# Patient Record
Sex: Male | Born: 1945 | Race: White | Hispanic: No | Marital: Married | Smoking: Former smoker
Health system: Southern US, Community
[De-identification: ages and names within clinical notes are randomized; demographics above are authoritative.]

## PROBLEM LIST (undated history)

## (undated) DIAGNOSIS — N529 Male erectile dysfunction, unspecified: Secondary | ICD-10-CM

## (undated) DIAGNOSIS — M109 Gout, unspecified: Secondary | ICD-10-CM

## (undated) DIAGNOSIS — M1990 Unspecified osteoarthritis, unspecified site: Secondary | ICD-10-CM

## (undated) DIAGNOSIS — N189 Chronic kidney disease, unspecified: Secondary | ICD-10-CM

## (undated) DIAGNOSIS — E119 Type 2 diabetes mellitus without complications: Secondary | ICD-10-CM

## (undated) DIAGNOSIS — E785 Hyperlipidemia, unspecified: Secondary | ICD-10-CM

## (undated) DIAGNOSIS — M549 Dorsalgia, unspecified: Secondary | ICD-10-CM

## (undated) DIAGNOSIS — K219 Gastro-esophageal reflux disease without esophagitis: Secondary | ICD-10-CM

## (undated) DIAGNOSIS — IMO0001 Reserved for inherently not codable concepts without codable children: Secondary | ICD-10-CM

## (undated) DIAGNOSIS — G9340 Encephalopathy, unspecified: Secondary | ICD-10-CM

## (undated) DIAGNOSIS — I10 Essential (primary) hypertension: Secondary | ICD-10-CM

## (undated) DIAGNOSIS — F39 Unspecified mood [affective] disorder: Secondary | ICD-10-CM

## (undated) DIAGNOSIS — R413 Other amnesia: Secondary | ICD-10-CM

## (undated) DIAGNOSIS — I639 Cerebral infarction, unspecified: Secondary | ICD-10-CM

## (undated) DIAGNOSIS — I714 Abdominal aortic aneurysm, without rupture: Secondary | ICD-10-CM

## (undated) DIAGNOSIS — M2550 Pain in unspecified joint: Secondary | ICD-10-CM

## (undated) DIAGNOSIS — R911 Solitary pulmonary nodule: Secondary | ICD-10-CM

## (undated) HISTORY — DX: Reserved for inherently not codable concepts without codable children: IMO0001

## (undated) HISTORY — DX: Chronic kidney disease, unspecified: N18.9

## (undated) HISTORY — DX: Unspecified osteoarthritis, unspecified site: M19.90

## (undated) HISTORY — DX: Male erectile dysfunction, unspecified: N52.9

## (undated) HISTORY — DX: Dorsalgia, unspecified: M54.9

## (undated) HISTORY — DX: Type 2 diabetes mellitus without complications: E11.9

## (undated) HISTORY — DX: Pain in unspecified joint: M25.50

## (undated) HISTORY — PX: VASECTOMY: SHX75

## (undated) HISTORY — DX: Abdominal aortic aneurysm, without rupture: I71.4

## (undated) HISTORY — DX: Gastro-esophageal reflux disease without esophagitis: K21.9

## (undated) HISTORY — DX: Cerebral infarction, unspecified: I63.9

## (undated) HISTORY — DX: Solitary pulmonary nodule: R91.1

## (undated) HISTORY — DX: Unspecified mood (affective) disorder: F39

## (undated) HISTORY — DX: Encephalopathy, unspecified: G93.40

## (undated) HISTORY — DX: Gout, unspecified: M10.9

## (undated) HISTORY — DX: Other amnesia: R41.3

## (undated) HISTORY — PX: TIBIA FRACTURE SURGERY: SHX806

## (undated) HISTORY — DX: Hyperlipidemia, unspecified: E78.5

## (undated) HISTORY — DX: Essential (primary) hypertension: I10

## (undated) HISTORY — PX: POLYPECTOMY: SHX149

---

## 1967-07-03 HISTORY — PX: MANDIBLE FRACTURE SURGERY: SHX706

## 1968-07-02 HISTORY — PX: WRIST FRACTURE SURGERY: SHX121

## 2006-07-02 DIAGNOSIS — R911 Solitary pulmonary nodule: Secondary | ICD-10-CM

## 2006-07-02 HISTORY — DX: Solitary pulmonary nodule: R91.1

## 2007-08-19 ENCOUNTER — Ambulatory Visit: Payer: Self-pay | Admitting: Vascular Surgery

## 2008-07-02 HISTORY — PX: COLONOSCOPY: SHX174

## 2008-08-17 ENCOUNTER — Ambulatory Visit: Payer: Self-pay | Admitting: Vascular Surgery

## 2009-08-24 ENCOUNTER — Ambulatory Visit: Payer: Self-pay | Admitting: Vascular Surgery

## 2010-03-10 ENCOUNTER — Ambulatory Visit: Payer: Self-pay | Admitting: Vascular Surgery

## 2010-09-12 ENCOUNTER — Encounter (INDEPENDENT_AMBULATORY_CARE_PROVIDER_SITE_OTHER): Payer: Medicare Other

## 2010-09-12 ENCOUNTER — Ambulatory Visit (INDEPENDENT_AMBULATORY_CARE_PROVIDER_SITE_OTHER): Payer: Medicare Other | Admitting: Vascular Surgery

## 2010-09-12 DIAGNOSIS — I714 Abdominal aortic aneurysm, without rupture, unspecified: Secondary | ICD-10-CM

## 2010-09-12 NOTE — Assessment & Plan Note (Signed)
OFFICE VISIT  RAYBURN, MUNDIS DOB:  03-19-1946                                       09/12/2010 ZOXWR#:[id number]  Patient is a 66 year old male patient followed by me for an infrarenal abdominal aortic aneurysm.  I have followed him for the past 2 years, and the aneurysm has very slowly enlarged.  He denies any abdominal or back symptoms.  Aneurysm was originally picked up on the CT scan done for disk problems, which have subsequently resolved.  CHRONIC MEDICAL PROBLEMS: 1. Hypertension. 2. Hyperlipidemia. 3. History of potential herniated disk, which did not require     treatment. 4. Negative for diabetes, coronary artery disease, COPD, or stroke.  SOCIAL HISTORY:  He is single and has 2 children.  Is retired.  Has not smoked since 1985.  Drinks occasional alcohol.  FAMILY HISTORY:  Positive for coronary artery disease in 2 brothers, diabetes and stroke in his father.  REVIEW OF SYSTEMS:  Negative for chest pain, dyspnea on exertion, PND, orthopnea.  Does have reflux esophagitis, arthritis, and joint pain. All other systems were negative for the complete review of systems.  PHYSICAL EXAMINATION:  Blood pressure 148/80, heart rate 78, respirations 14.  General:  He is a well-developed and well-nourished male in no apparent distress, alert and oriented x3.  HEENT:  Normal for age.  EOMs intact.  Lungs:  Clear to auscultation.  No rhonchi or wheezing.  Cardiovascular:  Regular rhythm.  No murmurs.  Carotid pulses are 3+.  No audible bruits.  Abdomen:  Soft, nontender.  No pulsatile masses noted.  Musculoskeletal:  Free of major deformities.  Neurologic: Normal.  Lower extremity exam reveals 3+ femoral and popliteal pulses palpable bilaterally.  Today I ordered an abdominal duplex scan which I have reviewed and interpreted.  This reveals the aneurysm to have a maximal diameter of 4.6 cm compared to 4.3 cm in September 2011.  The aneurysm is slowly  enlarging, and we will need to keep an eye on it. It does not require treatment at the present time.  He will return in 6 months for a duplex scan of his aneurysm to check for further enlargement.    Quita Skye Hart [place], M.D. Electronically Signed  JDL/MEDQ  D:  09/12/2010  T:  09/12/2010  Job:  [id number]  cc:   Dr. Zachery Dauer

## 2010-09-18 NOTE — Procedures (Unsigned)
DUPLEX ULTRASOUND OF ABDOMINAL AORTA  INDICATION:  Abdominal aortic aneurysm.  HISTORY: Diabetes:  No. Cardiac:  No. Hypertension:  Yes. Smoking:  Previous. Connective Tissue Disorder: Family History:  No. Previous Surgery:  No.  DUPLEX EXAM:         AP (cm)                   TRANSVERSE (cm) Proximal             Not visualized            Not visualized Mid                  2.4 cm                    2.8 cm Distal               4.6 cm                    4.6 cm Right Iliac          1.4 cm                    1.3 cm Left Iliac           1.4 cm                    1.4 cm  PREVIOUS:  Date:  03/10/2010  AP:  4.26  TRANSVERSE:  4.33  IMPRESSION: 1. Aneurysmal dilatation of the distal abdominal aorta with no     significant change in maximum diameter when compared to the     previous exam. 2. Unable to adequately visualize the proximal abdominal aorta due to     overlying bowel gas patterns.  ___________________________________________ Quita Skye Hart [place], M.D.  CH/MEDQ  D:  09/12/2010  T:  09/12/2010  Job:  [id number]

## 2010-11-14 NOTE — Procedures (Signed)
DUPLEX ULTRASOUND OF ABDOMINAL AORTA   INDICATION:  Followup abdominal aortic aneurysm.   HISTORY:  Diabetes:  No.  Cardiac:  No.  Hypertension:  Yes.  Smoking:  Quit.  Connective Tissue Disorder:  Family History:  No.  Previous Surgery:  No.   DUPLEX EXAM:         AP (cm)                   TRANSVERSE (cm)  Proximal             1.69 cm                   1.73 cm  Mid                  4.22 cm                   4.19 cm  Distal               2.45 cm                   2.54 cm  Right Iliac          1.25 cm                   0.98 cm  Left Iliac           1.21 cm                   1.26 cm   PREVIOUS:  Date:  AP:  3.87  TRANSVERSE:  3.95   IMPRESSION:  There appears to be abdominal aortic aneurysm noted with  largest measurement of 4.22 cm x 4.19 cm.   ___________________________________________  Quita Skye Hart [place], M.D.   CB/MEDQ  D:  08/24/2009  T:  08/24/2009  Job:  [id number]

## 2010-11-14 NOTE — Procedures (Signed)
DUPLEX ULTRASOUND OF ABDOMINAL AORTA   INDICATION:  Follow up abdominal aortic aneurysm.   HISTORY:  Diabetes:  No.  Cardiac:  No.  Hypertension:  Yes.  Smoking:  Quit 20 years ago.  Connective Tissue Disorder:  Family History:  No.  Previous Surgery:  No.   DUPLEX EXAM:         AP (cm)                   TRANSVERSE (cm)  Proximal             1.86 cm                   1.98 cm  Mid                  3.87 cm                   3.95 cm  Distal               2.96 cm                   3.20 cm  Right Iliac          1.34 cm                   1.33 cm  Left Iliac           1.61 cm                   1.56 cm   PREVIOUS:  Date: 07/02/07 (CT)  AP:  3.9  TRANSVERSE:   IMPRESSION:  Stable abdominal aortic aneurysm measuring 3.87 cm X 3.95  cm.   _______________________________________   [name] [name], M.D.   AS/MEDQ  D:  08/17/2008  T:  08/17/2008  Job:  [id number]

## 2010-11-14 NOTE — Consult Note (Signed)
VASCULAR SURGERY CONSULTATION   [name], [name]  DOB:  07/25/1945                                       08/19/2007  ZOXWR#:[id number]   Patient is a 65 year old healthy male patient who was referred for  vascular consultation by Dr. Welton [name] for abdominal aortic aneurysm, which  was recently discovered on CT scanning.  Since November, patient had  upper back discomfort which would occur without notice and was quite  severe in nature.  This would sometimes occur while sitting or when he  arose, but he could do other vigorous activity with no pain.  Workup  included a CT scan, which did reveal some herniation of the L4-5 disk  area and also revealed a 3.9 cm infrarenal abdominal aortic aneurysm.  Subsequently, his back discomfort has resolved, and he is having no  symptoms at this time.  He has no history of lumbar disk disease,  radiculopathy, or vascular problems.   PAST MEDICAL HISTORY:  1. Hypertension.  2. Hyperlipidemia.  3. Questionable L4-5 disk herniation on recent CT scan, negative for      coronary artery disease, diabetes, COPD, or stroke.   PAST SURGICAL HISTORY:  None.   FAMILY HISTORY:  Positive for coronary artery disease in his mother and  brother, who died at age 16 with a myocardial infarction.  Also,  positive for stroke in his father and diabetes in his sister and his  father.   SOCIAL HISTORY:  He is single.  Has two children.  Works as a Programmer, systems.  He has not smoked since 1989.  Drinks 1-2 alcoholic  beverages per day.   REVIEW OF SYSTEMS:  Totally unremarkable with the exception of some  reflux esophagitis.   ALLERGIES:  None known.   MEDICATIONS:  1. Crestor 20 mg 1 daily.  2. Nexium 40 mg 1 daily.  3. Furosemide 40 mg 1 daily.  4. Metoprolol tartrate 50 mg 1 tablet 2 times per day.   PHYSICAL EXAMINATION:  Blood pressure 142/86, heart rate 60,  respirations 18.  Generally, he is a healthy-appearing middle-aged male  in  no apparent distress.  Alert and oriented x3.  His neck is supple  with 3+ carotid pulses palpable.  No bruits are audible.  Neurologic  exam is normal.  No palpable adenopathy in the neck.  Upper extremity  pulses are 3+ bilaterally.  Chest:  Clear to auscultation.  Cardiovascular:  Regular rhythm with no murmurs.  No skin rash is noted.  Abdomen is soft and nontender with a small pulsatile mass in the mid  epigastrium.  No organomegaly is noted.  He has 3+ femoral, popliteal,  and dorsalis pedis pulses palpable bilaterally.  There is no distal  edema.   I reviewed the CT scan performed at [facility] on 07/02/07, and  he does have an infrarenal abdominal aortic aneurysm measuring about 4  cm in maximum diameter.  It does appear to have an adequate neck below  the renals for stent grafting.  If that should occur, need to be done in  the future.   I discussed with him the fact that we will see him back in one year with  a follow-up duplex scan of his aneurysm in our office for followup.  If  he has any severe back or abdominal pain, he will report to the  emergency room.   Quita Skye Hart [place], M.D.  Electronically Signed  JDL/MEDQ  D:  08/19/2007  T:  08/20/2007  Job:  [id number]   cc:   Dr. Luna [name], [name] [name]

## 2010-11-14 NOTE — Assessment & Plan Note (Signed)
OFFICE VISIT   [name] [name], [name] [name]  DOB:  1946/05/01                                       08/17/2008  AOZHY#:[id number]   The patient returns today for followup regarding his abdominal aortic  aneurysm which I evaluated a year ago.  It was 3.9 cm in maximum  diameter by CT scan in December of 2008.  He has had no abdominal or  back symptoms in the interim.  He also denies any chest pain, dyspnea on  exertion, PND, orthopnea, hemiparesis, aphasia, amaurosis fugax,  diplopia, blurred vision, syncope or claudication.  He does not take  aspirin or other medications other than Prilosec.   PHYSICAL EXAM:  Vital signs:  Blood pressure is 146/88, heart rate 61,  respirations 14.  Neck:  His carotid pulse is 3+, no audible bruits.  Neurological:  Normal.  Chest:  Clear to auscultation.  Cardiovascular:  Regular rhythm, no murmurs.  Abdomen:  Soft, nontender, no pulsatile  masses palpable.  He has 3+ femoral and posterior tibial pulses are  palpable at 3+.   Duplex scan of his aneurysm reveals no change since last year measuring  3.95 cm in maximum diameter.   His aneurysm is unchanged, does not require any treatment at this time.  We will see him back on our protocol scan in 1 year to check for any  enlargement unless he develops any symptoms in the interim.   Quita Skye Hart [place], M.D.  Electronically Signed   JDL/MEDQ  D:  08/17/2008  T:  08/18/2008  Job:  [id number]   cc:   Dr Luna Kitchens

## 2010-11-14 NOTE — Procedures (Signed)
DUPLEX ULTRASOUND OF ABDOMINAL AORTA   INDICATION:  Followup of known abdominal aortic aneurysm.   HISTORY:  Diabetes:  No  Cardiac:  No  Hypertension:  Yes  Smoking:  Previous  Family History:  Unknown  Previous Surgery:  None   DUPLEX EXAM:         AP (cm)                   TRANSVERSE (cm)  Proximal             1.86 cm                   1.89 cm  Mid                  4.26 cm                   4.33 cm  Distal               1.96 cm                   1.99 cm  Right Iliac          1.19 cm                   1.01 cm  Left Iliac           1.22 cm   PREVIOUS:  Date: 08/24/2009  AP:  4.22  TRANSVERSE:  4.19   IMPRESSION:  1. Abdominal aortic aneurysm with maximum diameter of 4.26 cm x 4.33      cm.  2. No significant change from previous exam of 08/24/2009.   ___________________________________________  Quita Skye. Hart [place], M.D.   RS/MEDQ  D:  03/13/2010  T:  03/13/2010  Job:  [id number]

## 2011-03-15 ENCOUNTER — Encounter (INDEPENDENT_AMBULATORY_CARE_PROVIDER_SITE_OTHER): Payer: Medicare Other | Admitting: *Deleted

## 2011-03-15 DIAGNOSIS — I714 Abdominal aortic aneurysm, without rupture: Secondary | ICD-10-CM

## 2011-03-21 ENCOUNTER — Encounter: Payer: Self-pay | Admitting: Vascular Surgery

## 2011-03-21 NOTE — Procedures (Unsigned)
DUPLEX ULTRASOUND OF ABDOMINAL AORTA  INDICATION:  Abdominal aortic aneurysm.  HISTORY: Diabetes:  No. Cardiac:  No. Hypertension:  Yes. Smoking:  Previous. Connective Tissue Disorder: Family History:  No. Previous Surgery:  No.  DUPLEX EXAM:         AP (cm)                   TRANSVERSE (cm) Proximal             2.9 cm                    2.97 cm Mid                  Not visualized            Not visualized Distal               4.7 cm                    4.7 cm Right Iliac          1.4 cm                    1.3 cm Left Iliac           1.3 cm                    1.2 cm  PREVIOUS:  Date: 09/12/2010  AP:  4.6  TRANSVERSE:  4.6  IMPRESSION: 1. Aneurysmal dilatation of the distal abdominal aorta with no     significant change in maximum diameter when compared to the     previous examination. 2. Unable to adequately visualize the mid abdominal aorta due to     overlying bowel gas patterns.  ___________________________________________ Quita Skye Hart Rochester, M.D.  CH/MEDQ  D:  03/15/2011  T:  03/15/2011  Job:  [id number]

## 2011-03-22 ENCOUNTER — Other Ambulatory Visit: Payer: Self-pay | Admitting: Vascular Surgery

## 2011-03-22 DIAGNOSIS — I714 Abdominal aortic aneurysm, without rupture: Secondary | ICD-10-CM

## 2011-09-12 ENCOUNTER — Encounter (INDEPENDENT_AMBULATORY_CARE_PROVIDER_SITE_OTHER): Payer: Medicare Other | Admitting: *Deleted

## 2011-09-12 DIAGNOSIS — I714 Abdominal aortic aneurysm, without rupture: Secondary | ICD-10-CM

## 2011-09-19 ENCOUNTER — Other Ambulatory Visit: Payer: Self-pay | Admitting: *Deleted

## 2011-09-19 DIAGNOSIS — I714 Abdominal aortic aneurysm, without rupture: Secondary | ICD-10-CM

## 2011-09-21 ENCOUNTER — Encounter: Payer: Self-pay | Admitting: Vascular Surgery

## 2011-09-21 NOTE — Procedures (Unsigned)
DUPLEX ULTRASOUND OF ABDOMINAL AORTA  INDICATION:  Follow up abdominal aortic aneurysm.  HISTORY: Diabetes:  No. Cardiac:  No. Hypertension:  Yes. Smoking:  Previously. Connective Tissue Disorder: Family History: Previous Surgery:  No.  DUPLEX EXAM:         AP (cm)                   TRANSVERSE (cm) Proximal             1.8 cm Mid                  4.6 cm                    4.6 cm Distal               2.2 cm                    1.8 cm Right Iliac          1.1 cm                    0.98 cm Left Iliac           1.3 cm                    1.3 cm  PREVIOUS:  Date: 03/15/11  AP:  4.7  TRANSVERSE:  4.7  IMPRESSION: 1. Aneurysmal dilatation of the mid to distal aorta with no     significant change since the prior study of 03/15/2011. 2. This was somewhat of a technically difficult examination due to     overlying bowel gas.  ___________________________________________ Quita Skye. Hart Rochester, M.D.  SS/MEDQ  D:  09/12/2011  T:  09/12/2011  Job:  [id number]

## 2012-03-11 ENCOUNTER — Encounter: Payer: Self-pay | Admitting: Neurosurgery

## 2012-03-14 ENCOUNTER — Ambulatory Visit: Payer: Medicare Other | Admitting: Neurosurgery

## 2012-03-17 ENCOUNTER — Encounter: Payer: Self-pay | Admitting: Neurosurgery

## 2012-03-18 ENCOUNTER — Ambulatory Visit (INDEPENDENT_AMBULATORY_CARE_PROVIDER_SITE_OTHER): Payer: Medicare Other | Admitting: Neurosurgery

## 2012-03-18 ENCOUNTER — Other Ambulatory Visit: Payer: Self-pay | Admitting: *Deleted

## 2012-03-18 ENCOUNTER — Encounter (INDEPENDENT_AMBULATORY_CARE_PROVIDER_SITE_OTHER): Payer: Medicare Other | Admitting: *Deleted

## 2012-03-18 ENCOUNTER — Encounter: Payer: Self-pay | Admitting: Neurosurgery

## 2012-03-18 VITALS — BP 159/90 | HR 63 | Resp 16 | Ht 69.0 in | Wt 199.0 lb

## 2012-03-18 DIAGNOSIS — I714 Abdominal aortic aneurysm, without rupture, unspecified: Secondary | ICD-10-CM | POA: Insufficient documentation

## 2012-03-18 HISTORY — DX: Abdominal aortic aneurysm, without rupture, unspecified: I71.40

## 2012-03-18 HISTORY — DX: Abdominal aortic aneurysm, without rupture: I71.4

## 2012-03-18 NOTE — Progress Notes (Signed)
[facility] [place] AAA/PAD/PVD Office Note  CC: Six-month surveillance of known AAA Referring Physician: Hart Rochester  History of Present Illness: 66 year old male patient of Dr. Hart Rochester is followed for reevaluation of known AAA. The patient denies any unusual abdominal or back pain. The patient does give a history of having back pain and had an MRI for the back pain which revealed a AAA but states he has not had further back issues.  Past Medical History  Diagnosis Date  . AAA (abdominal aortic aneurysm)   . Hypertension   . Hyperlipidemia   . Reflux   . Arthritis     Gout  . Back pain     ROS: [x]  Positive   [ ]  Denies    General: [ ]  Weight loss, [ ]  Fever, [ ]  chills Neurologic: [ ]  Dizziness, [ ]  Blackouts, [ ]  Seizure [ ]  Stroke, [ ]  "Mini stroke", [ ]  Slurred speech, [ ]  Temporary blindness; [ ]  weakness in arms or legs, [ ]  Hoarseness Cardiac: [ ]  Chest pain/pressure, [ ]  Shortness of breath at rest [ ]  Shortness of breath with exertion, [ ]  Atrial fibrillation or irregular heartbeat Vascular: [ ]  Pain in legs with walking, [ ]  Pain in legs at rest, [ ]  Pain in legs at night,  [ ]  Non-healing ulcer, [ ]  Blood clot in vein/DVT,   Pulmonary: [ ]  Home oxygen, [ ]  Productive cough, [ ]  Coughing up blood, [ ]  Asthma,  [ ]  Wheezing Musculoskeletal:  [ ]  Arthritis, [ ]  Low back pain, [ ]  Joint pain Hematologic: [ ]  Easy Bruising, [ ]  Anemia; [ ]  Hepatitis Gastrointestinal: [ ]  Blood in stool, [ ]  Gastroesophageal Reflux/heartburn, [ ]  Trouble swallowing Urinary: [ ]  chronic Kidney disease, [ ]  on HD - [ ]  MWF or [ ]  TTHS, [ ]  Burning with urination, [ ]  Difficulty urinating Skin: [ ]  Rashes, [ ]  Wounds Psychological: [ ]  Anxiety, [ ]  Depression   Social History History  Substance Use Topics  . Smoking status: Former Smoker    Types: Cigarettes    Quit date: 07/03/1983  . Smokeless tobacco: Not on file  . Alcohol Use: Yes    Family History Family  History  Problem Relation Age of Onset  . Diabetes Father   . Stroke Father   . Heart attack Brother     X's 2  . Coronary artery disease Mother   . Diabetes Sister     Not on File  Current Outpatient Prescriptions  Medication Sig Dispense Refill  . allopurinol (ZYLOPRIM) 100 MG tablet Take 100 mg by mouth daily.      . furosemide (LASIX) 40 MG tablet Take 40 mg by mouth daily.      Marland Kitchen HYDROcodone-acetaminophen (NORCO/VICODIN) 5-325 MG per tablet continuous as needed.      . metoprolol succinate (TOPROL-XL) 50 MG 24 hr tablet Take 50 mg by mouth daily. Take with or immediately following a meal.      . METOPROLOL TARTRATE PO Take 50 mg by mouth daily.      Marland Kitchen omeprazole (PRILOSEC) 20 MG capsule Take 20 mg by mouth daily.      Marland Kitchen PRILOSEC OTC 20 MG tablet daily.      . rosuvastatin (CRESTOR) 20 MG tablet Take 20 mg by mouth daily.        Physical Examination  Filed Vitals:   03/18/12 0917  BP: 159/90  Pulse: 63  Resp: 16    Body  mass index is 29.39 kg/(m^2).  General:  WDWN in NAD Gait: Normal HEENT: WNL Eyes: Pupils equal Pulmonary: normal non-labored breathing , without Rales, rhonchi,  wheezing Cardiac: RRR, without  Murmurs, rubs or gallops; No carotid bruits Abdomen: soft, NT, no masses Skin: no rashes, ulcers noted Vascular Exam/Pulses: 2+ radial pulses bilaterally, palpable femoral pulses bilaterally, mild palpable abdominal pulsatile mass  Extremities without ischemic changes, no Gangrene , no cellulitis; no open wounds;  Musculoskeletal: no muscle wasting or atrophy  Neurologic: A&O X 3; Appropriate Affect ; SENSATION: normal; MOTOR FUNCTION:  moving all extremities equally. Speech is fluent/normal  Non-Invasive Vascular Imaging: AAA duplex shows a maximum diameter of 4.56 x 4.57 which is virtually unchanged from 4.6 in March 2013  ASSESSMENT/PLAN: Asymptomatic patient with known AAA which is unchanged from previous exam. The patient will followup in 6 months  with repeat AAA duplex. The patient's questions were encouraged and answered, he is in agreement with this plan. Sharlee Blew ANP  Clinic M.D.: Hart Rochester

## 2012-03-19 ENCOUNTER — Ambulatory Visit: Payer: Medicare Other | Admitting: Neurosurgery

## 2012-09-16 ENCOUNTER — Ambulatory Visit: Payer: Medicare Other | Admitting: Neurosurgery

## 2012-09-16 ENCOUNTER — Encounter (INDEPENDENT_AMBULATORY_CARE_PROVIDER_SITE_OTHER): Payer: Medicare Other | Admitting: *Deleted

## 2012-09-16 DIAGNOSIS — I714 Abdominal aortic aneurysm, without rupture: Secondary | ICD-10-CM

## 2012-09-17 ENCOUNTER — Encounter: Payer: Self-pay | Admitting: Vascular Surgery

## 2012-09-17 ENCOUNTER — Other Ambulatory Visit: Payer: Self-pay

## 2012-09-17 DIAGNOSIS — I714 Abdominal aortic aneurysm, without rupture: Secondary | ICD-10-CM

## 2013-03-31 ENCOUNTER — Other Ambulatory Visit: Payer: Medicare Other

## 2013-03-31 ENCOUNTER — Ambulatory Visit: Payer: Medicare Other | Admitting: Vascular Surgery

## 2013-04-06 ENCOUNTER — Encounter: Payer: Self-pay | Admitting: Vascular Surgery

## 2013-04-07 ENCOUNTER — Ambulatory Visit (INDEPENDENT_AMBULATORY_CARE_PROVIDER_SITE_OTHER): Payer: Medicare Other | Admitting: Vascular Surgery

## 2013-04-07 ENCOUNTER — Ambulatory Visit (HOSPITAL_COMMUNITY)
Admission: RE | Admit: 2013-04-07 | Discharge: 2013-04-07 | Disposition: A | Discharge status: Home / Self Care | Payer: Medicare Other | Source: Ambulatory Visit | Attending: Vascular Surgery | Admitting: Vascular Surgery

## 2013-04-07 ENCOUNTER — Encounter: Payer: Self-pay | Admitting: Vascular Surgery

## 2013-04-07 VITALS — BP 157/98 | HR 69 | Resp 16 | Ht 69.0 in | Wt 200.0 lb

## 2013-04-07 DIAGNOSIS — I714 Abdominal aortic aneurysm, without rupture, unspecified: Secondary | ICD-10-CM | POA: Insufficient documentation

## 2013-04-07 DIAGNOSIS — Z01810 Encounter for preprocedural cardiovascular examination: Secondary | ICD-10-CM

## 2013-04-07 HISTORY — DX: Abdominal aortic aneurysm, without rupture, unspecified: I71.40

## 2013-04-07 HISTORY — DX: Abdominal aortic aneurysm, without rupture: I71.4

## 2013-04-07 NOTE — Addendum Note (Signed)
Addended by: Sharee Pimple on: 04/07/2013 12:40 PM   Modules accepted: Orders

## 2013-04-07 NOTE — Progress Notes (Signed)
Subjective:     Patient ID: [name] [name], male   DOB: 05-11-1946, 67 y.o.   MRN: [id number]  HPI this 67 year old male returns for continued followup regarding his abnormal aortic aneurysm which I have been following for about 3 years. It was discovered on a CT scan when he was being evaluated for lumbar spine disease. Most recent diameter was 4.69 cm in maximum diameter. Today he had a duplex scan in our office which I have reviewed and interpreted. It reveals that the aneurysm is now 5.2 x 5.1 cm in maximum diameter. He denies any new symptoms of abdominal or back discomfort. He has no history of coronary artery disease and has not smoked since 1985.  Past Medical History  Diagnosis Date  . AAA (abdominal aortic aneurysm)   . Hypertension   . Hyperlipidemia   . Reflux   . Arthritis     Gout  . Back pain     History  Substance Use Topics  . Smoking status: Former Smoker    Types: Cigarettes    Quit date: 07/03/1983  . Smokeless tobacco: Not on file  . Alcohol Use: Yes    Family History  Problem Relation Age of Onset  . Diabetes Father   . Stroke Father   . Heart attack Brother     X's 2  . Coronary artery disease Mother   . Diabetes Sister     No Known Allergies  Current outpatient prescriptions:allopurinol (ZYLOPRIM) 100 MG tablet, Take 100 mg by mouth daily., Disp: , Rfl: ;  furosemide (LASIX) 40 MG tablet, Take 40 mg by mouth daily., Disp: , Rfl: ;  HYDROcodone-acetaminophen (NORCO/VICODIN) 5-325 MG per tablet, continuous as needed., Disp: , Rfl: ;  metoprolol succinate (TOPROL-XL) 50 MG 24 hr tablet, Take 50 mg by mouth daily. Take with or immediately following a meal., Disp: , Rfl:  METOPROLOL TARTRATE PO, Take 50 mg by mouth daily., Disp: , Rfl: ;  omeprazole (PRILOSEC) 20 MG capsule, Take 20 mg by mouth daily., Disp: , Rfl: ;  PRILOSEC OTC 20 MG tablet, daily., Disp: , Rfl: ;  rosuvastatin (CRESTOR) 20 MG tablet, Take 20 mg by mouth daily., Disp: , Rfl:   BP 157/98   Pulse 69  Resp 16  Ht 5\' 9"  (1.753 m)  Wt 200 lb (90.719 kg)  BMI 29.52 kg/m2  Body mass index is 29.52 kg/(m^2).        Review of Systems denies chest pain, dyspnea on exertion, PND, orthopnea, hemoptysis, claudication. All other systems negative and a complete review of systems    Objective:   Physical Exam BP 157/98  Pulse 69  Resp 16  Ht 5\' 9"  (1.753 m)  Wt 200 lb (90.719 kg)  BMI 29.52 kg/m2  Gen.-alert and oriented x3 in no apparent distress HEENT normal for age Lungs no rhonchi or wheezing Cardiovascular regular rhythm no murmurs carotid pulses 3+ palpable no bruits audible Abdomen soft nontender -5 cm pulsatile mass palpable-nontender Musculoskeletal free of  major deformities Skin clear -no rashes Neurologic normal Lower extremities 3+ femoral and dorsalis pedis pulses palpable bilaterally with no edema  Today I reviewed the duplex scan which reveals a continued enlargement of the aneurysm to her current diameter of 5.2 cm in maximum diameter       Assessment:     5.2 cm infrarenal abdominal aortic aneurysm     Plan:     #1 CT angiogram of abdomen and pelvis to see if patient stent graft candidate #  2 Will order Cardiolite nuclear stress test-patient has no history of coronary artery disease #3 return in 2 weeks to discuss options regarding treatment

## 2013-04-10 ENCOUNTER — Telehealth: Payer: Self-pay | Admitting: Vascular Surgery

## 2013-04-10 NOTE — Telephone Encounter (Signed)
notified patient of cardiolyte scheduled at [facility], [place], on 04-16-13 9 am, and cta abd/pelvis, [place], 04-21-13 at 11:30, then fu with dr. Hart Rochester at 12:30

## 2013-04-20 ENCOUNTER — Encounter: Payer: Self-pay | Admitting: Vascular Surgery

## 2013-04-21 ENCOUNTER — Ambulatory Visit (INDEPENDENT_AMBULATORY_CARE_PROVIDER_SITE_OTHER): Payer: Medicare Other | Admitting: Vascular Surgery

## 2013-04-21 ENCOUNTER — Ambulatory Visit
Admission: RE | Admit: 2013-04-21 | Discharge: 2013-04-21 | Disposition: A | Discharge status: Home / Self Care | Payer: Medicare Other | Source: Ambulatory Visit | Attending: Vascular Surgery | Admitting: Vascular Surgery

## 2013-04-21 ENCOUNTER — Encounter: Payer: Self-pay | Admitting: Vascular Surgery

## 2013-04-21 VITALS — BP 161/102 | HR 70 | Resp 18 | Ht 69.0 in | Wt 195.0 lb

## 2013-04-21 DIAGNOSIS — I714 Abdominal aortic aneurysm, without rupture, unspecified: Secondary | ICD-10-CM

## 2013-04-21 IMAGING — CT CT CTA ABD/PEL W/CM AND/OR W/O CM
1 of 7 series · 10 of 36 positions shown, 15 images · IV contrast (80CC OMNI 350)
Comparison: Abdominal aortic ultrasound - [DATE];

CLINICAL DATA: Evaluate abdominal aortic aneurysm, pre stent
evaluation

CT ANGIOGRAPHY ABDOMEN AND PELVIS
TECHNIQUE: Multidetector CT imaging of the abdomen and pelvis was
performed using the standard protocol during bolus administration
of intravenous contrast.  Multiplanar reconstructed images
including MIPs were obtained and reviewed to evaluate the vascular
anatomy.
Contrast: 80mL OMNIPAQUE IOHEXOL 350 MG/ML SOLN

[Series 3: angio · axial · 0.86mm/px · z∈[-405,+15]mm · 10 of 197 slices shown, 15 images]
[im 15/197  soft-tissue]
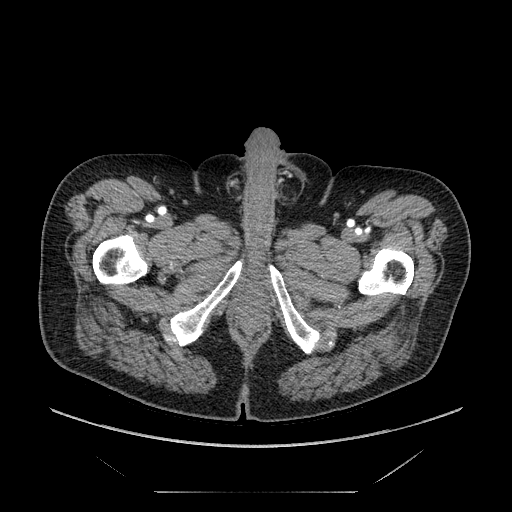
[im 15/197  bone]
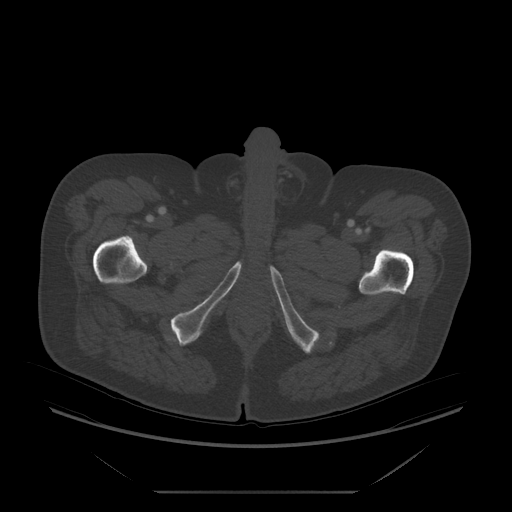
[im 43/197  soft-tissue]
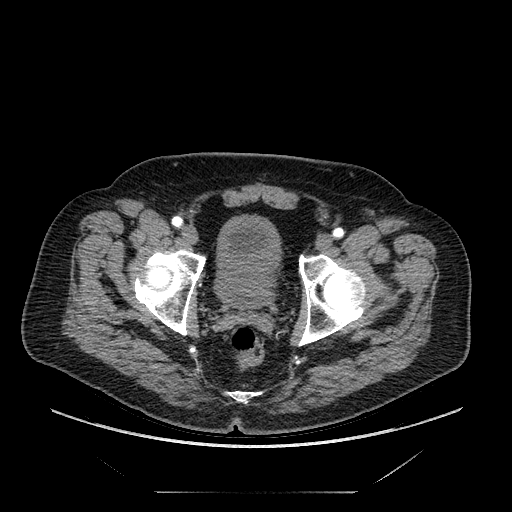
[im 57/197  soft-tissue]
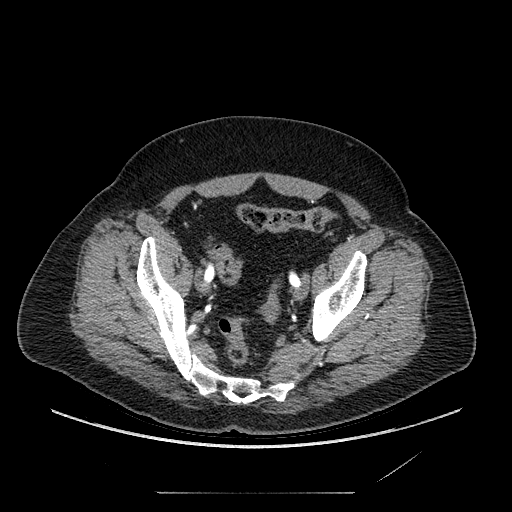
[im 85/197  soft-tissue]
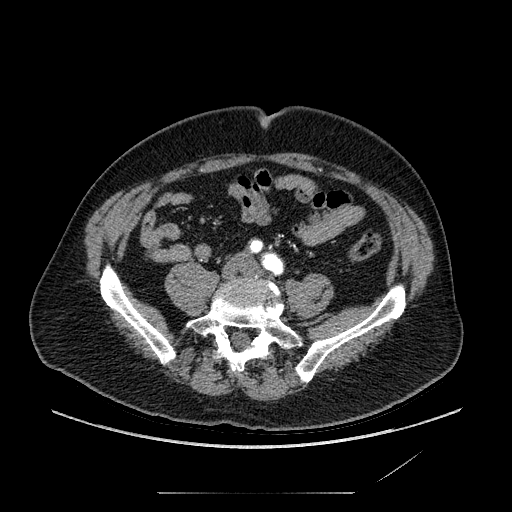
[im 99/197  soft-tissue]
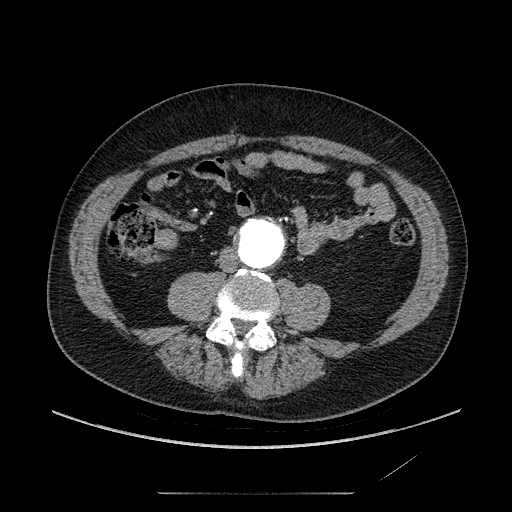
[im 113/197  soft-tissue]
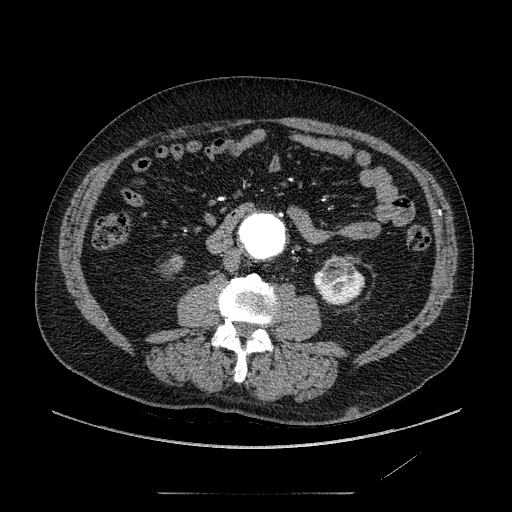
[im 141/197  soft-tissue]
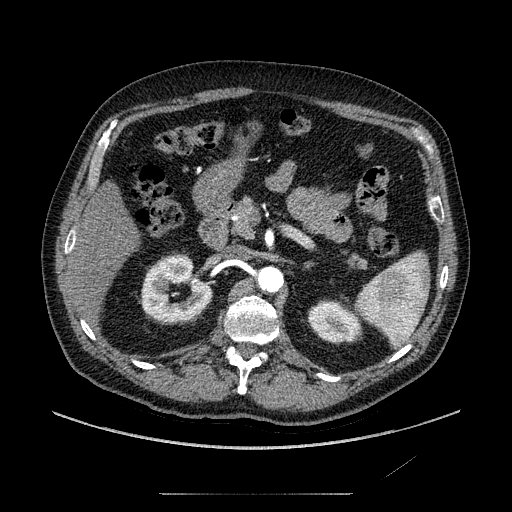
[im 141/197  lung]
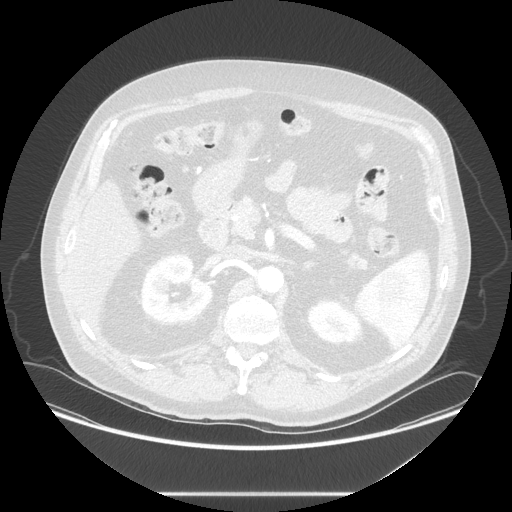
[im 155/197  soft-tissue]
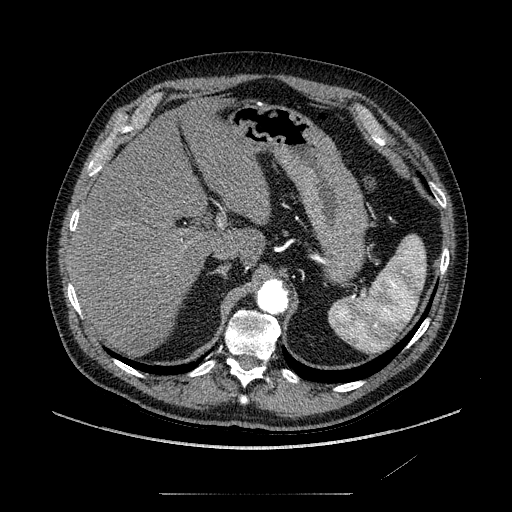
[im 155/197  lung]
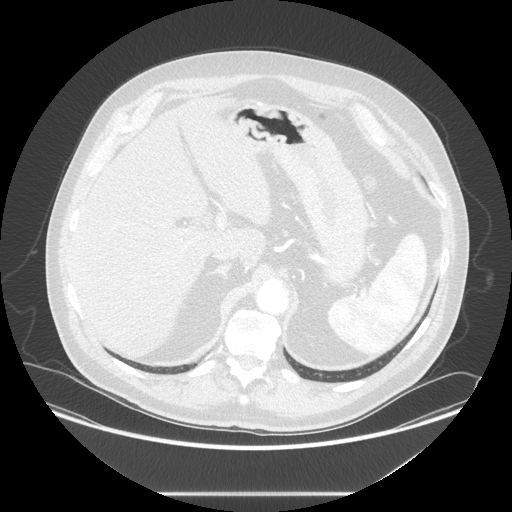
[im 169/197  lung]
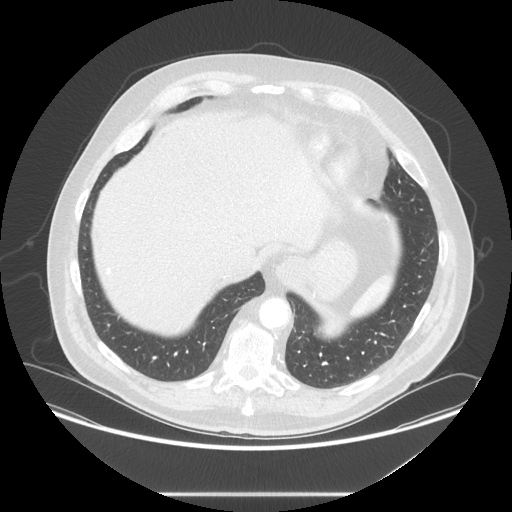
[im 183/197  soft-tissue]
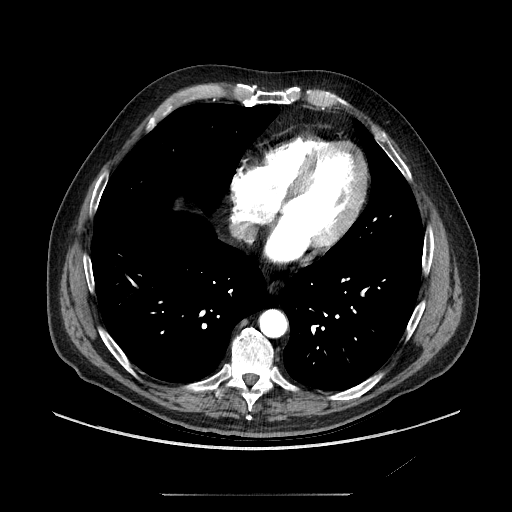
[im 183/197  lung]
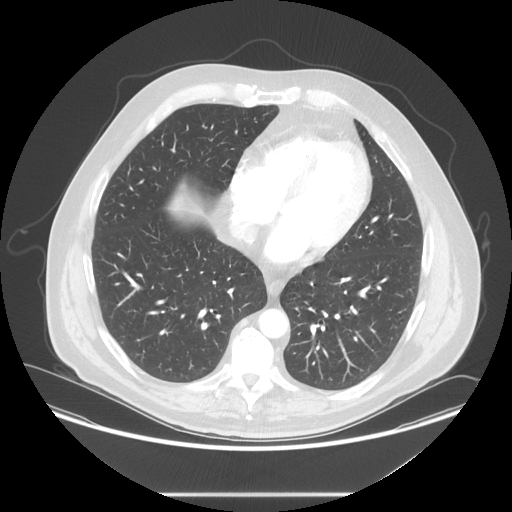
[im 183/197  bone]
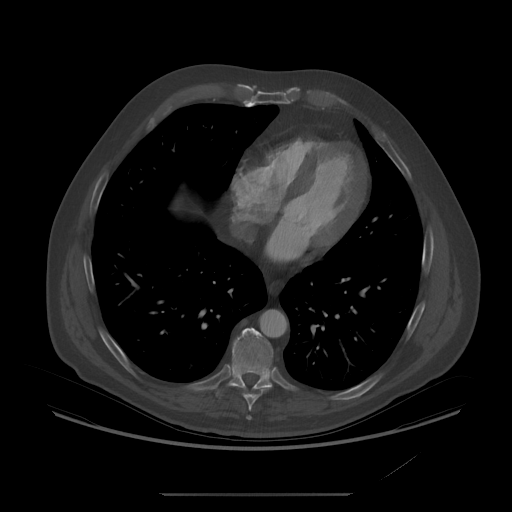

[10 of 36 positions shown; findings below may reference images not displayed]

abdominal CT
- [DATE]

Vascular Findings:

Thoracic aorta:  There is eccentric irregular largely noncalcified
plaque within the imaged distal aspect of the thoracic aorta, not
resulting in hemodynamically significant stenosis.  The imaged
distal aspect of the thoracic aorta is of normal caliber.

Abdominal aorta: Since prior abdominal CT performed [DATE], there
has been interval increase in size of infrarenal abdominal aortic
aneurysm, now measuring approximately 5.6 x 5.3 cm in greatest
oblique short axis axial dimension (image 94, series 3),
previously, 4.1 x 3.8 cm.  The abdominal aortic aneurysm now
measures approximately 5.3 cm in greatest oblique coronal dimension
(coronal image 67, series 601), previously, 3.7 cm.

The abdominal aortic aneurysm is noted to originate approximately
3.5 cm caudal to the takeoff of the most inferior left sided renal
artery and extend to the level of the aortic bifurcation.

There is eccentric largely noncalcified plaque within the abdominal
aorta, not resulting in hemodynamically significant stenosis.  No
abdominal aortic dissection or periaortic stranding. Several shoddy
retroperitoneal lymph nodes are noted about the abdominal aorta.

Celiac artery:  There is a minimal amount of eccentric
atherosclerotic plaque involving the cranial aspect of the origin
of celiac artery, not resulting in hemodynamically significant
stenosis.  Conventional branching pattern.

SMA:  There is a very minimal amount of primarily noncalcified
atherosclerotic plaque involving the cranial aspect of the origin
of the SMA, not resulting in hemodynamically significant stenosis.
A replaced right hepatic artery is noted to arise from the proximal
SMA.

Right renal artery:  Solitary; there is a mixture of calcified and
noncalcified atherosclerotic plaque involving the right renal
artery, not resulting in hemodynamically significant stenosis.
There is mild ectasia of the distal aspect of the right renal
artery regional to its bifurcation measuring approximately 1.1 x
0.9 cm in greatest oblique coronal dimension (image 81, series
601).  This finding without associated right-sided renal cortical
atrophy.

Left renal artery:  Previously duplicated as of a tiny accessory
left sided renal artery has thrombosed with associated geographic
atrophy involving the anterior inferior aspect of the left kidney.
There is a minimal amount of mixed calcified and noncalcified
atherosclerotic plaque involving the origin of the dominant right
sided renal artery, not resulting in hemodynamically significant
stenosis.

IMA:  Remains patent.

Right-sided pelvic vasculature: There is a very minimal amount of
eccentric mixed calcified and noncalcified plaque within the right
common iliac artery, not resulting in hemodynamically significant
stenosis. The right common iliac artery is a normal caliber.  The
right external iliac arteries tortuous but widely patent and free
of clinically significant narrowing. There is scattered
atherosclerotic plaque within a patent and normal caliber right
internal iliac artery.

Left-sided pelvic vasculature: There is a very minimal amount of
eccentric largely noncalcified atherosclerotic plaque scattered
within the normal caliber left common iliac artery, not resulting
in hemodynamically significant stenosis.  The left external iliac
arteries tortuous but of normal caliber and widely patent.  The
left scattered atherosclerotic plaque within a widely patent and
normal caliber left internal iliac artery.

Review of the MIP images confirms the above findings.

---------------------------------------------------

Nonvascular findings:

Normal hepatic contour. Calcification within the dome of the right
lobe of the liver (image [DATE] be the score of prior
granulomatous infection.  Normal appearance of the gallbladder
given degree distension.  No definite intra or extrahepatic biliary
duct dilatation.  No ascites.

There is symmetric enhancement and excretion of the bilateral
kidneys.  No definite renal stones on the post contrast
examination. There is geographic atrophy involving the anterior
aspect of the inferior pole of the left kidney (axial image 85,
series 3, coronal image 75, series 601) which is favored to be
vascular in etiology due to occlusion of an accessory left sided
renal artery.  No discrete renal lesions.  There is grossly
symmetric likely age related perinephric stranding.  No evidence of
urinary obstruction. There is mild thickening of the left adrenal
gland without discrete nodule.  Normal appearance of the right
adrenal gland, pancreas and spleen.

The bowel is normal in course and caliber without wall thickening
or evidence of obstruction.  Normal appearance of the appendix.  No
pneumoperitoneum, pneumatosis or portal venous gas.

Scattered shoddy retroperitoneal lymph nodes individually not
enlarged by size criteria with index left sided periaortic lymph
node measuring approximately 7 mm in diameter (image 82, series 3).
No retroperitoneal, pelvic, inguinal adenopathy.

Prostatic calcifications within a normal size prostate..  There is
mild diffuse thickening of the urinary bladder wall, possibly
secondary to under distension.  No free fluid in the pelvis.

Limited visualization of the lower thorax is negative for focal
airspace opacity or pleural effusion.  Normal heart size.  No
pericardial effusion.

No acute or aggressive osseous abnormalities.  There is ankylosis
of the L5 - S1 intervertebral disc space.  Bilateral facet
degenerative change within the lower lumbar spine.  Small left-
sided indirect mesenteric fat containing inguinal hernia.
IMPRESSION: 1.  Interval increase in size of infrarenal abdominal aortic
aneurysm, measuring approximately 5.6 cm in greatest dimension.

2.  Geographic atrophy involving the anterior aspect of the
inferior pole of the left kidney which is favored to be vascular in
etiology secondary to occlusion of a tiny accessory left sided
renal artery.  No evidence of urinary obstruction.

## 2013-04-21 MED ORDER — IOHEXOL 350 MG/ML SOLN
80.0000 mL | Freq: Once | INTRAVENOUS | Status: AC | PRN
  Administered 2013-04-21: 80 mL via INTRAVENOUS

## 2013-04-21 NOTE — Progress Notes (Signed)
Subjective:     Patient ID: [name] [name], male   DOB: 1946/04/23, 67 y.o.   MRN: [id number]  HPI this 67 year old male returns for continued evaluation regarding his abdominal aortic aneurysm. Today he had a CT angiogram which I have reviewed by computer. The aneurysm measures 5.6 cm in maximum diameter and does appear to be a suitable candidate for aortic stent grafting. Patient also had a Cardiolite performed and was found to have an ejection fraction of 50% with no evidence of ischemia.  Past Medical History  Diagnosis Date  . AAA (abdominal aortic aneurysm)   . Hypertension   . Hyperlipidemia   . Reflux   . Arthritis     Gout  . Back pain     History  Substance Use Topics  . Smoking status: Former Smoker    Types: Cigarettes    Quit date: 07/03/1983  . Smokeless tobacco: Not on file  . Alcohol Use: Yes    Family History  Problem Relation Age of Onset  . Diabetes Father   . Stroke Father   . Heart attack Brother     X's 2  . Coronary artery disease Mother   . Diabetes Sister     No Known Allergies  Current outpatient prescriptions:allopurinol (ZYLOPRIM) 100 MG tablet, Take 100 mg by mouth daily., Disp: , Rfl: ;  furosemide (LASIX) 40 MG tablet, Take 40 mg by mouth daily., Disp: , Rfl: ;  HYDROcodone-acetaminophen (NORCO/VICODIN) 5-325 MG per tablet, continuous as needed., Disp: , Rfl: ;  metoprolol succinate (TOPROL-XL) 50 MG 24 hr tablet, Take 50 mg by mouth daily. Take with or immediately following a meal., Disp: , Rfl:  omeprazole (PRILOSEC) 20 MG capsule, Take 20 mg by mouth daily., Disp: , Rfl: ;  rosuvastatin (CRESTOR) 20 MG tablet, Take 20 mg by mouth daily., Disp: , Rfl: ;  METOPROLOL TARTRATE PO, Take 50 mg by mouth daily., Disp: , Rfl: ;  PRILOSEC OTC 20 MG tablet, daily., Disp: , Rfl:   BP 161/102  Pulse 70  Resp 18  Ht 5\' 9"  (1.753 m)  Wt 195 lb (88.451 kg)  BMI 28.78 kg/m2  Body mass index is 28.78 kg/(m^2).           Review of Systems denies  chest pain, dyspnea on exertion, PND, orthopnea, claudication. All other systems negative and complete review of systems    Objective:   Physical Exam BP 161/102  Pulse 70  Resp 18  Ht 5\' 9"  (1.753 m)  Wt 195 lb (88.451 kg)  BMI 28.78 kg/m2  Gen.-alert and oriented x3 in no apparent distress HEENT normal for age Lungs no rhonchi or wheezing Cardiovascular regular rhythm no murmurs carotid pulses 3+ palpable no bruits audible Abdomen soft nontender no palpable masses Musculoskeletal free of  major deformities Skin clear -no rashes Neurologic normal Lower extremities 3+ femoral and dorsalis pedis pulses palpable bilaterally with no edema  As noted above CT angiogram reviewed and patient has 5.6 cm infrarenal abdominal aortic aneurysm      Assessment:     5.6 cm, aortic aneurysm-stent graft candidate    Plan:     Plan EVAR using a Gore-C3-excluder device-aortobiiliac scheduled for Wednesday, October 29 Risks and benefits of this procedure were thoroughly discussed and patient would like to proceed he understands that open repair is also a possibility of technical problems arise

## 2013-04-23 ENCOUNTER — Encounter (HOSPITAL_COMMUNITY): Payer: Self-pay | Admitting: Pharmacy Technician

## 2013-04-24 ENCOUNTER — Other Ambulatory Visit (HOSPITAL_COMMUNITY): Payer: Self-pay | Admitting: *Deleted

## 2013-04-24 ENCOUNTER — Ambulatory Visit (HOSPITAL_COMMUNITY)
Admission: RE | Admit: 2013-04-24 | Discharge: 2013-04-24 | Disposition: A | Discharge status: Home / Self Care | Payer: Medicare Other | Source: Ambulatory Visit | Attending: Vascular Surgery | Admitting: Vascular Surgery

## 2013-04-24 ENCOUNTER — Encounter (HOSPITAL_COMMUNITY): Payer: Self-pay

## 2013-04-24 ENCOUNTER — Encounter (HOSPITAL_COMMUNITY)
Admission: RE | Admit: 2013-04-24 | Discharge: 2013-04-24 | Disposition: A | Discharge status: Home / Self Care | Payer: Medicare Other | Source: Ambulatory Visit | Attending: Vascular Surgery | Admitting: Vascular Surgery

## 2013-04-24 ENCOUNTER — Other Ambulatory Visit: Payer: Self-pay

## 2013-04-24 DIAGNOSIS — Z01812 Encounter for preprocedural laboratory examination: Secondary | ICD-10-CM | POA: Insufficient documentation

## 2013-04-24 DIAGNOSIS — Z01818 Encounter for other preprocedural examination: Secondary | ICD-10-CM | POA: Insufficient documentation

## 2013-04-24 DIAGNOSIS — Z01810 Encounter for preprocedural cardiovascular examination: Secondary | ICD-10-CM | POA: Insufficient documentation

## 2013-04-24 DIAGNOSIS — R9431 Abnormal electrocardiogram [ECG] [EKG]: Secondary | ICD-10-CM | POA: Insufficient documentation

## 2013-04-24 HISTORY — DX: Gastro-esophageal reflux disease without esophagitis: K21.9

## 2013-04-24 LAB — COMPREHENSIVE METABOLIC PANEL
AST: 20 U/L (ref 0–37)
Albumin: 4.3 g/dL (ref 3.5–5.2)
Alkaline Phosphatase: 137 U/L — ABNORMAL HIGH (ref 39–117)
BUN: 15 mg/dL (ref 6–23)
Chloride: 100 mEq/L (ref 96–112)
GFR calc Af Amer: >90 mL/min (ref >90)
Glucose, Bld: 153 mg/dL — ABNORMAL HIGH (ref 70–99)
Potassium: 3.7 mEq/L (ref 3.5–5.1)
Sodium: 137 mEq/L (ref 135–145)
Total Protein: 8 g/dL (ref 6.0–8.3)

## 2013-04-24 LAB — URINALYSIS, ROUTINE W REFLEX MICROSCOPIC
Hgb urine dipstick: NEGATIVE
Leukocytes, UA: NEGATIVE
Nitrite: NEGATIVE
Specific Gravity, Urine: 1.007 (ref 1.005–1.030)
Urobilinogen, UA: 0.2 mg/dL (ref 0.0–1.0)

## 2013-04-24 LAB — BLOOD GAS, ARTERIAL
Acid-Base Excess: 1.3 mmol/L (ref 0.0–2.0)
Bicarbonate: 25 mEq/L — ABNORMAL HIGH (ref 20.0–24.0)
Drawn by: 344381
FIO2: 0.21 %
O2 Saturation: 96.2 %
TCO2: 26.2 mmol/L (ref 0–100)
pCO2 arterial: 37.1 mmHg (ref 35.0–45.0)
pH, Arterial: 7.444 (ref 7.350–7.450)
pO2, Arterial: 76.9 mmHg — ABNORMAL LOW (ref 80.0–100.0)

## 2013-04-24 LAB — SURGICAL PCR SCREEN: Staphylococcus aureus: NEGATIVE

## 2013-04-24 LAB — CBC
HCT: 44.8 % (ref 39.0–52.0)
Hemoglobin: 15.9 g/dL (ref 13.0–17.0)
MCH: 31 pg (ref 26.0–34.0)
MCHC: 35.5 g/dL (ref 30.0–36.0)
RDW: 12.3 % (ref 11.5–15.5)
WBC: 7.1 10*3/uL (ref 4.0–10.5)

## 2013-04-24 LAB — APTT: aPTT: 26 seconds (ref 24–37)

## 2013-04-24 LAB — PROTIME-INR: Prothrombin Time: 12 seconds (ref 11.6–15.2)

## 2013-04-24 LAB — PREPARE RBC (CROSSMATCH)

## 2013-04-24 LAB — ABO/RH: ABO/RH(D): O NEG

## 2013-04-24 IMAGING — CR DG CHEST 2V
2 series · 2 of 2 positions shown · non-contrast
Comparison: None.

CLINICAL DATA: Preop abdominal aortic graft repair

EXAM:
CHEST  2 VIEW

[w chest pa]
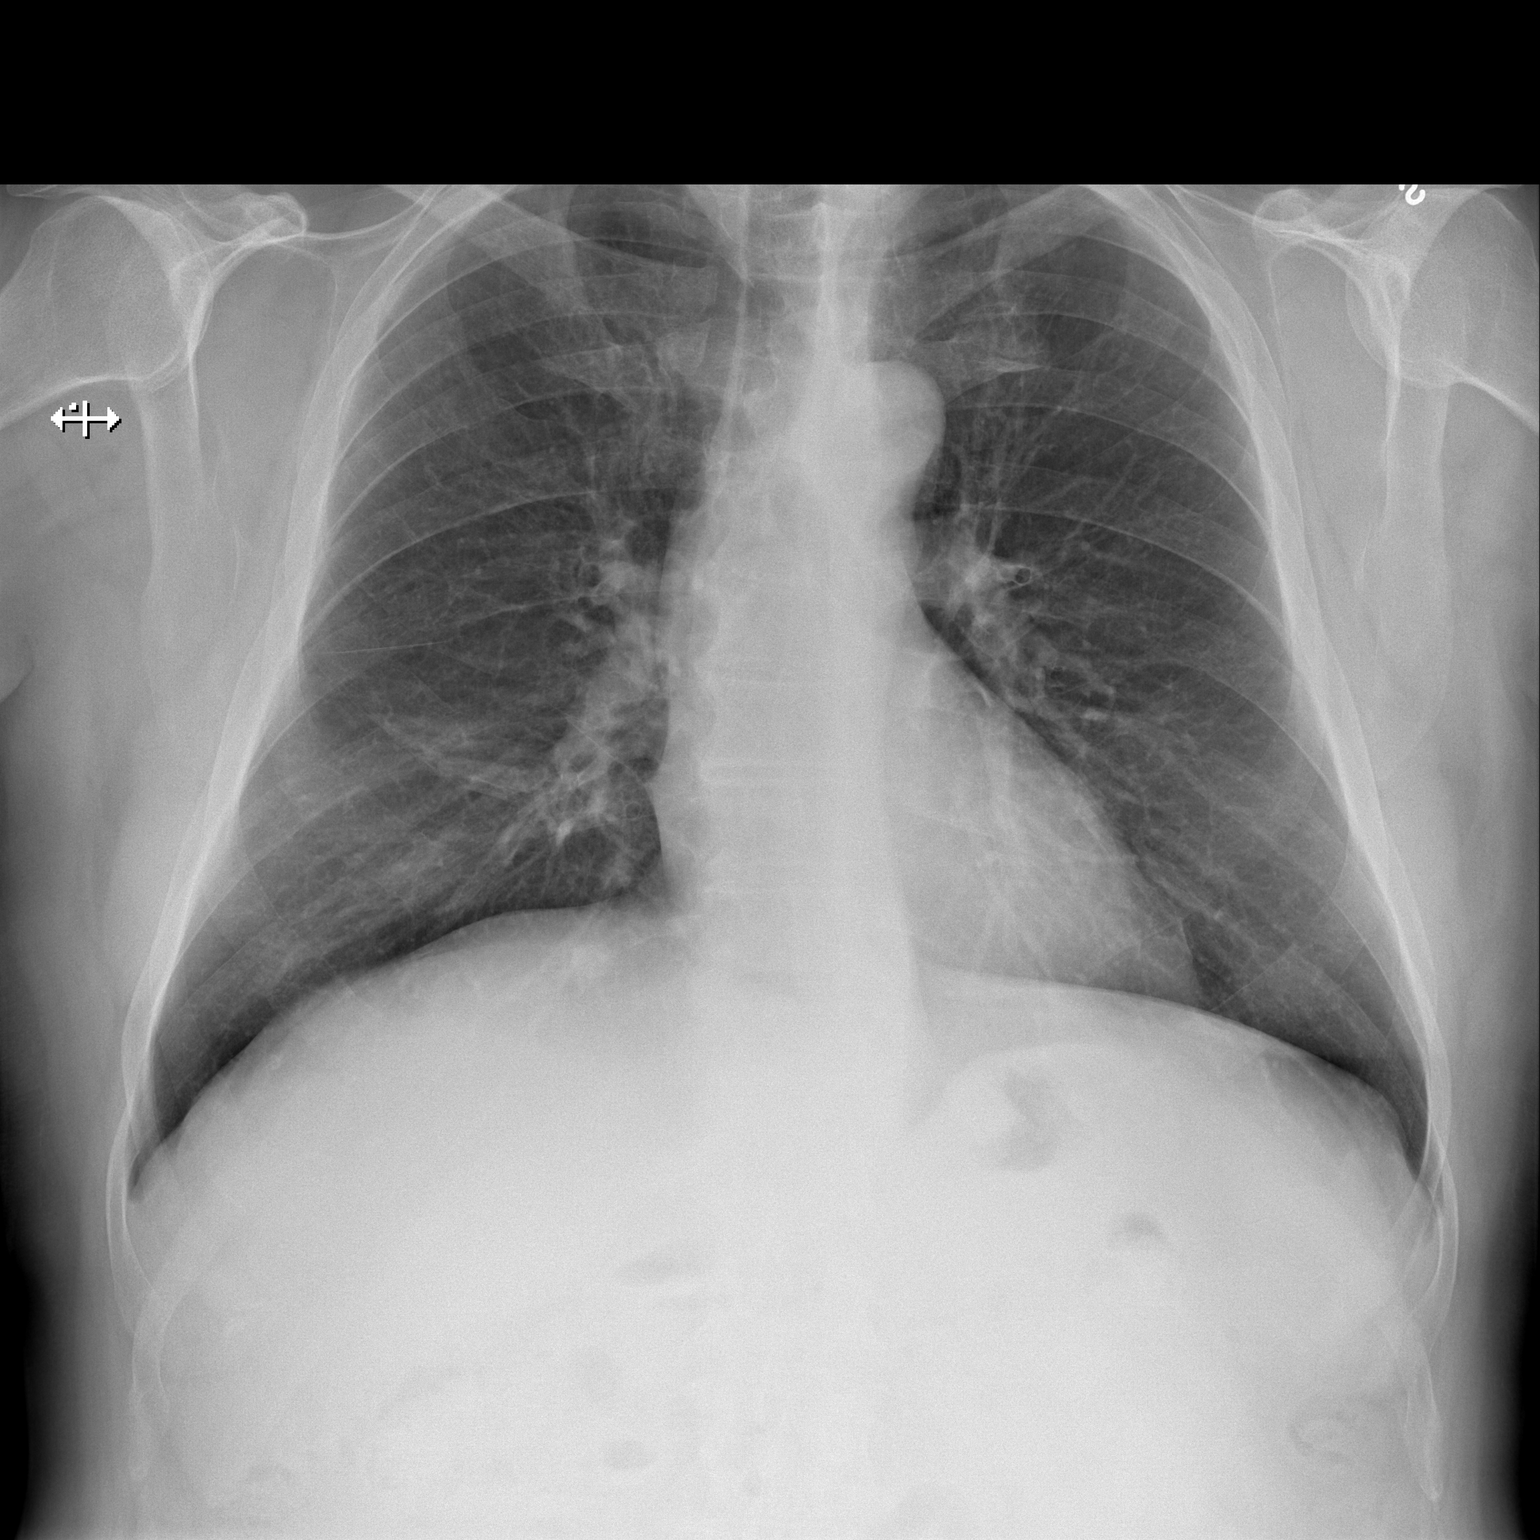

[w chest lat]
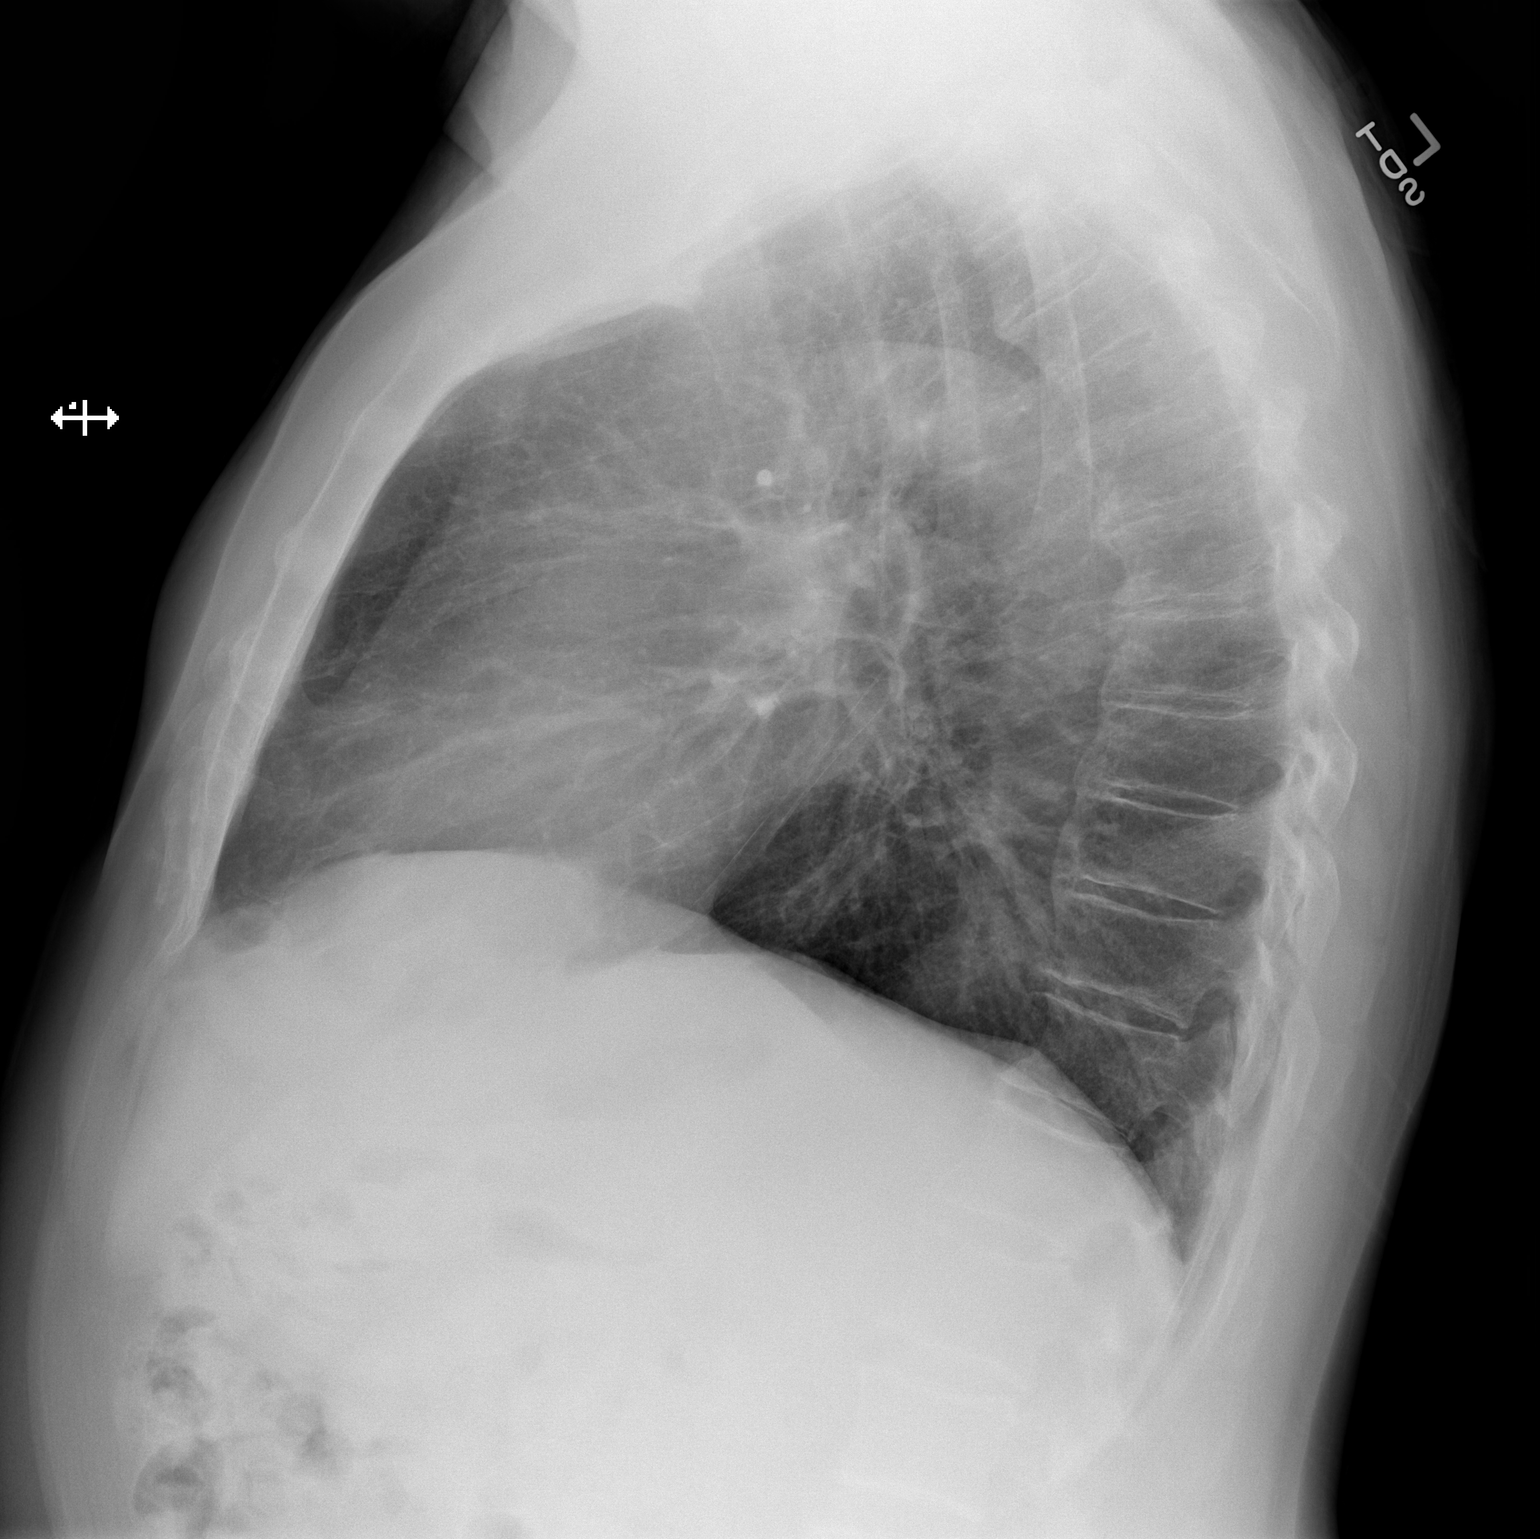

[2 of 2 positions shown; findings below may reference images not displayed]

FINDINGS: Normal mediastinum and cardiac silhouette. Normal pulmonary
vasculature. No evidence of effusion, infiltrate, or pneumothorax.
No acute bony abnormality. Degenerative osteophytosis of the
thoracic spine.
IMPRESSION: No acute cardiopulmonary process.

## 2013-04-24 NOTE — Pre-Procedure Instructions (Signed)
August Longest  04/24/2013   Your procedure is scheduled on:  Wednesday, April 29, 2013 at 8:30 AM.   Report to [facility] Entrance "A" at 6:30 AM.   Call this number if you have problems the morning of surgery: [phone]   Remember:   Do not eat food or drink liquids after midnight Tuesday, 04/28/13.   Take these medicines the morning of surgery with A SIP OF WATER:  metoprolol succinate (TOPROL-XL), omeprazole (PRILOSEC), HYDROcodone-acetaminophen (NORCO/VICODIN) - if needed.                Do not wear jewelry..  Do not wear lotions, powders, or cologne. You may wear deodorant.  Do not shave 48 hours prior to surgery. Men may shave face and neck.  Do not bring valuables to the hospital.  [facility] is not responsible                  for any belongings or valuables.               Contacts, dentures or bridgework may not be worn into surgery.  Leave suitcase in the car. After surgery it may be brought to your room.  For patients admitted to the hospital, discharge time is determined by your                treatment team.   Special Instructions: Shower using CHG 2 nights before surgery and the night before surgery.  If you shower the day of surgery use CHG.  Use special wash - you have one bottle of CHG for all showers.  You should use approximately 1/3 of the bottle for each shower.   Please read over the following fact sheets that you were given: Pain Booklet, Coughing and Deep Breathing, Blood Transfusion Information, MRSA Information and Surgical Site Infection Prevention

## 2013-04-28 ENCOUNTER — Encounter (HOSPITAL_COMMUNITY): Payer: Self-pay | Admitting: Certified Registered Nurse Anesthetist

## 2013-04-28 MED ORDER — CEFUROXIME SODIUM 1.5 G IJ SOLR
1.5000 g | INTRAMUSCULAR | Status: AC
  Administered 2013-04-29: 1.5 g via INTRAVENOUS
  Filled 2013-04-28: qty 1.5

## 2013-04-29 ENCOUNTER — Inpatient Hospital Stay (HOSPITAL_COMMUNITY)
Admission: RE | Admit: 2013-04-29 | Discharge: 2013-04-30 | DRG: 238 | Disposition: A | Discharge status: Home / Self Care | Payer: Medicare Other | Source: Ambulatory Visit | Attending: Vascular Surgery | Admitting: Vascular Surgery

## 2013-04-29 ENCOUNTER — Encounter (HOSPITAL_COMMUNITY)
Admission: RE | Disposition: A | Discharge status: Home / Self Care | Payer: Self-pay | Source: Ambulatory Visit | Attending: Vascular Surgery

## 2013-04-29 ENCOUNTER — Inpatient Hospital Stay (HOSPITAL_COMMUNITY): Payer: Medicare Other | Admitting: Certified Registered Nurse Anesthetist

## 2013-04-29 ENCOUNTER — Encounter (HOSPITAL_COMMUNITY): Payer: Self-pay | Admitting: *Deleted

## 2013-04-29 ENCOUNTER — Encounter (HOSPITAL_COMMUNITY): Payer: Medicare Other | Admitting: Certified Registered Nurse Anesthetist

## 2013-04-29 ENCOUNTER — Inpatient Hospital Stay (HOSPITAL_COMMUNITY): Payer: Medicare Other

## 2013-04-29 DIAGNOSIS — E785 Hyperlipidemia, unspecified: Secondary | ICD-10-CM | POA: Diagnosis present

## 2013-04-29 DIAGNOSIS — Z87891 Personal history of nicotine dependence: Secondary | ICD-10-CM

## 2013-04-29 DIAGNOSIS — M109 Gout, unspecified: Secondary | ICD-10-CM | POA: Diagnosis present

## 2013-04-29 DIAGNOSIS — Z79899 Other long term (current) drug therapy: Secondary | ICD-10-CM

## 2013-04-29 DIAGNOSIS — I714 Abdominal aortic aneurysm, without rupture, unspecified: Principal | ICD-10-CM | POA: Diagnosis present

## 2013-04-29 DIAGNOSIS — D62 Acute posthemorrhagic anemia: Secondary | ICD-10-CM | POA: Diagnosis not present

## 2013-04-29 DIAGNOSIS — K219 Gastro-esophageal reflux disease without esophagitis: Secondary | ICD-10-CM | POA: Diagnosis present

## 2013-04-29 DIAGNOSIS — I10 Essential (primary) hypertension: Secondary | ICD-10-CM | POA: Diagnosis present

## 2013-04-29 HISTORY — PX: ABDOMINAL AORTIC ENDOVASCULAR STENT GRAFT: SHX5707

## 2013-04-29 LAB — BASIC METABOLIC PANEL
CO2: 22 mEq/L (ref 19–32)
Calcium: 8.7 mg/dL (ref 8.4–10.5)
Chloride: 100 mEq/L (ref 96–112)
GFR calc Af Amer: >90 mL/min (ref >90)
Glucose, Bld: 145 mg/dL — ABNORMAL HIGH (ref 70–99)
Sodium: 138 mEq/L (ref 135–145)

## 2013-04-29 LAB — PROTIME-INR
INR: 1.02 (ref 0.00–1.49)
Prothrombin Time: 13.2 seconds (ref 11.6–15.2)

## 2013-04-29 LAB — CBC
MCH: 30.8 pg (ref 26.0–34.0)
Platelets: 108 10*3/uL — ABNORMAL LOW (ref 150–400)
RBC: 4.39 MIL/uL (ref 4.22–5.81)
RDW: 12.7 % (ref 11.5–15.5)
WBC: 7.7 10*3/uL (ref 4.0–10.5)

## 2013-04-29 LAB — MAGNESIUM: Magnesium: 2.1 mg/dL (ref 1.5–2.5)

## 2013-04-29 LAB — APTT: aPTT: 28 seconds (ref 24–37)

## 2013-04-29 IMAGING — CR DG CHEST 1V PORT
1 series · 1 of 1 positions shown · non-contrast
Comparison: [DATE]

CLINICAL DATA: Postop stent graft placement.

EXAM:
PORTABLE CHEST - 1 VIEW

[AP]
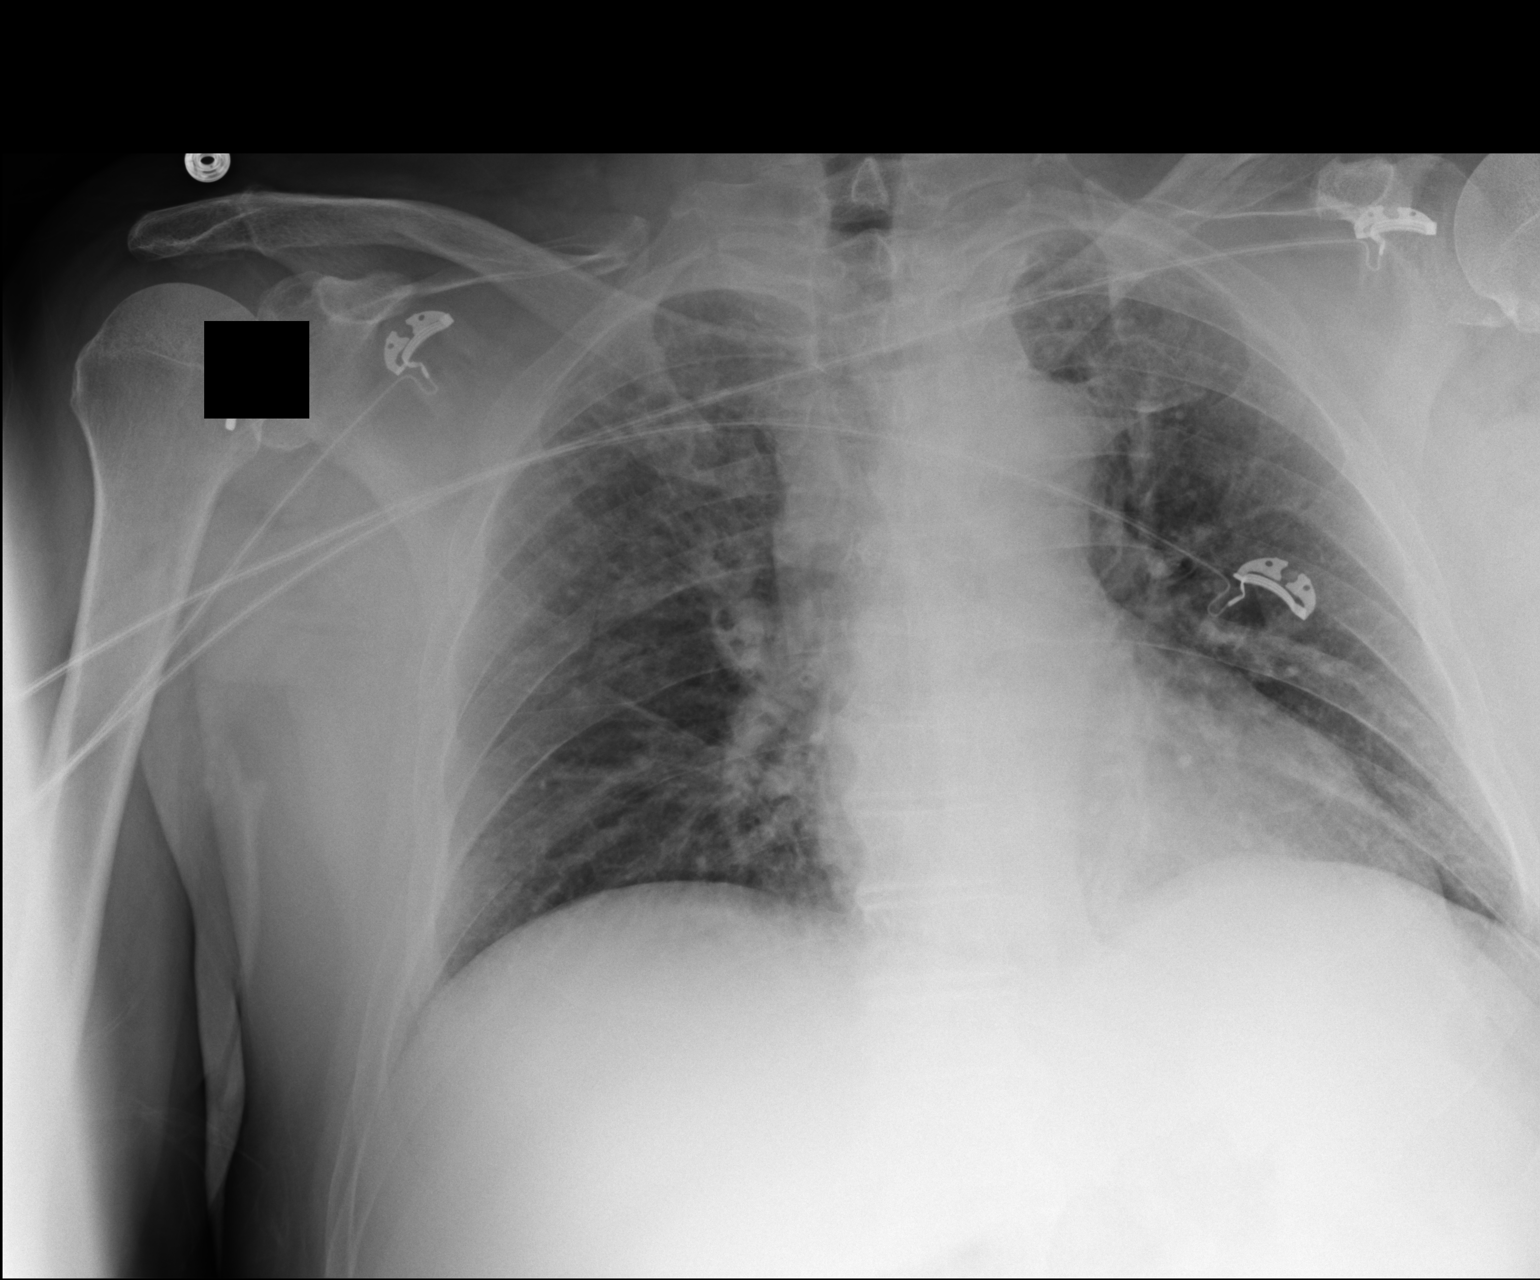

[1 of 1 positions shown; findings below may reference images not displayed]

FINDINGS: Peribronchial thickening. Heart is upper limits normal in size.
There are low lung volumes. No confluent airspace opacities or
effusions.
IMPRESSION: Low lung volumes, bronchitic changes.

## 2013-04-29 IMAGING — CR DG ABD PORTABLE 1V
1 series · 1 of 1 positions shown · non-contrast
Comparison: CT [DATE]

CLINICAL DATA: Post stent graft

EXAM:
PORTABLE ABDOMEN - 1 VIEW

[AP]
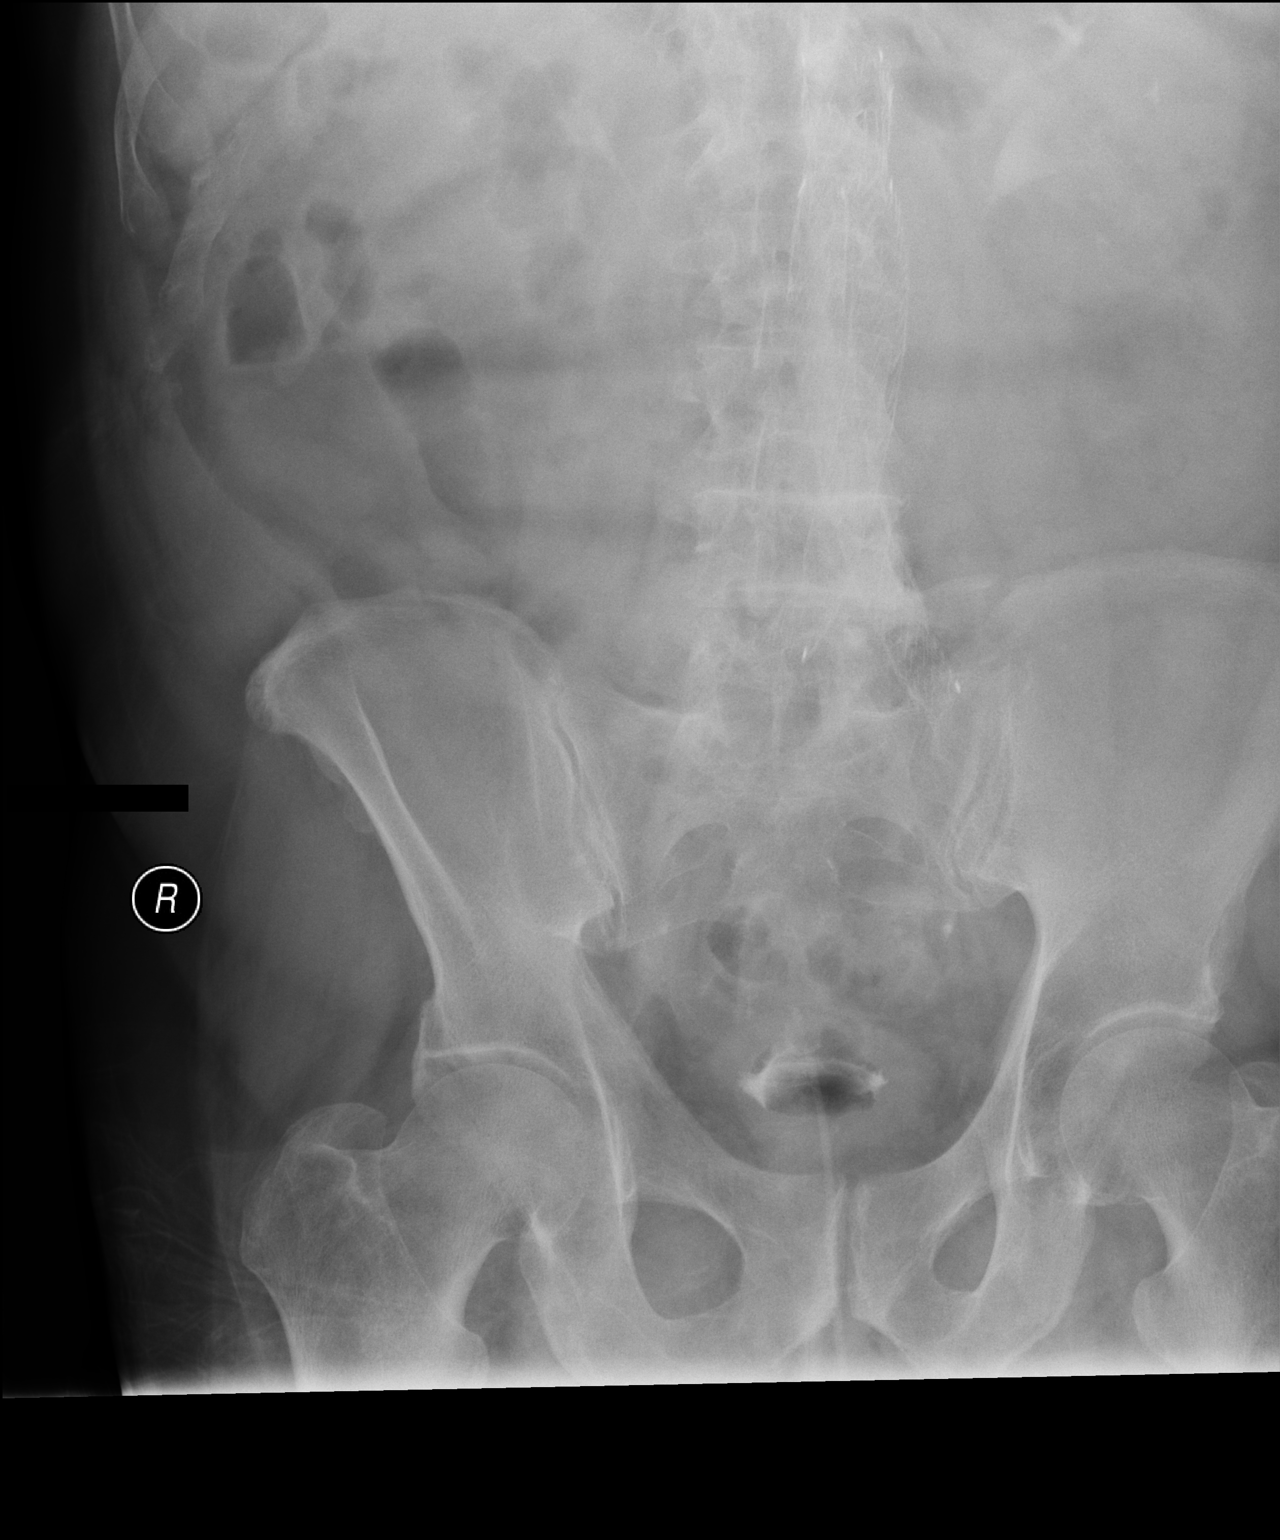

[1 of 1 positions shown; findings below may reference images not displayed]

FINDINGS: Stent graft is noted in the aorta and extending into the iliac
arteries bilaterally. There is a nonobstructive bowel gas pattern.
No spine evidence of free air or organomegaly.
IMPRESSION: Stent graft visualized, extending into the iliac arteries.

No acute findings.

## 2013-04-29 SURGERY — INSERTION, ENDOVASCULAR STENT GRAFT, AORTA, ABDOMINAL
Anesthesia: General | Site: Abdomen | Wound class: Clean

## 2013-04-29 MED ORDER — LABETALOL HCL 5 MG/ML IV SOLN: 10.0000 mg | INTRAVENOUS | Status: DC | PRN

## 2013-04-29 MED ORDER — HYDROCODONE-ACETAMINOPHEN 5-325 MG PO TABS
1.0000 | ORAL_TABLET | ORAL | Status: DC | PRN
  Administered 2013-04-29: 1 via ORAL
  Filled 2013-04-29: qty 1

## 2013-04-29 MED ORDER — HYDROMORPHONE HCL PF 1 MG/ML IJ SOLN
INTRAMUSCULAR | Status: AC
  Filled 2013-04-29: qty 1

## 2013-04-29 MED ORDER — SODIUM CHLORIDE 0.9 % IR SOLN
Status: DC | PRN
  Administered 2013-04-29: 09:00:00

## 2013-04-29 MED ORDER — MAGNESIUM SULFATE 40 MG/ML IJ SOLN
2.0000 g | Freq: Once | INTRAMUSCULAR | Status: AC | PRN
  Filled 2013-04-29: qty 50

## 2013-04-29 MED ORDER — GLYCOPYRROLATE 0.2 MG/ML IJ SOLN
INTRAMUSCULAR | Status: DC | PRN
  Administered 2013-04-29: .6 mg via INTRAVENOUS

## 2013-04-29 MED ORDER — METOPROLOL TARTRATE 1 MG/ML IV SOLN: 2.0000 mg | INTRAVENOUS | Status: DC | PRN

## 2013-04-29 MED ORDER — DEXTROSE 5 % IV SOLN
1.5000 g | Freq: Two times a day (BID) | INTRAVENOUS | Status: AC
  Administered 2013-04-29 – 2013-04-30 (×2): 1.5 g via INTRAVENOUS
  Filled 2013-04-29 (×3): qty 1.5

## 2013-04-29 MED ORDER — LACTATED RINGERS IV SOLN
INTRAVENOUS | Status: DC | PRN
  Administered 2013-04-29: 08:00:00 via INTRAVENOUS

## 2013-04-29 MED ORDER — ONDANSETRON HCL 4 MG/2ML IJ SOLN
INTRAMUSCULAR | Status: DC | PRN
  Administered 2013-04-29: 4 mg via INTRAVENOUS

## 2013-04-29 MED ORDER — PROMETHAZINE HCL 25 MG/ML IJ SOLN
6.2500 mg | INTRAMUSCULAR | Status: DC | PRN
  Administered 2013-04-29: 6.25 mg via INTRAVENOUS

## 2013-04-29 MED ORDER — NEOSTIGMINE METHYLSULFATE 1 MG/ML IJ SOLN
INTRAMUSCULAR | Status: DC | PRN
  Administered 2013-04-29: 1 mg via INTRAVENOUS
  Administered 2013-04-29: 4 mg via INTRAVENOUS

## 2013-04-29 MED ORDER — METOPROLOL SUCCINATE ER 50 MG PO TB24
50.0000 mg | ORAL_TABLET | Freq: Every day | ORAL | Status: DC
  Administered 2013-04-30: 50 mg via ORAL
  Filled 2013-04-29: qty 1

## 2013-04-29 MED ORDER — DOCUSATE SODIUM 100 MG PO CAPS
100.0000 mg | ORAL_CAPSULE | Freq: Every day | ORAL | Status: DC
  Administered 2013-04-30: 100 mg via ORAL
  Filled 2013-04-29: qty 1

## 2013-04-29 MED ORDER — IODIXANOL 320 MG/ML IV SOLN
INTRAVENOUS | Status: DC | PRN
  Administered 2013-04-29: 60.4 mL via INTRAVENOUS

## 2013-04-29 MED ORDER — SODIUM CHLORIDE 0.9 % IV SOLN: 500.0000 mL | Freq: Once | INTRAVENOUS | Status: AC | PRN

## 2013-04-29 MED ORDER — OXYCODONE HCL 5 MG PO TABS
ORAL_TABLET | ORAL | Status: AC
  Filled 2013-04-29: qty 1

## 2013-04-29 MED ORDER — ACETAMINOPHEN 325 MG PO TABS: 325.0000 mg | ORAL_TABLET | ORAL | Status: DC | PRN

## 2013-04-29 MED ORDER — 0.9 % SODIUM CHLORIDE (POUR BTL) OPTIME
TOPICAL | Status: DC | PRN
  Administered 2013-04-29: 1000 mL

## 2013-04-29 MED ORDER — PANTOPRAZOLE SODIUM 40 MG PO TBEC
40.0000 mg | DELAYED_RELEASE_TABLET | Freq: Every day | ORAL | Status: DC
  Administered 2013-04-30: 40 mg via ORAL
  Filled 2013-04-29: qty 1

## 2013-04-29 MED ORDER — MORPHINE SULFATE 2 MG/ML IJ SOLN: 2.0000 mg | INTRAMUSCULAR | Status: DC | PRN

## 2013-04-29 MED ORDER — FENTANYL CITRATE 0.05 MG/ML IJ SOLN
INTRAMUSCULAR | Status: DC | PRN
  Administered 2013-04-29: 100 ug via INTRAVENOUS
  Administered 2013-04-29: 50 ug via INTRAVENOUS
  Administered 2013-04-29: 100 ug via INTRAVENOUS

## 2013-04-29 MED ORDER — HYDROCODONE-ACETAMINOPHEN 5-325 MG PO TABS: 1.0000 | ORAL_TABLET | ORAL | Status: DC | PRN

## 2013-04-29 MED ORDER — ACETAMINOPHEN 650 MG RE SUPP: 325.0000 mg | RECTAL | Status: DC | PRN

## 2013-04-29 MED ORDER — ROCURONIUM BROMIDE 100 MG/10ML IV SOLN
INTRAVENOUS | Status: DC | PRN
  Administered 2013-04-29: 50 mg via INTRAVENOUS

## 2013-04-29 MED ORDER — OXYCODONE HCL 5 MG/5ML PO SOLN: 5.0000 mg | Freq: Once | ORAL | Status: AC | PRN

## 2013-04-29 MED ORDER — ONDANSETRON HCL 4 MG/2ML IJ SOLN: 4.0000 mg | Freq: Four times a day (QID) | INTRAMUSCULAR | Status: DC | PRN

## 2013-04-29 MED ORDER — ALLOPURINOL 100 MG PO TABS
200.0000 mg | ORAL_TABLET | Freq: Every day | ORAL | Status: DC
  Administered 2013-04-30: 200 mg via ORAL
  Filled 2013-04-29: qty 2

## 2013-04-29 MED ORDER — HYDROMORPHONE HCL PF 1 MG/ML IJ SOLN
0.2500 mg | INTRAMUSCULAR | Status: DC | PRN
  Administered 2013-04-29 (×2): 0.25 mg via INTRAVENOUS
  Administered 2013-04-29: 0.5 mg via INTRAVENOUS

## 2013-04-29 MED ORDER — MIDAZOLAM HCL 5 MG/5ML IJ SOLN
INTRAMUSCULAR | Status: DC | PRN
  Administered 2013-04-29: 1 mg via INTRAVENOUS

## 2013-04-29 MED ORDER — PROPOFOL 10 MG/ML IV BOLUS
INTRAVENOUS | Status: DC | PRN
  Administered 2013-04-29: 50 mg via INTRAVENOUS
  Administered 2013-04-29: 200 mg via INTRAVENOUS
  Administered 2013-04-29: 100 mg via INTRAVENOUS

## 2013-04-29 MED ORDER — LABETALOL HCL 5 MG/ML IV SOLN
INTRAVENOUS | Status: DC | PRN
  Administered 2013-04-29: 5 mg via INTRAVENOUS

## 2013-04-29 MED ORDER — HEPARIN SODIUM (PORCINE) 1000 UNIT/ML IJ SOLN
INTRAMUSCULAR | Status: DC | PRN
  Administered 2013-04-29: 6000 [IU] via INTRAVENOUS

## 2013-04-29 MED ORDER — LIDOCAINE HCL (CARDIAC) 20 MG/ML IV SOLN
INTRAVENOUS | Status: DC | PRN
  Administered 2013-04-29: 100 mg via INTRAVENOUS

## 2013-04-29 MED ORDER — FUROSEMIDE 40 MG PO TABS
40.0000 mg | ORAL_TABLET | Freq: Every day | ORAL | Status: DC
  Administered 2013-04-30: 40 mg via ORAL
  Filled 2013-04-29: qty 1

## 2013-04-29 MED ORDER — GUAIFENESIN-DM 100-10 MG/5ML PO SYRP: 15.0000 mL | ORAL_SOLUTION | ORAL | Status: DC | PRN

## 2013-04-29 MED ORDER — PROMETHAZINE HCL 25 MG/ML IJ SOLN
INTRAMUSCULAR | Status: AC
  Filled 2013-04-29: qty 1

## 2013-04-29 MED ORDER — SODIUM CHLORIDE 0.9 % IV SOLN: INTRAVENOUS | Status: DC

## 2013-04-29 MED ORDER — PHENOL 1.4 % MT LIQD: 1.0000 | OROMUCOSAL | Status: DC | PRN

## 2013-04-29 MED ORDER — HYDRALAZINE HCL 20 MG/ML IJ SOLN
INTRAMUSCULAR | Status: DC | PRN
  Administered 2013-04-29: 10 mg via INTRAVENOUS
  Administered 2013-04-29 (×2): 5 mg via INTRAVENOUS

## 2013-04-29 MED ORDER — OXYCODONE HCL 5 MG PO TABS
5.0000 mg | ORAL_TABLET | Freq: Once | ORAL | Status: AC | PRN
  Administered 2013-04-29: 5 mg via ORAL

## 2013-04-29 MED ORDER — POTASSIUM CHLORIDE CRYS ER 20 MEQ PO TBCR
20.0000 meq | EXTENDED_RELEASE_TABLET | Freq: Every day | ORAL | Status: DC | PRN
  Administered 2013-04-30: 40 meq via ORAL
  Filled 2013-04-29: qty 2

## 2013-04-29 MED ORDER — HYDRALAZINE HCL 20 MG/ML IJ SOLN: 10.0000 mg | INTRAMUSCULAR | Status: DC | PRN

## 2013-04-29 MED ORDER — PROTAMINE SULFATE 10 MG/ML IV SOLN
INTRAVENOUS | Status: DC | PRN
  Administered 2013-04-29: 50 mg via INTRAVENOUS

## 2013-04-29 MED ORDER — DEXTROSE-NACL 5-0.9 % IV SOLN
INTRAVENOUS | Status: DC
  Administered 2013-04-29: 14:00:00 via INTRAVENOUS

## 2013-04-29 SURGICAL SUPPLY — 69 items
BAG BANDED W/RUBBER/TAPE 36X54 (MISCELLANEOUS) ×2 IMPLANT
BAG SNAP BAND KOVER 36X36 (MISCELLANEOUS) ×4 IMPLANT
CANISTER SUCTION 2500CC (MISCELLANEOUS) ×2 IMPLANT
CATH BEACON 5.038 65CM KMP-01 (CATHETERS) IMPLANT
CATH OMNI FLUSH .035X70CM (CATHETERS) ×2 IMPLANT
CLIP TI MEDIUM 24 (CLIP) IMPLANT
CLIP TI WIDE RED SMALL 24 (CLIP) IMPLANT
COVER DOME SNAP 22 D (MISCELLANEOUS) ×2 IMPLANT
COVER MAYO STAND STRL (DRAPES) ×2 IMPLANT
COVER PROBE W GEL 5X96 (DRAPES) ×2 IMPLANT
COVER SURGICAL LIGHT HANDLE (MISCELLANEOUS) ×2 IMPLANT
DERMABOND ADVANCED (GAUZE/BANDAGES/DRESSINGS) ×2
DERMABOND ADVANCED .7 DNX12 (GAUZE/BANDAGES/DRESSINGS) ×2 IMPLANT
DEVICE CLOSURE PERCLS PRGLD 6F (VASCULAR PRODUCTS) ×5 IMPLANT
DRAPE TABLE COVER HEAVY DUTY (DRAPES) ×2 IMPLANT
DRESSING OPSITE X SMALL 2X3 (GAUZE/BANDAGES/DRESSINGS) ×4 IMPLANT
DRYSEAL FLEXSHEATH 12FR 33CM (SHEATH) ×1
DRYSEAL FLEXSHEATH 16FR 33CM (SHEATH) ×1
ELECT CAUTERY BLADE 6.4 (BLADE) IMPLANT
ELECT REM PT RETURN 9FT ADLT (ELECTROSURGICAL) ×4
ELECTRODE REM PT RTRN 9FT ADLT (ELECTROSURGICAL) ×2 IMPLANT
EXCLUDER TNK LEG 26MX12X16 (Endovascular Graft) ×1 IMPLANT
EXCLUDER TRUNK LEG 26MX12X16 (Endovascular Graft) ×2 IMPLANT
GAUZE SPONGE 2X2 8PLY STRL LF (GAUZE/BANDAGES/DRESSINGS) ×2 IMPLANT
GLOVE BIOGEL PI IND STRL 7.5 (GLOVE) ×1 IMPLANT
GLOVE BIOGEL PI INDICATOR 7.5 (GLOVE) ×1
GLOVE SS BIOGEL STRL SZ 7 (GLOVE) ×1 IMPLANT
GLOVE SUPERSENSE BIOGEL SZ 7 (GLOVE) ×1
GLOVE SURG SS PI 7.5 STRL IVOR (GLOVE) ×2 IMPLANT
GOWN STRL NON-REIN LRG LVL3 (GOWN DISPOSABLE) ×6 IMPLANT
GOWN STRL REIN XL XLG (GOWN DISPOSABLE) ×2 IMPLANT
GRAFT BALLN CATH 65CM (STENTS) ×1 IMPLANT
GRAFT EXCLUDER LEG 12X14 (Endovascular Graft) ×2 IMPLANT
KIT BASIN OR (CUSTOM PROCEDURE TRAY) ×2 IMPLANT
KIT ROOM TURNOVER OR (KITS) ×2 IMPLANT
NEEDLE PERC 18GX7CM (NEEDLE) ×2 IMPLANT
NS IRRIG 1000ML POUR BTL (IV SOLUTION) ×2 IMPLANT
PACK AORTA (CUSTOM PROCEDURE TRAY) ×2 IMPLANT
PAD ARMBOARD 7.5X6 YLW CONV (MISCELLANEOUS) ×4 IMPLANT
PENCIL BUTTON HOLSTER BLD 10FT (ELECTRODE) IMPLANT
PERCLOSE PROGLIDE 6F (VASCULAR PRODUCTS) ×10
PROTECTION STATION PRESSURIZED (MISCELLANEOUS)
SHEATH AVANTI 11CM 8FR (MISCELLANEOUS) ×2 IMPLANT
SHEATH BRITE TIP 8FR 23CM (MISCELLANEOUS) ×2 IMPLANT
SHEATH DRYSEAL FLEX 12FR 33CM (SHEATH) ×1 IMPLANT
SHEATH DRYSEAL FLEX 16FR 33CM (SHEATH) ×1 IMPLANT
SPONGE GAUZE 2X2 STER 10/PKG (GAUZE/BANDAGES/DRESSINGS) ×2
STATION PROTECTION PRESSURIZED (MISCELLANEOUS) IMPLANT
STENT GRAFT BALLN CATH 65CM (STENTS) ×1
STOPCOCK MORSE 400PSI 3WAY (MISCELLANEOUS) ×2 IMPLANT
SUT PROLENE 5 0 C 1 24 (SUTURE) IMPLANT
SUT PROLENE 5 0 CC 1 (SUTURE) IMPLANT
SUT PROLENE 6 0 C 1 30 (SUTURE) IMPLANT
SUT VIC AB 2-0 CT1 27 (SUTURE)
SUT VIC AB 2-0 CT1 TAPERPNT 27 (SUTURE) IMPLANT
SUT VIC AB 3-0 SH 27 (SUTURE)
SUT VIC AB 3-0 SH 27X BRD (SUTURE) IMPLANT
SUT VICRYL 4-0 PS2 18IN ABS (SUTURE) ×4 IMPLANT
SYR 20CC LL (SYRINGE) ×4 IMPLANT
SYR 30ML LL (SYRINGE) IMPLANT
SYR 5ML LL (SYRINGE) IMPLANT
SYR MEDRAD MARK V 150ML (SYRINGE) ×2 IMPLANT
SYRINGE 10CC LL (SYRINGE) ×4 IMPLANT
TOWEL OR 17X24 6PK STRL BLUE (TOWEL DISPOSABLE) ×4 IMPLANT
TOWEL OR 17X26 10 PK STRL BLUE (TOWEL DISPOSABLE) ×4 IMPLANT
TRAY FOLEY CATH 16FRSI W/METER (SET/KITS/TRAYS/PACK) ×2 IMPLANT
TUBING HIGH PRESSURE 120CM (CONNECTOR) ×2 IMPLANT
WIRE AMPLATZ SS-J .035X180CM (WIRE) ×4 IMPLANT
WIRE BENTSON .035X145CM (WIRE) ×4 IMPLANT

## 2013-04-29 NOTE — H&P (View-Only) (Signed)
Subjective:     Patient ID: [name] [name], male   DOB: 06/06/1946, 67 y.o.   MRN: [id number]  HPI this 67-year-old male returns for continued evaluation regarding his abdominal aortic aneurysm. Today he had a CT angiogram which I have reviewed by computer. The aneurysm measures 5.6 cm in maximum diameter and does appear to be a suitable candidate for aortic stent grafting. Patient also had a Cardiolite performed and was found to have an ejection fraction of 50% with no evidence of ischemia.  Past Medical History  Diagnosis Date  . AAA (abdominal aortic aneurysm)   . Hypertension   . Hyperlipidemia   . Reflux   . Arthritis     Gout  . Back pain     History  Substance Use Topics  . Smoking status: Former Smoker    Types: Cigarettes    Quit date: 07/03/1983  . Smokeless tobacco: Not on file  . Alcohol Use: Yes    Family History  Problem Relation Age of Onset  . Diabetes Father   . Stroke Father   . Heart attack Brother     X's 2  . Coronary artery disease Mother   . Diabetes Sister     No Known Allergies  Current outpatient prescriptions:allopurinol (ZYLOPRIM) 100 MG tablet, Take 100 mg by mouth daily., Disp: , Rfl: ;  furosemide (LASIX) 40 MG tablet, Take 40 mg by mouth daily., Disp: , Rfl: ;  HYDROcodone-acetaminophen (NORCO/VICODIN) 5-325 MG per tablet, continuous as needed., Disp: , Rfl: ;  metoprolol succinate (TOPROL-XL) 50 MG 24 hr tablet, Take 50 mg by mouth daily. Take with or immediately following a meal., Disp: , Rfl:  omeprazole (PRILOSEC) 20 MG capsule, Take 20 mg by mouth daily., Disp: , Rfl: ;  rosuvastatin (CRESTOR) 20 MG tablet, Take 20 mg by mouth daily., Disp: , Rfl: ;  METOPROLOL TARTRATE PO, Take 50 mg by mouth daily., Disp: , Rfl: ;  PRILOSEC OTC 20 MG tablet, daily., Disp: , Rfl:   BP 161/102  Pulse 70  Resp 18  Ht 5' 9" (1.753 m)  Wt 195 lb (88.451 kg)  BMI 28.78 kg/m2  Body mass index is 28.78 kg/(m^2).           Review of Systems denies  chest pain, dyspnea on exertion, PND, orthopnea, claudication. All other systems negative and complete review of systems    Objective:   Physical Exam BP 161/102  Pulse 70  Resp 18  Ht 5' 9" (1.753 m)  Wt 195 lb (88.451 kg)  BMI 28.78 kg/m2  Gen.-alert and oriented x3 in no apparent distress HEENT normal for age Lungs no rhonchi or wheezing Cardiovascular regular rhythm no murmurs carotid pulses 3+ palpable no bruits audible Abdomen soft nontender no palpable masses Musculoskeletal free of  major deformities Skin clear -no rashes Neurologic normal Lower extremities 3+ femoral and dorsalis pedis pulses palpable bilaterally with no edema  As noted above CT angiogram reviewed and patient has 5.6 cm infrarenal abdominal aortic aneurysm      Assessment:     5.6 cm, aortic aneurysm-stent graft candidate    Plan:     Plan EVAR using a Gore-C3-excluder device-aortobiiliac scheduled for Wednesday, October 29 Risks and benefits of this procedure were thoroughly discussed and patient would like to proceed he understands that open repair is also a possibility of technical problems arise      

## 2013-04-29 NOTE — Transfer of Care (Signed)
Immediate Anesthesia Transfer of Care Note  Patient: [name] [name]  Procedure(s) Performed: Procedure(s): ABDOMINAL AORTIC ENDOVASCULAR STENT GRAFT -GORE (N/A)  Patient Location: PACU  Anesthesia Type:General  Level of Consciousness: awake, alert  and oriented  Airway & Oxygen Therapy: Patient Spontanous Breathing  Post-op Assessment: Report given to PACU RN  Post vital signs: Reviewed and stable  Complications: No apparent anesthesia complications

## 2013-04-29 NOTE — Preoperative (Signed)
Beta Blockers   Reason not to administer Beta Blockers:Not Applicable  Pt took am metoprolol. 

## 2013-04-29 NOTE — Interval H&P Note (Signed)
History and Physical Interval Note:  04/29/2013 8:26 AM  [name] [name]  has presented today for surgery, with the diagnosis of Abdominal Aortic Aneurysm  The various methods of treatment have been discussed with the patient and family. After consideration of risks, benefits and other options for treatment, the patient has consented to  Procedure(s): ABDOMINAL AORTIC ENDOVASCULAR STENT GRAFT -GORE (N/A) as a surgical intervention .  The patient's history has been reviewed, patient examined, no change in status, stable for surgery.  I have reviewed the patient's chart and labs.  Questions were answered to the patient's satisfaction.     Josephina Gip

## 2013-04-29 NOTE — Anesthesia Postprocedure Evaluation (Signed)
  Anesthesia Post-op Note  Patient: [name] [name]  Procedure(s) Performed: Procedure(s): ABDOMINAL AORTIC ENDOVASCULAR STENT GRAFT -GORE (N/A)  Patient Location: PACU  Anesthesia Type:General  Level of Consciousness: awake and alert   Airway and Oxygen Therapy: Patient Spontanous Breathing  Post-op Pain: mild  Post-op Assessment: Post-op Vital signs reviewed  Post-op Vital Signs: stable  Complications: No apparent anesthesia complications

## 2013-04-29 NOTE — Op Note (Signed)
OPERATIVE REPORT  Date of Surgery: 04/29/2013  Surgeon: Josephina Gip, MD  Assistant: Dr. Durene Cal, Clearence Ped  Pre-op Diagnosis: Abdominal Aortic Aneurysm  Post-op Diagnosis: Abdominal Aortic Aneurysm  Procedure: Procedure(s): #1 bilateral common femoral access-percutaneous #2 insertion aorta common iliac-gGORE -C3-Excluder stent grafT       A 26 x 12 mm x 16 cm main body via right      B 12 mm x 14 cm contralateral limb via left #3 completion angiography #4 bilateral common femoral artery repair using Pro-glyde  suture closure device x4  Anesthesia: General  EBL: 100 cc   Complications: None  Procedure Details: the patient was taken to the operating room placed in supine position at which time satisfactory general endotracheal anesthesia was administered appropriate monitoring lines were placed by anesthesia. The abdomen and groins were prepped with Betadine scrub and solution draped in routine sterile manner. Both common femoral arteries were accessed percutaneously using the SonoSite ultrasound device and 2 probe glide closure devices were inserted on each side one at the 11:00 position one at the 1:00 position. Short 8 French sheath was placed on the right a long 8 French sheath on the left the patient was heparinized. Pigtail catheter was passed up the left side and an angiogram of the renal arteries the neck of the aneurysm was obtained injecting 10 cc of contrast at 10 cc per second. Having localized the location of the renal arteries the right sheath was exchanged over an Amplatz superstiff wire and a 16 French sheath was positioned just distal to the renal arteries. A 26 x 12 mm x 16 cm primary graft was selected and passed up the 16 French sheath in the right position just distal to the left renal artery and deployed. Second angiogram injecting 10 cc of contrast at 10 cc per second was performed to confirm location. It was slightly high on the left side which was the  lowest renal artery therefore it was reconstructing and moved down about half a centimeter and redeployed was in excellent position with the second angiogram. Graft was then deployed down to the contralateral gate. The gate was then cannulated using the long French sheath pigtail catheter from the left and a Bentson wire And  this was then exchanged for a superstiff Amplatz wire and a retrograde sheath a gram was performed from the left to measure the appropriate length graft. A 12 mm x 14 cm graft was selected and deployed appropriately proximal to the origin the hypogastric. Attention was returned to the right side where a retrograde sheath a gram was performed to localize the exact location of the right hypogastric. The remainder of the main body was deployed down into the right common iliac with terminating about 2 cm above the origin of the hypogastric. All junctions were then dilated using the Q.-50 balloon. Completion angiogram was performed injecting 20 cc of contrast at 20 cc per sec. There is no evidence of type I leaks or type II leaks. The graft was in excellent position. Following this the sheaths were removed over the Bentson wires and the provide closure device was utilized bilaterally at the 11 and 1:00 position with good hemostasis. Protamine was then given to reverse the heparin following adequate hemostasis the wounds were closed with simple 4-0 Vicryl subcuticular sutures and Dermabond there is palpable dorsalis pedis and posterior tibial pulses in the feet patient to the recovery room in stable condition having been stable hemodynamically throughout the case and excellent urinary  output   Josephina Gip, MD 04/29/2013 10:20 AM

## 2013-04-29 NOTE — Anesthesia Preprocedure Evaluation (Addendum)
Anesthesia Evaluation  Patient identified by MRN, date of birth, ID band Patient awake    Reviewed: Allergy & Precautions, H&P , NPO status , Patient's Chart, lab work & pertinent test results, reviewed documented beta blocker date and time   History of Anesthesia Complications Negative for: history of anesthetic complications  Airway Mallampati: II TM Distance: >3 FB Neck ROM: Full    Dental  (+) Teeth Intact and Dental Advisory Given   Pulmonary former smoker,  breath sounds clear to auscultation        Cardiovascular hypertension, Pt. on medications and Pt. on home beta blockers + Peripheral Vascular Disease Rhythm:Regular Rate:Normal     Neuro/Psych negative neurological ROS  negative psych ROS   GI/Hepatic Neg liver ROS, GERD-  Medicated and Controlled,  Endo/Other  negative endocrine ROS  Renal/GU negative Renal ROS     Musculoskeletal negative musculoskeletal ROS (+)   Abdominal   Peds  Hematology negative hematology ROS (+)   Anesthesia Other Findings   Reproductive/Obstetrics                          Anesthesia Physical Anesthesia Plan  ASA: III  Anesthesia Plan: General   Post-op Pain Management:    Induction:   Airway Management Planned: Oral ETT  Additional Equipment: Arterial line  Intra-op Plan:   Post-operative Plan: Extubation in OR  Informed Consent: I have reviewed the patients History and Physical, chart, labs and discussed the procedure including the risks, benefits and alternatives for the proposed anesthesia with the patient or authorized representative who has indicated his/her understanding and acceptance.   Dental advisory given  Plan Discussed with: CRNA and Anesthesiologist  Anesthesia Plan Comments:         Anesthesia Quick Evaluation

## 2013-04-30 ENCOUNTER — Encounter (HOSPITAL_COMMUNITY): Payer: Self-pay | Admitting: Vascular Surgery

## 2013-04-30 LAB — CBC
HCT: 35.9 % — ABNORMAL LOW (ref 39.0–52.0)
Hemoglobin: 12.4 g/dL — ABNORMAL LOW (ref 13.0–17.0)
MCH: 31 pg (ref 26.0–34.0)
MCHC: 34.5 g/dL (ref 30.0–36.0)
RBC: 4 MIL/uL — ABNORMAL LOW (ref 4.22–5.81)

## 2013-04-30 LAB — BASIC METABOLIC PANEL
BUN: 10 mg/dL (ref 6–23)
CO2: 25 mEq/L (ref 19–32)
Calcium: 8.7 mg/dL (ref 8.4–10.5)
GFR calc Af Amer: >90 mL/min (ref >90)
GFR calc non Af Amer: 86 mL/min — ABNORMAL LOW (ref >90)
Glucose, Bld: 157 mg/dL — ABNORMAL HIGH (ref 70–99)
Potassium: 3.1 mEq/L — ABNORMAL LOW (ref 3.5–5.1)
Sodium: 142 mEq/L (ref 135–145)

## 2013-04-30 NOTE — Progress Notes (Addendum)
[facility] Progress Note  04/30/2013 7:21 AM 1 Day Post-Op  Subjective:  "a little sore in the groins"  Tm 99.7 now 99.2 VSS 96% RA  Filed Vitals:   04/30/13 0400  BP: 132/62  Pulse: 73  Temp: 99.2 F (37.3 C)  Resp: 10    Physical Exam: Incisions:  Bilateral groins are soft without hematoma Extremities:  + palpable DP pulses bilaterally Cardiac:  RRR Lungs:  CTAB Abdomen:  Soft, NT/ND +BS  CBC    Component Value Date/Time   WBC 10.0 04/30/2013 0453   RBC 4.00* 04/30/2013 0453   HGB 12.4* 04/30/2013 0453   HCT 35.9* 04/30/2013 0453   PLT 111* 04/30/2013 0453   MCV 89.8 04/30/2013 0453   MCH 31.0 04/30/2013 0453   MCHC 34.5 04/30/2013 0453   RDW 13.1 04/30/2013 0453    BMET    Component Value Date/Time   NA 142 04/30/2013 0453   K 3.1* 04/30/2013 0453   CL 106 04/30/2013 0453   CO2 25 04/30/2013 0453   GLUCOSE 157* 04/30/2013 0453   BUN 10 04/30/2013 0453   CREATININE 0.90 04/30/2013 0453   CALCIUM 8.7 04/30/2013 0453   GFRNONAA 86* 04/30/2013 0453   GFRAA >90 04/30/2013 0453    INR    Component Value Date/Time   INR 1.02 04/29/2013 1031     Intake/Output Summary (Last 24 hours) at 04/30/13 0721 Last data filed at 04/30/13 0400  Gross per 24 hour  Intake 2198.33 ml  Output    695 ml  Net 1503.33 ml     Assessment:  67 y.o. male is s/p:  #1 bilateral common femoral access-percutaneous  #2 insertion aorta common iliac-gGORE -C3-Excluder stent grafT  A 26 x 12 mm x 16 cm main body via right  B 12 mm x 14 cm contralateral limb via left  #3 completion angiography  #4 bilateral common femoral artery repair using Pro-glyde suture closure device x4   1 Day Post-Op  Plan: -pt doing well this am -acute surgical blood loss anemia-tolerating -needs to void and eat this morning before discharging later this morning -will f/u with Dr. Hart Rochester in 4 weeks with CTA   Doreatha Massed, PA-C [facility] [phone] 04/30/2013 7:21 AM    Agree with above assessment Abdomen soft no pulsatile mass palpable Inguinal wounds look good 3+ dorsalis pedis and posterior tibial pulses palpable bilaterally Patient with good appetite no nausea  Plan DC home today return to office in 4 weeks with  CT angiogram

## 2013-04-30 NOTE — Discharge Summary (Signed)
[facility] Discharge Summary  [name] [name] April 03, 1946 67 y.o. male  [id number]  Admission Date: 04/29/2013  Discharge Date: 04/30/13  Physician: Pryor Ochoa, MD  Admission Diagnosis: Abdominal Aortic Aneurysm   HPI:   This is a 67 y.o. male returns for continued evaluation regarding his abdominal aortic aneurysm. Today he had a CT angiogram which I have reviewed by computer. The aneurysm measures 5.6 cm in maximum diameter and does appear to be a suitable candidate for aortic stent grafting. Patient also had a Cardiolite performed and was found to have an ejection fraction of 50% with no evidence of ischemia.  Hospital Course:  The patient was admitted to the hospital and taken to the operating room on 04/29/2013 and underwent: #1 bilateral common femoral access-percutaneous  #2 insertion aorta common iliac-gGORE -C3-Excluder stent grafT  A 26 x 12 mm x 16 cm main body via right  B 12 mm x 14 cm contralateral limb via left  #3 completion angiography  #4 bilateral common femoral artery repair using Pro-glyde suture closure device x4    The pt tolerated the procedure well and was transported to the PACU in good condition.   By POD 1, he was doing well.  He was able to void after foley was removed.  He did have acute surgical blood loss anemia, but did not receive transfusion as he is tolerating well.  The remainder of the hospital course consisted of increasing mobilization and increasing intake of solids without difficulty.  CBC    Component Value Date/Time   WBC 10.0 04/30/2013 0453   RBC 4.00* 04/30/2013 0453   HGB 12.4* 04/30/2013 0453   HCT 35.9* 04/30/2013 0453   PLT 111* 04/30/2013 0453   MCV 89.8 04/30/2013 0453   MCH 31.0 04/30/2013 0453   MCHC 34.5 04/30/2013 0453   RDW 13.1 04/30/2013 0453    BMET    Component Value Date/Time   NA 142 04/30/2013 0453   K 3.1* 04/30/2013 0453   CL 106 04/30/2013 0453   CO2 25 04/30/2013  0453   GLUCOSE 157* 04/30/2013 0453   BUN 10 04/30/2013 0453   CREATININE 0.90 04/30/2013 0453   CALCIUM 8.7 04/30/2013 0453   GFRNONAA 86* 04/30/2013 0453   GFRAA >90 04/30/2013 0453     Discharge Instructions:   The patient is discharged to home with extensive instructions on wound care and progressive ambulation.  They are instructed not to drive or perform any heavy lifting until returning to see the physician in his office.  Discharge Orders   Future Orders Complete By Expires   ABDOMINAL PROCEDURE/ANEURYSM REPAIR/AORTO-BIFEMORAL BYPASS:  Call MD for increased abdominal pain; cramping diarrhea; nausea/vomiting  As directed    Call MD for:  redness, tenderness, or signs of infection (pain, swelling, bleeding, redness, odor or green/yellow discharge around incision site)  As directed    Call MD for:  severe or increased pain, loss or decreased feeling  in affected limb(s)  As directed    Call MD for:  temperature >100.5  As directed    Driving Restrictions  As directed    Comments:     No driving for 2 weeks   Increase activity slowly  As directed    Comments:     Walk with assistance use walker or cane as needed   Lifting restrictions  As directed    Comments:     No lifting for 4 weeks   May shower   As directed  Scheduling Instructions:     Friday   may wash over wound with mild soap and water  As directed    Nursing communication  As directed    Scheduling Instructions:     Please give paper Rx to patient at discharge.  It is located on the paper chart.   Remove dressing in 24 hours  As directed    Resume previous diet  As directed       Discharge Diagnosis:  Abdominal Aortic Aneurysm  Secondary Diagnosis: Patient Active Problem List   Diagnosis Date Noted  . AAA (abdominal aortic aneurysm) without rupture 04/07/2013  . Abdominal aneurysm without mention of rupture 03/18/2012   Past Medical History  Diagnosis Date  . AAA (abdominal aortic aneurysm)   .  Hypertension   . Hyperlipidemia   . Reflux   . Arthritis     Gout  . Back pain   . GERD (gastroesophageal reflux disease)        Medication List         allopurinol 100 MG tablet  Commonly known as:  ZYLOPRIM  Take 200 mg by mouth daily.     furosemide 40 MG tablet  Commonly known as:  LASIX  Take 40 mg by mouth daily.     HYDROcodone-acetaminophen 5-325 MG per tablet  Commonly known as:  NORCO/VICODIN  Take 1 tablet by mouth every 4 (four) hours as needed for pain (for gout).     metoprolol succinate 50 MG 24 hr tablet  Commonly known as:  TOPROL-XL  Take 50 mg by mouth daily. Take with or immediately following a meal.     omeprazole 20 MG capsule  Commonly known as:  PRILOSEC  Take 20 mg by mouth daily.     rosuvastatin 20 MG tablet  Commonly known as:  CRESTOR  Take 20 mg by mouth daily.        Vicodin #30 No Refill  Disposition: home  Patient's condition: is Good  Follow up: 1. Dr. Hart Rochester in 4 weeks with CTA   Doreatha Massed, PA-C [facility] [phone] 04/30/2013  7:31 AM   - For VQI Registry use --- Instructions: Press F2 to tab through selections.  Delete question if not applicable.   Post-op:  Time to Extubation: [ ]  In OR, [ ]  < 12 hrs, [ ]  12-24 hrs, [ ]  >=24 hrs Vasopressors Req. Post-op: No MI: no, [ ]  Troponin only, [ ]  EKG or Clinical New Arrhythmia: No CHF: No ICU Stay: 1 day in stepdown Transfusion: No  If yes, n/a units given  Complications: Resp failure: no, [ ]  Pneumonia, [ ]  Ventilator Chg in renal function: no, [ ]  Inc. Cr > 0.5, [ ]  Temp. Dialysis, [ ]  Permanent dialysis Leg ischemia: no, no Surgery needed, [ ]  Yes, Surgery needed, [ ]  Amputation Bowel ischemia: no, [ ]  Medical Rx, [ ]  Surgical Rx Wound complication: no, [ ]  Superficial separation/infection, [ ]  Return to OR Return to OR: No  Return to OR for bleeding: No Stroke: no, [ ]  Minor, [ ]  Major  Discharge medications: Statin use:   Yes If No: [ ]  For Medical reasons, [ ]  Non-compliant, [ ]  Not-indicated ASA use:  No  If No: [ ]  For Medical reasons, [ ]  Non-compliant, [ ]  Not-indicated Plavix use:  No If No: [ ]  For Medical reasons, [ ]  Non-compliant, [ ]  Not-indicated Beta blocker use:  Yes If No: [ ]  For Medical reasons, [ ]   Non-compliant, [ ]  Not-indicated

## 2013-04-30 NOTE — Progress Notes (Signed)
Pt review D/C paper work and stated that he and family member at bedside understood instructions and signed paper work. Pt's IVs removed catheter is intact no redness tenderness or swelling at site. Pt wheeled off the unit via wheelchair accompanied by volunteer services.

## 2013-05-02 LAB — TYPE AND SCREEN
ABO/RH(D): O NEG
Unit division: 0

## 2013-05-22 ENCOUNTER — Other Ambulatory Visit: Payer: Self-pay | Admitting: *Deleted

## 2013-05-22 DIAGNOSIS — I714 Abdominal aortic aneurysm, without rupture: Secondary | ICD-10-CM

## 2013-06-08 ENCOUNTER — Encounter: Payer: Self-pay | Admitting: Vascular Surgery

## 2013-06-09 ENCOUNTER — Ambulatory Visit (INDEPENDENT_AMBULATORY_CARE_PROVIDER_SITE_OTHER): Payer: Self-pay | Admitting: Vascular Surgery

## 2013-06-09 ENCOUNTER — Encounter (INDEPENDENT_AMBULATORY_CARE_PROVIDER_SITE_OTHER): Payer: Self-pay

## 2013-06-09 ENCOUNTER — Ambulatory Visit
Admission: RE | Admit: 2013-06-09 | Discharge: 2013-06-09 | Disposition: A | Discharge status: Home / Self Care | Payer: Medicare Other | Source: Ambulatory Visit | Attending: Vascular Surgery | Admitting: Vascular Surgery

## 2013-06-09 ENCOUNTER — Encounter: Payer: Self-pay | Admitting: Vascular Surgery

## 2013-06-09 VITALS — BP 155/102 | HR 79 | Ht 69.0 in | Wt 192.0 lb

## 2013-06-09 DIAGNOSIS — I714 Abdominal aortic aneurysm, without rupture: Secondary | ICD-10-CM

## 2013-06-09 DIAGNOSIS — Z48812 Encounter for surgical aftercare following surgery on the circulatory system: Secondary | ICD-10-CM

## 2013-06-09 IMAGING — CT CT CTA ABD/PEL W/CM AND/OR W/O CM
3 of 10 series · 11 of 36 positions shown, 16 images · IV contrast (75CC OMNI 350)
Comparison: [DATE]

CLINICAL DATA: Status post endovascular aortic repair

EXAM:
CT ANGIOGRAPHY ABDOMEN AND PELVIS WITH CONTRAST AND WITHOUT CONTRAST
TECHNIQUE: Multidetector CT imaging of the abdomen and pelvis was performed
using the standard protocol prior to and during bolus administration
of intravenous contrast. Multiplanar reconstructed images including
MIPs were obtained and reviewed to evaluate the vascular anatomy.
CONTRAST:  80mL OMNIPAQUE IOHEXOL 350 MG/ML SOLN

[Series 5: angio 2.5 · axial · 0.78mm/px · z∈[-336,-8]mm · 5 of 197 slices shown, 10 images]
[im 33/197  soft-tissue]
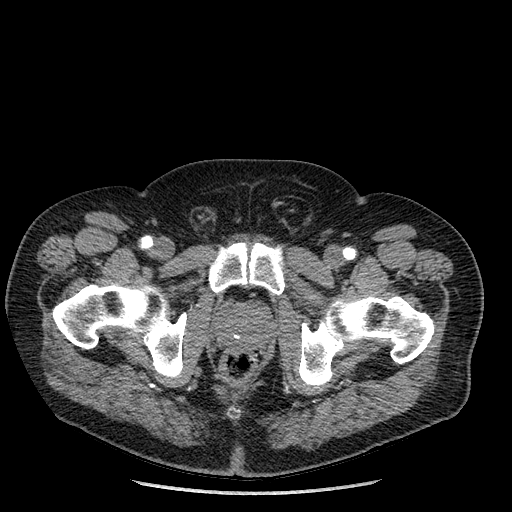
[im 33/197  bone]
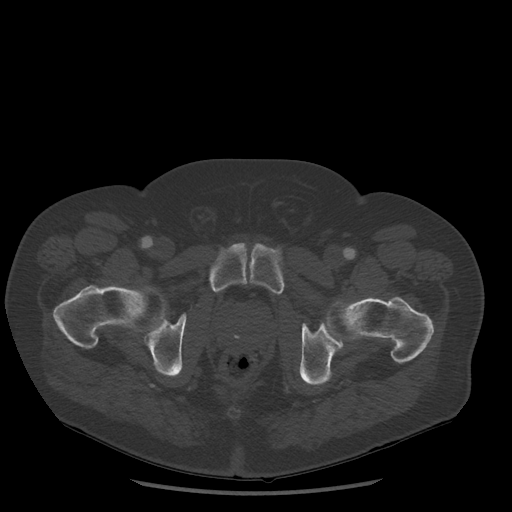
[im 66/197  soft-tissue]
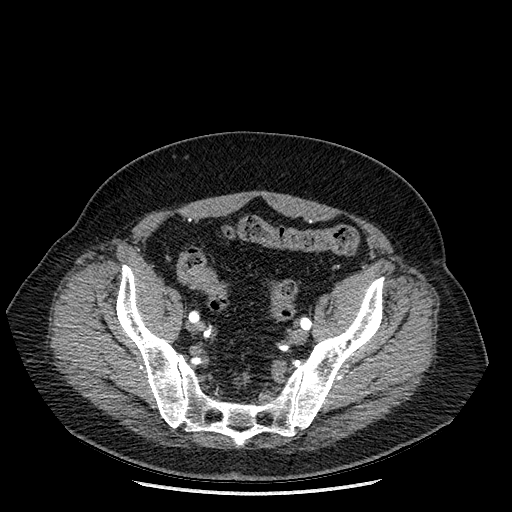
[im 66/197  lung]
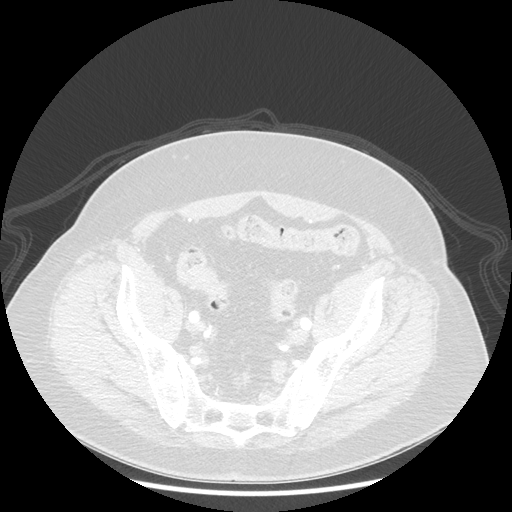
[im 99/197  soft-tissue]
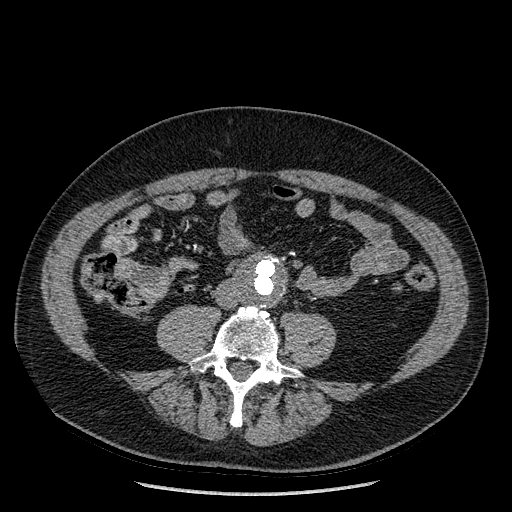
[im 99/197  lung]
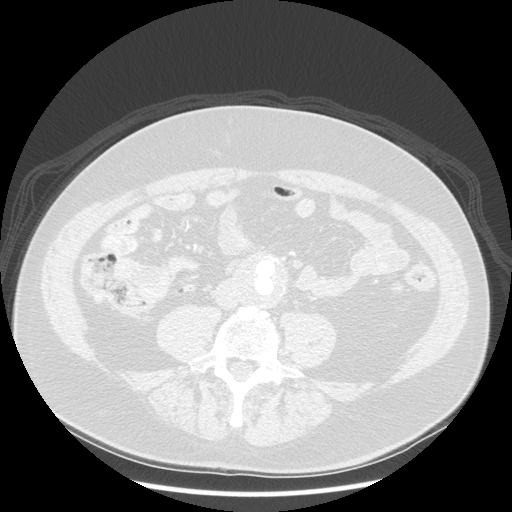
[im 131/197  soft-tissue]
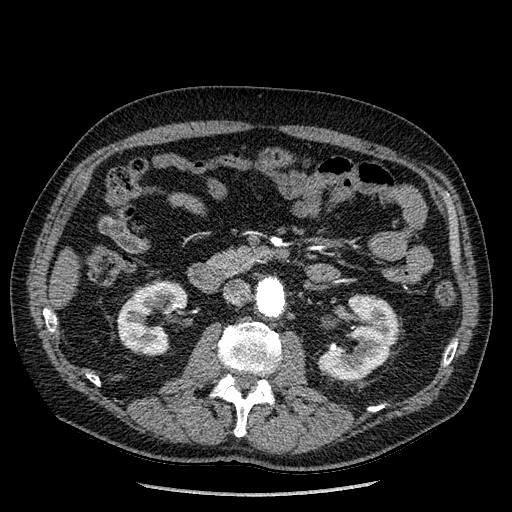
[im 131/197  lung]
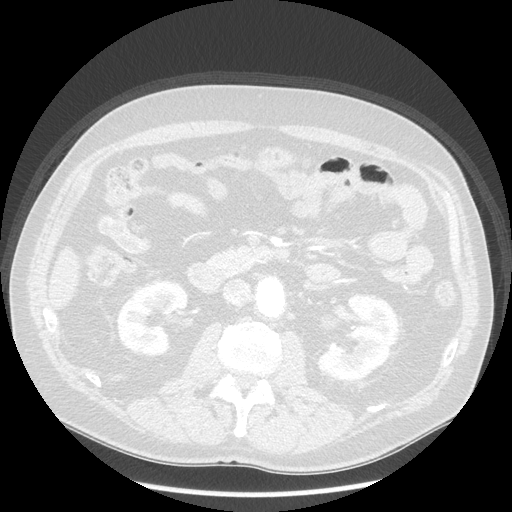
[im 164/197  soft-tissue]
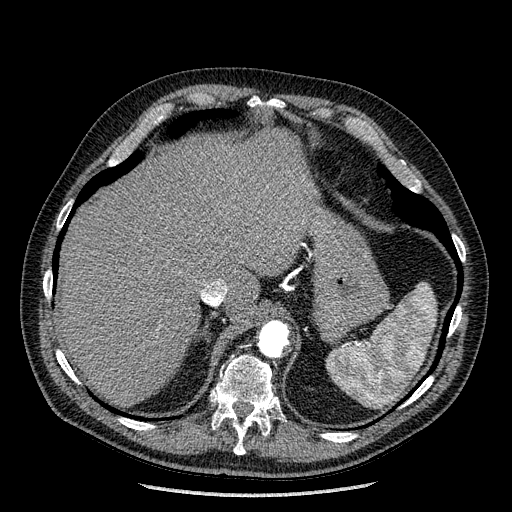
[im 164/197  lung]
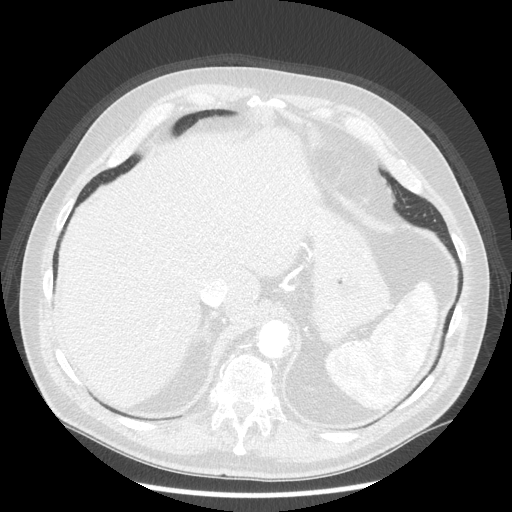

[Series 602: sagittal body · sagittal · 0.96mm/px · 3 of 160 slices shown (1 of 2)]
[im 40/160  soft-tissue]
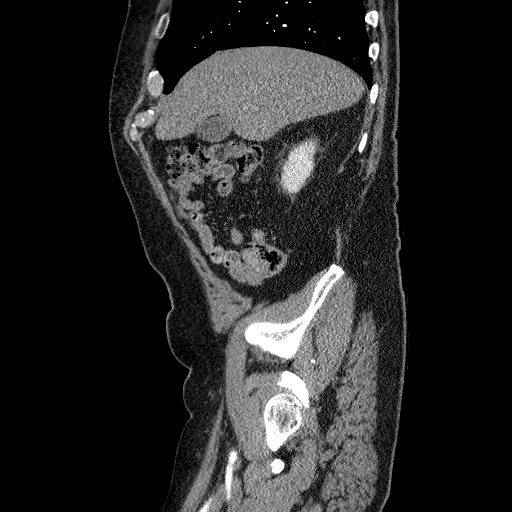
[im 80/160  soft-tissue]
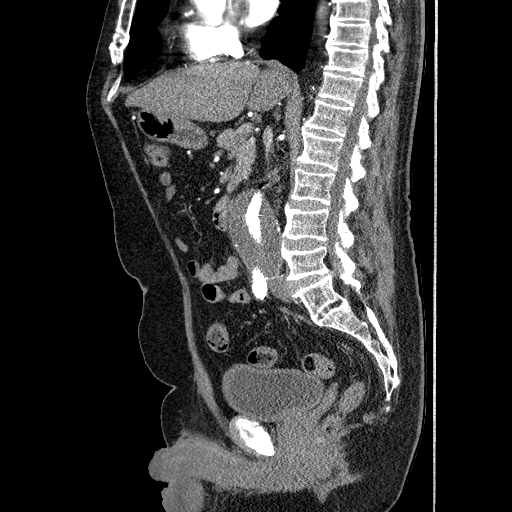
[im 120/160  soft-tissue]
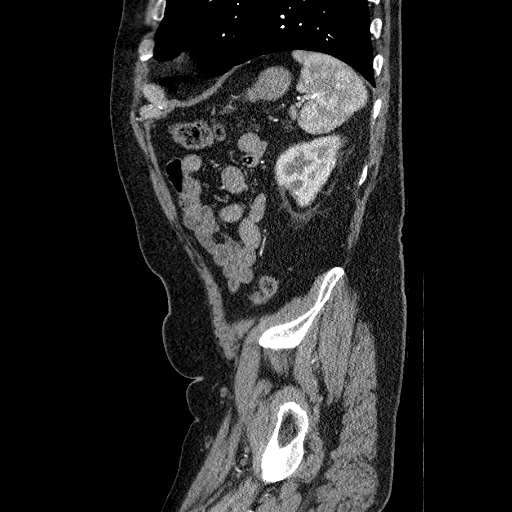

[Series 607: sagittal body · sagittal · 0.78mm/px · 3 of 156 slices shown (2 of 2)]
[im 39/156  soft-tissue]
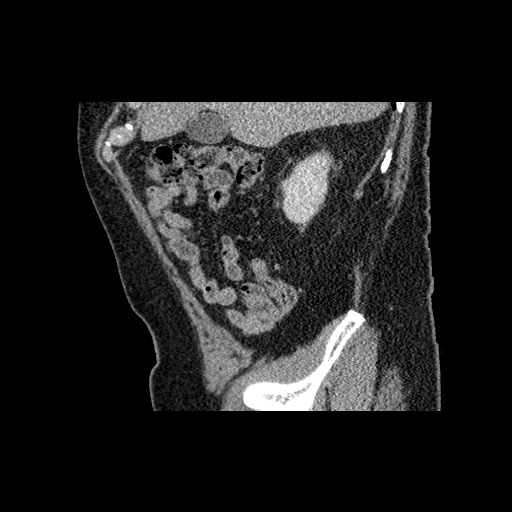
[im 78/156  soft-tissue]
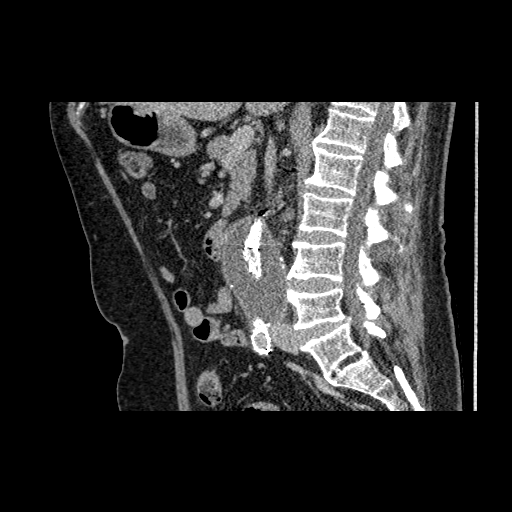
[im 117/156  soft-tissue]
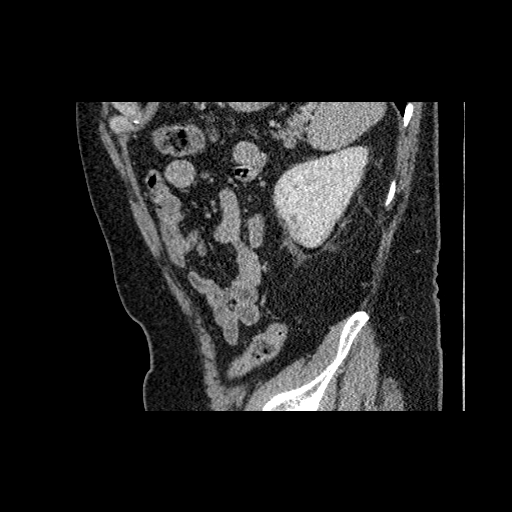

[11 of 36 positions shown; findings below may reference images not displayed]

FINDINGS: The lung bases are free of acute infiltrate or sizable effusion. The
precontrast images show no evidence of renal calculi or urinary
tract obstructive changes. The stent graft appears well positioned.

The liver, gallbladder, spleen, adrenal glands and pancreas are
normal in their CT appearance. Some scarring is noted in the
anterior aspect of the left kidney inferiorly. This was present on
the prior exam. The appendix is well visualized without inflammatory
change. No significant diverticular change is noted. The bladder is
well distended with non-opacified urine. Delayed images demonstrate
adequate excretion of contrast material.

Aorta: There is been interval placement of an aortic stent graft
with exclusion of the previously seen aneurysm sac. The aneurysm
continues to measure 5.6 x 5.3 cm in greatest transverse and AP
dimensions respectively. The stent graft is widely patent with flow
into the iliac vessels bilaterally. No focal narrowing is noted
within the stents. There are no findings to suggest endoleak at this
time.

Visceral vessels: The celiac axis and superior mesenteric artery are
widely patent. Single renal artery is noted on the right. A single
patent renal artery is noted on the left. The smaller accessory
artery supplying the lower pole of the left kidney is again noted to
be thrombosed. The inferior mesenteric artery is patent just beyond
its origin via reconstitution from the superior mesenteric artery.
At its origin however is excluded by the stent graft.

The osseous structures show degenerative change of the lumbar spine.
No acute abnormality is noted.

Review of the MIP images confirms the above findings.
IMPRESSION: The previously seen aneurysm has now been excluded by an aortic
stent graft. No endoleak is identified. The aneurysm sac is stable
from the prior exam.

Persistent scarring in the lower pole of the left kidney.

No new focal abnormality is seen.

## 2013-06-09 MED ORDER — IOHEXOL 350 MG/ML SOLN
80.0000 mL | Freq: Once | INTRAVENOUS | Status: AC | PRN
  Administered 2013-06-09: 80 mL via INTRAVENOUS

## 2013-06-09 NOTE — Progress Notes (Signed)
Subjective:     Patient ID: [name] [name], male   DOB: Nov 15, 1945, 67 y.o.   MRN: [id number]  HPI this 67 year old male returns 5 weeks post insertion of aortic stent graft for abdominal aortic aneurysm. He has some mild generalized malaise which occurs toward the end of each day but otherwise has no specific complaints. He said no, lower back symptoms. His ambulation has been good as has his appetite.  Past Medical History  Diagnosis Date  . AAA (abdominal aortic aneurysm)   . Hypertension   . Hyperlipidemia   . Reflux   . Arthritis     Gout  . Back pain   . GERD (gastroesophageal reflux disease)     History  Substance Use Topics  . Smoking status: Former Smoker -- 1.00 packs/day for 20 years    Types: Cigarettes    Quit date: 07/03/1983  . Smokeless tobacco: Never Used  . Alcohol Use: Yes    Family History  Problem Relation Age of Onset  . Diabetes Father   . Stroke Father   . Heart attack Brother     X's 2  . Coronary artery disease Mother   . Diabetes Sister     No Known Allergies  Current outpatient prescriptions:allopurinol (ZYLOPRIM) 100 MG tablet, Take 200 mg by mouth daily. , Disp: , Rfl: ;  furosemide (LASIX) 40 MG tablet, Take 40 mg by mouth daily., Disp: , Rfl: ;  HYDROcodone-acetaminophen (NORCO/VICODIN) 5-325 MG per tablet, Take 1 tablet by mouth every 4 (four) hours as needed for pain (for gout)., Disp: 20 tablet, Rfl: 0 metoprolol succinate (TOPROL-XL) 50 MG 24 hr tablet, Take 50 mg by mouth daily. Take with or immediately following a meal., Disp: , Rfl: ;  omeprazole (PRILOSEC) 20 MG capsule, Take 20 mg by mouth daily., Disp: , Rfl: ;  rosuvastatin (CRESTOR) 20 MG tablet, Take 20 mg by mouth daily., Disp: , Rfl:   BP 155/102  Pulse 79  Ht 5\' 9"  (1.753 m)  Wt 192 lb (87.091 kg)  BMI 28.34 kg/m2  SpO2 100%  Body mass index is 28.34 kg/(m^2).         Review of Systems denies chest pain, dyspnea on exertion, PND, orthopnea, hemoptysis,     Objective:   Physical Exam BP 155/102  Pulse 79  Ht 5\' 9"  (1.753 m)  Wt 192 lb (87.091 kg)  BMI 28.34 kg/m2  SpO2 100%  General well-developed well-nourished male no apparent stress alert and oriented x3 Lungs no rhonchi or wheezing Cardiovascular regular and no murmurs Abdomen soft nontender no postop mass noted 3+ femoral and 2+ posterior tibial pulses palpable bilaterally. Inguinal puncture sites are healing nicely  Today I ordered a CT angiogram which are reviewed by computer. The stent graft is in excellent position. Maximum diameter is approximately 55 mm similar to preop 56 No evidence of endoleak today      Assessment:     Doing well 5 weeks post insertion aortobifemoral iliac stent graft using the Gore-C3-excluder graft    Plan:     Return in 6 months for repeat CT angiogram for continued followup of stent graft

## 2013-12-07 ENCOUNTER — Encounter: Payer: Self-pay | Admitting: Vascular Surgery

## 2013-12-08 ENCOUNTER — Ambulatory Visit
Admission: RE | Admit: 2013-12-08 | Discharge: 2013-12-08 | Disposition: A | Discharge status: Home / Self Care | Payer: Medicare Other | Source: Ambulatory Visit | Attending: Vascular Surgery | Admitting: Vascular Surgery

## 2013-12-08 ENCOUNTER — Encounter: Payer: Self-pay | Admitting: Vascular Surgery

## 2013-12-08 ENCOUNTER — Ambulatory Visit (INDEPENDENT_AMBULATORY_CARE_PROVIDER_SITE_OTHER): Payer: Medicare Other | Admitting: Vascular Surgery

## 2013-12-08 VITALS — BP 110/69 | HR 85 | Ht 69.0 in | Wt 192.2 lb

## 2013-12-08 DIAGNOSIS — I714 Abdominal aortic aneurysm, without rupture, unspecified: Secondary | ICD-10-CM

## 2013-12-08 DIAGNOSIS — R911 Solitary pulmonary nodule: Secondary | ICD-10-CM

## 2013-12-08 DIAGNOSIS — Z48812 Encounter for surgical aftercare following surgery on the circulatory system: Secondary | ICD-10-CM

## 2013-12-08 DIAGNOSIS — I70209 Unspecified atherosclerosis of native arteries of extremities, unspecified extremity: Secondary | ICD-10-CM

## 2013-12-08 HISTORY — DX: Unspecified atherosclerosis of native arteries of extremities, unspecified extremity: I70.209

## 2013-12-08 IMAGING — CT CT CTA ABD/PEL W/CM AND/OR W/O CM
3 of 10 series · 10 of 36 positions shown, 15 images · IV contrast (60CC OMNI 350)
Comparison: CT [DATE]

CLINICAL DATA: Post aortic stent graft placement.

EXAM:
CT ANGIOGRAPHY ABDOMEN AND PELVIS
TECHNIQUE: Multidetector CT imaging of the abdomen and pelvis was performed
using the standard protocol during bolus administration of
intravenous contrast. Multiplanar reconstructed images including
MIPs were obtained and reviewed to evaluate the vascular anatomy.
CONTRAST:  60 ml Omnipaque 350

[Series 5: angio 2.5 · axial · 0.78mm/px · z∈[-335,-55]mm · 4 of 188 slices shown, 9 images]
[im 38/188  soft-tissue]
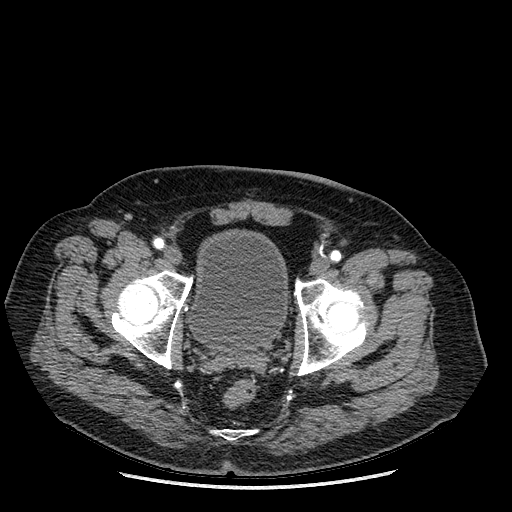
[im 38/188  lung]
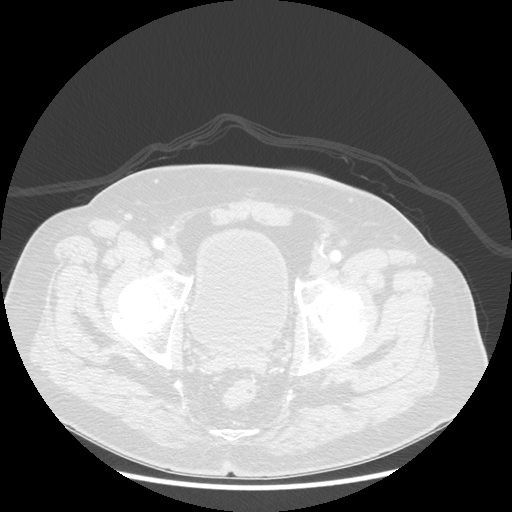
[im 38/188  bone]
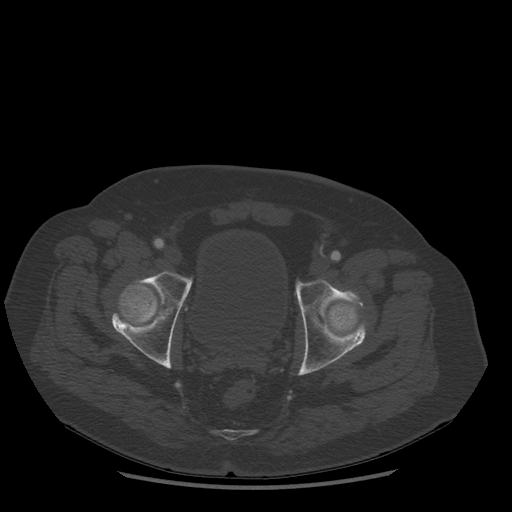
[im 75/188  soft-tissue]
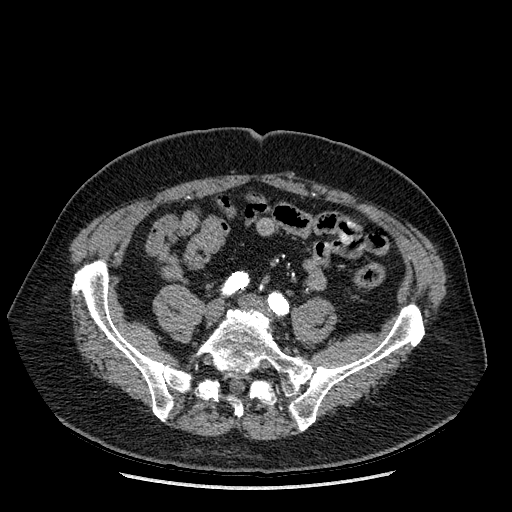
[im 75/188  lung]
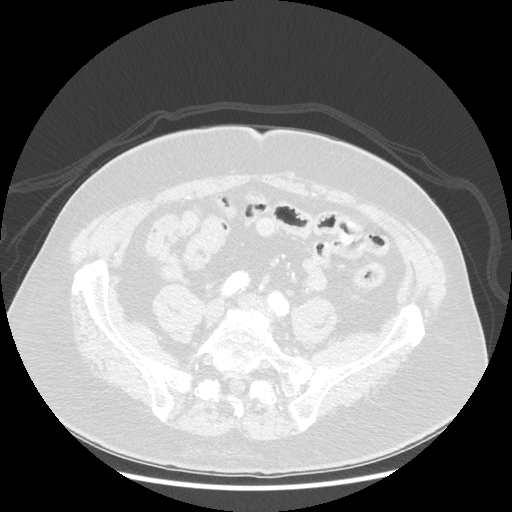
[im 113/188  soft-tissue]
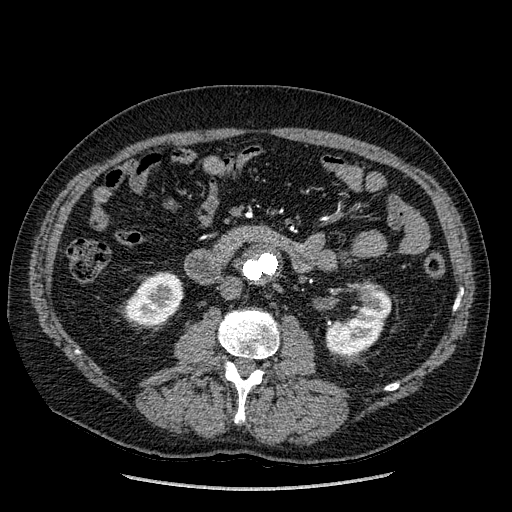
[im 113/188  lung]
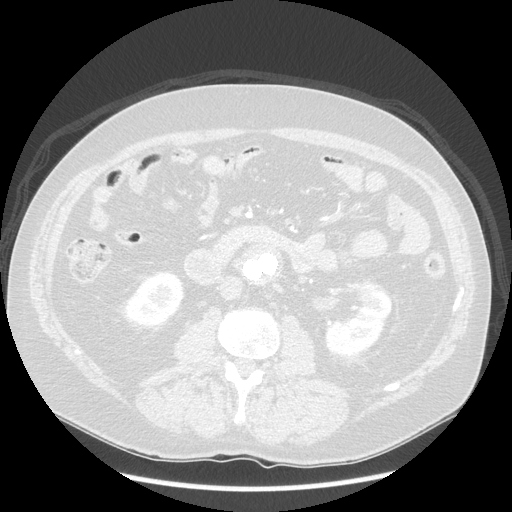
[im 150/188  soft-tissue]
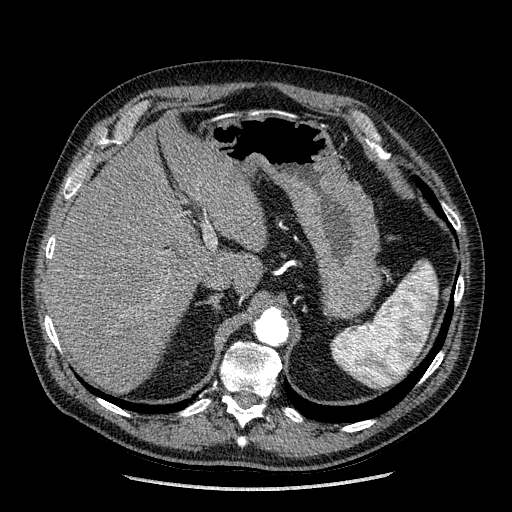
[im 150/188  lung]
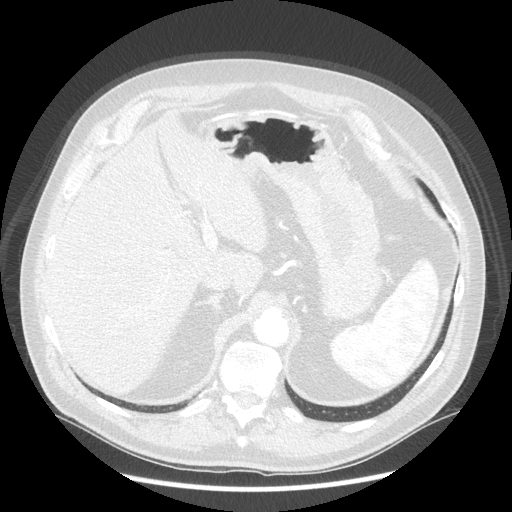

[Series 602: sagittal body · sagittal · 0.92mm/px · 3 of 159 slices shown (1 of 2)]
[im 40/159  soft-tissue]
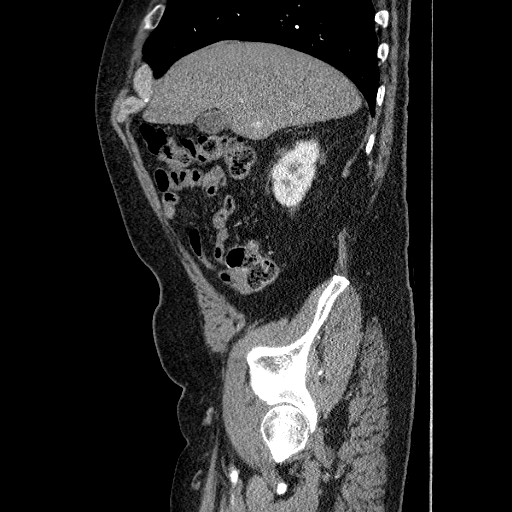
[im 80/159  soft-tissue]
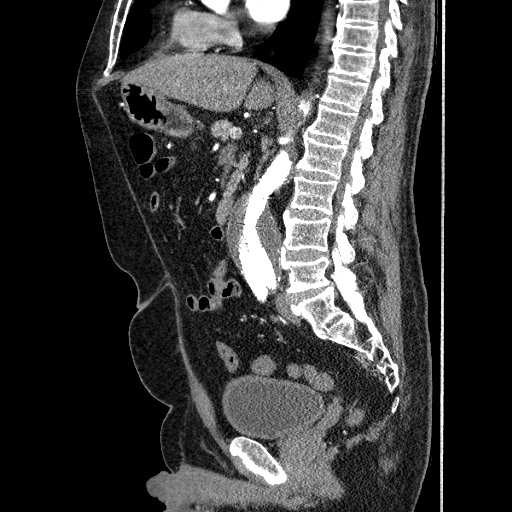
[im 119/159  soft-tissue]
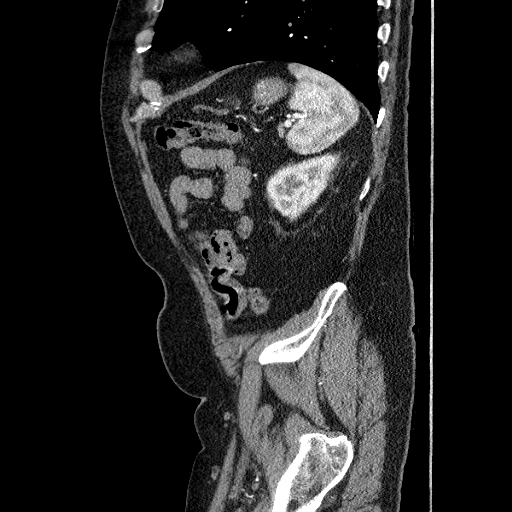

[Series 607: sagittal body · sagittal · 0.78mm/px · 3 of 154 slices shown (2 of 2)]
[im 39/154  soft-tissue]
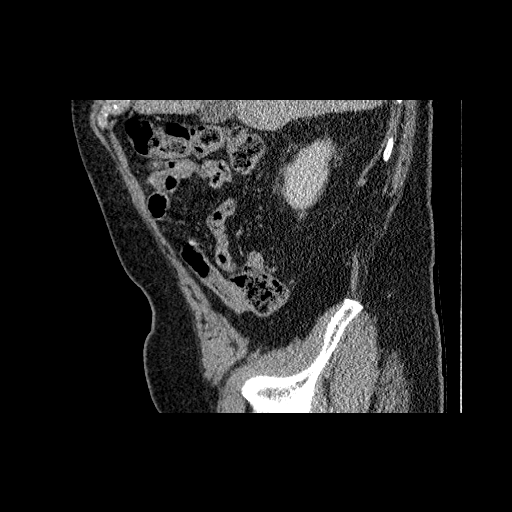
[im 77/154  soft-tissue]
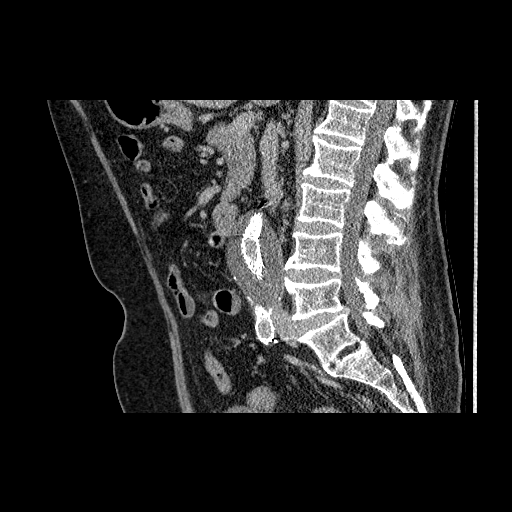
[im 115/154  soft-tissue]
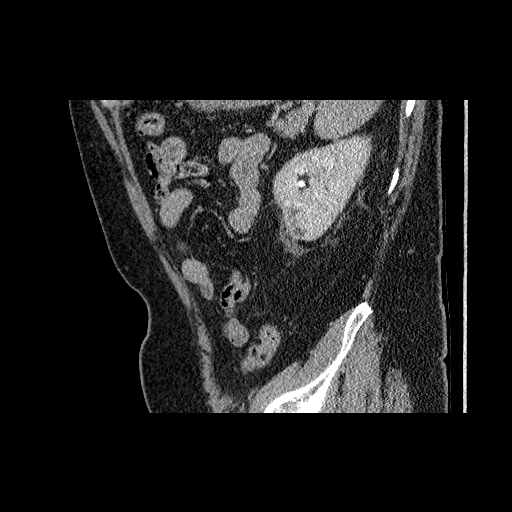

[10 of 36 positions shown; findings below may reference images not displayed]

FINDINGS: ARTERIAL FINDINGS:

Aorta: The aorta and stent graft are patent. No evidence for an
endoleak. The aortic aneurysm sac now measures 4.9 x 4.8 cm on
sequence 5, image 89. The sac previously measured 5.3 x 5.6 cm. No
evidence for an endoleak. Mural thrombus along the posterior aorta
just above the stent graft. Small amount of mural thrombus extending
into the posterior proximal stent graft.

Celiac axis: Celiac trunk and main branch vessels are patent.

Superior mesenteric: SMA is patent. Incidentally, the right hepatic
artery is replaced and originates from the SMA.

Left renal: Mild plaque near the origin without significant
stenosis.

Right renal:         Patent.

Inferior mesenteric: Origin of the IMA is occluded but there is flow
just beyond the origin. No evidence for an endoleak at this
location.

Left iliac: Stent graft extends into the left common iliac artery.
The left internal and external iliac arteries are patent. Proximal
left femoral arteries are patent.

Right iliac: Stent graft limb extends into the right common iliac
artery and patent. There is flow in the right internal iliac artery.
The right external iliac artery is patent without significant
dilatation or plaque. However, there is a critical stenosis in the
right common femoral artery with near occlusion. This finding is
similar to the prior examination. Proximal profunda and superficial
femoral arteries are patent.

Venous findings:     The IVC and proximal iliac veins are patent.

Review of the MIP images confirms the above findings.

NONVASCULAR FINDINGS:

There is a nodular structure in the right lower lobe which may be
bilobed. The nodule measures up to 1.3 cm on sequence 5, image 16. A
nodule has been present in this area since [DATE]. In [11], the
nodule roughly measures 0.8 cm. Findings suggest slow growth.
Remainder of the lung bases are clear. No evidence for kidney
stones. No gross abnormality to the liver, gallbladder, spleen,
pancreas, adrenal glands or kidneys. There appears to be stable
scarring involving the left kidney lower pole. Mild perinephric
stranding is also unchanged. No significant free fluid or
lymphadenopathy. No gross abnormality to the prostate or urinary
bladder. No gross abnormality to the small or large bowel. No acute
bone abnormality.
IMPRESSION: Patent aortic stent graft. No evidence for an endoleak. The aortic
aneurysm sac has decreased in size.

Critical focal stenosis in the right common femoral artery.

There is a bilobed nodule in the right lower lobe. This nodule has
demonstrated mild growth since [11]. This could represent a slow
growing hamartoma but an indolent neoplasm cannot be completely
excluded. This nodule could be further evaluated with a PET-CT to
look for hypermetabolic activity.

Stable scarring involving the left kidney lower pole.

These results were called by telephone at the time of interpretation
on [DATE] at [DATE] to Dr. PINKNEY , who verbally
acknowledged these results.

## 2013-12-08 MED ORDER — IOHEXOL 350 MG/ML SOLN
60.0000 mL | Freq: Once | INTRAVENOUS | Status: AC | PRN
  Administered 2013-12-08: 60 mL via INTRAVENOUS

## 2013-12-08 NOTE — Addendum Note (Signed)
Addended by: Mena Goes on: 12/08/2013 03:49 PM   Modules accepted: Orders

## 2013-12-08 NOTE — Progress Notes (Signed)
Subjective:     Patient ID: [name] [name], male   DOB: 06-Apr-1946, 68 y.o.   MRN: [id number]  HPI this 68 year old male returns for initial followup regarding his abdominal aortic stent graft which I inserted in October of 2015 for an abdominal aortic aneurysm . He has done very well from that standpoint. He denies any abdominal or back symptoms. He denies any claudication symptoms in the lower extremity and is able to angulate several blocks. He has had no chest pain.  Past Medical History  Diagnosis Date  . AAA (abdominal aortic aneurysm)   . Hypertension   . Hyperlipidemia   . Reflux   . Arthritis     Gout  . Back pain   . GERD (gastroesophageal reflux disease)   . Diabetes mellitus without complication     History  Substance Use Topics  . Smoking status: Former Smoker -- 1.00 packs/day for 20 years    Types: Cigarettes    Quit date: 07/03/1983  . Smokeless tobacco: Never Used  . Alcohol Use: 7.2 oz/week    12 Cans of beer per week    Family History  Problem Relation Age of Onset  . Diabetes Father   . Stroke Father   . Heart attack Father   . Heart attack Brother     X's 2  . Coronary artery disease Mother   . Diabetes Mother   . Heart attack Mother   . Diabetes Sister     No Known Allergies  Current outpatient prescriptions:allopurinol (ZYLOPRIM) 100 MG tablet, Take 200 mg by mouth daily. , Disp: , Rfl: ;  furosemide (LASIX) 40 MG tablet, Take 40 mg by mouth daily., Disp: , Rfl: ;  glimepiride (AMARYL) 1 MG tablet, Take 1 mg by mouth daily with breakfast. , Disp: , Rfl: ;  HYDROcodone-acetaminophen (NORCO/VICODIN) 5-325 MG per tablet, Take 1 tablet by mouth every 4 (four) hours as needed for pain (for gout)., Disp: 20 tablet, Rfl: 0 lisinopril (PRINIVIL,ZESTRIL) 10 MG tablet, Take 10 mg by mouth daily. , Disp: , Rfl: ;  metoprolol succinate (TOPROL-XL) 50 MG 24 hr tablet, Take 50 mg by mouth daily. Take with or immediately following a meal., Disp: , Rfl: ;  omeprazole  (PRILOSEC) 20 MG capsule, Take 20 mg by mouth daily., Disp: , Rfl: ;  rosuvastatin (CRESTOR) 20 MG tablet, Take 10 mg by mouth daily. , Disp: , Rfl: ;  INVOKANA 300 MG TABS, , Disp: , Rfl:   BP 110/69  Pulse 85  Ht 5\' 9"  (1.753 m)  Wt 192 lb 3.2 oz (87.181 kg)  BMI 28.37 kg/m2  SpO2 98%  Body mass index is 28.37 kg/(m^2).           Review of Systems denies chest pain, dyspnea on exertion, PND, orthopnea, hemoptysis, claudication. All systems negative complete review of systems    Objective:   Physical Exam BP 110/69  Pulse 85  Ht 5\' 9"  (1.753 m)  Wt 192 lb 3.2 oz (87.181 kg)  BMI 28.37 kg/m2  SpO2 98%  Gen.-alert and oriented x3 in no apparent distress HEENT normal for age Lungs no rhonchi or wheezing Cardiovascular regular rhythm no murmurs carotid pulses 3+ palpable no bruits audible Abdomen soft nontender no palpable masses Musculoskeletal free of  major deformities Skin clear -no rashes Neurologic normal Lower extremities 3+ femoral and dorsalis pedis pulses palpable on the left. Right leg with 1+ femoral and 1+ Damita Lack is pedis pulse palpable. Both feet adequately perfused.  Today  I ordered a CT angiogram of the abdomen and pelvis which I reviewed and interpreted. The aneurysm sac continues to shrink there is no evidence of significant endoleak. There is however a small bilobed nodule in the right lower lung field which has been present since 2008 but has enlarged according to the radiologist today There is also severe stenosis in the right common femoral artery at about the level of the inguinal ligament which appears to be quite severe.       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

## 2013-12-09 ENCOUNTER — Other Ambulatory Visit: Payer: Self-pay | Admitting: *Deleted

## 2013-12-09 ENCOUNTER — Telehealth: Payer: Self-pay | Admitting: Vascular Surgery

## 2013-12-09 DIAGNOSIS — R911 Solitary pulmonary nodule: Secondary | ICD-10-CM

## 2013-12-09 NOTE — Telephone Encounter (Signed)
Spoke with "wife" - gave her the following info: CT chest per [facility] Dr. Roxan Hockey for 12/10/13 9:45 am 301 - no prep [facility]CTS Dr. Roxan Hockey 12/15/13  3:45 am CTA and fu with [facility] on 06/15/14 - letter sent Wife verbalized understanding.

## 2013-12-10 ENCOUNTER — Ambulatory Visit
Admission: RE | Admit: 2013-12-10 | Discharge: 2013-12-10 | Disposition: A | Discharge status: Home / Self Care | Payer: Medicare Other | Source: Ambulatory Visit | Attending: Vascular Surgery | Admitting: Vascular Surgery

## 2013-12-10 DIAGNOSIS — R911 Solitary pulmonary nodule: Secondary | ICD-10-CM

## 2013-12-10 IMAGING — CT CT CHEST W/ CM
2 of 3 series · 14 of 31 positions shown, 16 images · IV contrast (50CC OMNI 300)
Comparison: [DATE] [DATE], [DATE] ; [DATE] [DATE], [DATE] ; [DATE] [DATE], [DATE]

CLINICAL DATA: Pulmonary nodule

EXAM:
CT CHEST WITH CONTRAST
TECHNIQUE: Multidetector CT imaging of the chest was performed during
intravenous contrast administration.
CONTRAST:  50mL OMNIPAQUE IOHEXOL 300 MG/ML  SOLN

[Series 3: chest with · axial · 0.78mm/px · z∈[-244,-40]mm · 6 of 59 slices shown, 8 images]
[im 9/59  mediastinal]
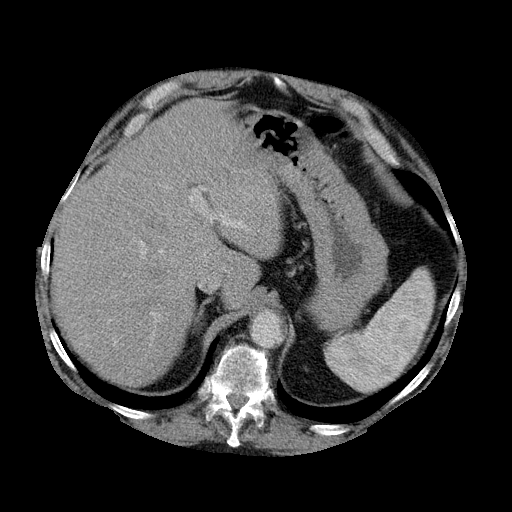
[im 9/59  lung]
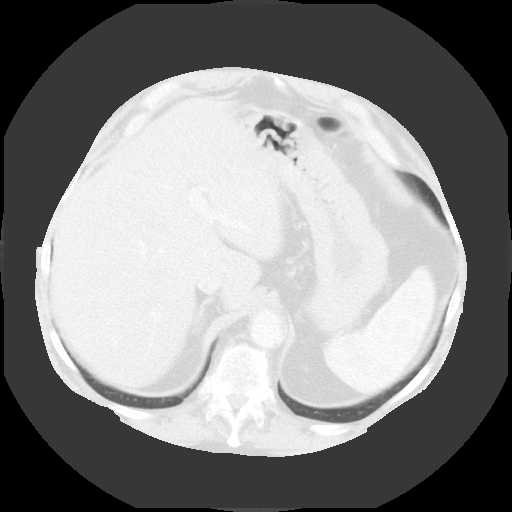
[im 17/59  lung]
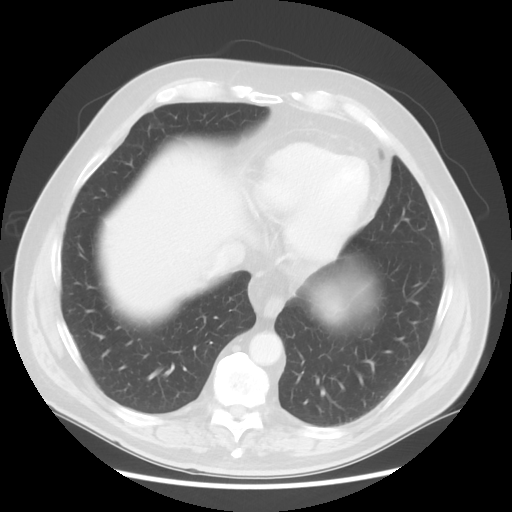
[im 25/59  lung]
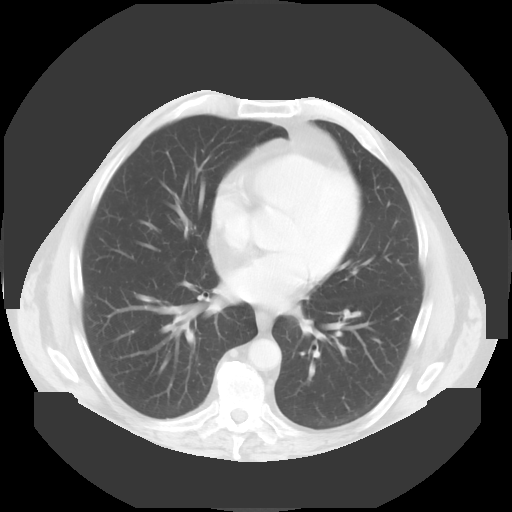
[im 34/59  lung]
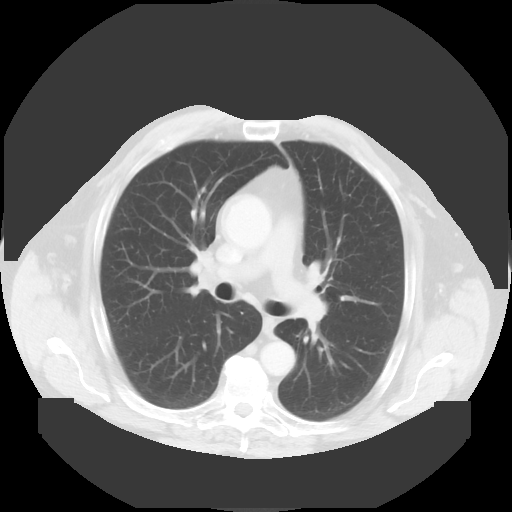
[im 42/59  mediastinal]
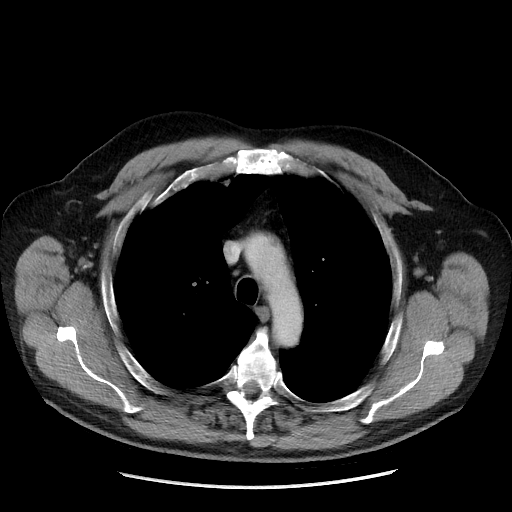
[im 42/59  lung]
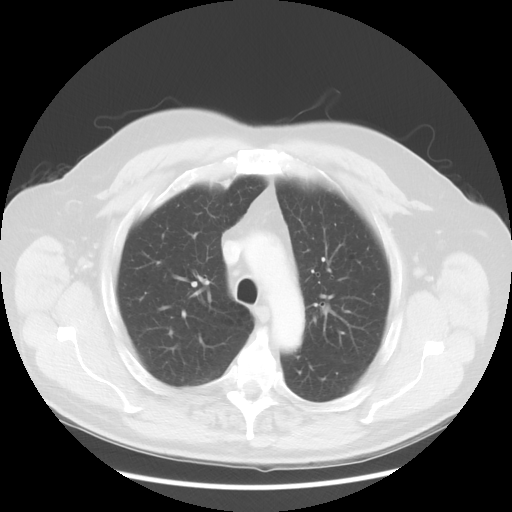
[im 50/59  lung]
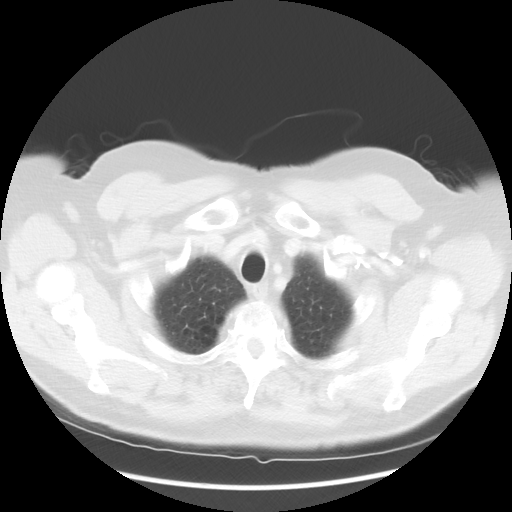

[Series 602: sagittal body · sagittal · 0.78mm/px · 8 of 161 slices shown]
[im 17/161  mediastinal]
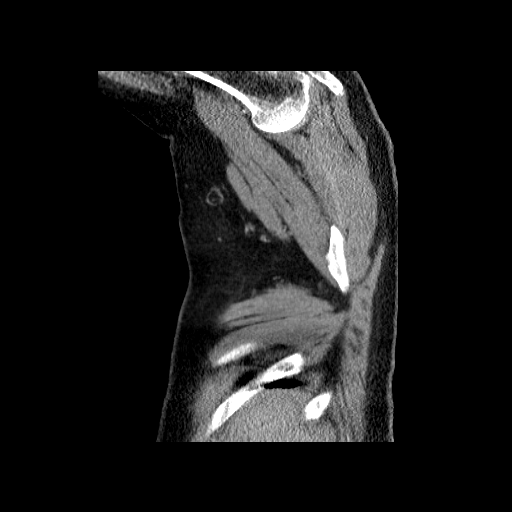
[im 34/161  mediastinal]
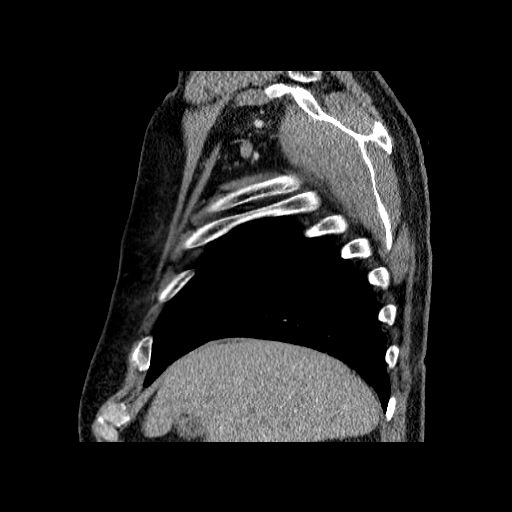
[im 51/161  mediastinal]
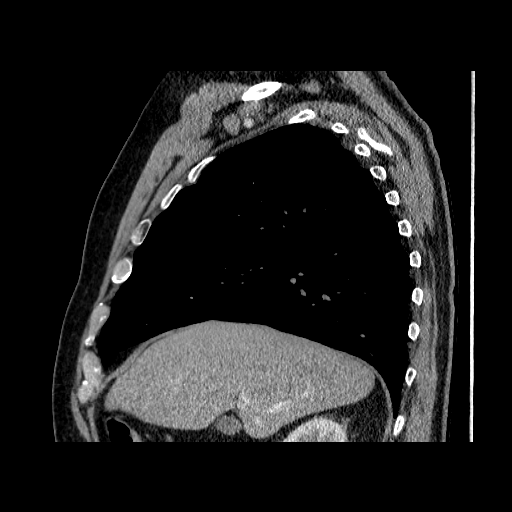
[im 76/161  mediastinal]
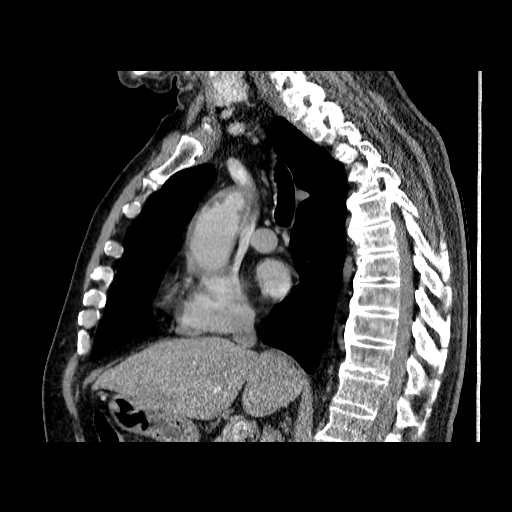
[im 85/161  mediastinal]
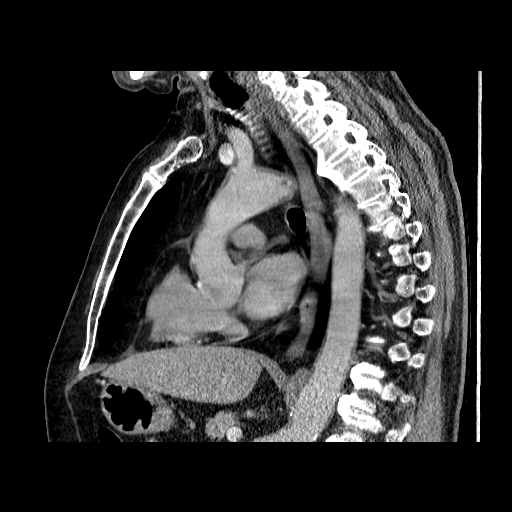
[im 110/161  mediastinal]
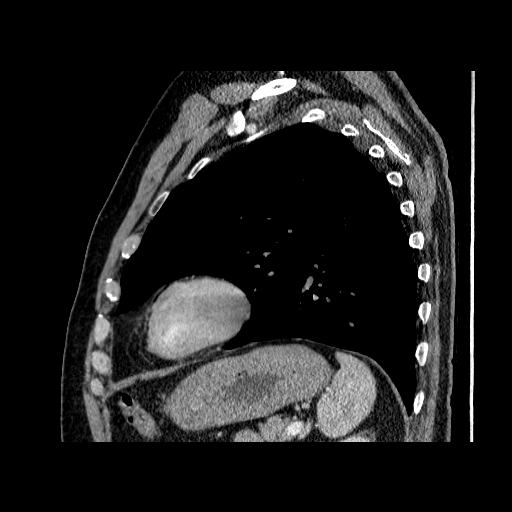
[im 127/161  mediastinal]
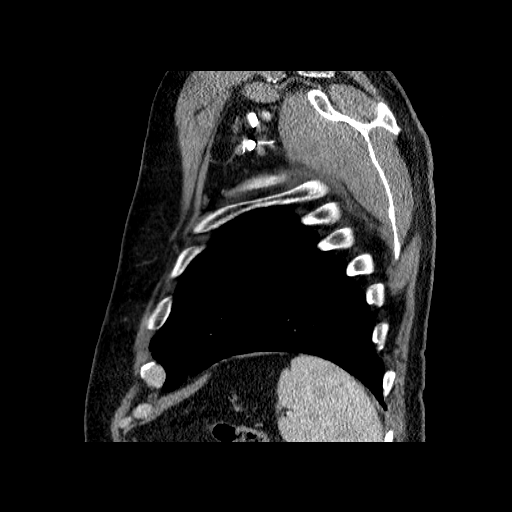
[im 144/161  mediastinal]
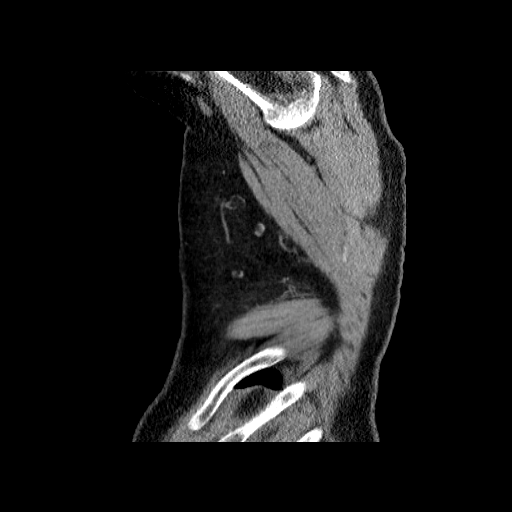

[14 of 31 positions shown; findings below may reference images not displayed]

FINDINGS: There is underlying centrilobular emphysematous change. There is a
nodular opacity in the posterior segment of the right lower lobe
measuring 1.3 x 0.8 cm which is stable in appearance compared to the
prior study from [46] but has increased in size since [46]. There is
no other appreciable parenchymal lung mass lesion.

There is no appreciable thoracic adenopathy. Pericardium is not
thickened. There is atherosclerotic change in the aorta. Mild
prominence of the ascending aorta with a maximum transverse diameter
of 3.9 x 3.9 cm remain stable. There are areas of coronary artery
calcification noted.

In the visualized upper abdomen, there is a small stable mass
arising from the inferior left adrenal measuring 1.4 x 1.1 cm, most
likely an adenoma given stability since the prior study. There is
fatty change in the liver. There is degenerative change in the
thoracic spine. There are no blastic or lytic bone lesions.
IMPRESSION: 1.3 x 0.8 cm nodular opacity in the posterior segment of the right
lower lobe, stable from [46] but increased in size from [46]. Given
a change since [46], this nodule warrants close clinical and imaging
surveillance. At a minimum, a followup CT in 3 months to assess for
stability would be advised. If more aggressive surveillance is felt
to be warranted, PET-CT at this time would be reasonable. No other
pulmonary nodular lesions are identified. There is no appreciable
thoracic adenopathy. A small left adrenal mass remain stable
compared to [46] study. There is underlying centrilobular emphysema.

## 2013-12-10 MED ORDER — IOHEXOL 300 MG/ML  SOLN
50.0000 mL | Freq: Once | INTRAMUSCULAR | Status: AC | PRN
  Administered 2013-12-10: 50 mL via INTRAVENOUS

## 2013-12-15 ENCOUNTER — Encounter: Payer: Self-pay | Admitting: Thoracic Surgery (Cardiothoracic Vascular Surgery)

## 2013-12-15 ENCOUNTER — Institutional Professional Consult (permissible substitution) (INDEPENDENT_AMBULATORY_CARE_PROVIDER_SITE_OTHER): Payer: Medicare Other | Admitting: Thoracic Surgery (Cardiothoracic Vascular Surgery)

## 2013-12-15 VITALS — BP 116/72 | HR 82 | Resp 18 | Ht 69.0 in | Wt 196.0 lb

## 2013-12-15 DIAGNOSIS — R911 Solitary pulmonary nodule: Secondary | ICD-10-CM

## 2013-12-15 NOTE — Progress Notes (Signed)
PCP is [name], MD Referring Provider is Mal Misty, MD  Chief Complaint  Patient presents with  . Lung Lesion    EVAL FOR LUNG NODULE, CTA DONE 6/9    HPI: [name] [name] is a 68 year old man sent for consultation regarding a lung nodule.  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 He recently had a CT of the abdomen and pelvis for followup after a stent graft for an abdominal aortic aneurysm. On that scan he was noted to have a 13 x 8 mm bilobed nodule in the right lower lobe. At perspective review of his previous CT scan showed this lesion is been present since 2008. It was 8 mm at that time. The nodule showed no growth in the interval between October of 2014 and the most recent scan.  He says he has had a productive cough recently. He denies hemoptysis. He hears himself wheezing sometimes at night but it resolves when he clears his throat. He does not have any chest pain, tightness, or pressure. He denies shortness of breath. His weight has been stable. His appetite is good.  Past Medical History  Diagnosis Date  . AAA (abdominal aortic aneurysm)   . Hypertension   . Hyperlipidemia   . Reflux   . Arthritis     Gout  . Back pain   . GERD (gastroesophageal reflux disease)   . Diabetes mellitus without complication     Past Surgical History  Procedure Laterality Date  . Vasectomy    . Colonoscopy  2010  . Abdominal aortic endovascular stent graft N/A 04/29/2013    Procedure: ABDOMINAL AORTIC ENDOVASCULAR STENT GRAFT -GORE;  Surgeon: Mal Misty, MD;  Location: [facility] OR;  Service: Vascular;  Laterality: N/A;    Family History  Problem Relation Age of Onset  . Diabetes Father   . Stroke Father   . Heart attack Father   . Heart attack Brother     X's 2  . Coronary artery disease Mother   . Diabetes Mother   . Heart attack Mother   . Diabetes Sister     Social History History  Substance Use Topics  .  Smoking status: Former Smoker -- 1.00 packs/day for 20 years    Types: Cigarettes    Quit date: 07/03/1983  . Smokeless tobacco: Never Used  . Alcohol Use: 7.2 oz/week    12 Cans of beer per week    Current Outpatient Prescriptions  Medication Sig Dispense Refill  . allopurinol (ZYLOPRIM) 100 MG tablet Take 200 mg by mouth daily.       . furosemide (LASIX) 40 MG tablet Take 40 mg by mouth daily.      Marland Kitchen glimepiride (AMARYL) 1 MG tablet Take 1 mg by mouth daily with breakfast.       . HYDROcodone-acetaminophen (NORCO/VICODIN) 5-325 MG per tablet Take 1 tablet by mouth every 4 (four) hours as needed for pain (for gout).  20 tablet  0  . lisinopril (PRINIVIL,ZESTRIL) 10 MG tablet Take 10 mg by mouth daily.       . metoprolol succinate (TOPROL-XL) 50 MG 24 hr tablet Take 50 mg by mouth daily. Take with or immediately following a meal.      . omeprazole (PRILOSEC) 20 MG capsule Take 20 mg by mouth daily.      . rosuvastatin (CRESTOR) 20 MG tablet Take 10 mg by mouth daily.        No  current facility-administered medications for this visit.    No Known Allergies  Review of Systems  Constitutional: Negative for fever, chills, appetite change and unexpected weight change.       Decrease energy  Respiratory: Positive for cough (productive) and wheezing (at night, resolves with clearing throat).   Cardiovascular: Negative for chest pain.  Genitourinary: Positive for frequency.  All other systems reviewed and are negative.   BP 116/72  Pulse 82  Resp 18  Ht 5\' 9"  (1.753 m)  Wt 196 lb (88.905 kg)  BMI 28.93 kg/m2  SpO2 98% Physical Exam  Vitals reviewed. Constitutional: He is oriented to person, place, and time. He appears well-developed and well-nourished. No distress.  HENT:  Head: Normocephalic and atraumatic.  Eyes: EOM are normal. Pupils are equal, round, and reactive to light.  Neck: Neck supple. No thyromegaly present.  Cardiovascular: Normal rate, regular rhythm and normal  heart sounds.  Exam reveals no gallop and no friction rub.   No murmur heard. Pulmonary/Chest: Effort normal and breath sounds normal. He has no wheezes.  Abdominal: Soft. There is no tenderness.  Musculoskeletal: He exhibits no edema.  Lymphadenopathy:    He has no cervical adenopathy.  Neurological: He is alert and oriented to person, place, and time. No cranial nerve deficit.  No focal motor deficit  Skin: Skin is warm and dry.     Diagnostic Tests: CT chest CHEST WITH CONTRAST  TECHNIQUE:  Multidetector CT imaging of the chest was performed during  intravenous contrast administration.  CONTRAST: 64mL OMNIPAQUE IOHEXOL 300 MG/ML SOLN  COMPARISON: December 08, 2013 ; June 09, 2013 ; July 02, 2007  FINDINGS:  There is underlying centrilobular emphysematous change. There is a  nodular opacity in the posterior segment of the right lower lobe  measuring 1.3 x 0.8 cm which is stable in appearance compared to the  prior study from 2014 but has increased in size since 2008. There is  no other appreciable parenchymal lung mass lesion.  There is no appreciable thoracic adenopathy. Pericardium is not  thickened. There is atherosclerotic change in the aorta. Mild  prominence of the ascending aorta with a maximum transverse diameter  of 3.9 x 3.9 cm remain stable. There are areas of coronary artery  calcification noted.  In the visualized upper abdomen, there is a small stable mass  arising from the inferior left adrenal measuring 1.4 x 1.1 cm, most  likely an adenoma given stability since the prior study. There is  fatty change in the liver. There is degenerative change in the  thoracic spine. There are no blastic or lytic bone lesions.  IMPRESSION:  1.3 x 0.8 cm nodular opacity in the posterior segment of the right  lower lobe, stable from 2014 but increased in size from 2008. Given  a change since 2008, this nodule warrants close clinical and imaging  surveillance. At a minimum, a  followup CT in 3 months to assess for  stability would be advised. If more aggressive surveillance is felt  to be warranted, PET-CT at this time would be reasonable. No other  pulmonary nodular lesions are identified. There is no appreciable  thoracic adenopathy. A small left adrenal mass remain stable  compared to 2008 study. There is underlying centrilobular emphysema.  Electronically Signed  By: Lowella Grip M.D.  On: 12/10/2013 10:54  Impression: 68 year old man with a right lower lobe lung nodule that was noted on a recent CT of the abdomen and pelvis. CT of the chest confirmed  the nodule, there were no other suspicious findings. Interestingly this nodule has been visible on CT scan back to 2008. It is definitely increase in size from 8 to 13 mm over that 7 year period of time. There has been no change in size in the interval since his CT in October and now 10 months later in June.  I reviewed the CT scans with [name] [name]. We discussed the differential diagnosis. This lesion is well circumscribed and has grown minimally 7 year period of time and shows no growth over the past 10 months. By far the most likely scenario is that this is a benign tumor such as a hamartoma. It is highly doubtful that it is lung cancer.  I discussed the option of surgical resection for definitive diagnosis and treatment versus continued radiographic observation. I think he could tolerate a wedge resection of the nodule easily with minimal risk. I described the operation to him including the use of general anesthesia, the incisions, the expected hospital stay and overall recovery. We briefly discussed the indications, risks, benefits, and alternatives. He understands the risk include, but are not limited to death, MI, DVT, PE, stroke, bleeding, possible need for transfusion, infection, prolonged air leak, as well as the possibility of unforeseeable complications.  We discussed the option of radiographic  observation. They obviously outside this is that if the lesion is a malignancy he could become more aggressive or invasive over time and could potentially spread in the interval between scans. I think this is a very low likelihood given the appearance of the lesion.  [name] [name] is not interested in having a surgical resection at this time. He wishes to be followed. He is scheduled to have another CT of the abdomen in 6 months. I will contact Dr. Kellie Simmering CT of the lung to go ahead and scan his chest at that time and I could see him back in followup.  Plan: Return in 6 months with CT of chest

## 2014-06-14 ENCOUNTER — Encounter: Payer: Self-pay | Admitting: Vascular Surgery

## 2014-06-15 ENCOUNTER — Ambulatory Visit (INDEPENDENT_AMBULATORY_CARE_PROVIDER_SITE_OTHER)
Admission: RE | Admit: 2014-06-15 | Discharge: 2014-06-15 | Disposition: A | Discharge status: Home / Self Care | Payer: Medicare Other | Source: Ambulatory Visit | Attending: Vascular Surgery | Admitting: Vascular Surgery

## 2014-06-15 ENCOUNTER — Ambulatory Visit (INDEPENDENT_AMBULATORY_CARE_PROVIDER_SITE_OTHER): Payer: Medicare Other | Admitting: Vascular Surgery

## 2014-06-15 ENCOUNTER — Ambulatory Visit
Admission: RE | Admit: 2014-06-15 | Discharge: 2014-06-15 | Disposition: A | Discharge status: Home / Self Care | Payer: Medicare Other | Source: Ambulatory Visit | Attending: Vascular Surgery | Admitting: Vascular Surgery

## 2014-06-15 ENCOUNTER — Ambulatory Visit (HOSPITAL_COMMUNITY)
Admission: RE | Admit: 2014-06-15 | Discharge: 2014-06-15 | Disposition: A | Discharge status: Home / Self Care | Payer: Medicare Other | Source: Ambulatory Visit | Attending: Vascular Surgery | Admitting: Vascular Surgery

## 2014-06-15 ENCOUNTER — Encounter: Payer: Self-pay | Admitting: Vascular Surgery

## 2014-06-15 VITALS — BP 137/83 | HR 83 | Ht 69.0 in | Wt 201.6 lb

## 2014-06-15 DIAGNOSIS — I714 Abdominal aortic aneurysm, without rupture, unspecified: Secondary | ICD-10-CM

## 2014-06-15 DIAGNOSIS — Z48812 Encounter for surgical aftercare following surgery on the circulatory system: Secondary | ICD-10-CM | POA: Insufficient documentation

## 2014-06-15 DIAGNOSIS — I70208 Unspecified atherosclerosis of native arteries of extremities, other extremity: Secondary | ICD-10-CM

## 2014-06-15 DIAGNOSIS — I70209 Unspecified atherosclerosis of native arteries of extremities, unspecified extremity: Secondary | ICD-10-CM

## 2014-06-15 IMAGING — CT CT CTA ABD/PEL W/CM AND/OR W/O CM
3 of 10 series · 10 of 36 positions shown, 15 images · IV contrast (omnipaque)
Comparison: [DATE] and [DATE].

CLINICAL DATA: Status NOMASIBULELE to repair abdominal aortic aneurysm
on [DATE].

EXAM:
CTA ABDOMEN AND PELVIS WITHOUT CONTRAST
TECHNIQUE: Multidetector CT imaging of the abdomen and pelvis was performed
using the standard protocol during bolus administration of
intravenous contrast. Multiplanar reconstructed images and MIPs were
obtained and reviewed to evaluate the vascular anatomy.
CONTRAST:  75mL OMNIPAQUE IOHEXOL 350 MG/ML SOLN

[Series 5: angio 2.5 · axial · 0.82mm/px · z∈[-381,-101]mm · 4 of 188 slices shown, 9 images]
[im 38/188  soft-tissue]
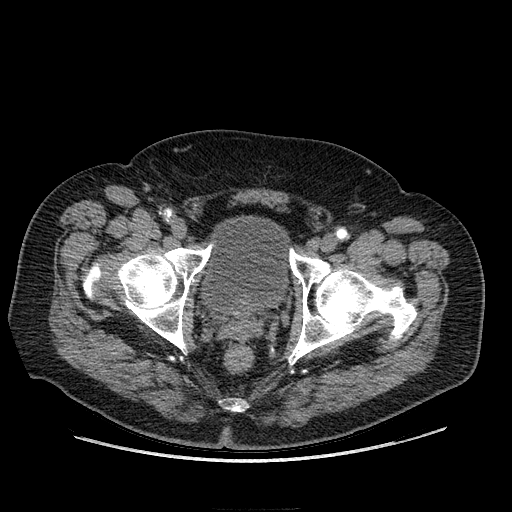
[im 38/188  lung]
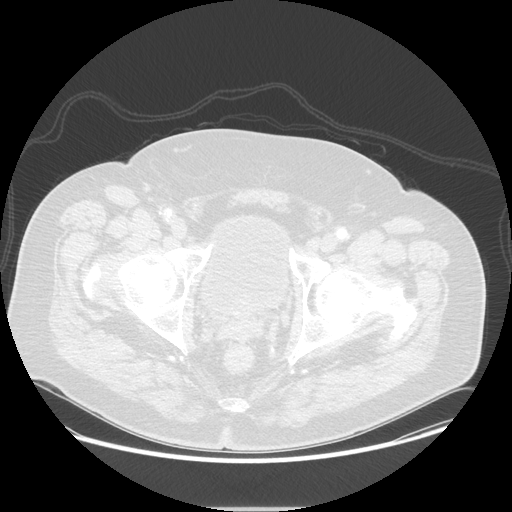
[im 38/188  bone]
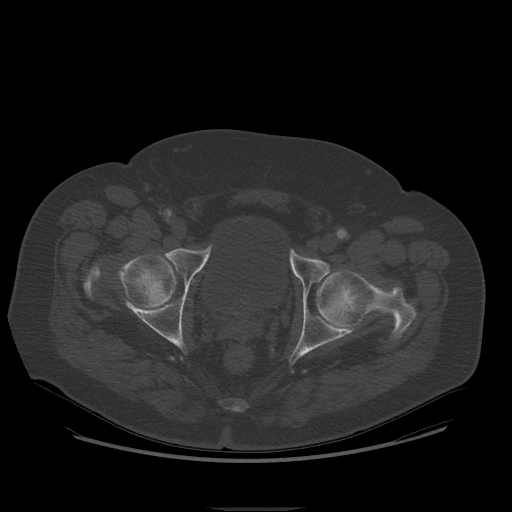
[im 75/188  soft-tissue]
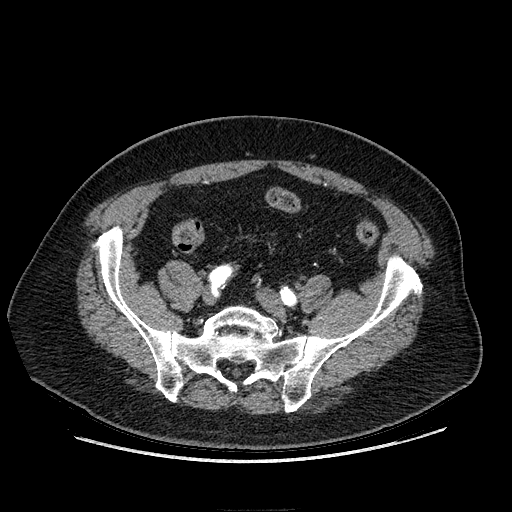
[im 75/188  lung]
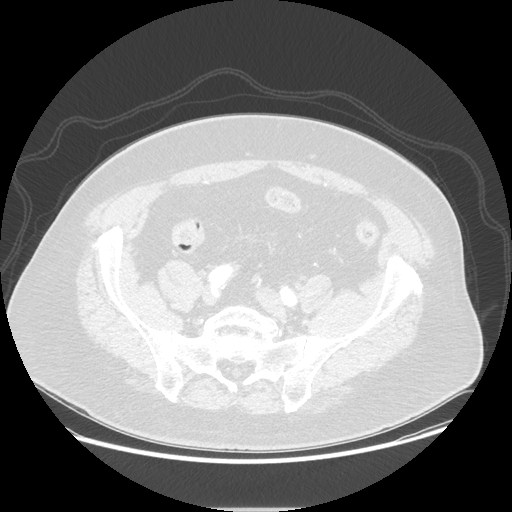
[im 113/188  soft-tissue]
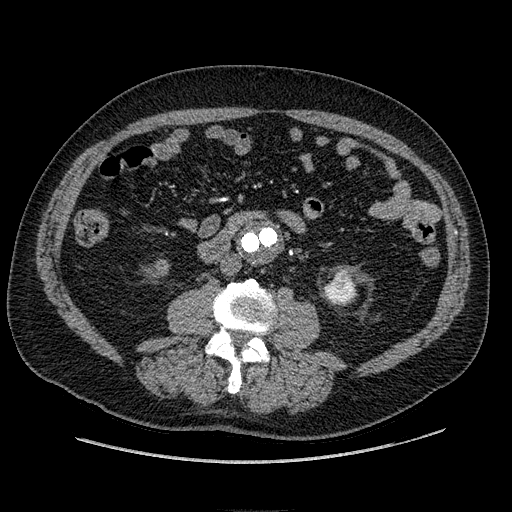
[im 113/188  lung]
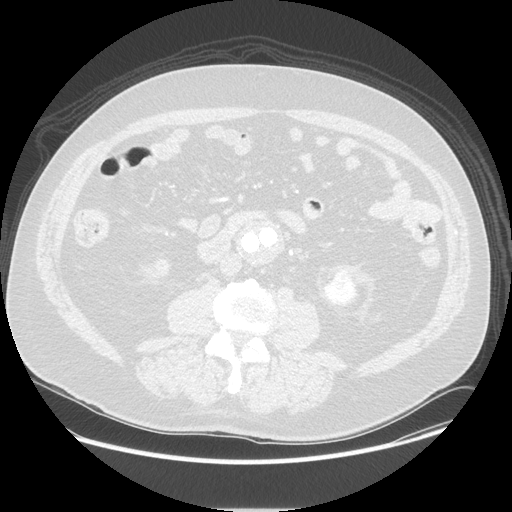
[im 150/188  soft-tissue]
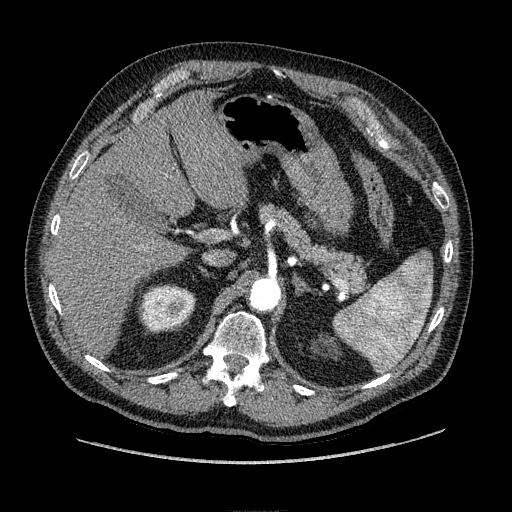
[im 150/188  lung]
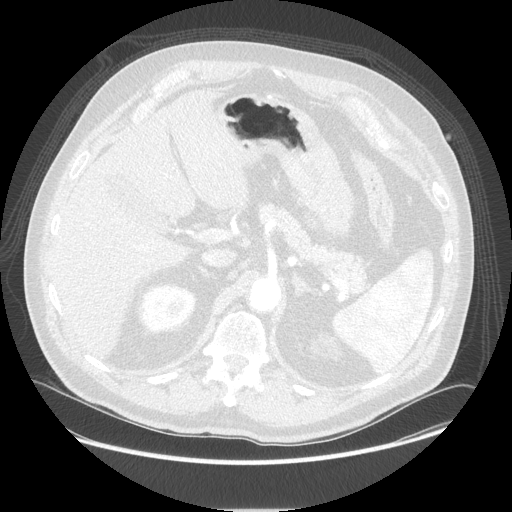

[Series 602: sagittal body · sagittal · 0.91mm/px · 3 of 161 slices shown (1 of 2)]
[im 41/161  soft-tissue]
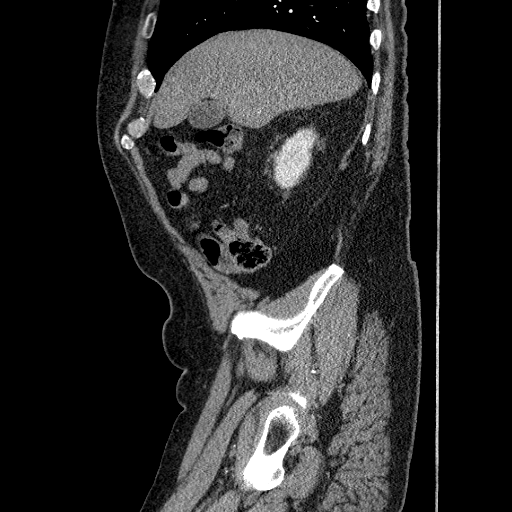
[im 81/161  soft-tissue]
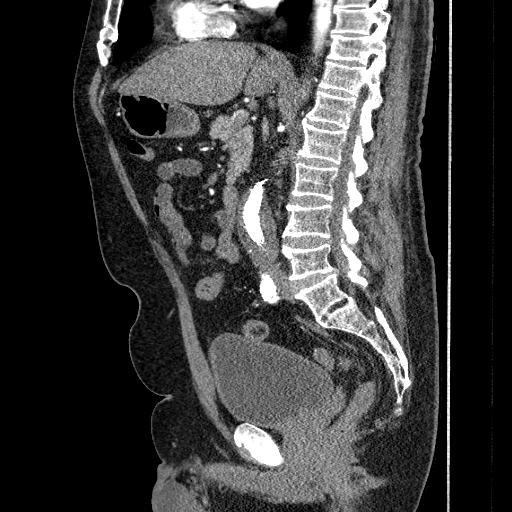
[im 121/161  soft-tissue]
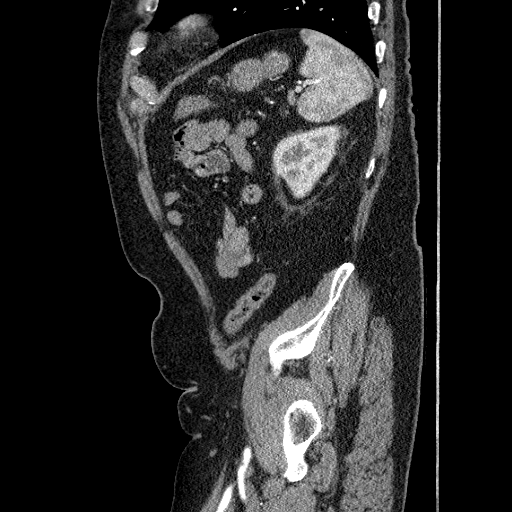

[Series 607: sagittal body · sagittal · 0.82mm/px · 3 of 161 slices shown (2 of 2)]
[im 41/161  soft-tissue]
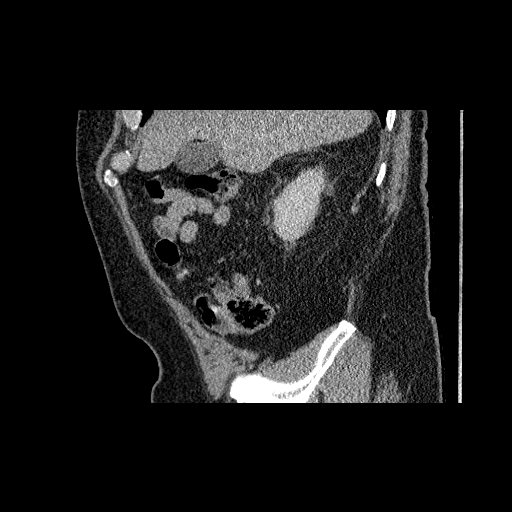
[im 81/161  soft-tissue]
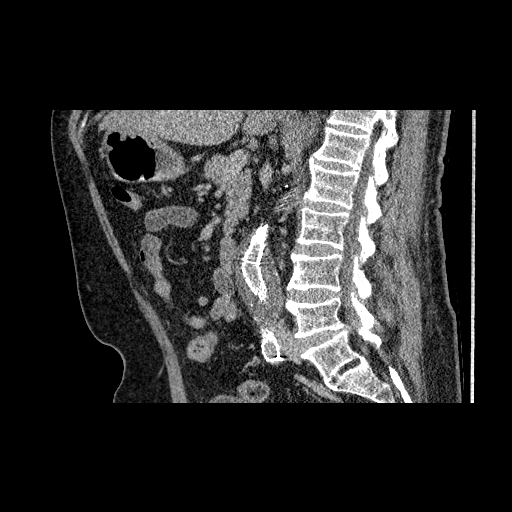
[im 121/161  soft-tissue]
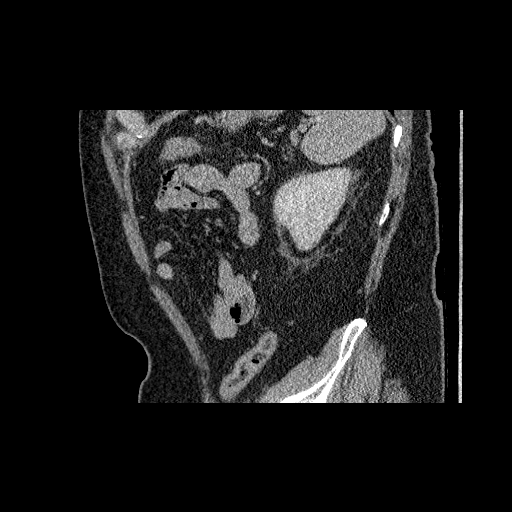

[10 of 36 positions shown; findings below may reference images not displayed]

FINDINGS: The aortic endograft shows stable positioning and normal patency
without evidence of migration. Surrounding aneurysm sac shows
complete thrombosis and further diminishment in size with estimated
maximal transverse dimensions of 4.0 x 4.6 cm currently (4.8 x
cm previously). There is no evidence of endoleak. Proximal and
distal graft apposition is normal and normal patency is again noted
of bilateral iliac arteries. Stable atherosclerosis of the abdominal
aorta superior to the endograft with stable noncalcified plaque
along the posterior wall of the immediate suprarenal segment just
above the endograft.

Stable focal stenosis of the right common femoral artery. The celiac
axis, superior mesenteric artery and bilateral single renal arteries
remain normally patent. No retroperitoneal fluid collections.

Solid organs have a stable appearance. No abnormalities are
identified involving bowel. No incidental masses or enlarged lymph
nodes are identified. Stable small left inguinal hernia containing
fat. The bladder is unremarkable. No bony abnormalities.

Review of the MIP images confirms the above findings.
IMPRESSION: Normal patency and stable positioning of endograft. Further
reduction in size of surrounding thrombosed aneurysm sac which now
measures 4.0 x 4.6 cm. No evidence of endoleak.

## 2014-06-15 MED ORDER — IOHEXOL 350 MG/ML SOLN
75.0000 mL | Freq: Once | INTRAVENOUS | Status: AC | PRN
  Administered 2014-06-15: 75 mL via INTRAVENOUS

## 2014-06-15 NOTE — Addendum Note (Signed)
Addended by: Dorthula Rue L on: 06/15/2014 02:44 PM   Modules accepted: Orders

## 2014-06-15 NOTE — Progress Notes (Signed)
Subjective:     Patient ID: [name] [name], male   DOB: 02-10-1946, 68 y.o.   MRN: [id number]  HPI this 68 year old male returns for continued follow-up regarding his aortic stent graft placed one year ago. He has had no new symptoms. He denies any claudication in either lower extremity. He is able to ambulate several blocks. He's had no abdominal or back pain.  Past Medical History  Diagnosis Date  . AAA (abdominal aortic aneurysm)   . Hypertension   . Hyperlipidemia   . Reflux   . Arthritis     Gout  . Back pain   . GERD (gastroesophageal reflux disease)   . Diabetes mellitus without complication     History  Substance Use Topics  . Smoking status: Former Smoker -- 1.00 packs/day for 20 years    Types: Cigarettes    Quit date: 07/03/1983  . Smokeless tobacco: Never Used  . Alcohol Use: 7.2 oz/week    12 Cans of beer per week    Family History  Problem Relation Age of Onset  . Diabetes Father   . Stroke Father   . Heart attack Father   . Heart attack Brother     X's 2  . Coronary artery disease Mother   . Diabetes Mother   . Heart attack Mother   . Diabetes Sister     No Known Allergies  Current outpatient prescriptions: allopurinol (ZYLOPRIM) 100 MG tablet, Take 200 mg by mouth daily. , Disp: , Rfl: ;  furosemide (LASIX) 40 MG tablet, Take 40 mg by mouth daily., Disp: , Rfl: ;  glimepiride (AMARYL) 1 MG tablet, Take 1 mg by mouth daily with breakfast. , Disp: , Rfl: ;  HYDROcodone-acetaminophen (NORCO/VICODIN) 5-325 MG per tablet, Take 1 tablet by mouth every 4 (four) hours as needed for pain (for gout)., Disp: 20 tablet, Rfl: 0 lisinopril (PRINIVIL,ZESTRIL) 10 MG tablet, Take 10 mg by mouth daily. , Disp: , Rfl: ;  metoprolol succinate (TOPROL-XL) 50 MG 24 hr tablet, Take 50 mg by mouth daily. Take with or immediately following a meal., Disp: , Rfl: ;  omeprazole (PRILOSEC) 20 MG capsule, Take 20 mg by mouth daily., Disp: , Rfl: ;  rosuvastatin (CRESTOR) 20 MG tablet,  Take 10 mg by mouth daily. , Disp: , Rfl:   BP 137/83 mmHg  Pulse 83  Ht 5\' 9"  (1.753 m)  Wt 201 lb 9.6 oz (91.445 kg)  BMI 29.76 kg/m2  SpO2 96%  Body mass index is 29.76 kg/(m^2).         Review of Systems denies   chest pain, dyspnea on exertion, PND, orthopnea. Patient does have occasional reflux esophagitis. All other systems negative and a complete review of systems Objective:   Physical Exam BP 137/83 mmHg  Pulse 83  Ht 5\' 9"  (1.753 m)  Wt 201 lb 9.6 oz (91.445 kg)  BMI 29.76 kg/m2  SpO2 96%  Gen.-alert and oriented x3 in no apparent distress HEENT normal for age Lungs no rhonchi or wheezing Cardiovascular regular rhythm no murmurs carotid pulses 3+ palpable no bruits audible Abdomen soft nontender no palpable masses Musculoskeletal free of  major deformities Skin clear -no rashes Neurologic normal Lower extremities 3+ femoral pulse on the left and 2+ on the right. and dorsalis pedis pulses palpable bilaterally with no edema  Today I ordered bilateral ABIs and duplex scan of the right femoral artery. There does appear to be a 50% or greater stenosis in the right common femoral artery  consistent with CT angiogram. ABIs however are normal with triphasic flow in the feet.  I also ordered a CT angiogram of the abdomen and pelvis which I reviewed by computer. The aneurysm sac continues to decrease in size to a proximally 4.9 cm. No endoleak is noted today having seen a small type II endoleak in the past. The stenosis in the right common femoral artery appears to be atherosclerotic in nature and is stable.       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

## 2014-06-22 ENCOUNTER — Ambulatory Visit: Payer: Medicare Other | Admitting: Thoracic Surgery (Cardiothoracic Vascular Surgery)

## 2014-06-29 ENCOUNTER — Other Ambulatory Visit: Payer: Self-pay | Admitting: *Deleted

## 2014-06-29 DIAGNOSIS — R911 Solitary pulmonary nodule: Secondary | ICD-10-CM

## 2014-07-06 ENCOUNTER — Encounter: Payer: Self-pay | Admitting: Thoracic Surgery (Cardiothoracic Vascular Surgery)

## 2014-07-06 ENCOUNTER — Ambulatory Visit (INDEPENDENT_AMBULATORY_CARE_PROVIDER_SITE_OTHER): Payer: Medicare Other | Admitting: Thoracic Surgery (Cardiothoracic Vascular Surgery)

## 2014-07-06 ENCOUNTER — Ambulatory Visit
Admission: RE | Admit: 2014-07-06 | Discharge: 2014-07-06 | Disposition: A | Discharge status: Home / Self Care | Payer: Medicare Other | Source: Ambulatory Visit | Attending: Thoracic Surgery (Cardiothoracic Vascular Surgery) | Admitting: Thoracic Surgery (Cardiothoracic Vascular Surgery)

## 2014-07-06 VITALS — BP 137/76 | HR 85 | Resp 16 | Ht 69.0 in | Wt 200.0 lb

## 2014-07-06 DIAGNOSIS — R911 Solitary pulmonary nodule: Secondary | ICD-10-CM

## 2014-07-06 DIAGNOSIS — D381 Neoplasm of uncertain behavior of trachea, bronchus and lung: Secondary | ICD-10-CM

## 2014-07-06 MED ORDER — IOHEXOL 300 MG/ML  SOLN
75.0000 mL | Freq: Once | INTRAMUSCULAR | Status: AC | PRN
  Administered 2014-07-06: 75 mL via INTRAVENOUS

## 2014-07-06 NOTE — Progress Notes (Signed)
HPI:  [name] returns for a scheduled follow-up visit regarding his right lower lobe nodule.  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 He had a CT of the abdomen and pelvis for followup after a stent graft for an abdominal aortic aneurysm in June of 2015. On that scan he was noted to have a 13 x 8 mm bilobed nodule in the right lower lobe. A retrospective review of his previous CT scans showed this lesion had been present since 2008 when it was 8 mm. The nodule showed no definite growth in the interval between October of 2014 and June of 2015.  In the interim since his last visit his been doing well. He has no complaints. He denies cough, hemoptysis, shortness of breath, wheezing and weight loss.  Past Medical History  Diagnosis Date  . AAA (abdominal aortic aneurysm)   . Hypertension   . Hyperlipidemia   . Reflux   . Arthritis     Gout  . Back pain   . GERD (gastroesophageal reflux disease)   . Diabetes mellitus without complication    lung nodule    Current Outpatient Prescriptions  Medication Sig Dispense Refill  . allopurinol (ZYLOPRIM) 100 MG tablet Take 200 mg by mouth daily.     . furosemide (LASIX) 40 MG tablet Take 40 mg by mouth daily.    Marland Kitchen glimepiride (AMARYL) 1 MG tablet Take 1 mg by mouth daily with breakfast.     . HYDROcodone-acetaminophen (NORCO/VICODIN) 5-325 MG per tablet Take 1 tablet by mouth every 4 (four) hours as needed for pain (for gout). 20 tablet 0  . lisinopril (PRINIVIL,ZESTRIL) 10 MG tablet Take 10 mg by mouth daily.     . metoprolol succinate (TOPROL-XL) 50 MG 24 hr tablet Take 50 mg by mouth daily. Take with or immediately following a meal.    . omeprazole (PRILOSEC) 20 MG capsule Take 20 mg by mouth daily.    . rosuvastatin (CRESTOR) 20 MG tablet Take 10 mg by mouth daily.     . tamsulosin (FLOMAX) 0.4 MG CAPS capsule Take 0.4 mg by mouth daily.     No current facility-administered  medications for this visit.    Physical Exam BP 137/76 mmHg  Pulse 85  Resp 16  Ht 5\' 9"  (1.753 m)  Wt 200 lb (90.719 kg)  BMI 29.52 kg/m2  SpO76 53% 69 year old man in no acute distress Well-developed well-nourished Alert and oriented 3 with no focal neurologic deficits Lungs clear with equal breath sounds bilaterally No cervical or subclavicular adenopathy Cardiac regular rate and rhythm normal S1 and S2  Diagnostic Tests: CT CHEST WITH CONTRAST  TECHNIQUE: Multidetector CT imaging of the chest was performed during intravenous contrast administration.  CONTRAST: 58mL OMNIPAQUE IOHEXOL 300 MG/ML SOLN  COMPARISON: 12/10/2013  FINDINGS: 14 x 8 mm right lower lobe pulmonary nodule (series 4/image 42), previously 13 x 8 mm, grossly unchanged from the most recent CT although progressed since 2008.  Mild centrilobular and paraseptal emphysematous changes, upper lobe predominant. No pleural effusion or pneumothorax.  Visualized thyroid is unremarkable.  The heart is normal in size. No pericardial effusion. Coronary atherosclerosis. Mild atherosclerotic calcifications of the aortic arch.  No suspicious mediastinal, hilar, or axillary lymphadenopathy.  Visualized upper abdomen is notable for an aortic stent, incompletely visualized.  IMPRESSION: 14 x 8 mm right lower lobe pulmonary nodule, grossly unchanged from the most recent CT, although progressed since 2008.  Consider PET-CT for further  evaluation. Alternatively, follow-up CT chest could be performed in 6 months, although this lesion has already demonstrated (slow) growth.   Electronically Signed  By: Julian Hy M.D.  On: 07/06/2014 13:46   Impression: 69 year old man with a right lower lobe lung nodule. This nodule is unchanged over the past 6 months and really unchanged going back to October 2014. It is larger than when it first appeared on a CT in 2008. It was measured at 8 mm  that time however with careful measurement appears to be closer to 11 mm. In any event there is been very slow growth of the nodule over a period of 8 years. There has been no recent growth of the nodule. This is consistent with a benign etiology. However given that there has been growth I would agree that it does need to continue to be followed with CT scan.  I recommended to him that we repeat a CT in 6 months. I will see him back at that time

## 2014-12-24 ENCOUNTER — Other Ambulatory Visit: Payer: Self-pay | Admitting: *Deleted

## 2014-12-24 DIAGNOSIS — R911 Solitary pulmonary nodule: Secondary | ICD-10-CM

## 2015-01-11 ENCOUNTER — Ambulatory Visit
Admission: RE | Admit: 2015-01-11 | Discharge: 2015-01-11 | Disposition: A | Discharge status: Home / Self Care | Payer: Medicare Other | Source: Ambulatory Visit | Attending: Thoracic Surgery (Cardiothoracic Vascular Surgery) | Admitting: Thoracic Surgery (Cardiothoracic Vascular Surgery)

## 2015-01-11 ENCOUNTER — Ambulatory Visit (INDEPENDENT_AMBULATORY_CARE_PROVIDER_SITE_OTHER): Payer: Medicare Other | Admitting: Thoracic Surgery (Cardiothoracic Vascular Surgery)

## 2015-01-11 VITALS — BP 114/70 | HR 76 | Resp 16 | Ht 69.0 in | Wt 196.0 lb

## 2015-01-11 DIAGNOSIS — R911 Solitary pulmonary nodule: Secondary | ICD-10-CM

## 2015-01-11 DIAGNOSIS — D381 Neoplasm of uncertain behavior of trachea, bronchus and lung: Secondary | ICD-10-CM | POA: Diagnosis not present

## 2015-01-11 IMAGING — CT CT CHEST W/O CM
3 of 4 series · 16 of 30 positions shown, 17 images · non-contrast
Comparison: [DATE]

CLINICAL DATA: Productive cough for 6 months.  Lung nodules.

EXAM:
CT CHEST WITHOUT CONTRAST
TECHNIQUE: Multidetector CT imaging of the chest was performed following the
standard protocol without IV contrast.

[Series 3: chest w/o · axial · non-contrast · 0.74mm/px · z∈[-205,-25]mm · 4 of 61 slices shown, 5 images]
[im 13/61  mediastinal]
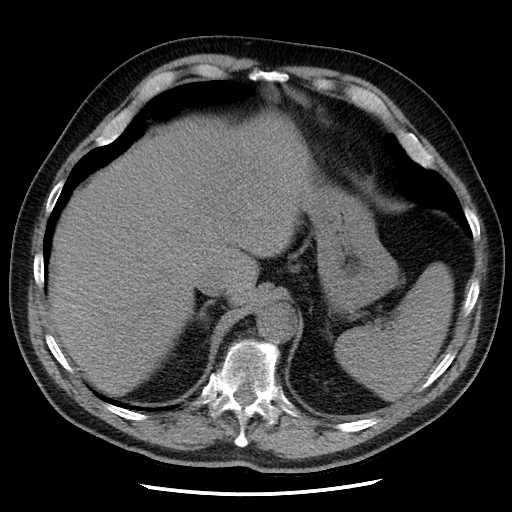
[im 13/61  lung]
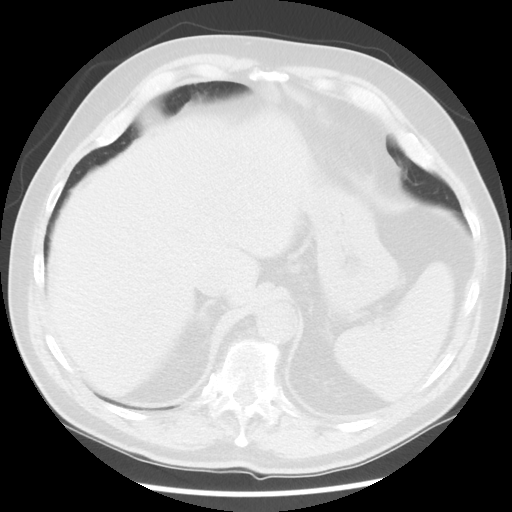
[im 25/61  lung]
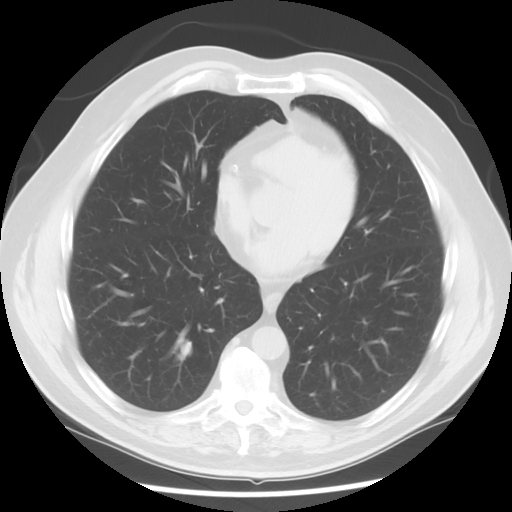
[im 37/61  lung]
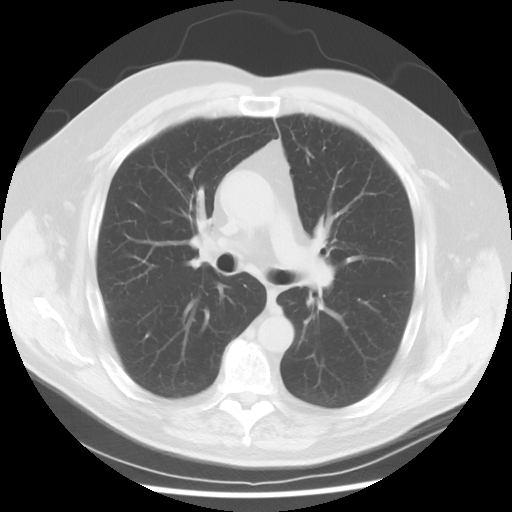
[im 49/61  lung]
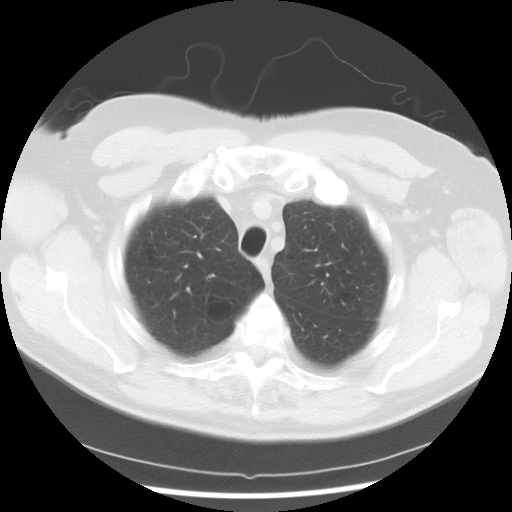

[Series 4: lung windows · axial · 0.74mm/px · z∈[-205,-25]mm · 4 of 61 slices shown]
[im 13/61  lung]
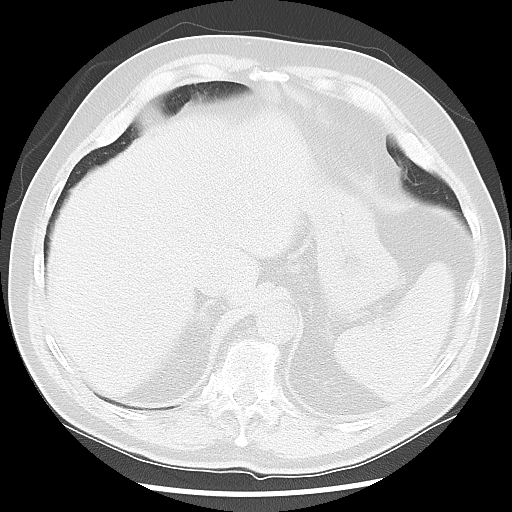
[im 25/61  lung]
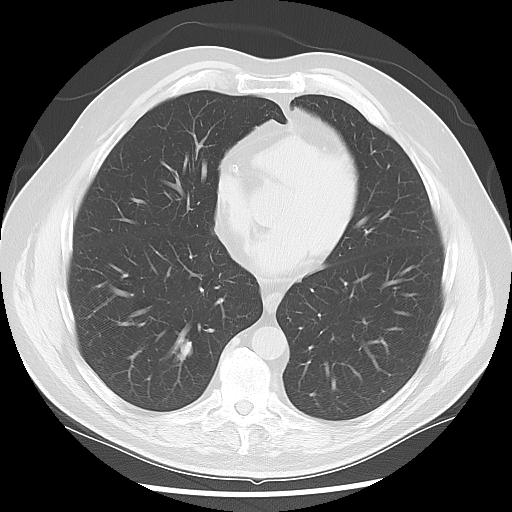
[im 37/61  lung]
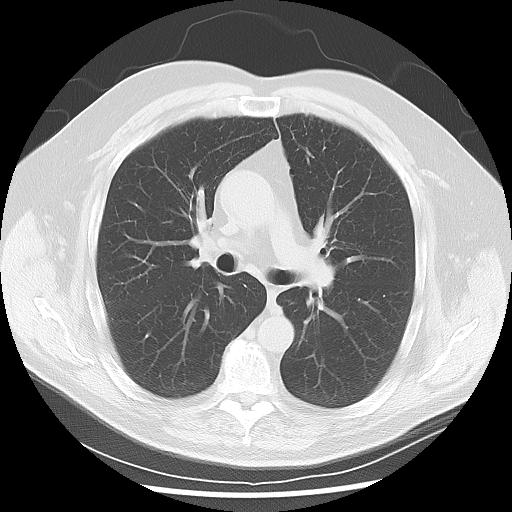
[im 49/61  lung]
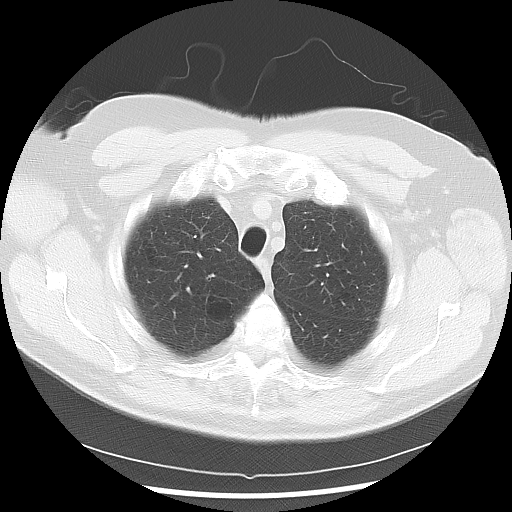

[Series 602: sagittal body · sagittal · 0.74mm/px · 8 of 153 slices shown]
[im 11/153  mediastinal]
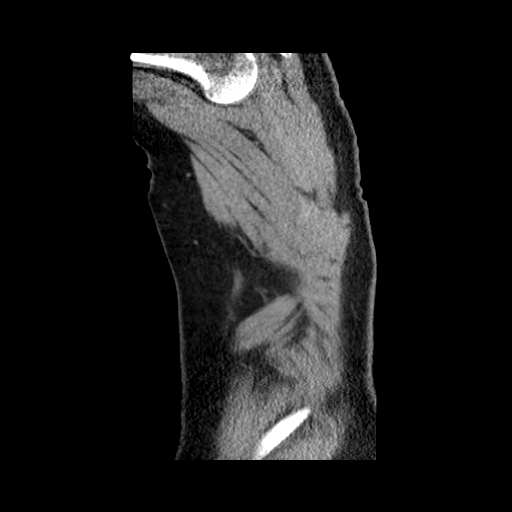
[im 31/153  mediastinal]
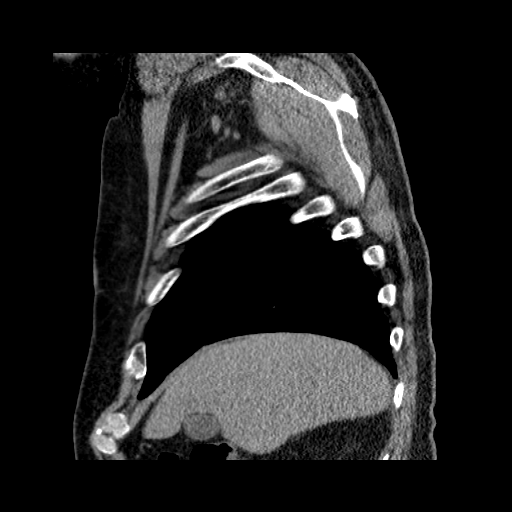
[im 51/153  mediastinal]
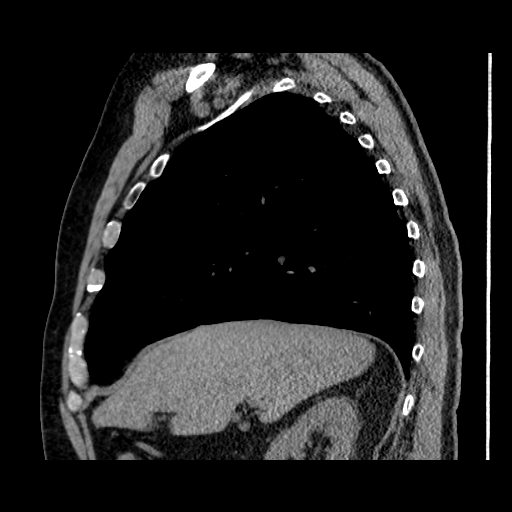
[im 71/153  mediastinal]
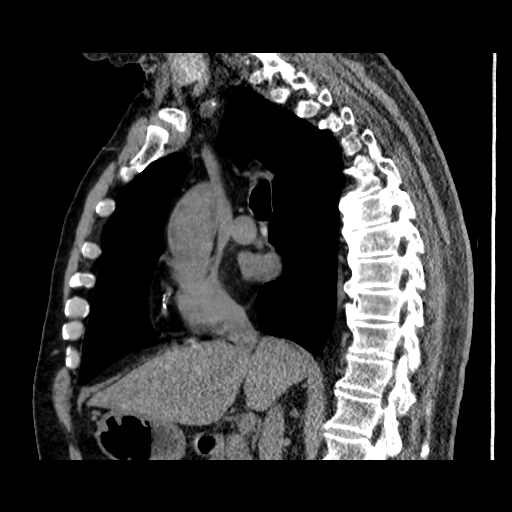
[im 82/153  mediastinal]
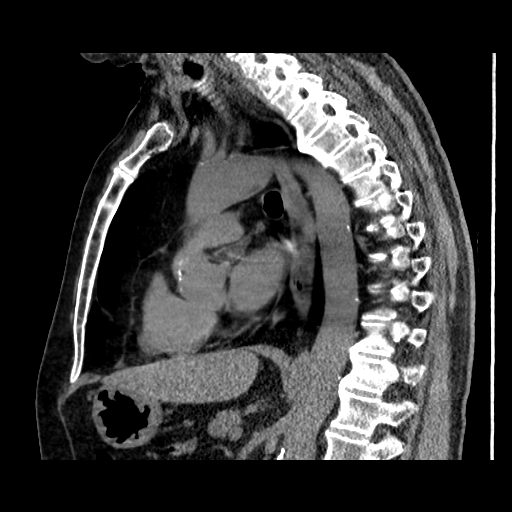
[im 102/153  mediastinal]
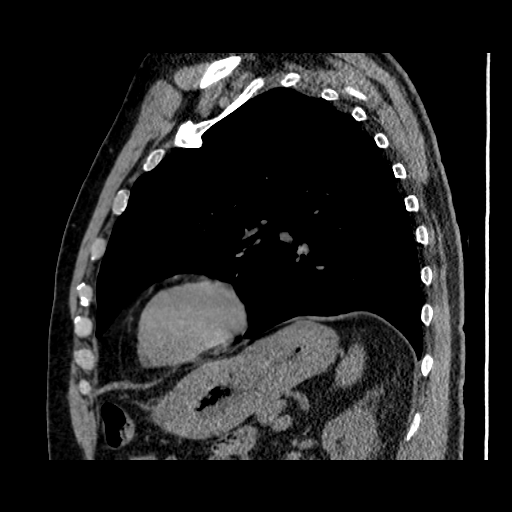
[im 122/153  mediastinal]
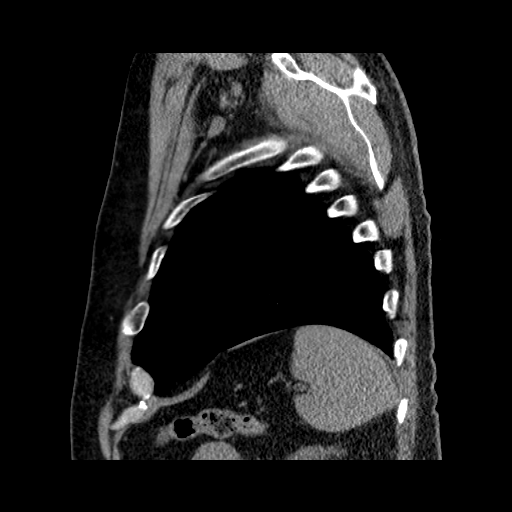
[im 142/153  mediastinal]
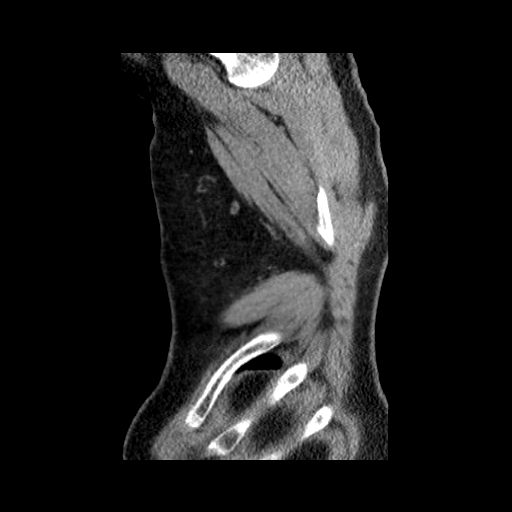

[16 of 30 positions shown; findings below may reference images not displayed]

FINDINGS: Mediastinum/Nodes: Coronary, aortic arch, and branch vessel
atherosclerotic vascular disease.

Distal paraesophageal lymph node 1 cm in diameter, borderline
prominent, image 40 series 3, but stable from [DATE].

Lungs/Pleura: Centrilobular emphysema.

The right lower lobe pulmonary nodule measurements are as follows:

[DATE]:  1.5 by 0.9 cm

[DATE]:  1.4 by 0.9 cm

[DATE]:  1.4 by 0.9 cm

[DATE]:  1.5 by 0.9 cm

[DATE]:  1.1 by 0.9 cm

This nodule is tangential to both pulmonary arterial and pulmonary
venous structures.

Upper abdomen: Calcification along the dome of the right hepatic
lobe, similar to prior.

Musculoskeletal: Thoracic spondylosis.
IMPRESSION: 1. The right lower lobe pulmonary nodule has enlarged somewhat
compared to [XP] but not since [XP]. This has sharply defined
rounded borders. A could be a vascular malformation ; I am unable to
tell from prior enhancement whether this is the case or not. If this
is a neoplastic process it appears to be very indolent. In the
absence of other clinical factors, observation is probably the most
appropriate management.

## 2015-01-11 NOTE — Progress Notes (Signed)
[address]       [place] [address]             [phone]       HPI:  Mr. Bochenek returns for a scheduled 6 month follow-up visit.  He is a 69 year old gentleman who is being followed for a right lower lobe nodule. He had a CT of the abdomen and pelvis for followup after a stent graft for an abdominal aortic aneurysm in June of 2015. On that scan he was noted to have a 13 x 8 mm bilobed nodule in the right lower lobe. A retrospective review of his previous CT scans showed this lesion had been present since 2008 when it was 8 mm. The nodule showed no definite growth in the interval between October of 2014 and June of 2015. I first saw him a year ago. Given the slow overall growth and recent stability recommended continued radiographic follow-up.  He was last seen in the office in January. He was doing well at that time in the nodule is unchanged.  In the interim since his last visit he has been doing well. He has had a cough for the past couple of months. He said this is productive of clear to white sputum. He denies any fevers, chills, or sweats. He has not had any hemoptysis. He also denies shortness of breath, wheezing and weight loss. His exercise tolerance is good. He remains very active. Zubrod = 0.  Past Medical History  Diagnosis Date  . AAA (abdominal aortic aneurysm)   . Hypertension   . Hyperlipidemia   . Reflux   . Arthritis     Gout  . Back pain   . GERD (gastroesophageal reflux disease)   . Diabetes mellitus without complication   . Lung nodule, solitary 2008    RLL, 8 mm in 2008, 14 mm in 2014   Past Surgical History  Procedure Laterality Date  . Vasectomy    . Colonoscopy  2010  . Abdominal aortic endovascular stent graft N/A 04/29/2013    Procedure: ABDOMINAL AORTIC ENDOVASCULAR STENT GRAFT -GORE;  Surgeon: Mal Misty, MD;  Location: [facility];  Service: Vascular;  Laterality: N/A;    Current Outpatient Prescriptions  Medication  Sig Dispense Refill  . allopurinol (ZYLOPRIM) 100 MG tablet Take 200 mg by mouth daily.     . furosemide (LASIX) 40 MG tablet Take 40 mg by mouth daily.    Marland Kitchen glimepiride (AMARYL) 1 MG tablet Take 1 mg by mouth daily with breakfast.     . HYDROcodone-acetaminophen (NORCO/VICODIN) 5-325 MG per tablet Take 1 tablet by mouth every 4 (four) hours as needed for pain (for gout). 20 tablet 0  . lisinopril (PRINIVIL,ZESTRIL) 10 MG tablet Take 10 mg by mouth daily.     . metoprolol succinate (TOPROL-XL) 50 MG 24 hr tablet Take 50 mg by mouth daily. Take with or immediately following a meal.    . omeprazole (PRILOSEC) 20 MG capsule Take 20 mg by mouth daily.    . rosuvastatin (CRESTOR) 20 MG tablet Take 10 mg by mouth daily.     . tamsulosin (FLOMAX) 0.4 MG CAPS capsule Take 0.4 mg by mouth daily.     No current facility-administered medications for this visit.    Physical Exam BP 114/70 mmHg  Pulse 76  Resp 16  Ht 5\' 9"  (1.753 m)  Wt 196 lb (88.905 kg)  BMI 28.54 kg/m41 69 year old man in no acute  distress Well-developed and well-nourished Alert and oriented 3 with no focal neurologic deficit No cervical or supraclavicular adenopathy Cardiac regular rate and rhythm normal S1 and S2 Lungs clear with equal breath sounds bilaterally  Diagnostic Tests: CT CHEST WITHOUT CONTRAST  TECHNIQUE: Multidetector CT imaging of the chest was performed following the standard protocol without IV contrast.  COMPARISON: 07/06/2014  FINDINGS: Mediastinum/Nodes: Coronary, aortic arch, and branch vessel atherosclerotic vascular disease.  Distal paraesophageal lymph node 1 cm in diameter, borderline prominent, image 40 series 3, but stable from 07/06/2014.  Lungs/Pleura: Centrilobular emphysema.  The right lower lobe pulmonary nodule measurements are as follows:  01/11/2015: 1.5 by 0.9 cm  07/06/2014: 1.4 by 0.9 cm  12/10/2013: 1.4 by 0.9 cm  04/21/2013: 1.5 by 0.9  cm  07/02/2007: 1.1 by 0.9 cm  This nodule is tangential to both pulmonary arterial and pulmonary venous structures.  Upper abdomen: Calcification along the dome of the right hepatic lobe, similar to prior.  Musculoskeletal: Thoracic spondylosis.  IMPRESSION: 1. The right lower lobe pulmonary nodule has enlarged somewhat compared to 2008 but not since 2014. This has sharply defined rounded borders. A could be a vascular malformation ; I am unable to tell from prior enhancement whether this is the case or not. If this is a neoplastic process it appears to be very indolent. In the absence of other clinical factors, observation is probably the most appropriate management.   Electronically Signed  By: Van Clines M.D.  On: 01/11/2015 13:21  I personally reviewed the CT chest and concur with the findings as noted above  Impression: 69 year old man with a right lower lobe nodule that is been stable over the past 2 years. This nodule was present on a CT of the abdomen back in 2008 and did grow between now 2008 and 2014. 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 Given the stability over the past 2 years there is no indication for surgery. I did recommend a follow-up CT scan in a year to ensure continued stability. He asked that that be done at the same time as his CT scanning for his aneurysm. That should be easy enough to do.  Plan: Return in one year with CT of chest  I spent 10 minutes with [name] [name] during this visit. Melrose Nakayama, MD [facility] [phone]

## 2015-06-15 ENCOUNTER — Encounter: Payer: Self-pay | Admitting: Vascular Surgery

## 2015-06-20 ENCOUNTER — Other Ambulatory Visit: Payer: Self-pay | Admitting: *Deleted

## 2015-06-20 DIAGNOSIS — Z01812 Encounter for preprocedural laboratory examination: Secondary | ICD-10-CM

## 2015-06-21 ENCOUNTER — Ambulatory Visit (INDEPENDENT_AMBULATORY_CARE_PROVIDER_SITE_OTHER)
Admission: RE | Admit: 2015-06-21 | Discharge: 2015-06-21 | Disposition: A | Discharge status: Home / Self Care | Payer: Medicare Other | Source: Ambulatory Visit | Attending: Vascular Surgery | Admitting: Vascular Surgery

## 2015-06-21 ENCOUNTER — Ambulatory Visit (INDEPENDENT_AMBULATORY_CARE_PROVIDER_SITE_OTHER): Payer: Medicare Other | Admitting: Vascular Surgery

## 2015-06-21 ENCOUNTER — Other Ambulatory Visit: Payer: Self-pay | Admitting: Vascular Surgery

## 2015-06-21 ENCOUNTER — Encounter: Payer: Self-pay | Admitting: Vascular Surgery

## 2015-06-21 ENCOUNTER — Ambulatory Visit
Admission: RE | Admit: 2015-06-21 | Discharge: 2015-06-21 | Disposition: A | Discharge status: Home / Self Care | Payer: Medicare Other | Source: Ambulatory Visit | Attending: Vascular Surgery | Admitting: Vascular Surgery

## 2015-06-21 ENCOUNTER — Ambulatory Visit (HOSPITAL_COMMUNITY)
Admission: RE | Admit: 2015-06-21 | Discharge: 2015-06-21 | Disposition: A | Discharge status: Home / Self Care | Payer: Medicare Other | Source: Ambulatory Visit | Attending: Vascular Surgery | Admitting: Vascular Surgery

## 2015-06-21 VITALS — BP 136/81 | HR 60 | Temp 97.3°F | Resp 18 | Ht 69.0 in | Wt 198.0 lb

## 2015-06-21 DIAGNOSIS — Z48812 Encounter for surgical aftercare following surgery on the circulatory system: Secondary | ICD-10-CM | POA: Diagnosis not present

## 2015-06-21 DIAGNOSIS — I714 Abdominal aortic aneurysm, without rupture, unspecified: Secondary | ICD-10-CM

## 2015-06-21 DIAGNOSIS — I739 Peripheral vascular disease, unspecified: Secondary | ICD-10-CM

## 2015-06-21 IMAGING — CT CT CTA ABD/PEL W/CM AND/OR W/O CM
3 of 10 series · 10 of 36 positions shown, 15 images · IV contrast (75CC ISOVUE 370)
Comparison: COMPARISON
[DATE] and previous

CLINICAL DATA: Stent placement [DATE]

EXAM:
CT ANGIOGRAPHY ABDOMEN AND PELVIS
TECHNIQUE: Multidetector CT imaging of the abdomen and pelvis was performed
using the standard protocol during bolus administration of
intravenous contrast. Multiplanar reconstructed images including
MIPs were obtained and reviewed to evaluate the vascular anatomy.
CONTRAST:  75 mL Isovue 370 IV

[Series 5: angio 2.5 · axial · 0.80mm/px · z∈[-341,-56]mm · 4 of 190 slices shown, 9 images]
[im 38/190  soft-tissue]
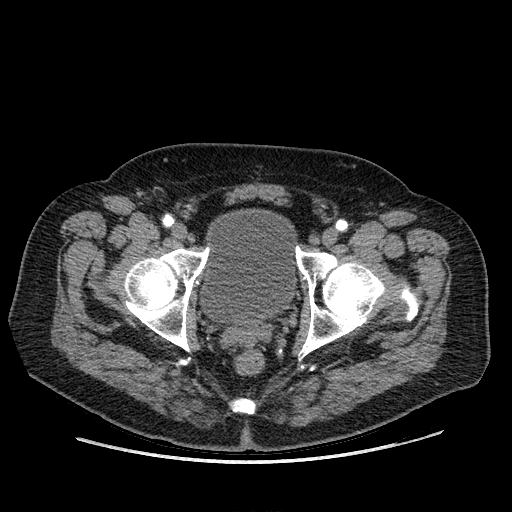
[im 38/190  lung]
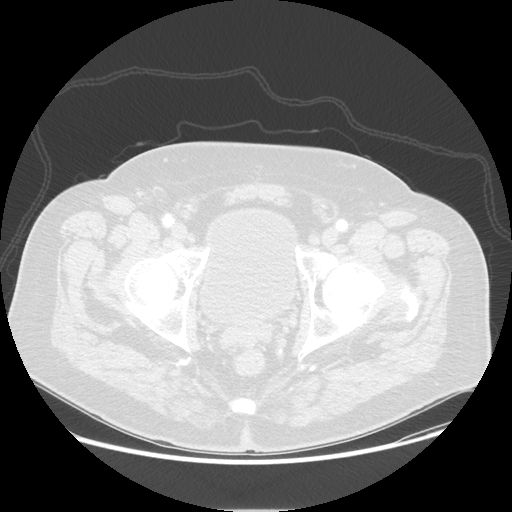
[im 38/190  bone]
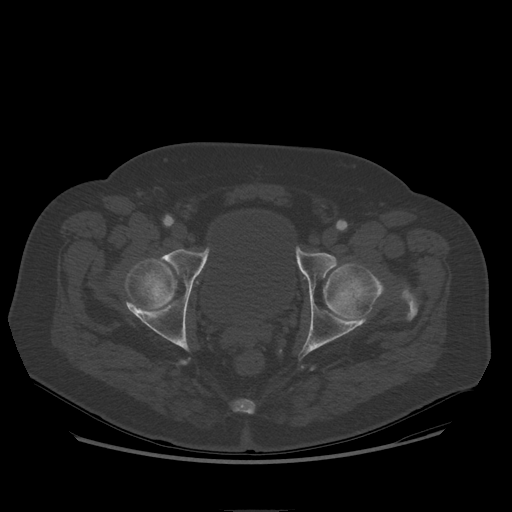
[im 76/190  soft-tissue]
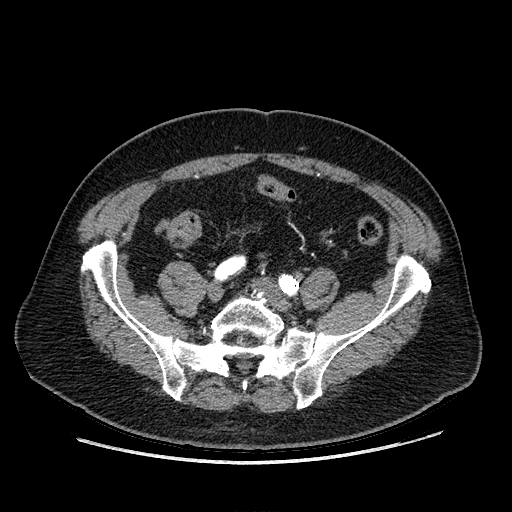
[im 76/190  lung]
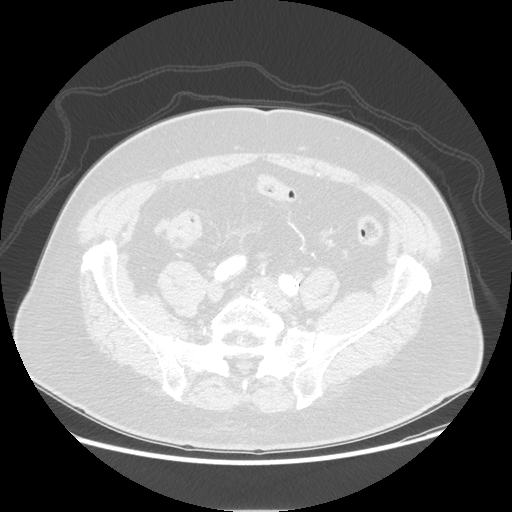
[im 114/190  soft-tissue]
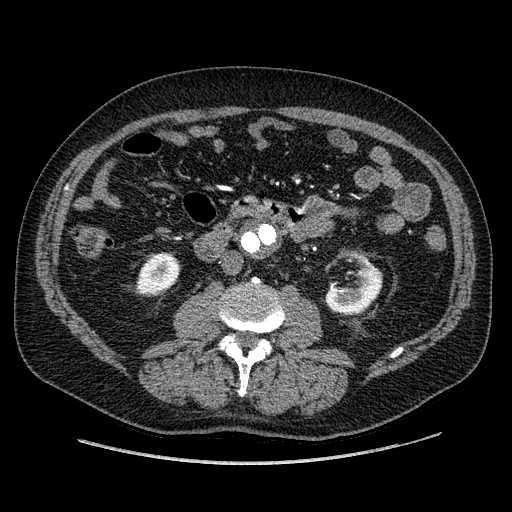
[im 114/190  lung]
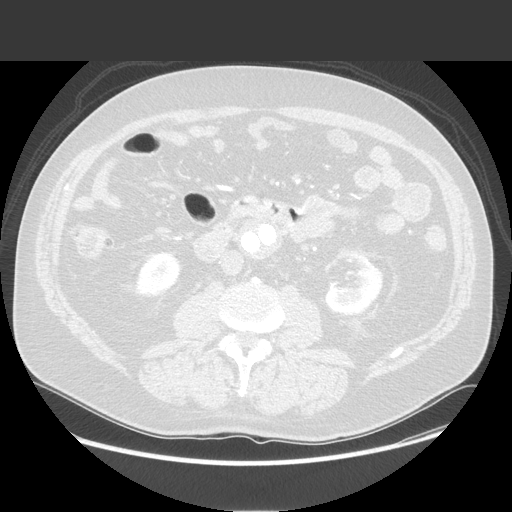
[im 152/190  soft-tissue]
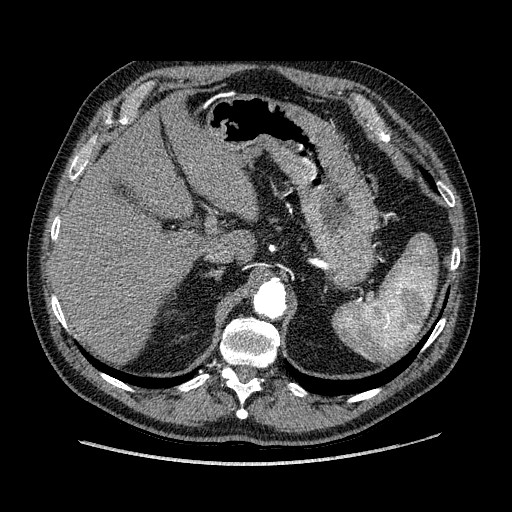
[im 152/190  lung]
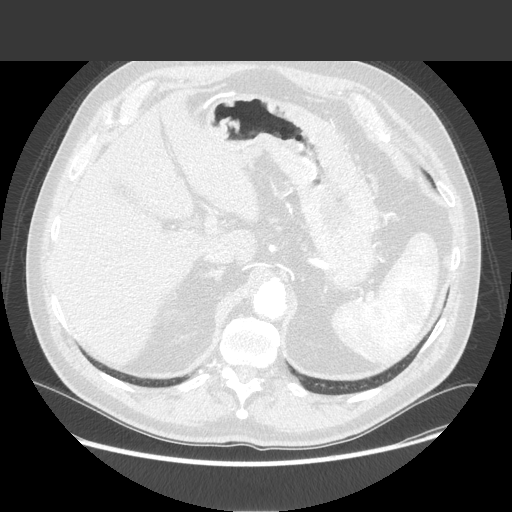

[Series 602: sagittal body · sagittal · 0.92mm/px · 3 of 159 slices shown (1 of 2)]
[im 40/159  soft-tissue]
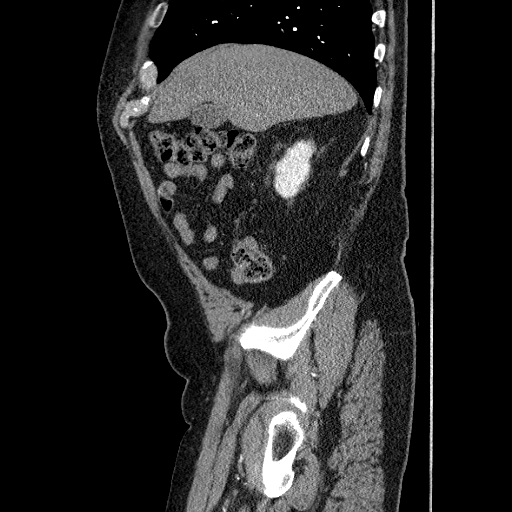
[im 80/159  soft-tissue]
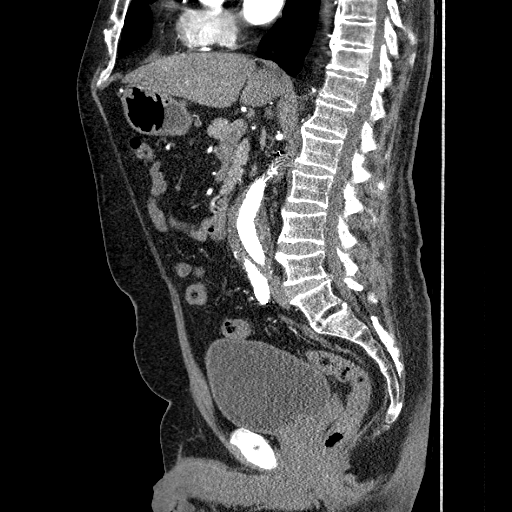
[im 119/159  soft-tissue]
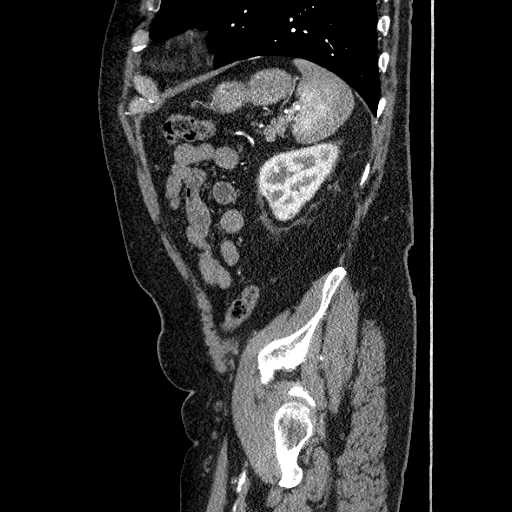

[Series 607: sagittal body · sagittal · 0.80mm/px · 3 of 157 slices shown (2 of 2)]
[im 40/157  soft-tissue]
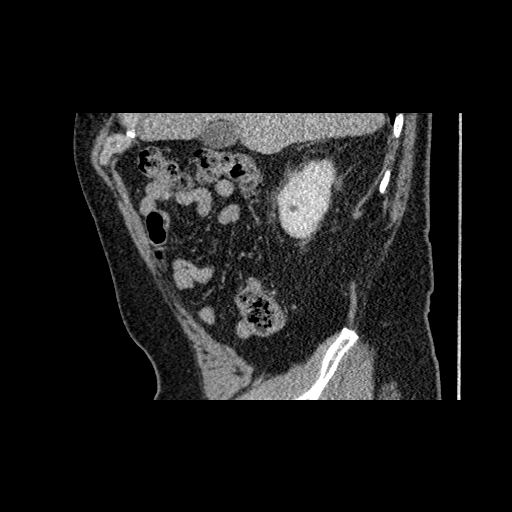
[im 79/157  soft-tissue]
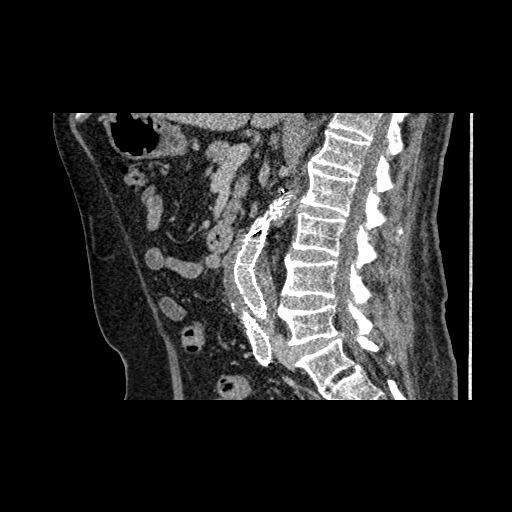
[im 118/157  soft-tissue]
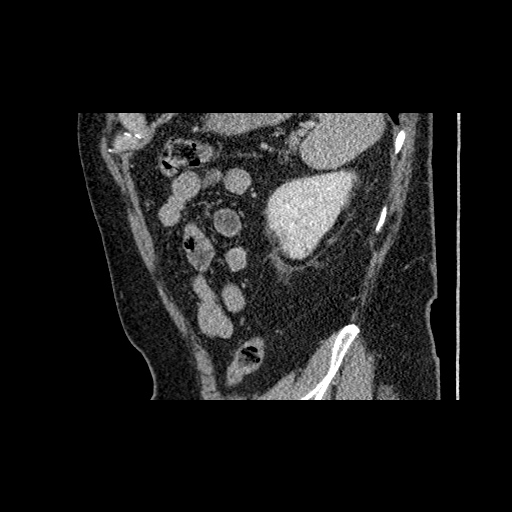

[10 of 36 positions shown; findings below may reference images not displayed]

FINDINGS: ARTERIAL FINDINGS:

Aorta: Irregular nonocclusive mural thrombus in the visualized
distal descending thoracic and suprarenal segments. Patent
bifurcated infrarenal stent graft. No endoleak. Native sac diameter
4.6 x 4 cm, stable.

Celiac axis: Nonocclusive partially calcified ostial plaque, patent
distally

Superior mesenteric: Patent, with replaced right hepatic arterial
supply, an anatomic variant.

Left renal: Single, with nonocclusive partially calcified ostial
plaque, patent distally.

Right renal: Single, with partially calcified nonocclusive ostial
plaque, patent distally.

Inferior mesenteric: Short segment origin occlusion, reconstituted
distally by visceral collaterals.

Left iliac: Patent left limb of stent graft extends to the mid
common iliac. The mild tortuosity in the more distal native iliac
arterial system without aneurysm, dissection or stenosis.

Right iliac: Right limb of stent graft extends to the mid common
iliac. Eccentric nonocclusive plaque in the distal native common
iliac artery which is ectatic up to a diameter of 16 mm. External
iliac mildly tortuous without aneurysm or stenosis. Irregular plaque
in the common femoral artery resulting in short segment mild
stenosis.

Venous findings: Dedicated venous phase imaging was not obtained.
Patency of portal vein, superior mesenteric vein, splenic vein,
bilateral renal veins, and IVC noted.

Review of the MIP images confirms the above findings.

Nonvascular findings: Visualized lung bases clear. Unremarkable
liver, nondilated gallbladder, spleen, adrenal glands. Focal
parenchymal loss in the lower pole left kidney stable. Small right
renal cyst. No hydronephrosis. Stomach, small bowel, and colon are
nondilated. Urinary bladder physiologically distended. Moderate
prostatic enlargement with central coarse calcification. No ascites.
No free air. Mild spondylitic changes in the lower lumbar spine.
IMPRESSION: 1. Patent infrarenal bifurcated aortic stent graft with no endoleak,
stable excluded native aneurysm size 4.6 x 4 cm.
2. Additional ancillary findings as above, stable.

## 2015-06-21 MED ORDER — IOPAMIDOL (ISOVUE-370) INJECTION 76%
75.0000 mL | Freq: Once | INTRAVENOUS | Status: AC | PRN
  Administered 2015-06-21: 75 mL via INTRAVENOUS

## 2015-06-21 NOTE — Progress Notes (Signed)
Subjective:     Patient ID: [name] [name], male   DOB: 09-Feb-1946, 69 y.o.   MRN: [id number]  HPI this 69 year old male returns now 2 years post aortic stent graft of infrarenal abdominal aortic aneurysm. He has done very well from that standpoint. He has had moderate narrowing of his right common femoral artery which has been followed since the stent graft was inserted. He remains asymptomatic. He is able to ambulate 1 mile or greater with no symptoms in the right lower extremity of weakness or tightness. He was found to have a small bilobed mass in the right lower lobe which is followed by Dr. Merilynn Finland. Has not changed much in size over the past 8 years and patient is due to return in July of 2017 for a CT scan of the chest. He does have some chronic cough on occasion but no wheezing or productive cough or hemoptysis.  Past Medical History  Diagnosis Date  . AAA (abdominal aortic aneurysm) ([facility])   . Hypertension   . Hyperlipidemia   . Reflux   . Arthritis     Gout  . Back pain   . GERD (gastroesophageal reflux disease)   . Diabetes mellitus without complication ([place])   . Lung nodule, solitary 2008    RLL, 8 mm in 2008, 14 mm in 2014    Social History  Substance Use Topics  . Smoking status: Former Smoker -- 1.00 packs/day for 20 years    Types: Cigarettes    Quit date: 07/03/1983  . Smokeless tobacco: Never Used  . Alcohol Use: 7.2 oz/week    12 Cans of beer per week    Family History  Problem Relation Age of Onset  . Diabetes Father   . Stroke Father   . Heart attack Father   . Heart attack Brother     X's 2  . Coronary artery disease Mother   . Diabetes Mother   . Heart attack Mother   . Diabetes Sister     No Known Allergies   Current outpatient prescriptions:  .  allopurinol (ZYLOPRIM) 100 MG tablet, Take 200 mg by mouth daily. , Disp: , Rfl:  .  furosemide (LASIX) 40 MG tablet, Take 40 mg by mouth daily., Disp: , Rfl:  .  glimepiride (AMARYL) 1 MG  tablet, Take 1 mg by mouth daily with breakfast. , Disp: , Rfl:  .  HYDROcodone-acetaminophen (NORCO/VICODIN) 5-325 MG per tablet, Take 1 tablet by mouth every 4 (four) hours as needed for pain (for gout)., Disp: 20 tablet, Rfl: 0 .  lisinopril (PRINIVIL,ZESTRIL) 10 MG tablet, Take 10 mg by mouth daily. , Disp: , Rfl:  .  metoprolol succinate (TOPROL-XL) 50 MG 24 hr tablet, Take 50 mg by mouth daily. Take with or immediately following a meal., Disp: , Rfl:  .  omeprazole (PRILOSEC) 20 MG capsule, Take 20 mg by mouth daily., Disp: , Rfl:  .  rosuvastatin (CRESTOR) 20 MG tablet, Take 10 mg by mouth daily. , Disp: , Rfl:  .  tamsulosin (FLOMAX) 0.4 MG CAPS capsule, Take 0.4 mg by mouth daily., Disp: , Rfl:   Filed Vitals:   06/21/15 1231  BP: 136/81  Pulse: 60  Temp: 97.3 F (36.3 C)  Resp: 18  Height: 5\' 9"  (1.753 m)  Weight: 198 lb (89.812 kg)  SpO2: 98%    Body mass index is 29.23 kg/(m^2).           Review of Systems  Denies chest pain,  dyspnea on exertion, PND, orthopnea, hemoptysis, claudication, lateralizing weakness. Other systems negative and a Fleet review of systems     Objective:   Physical Exam BP 136/81 mmHg  Pulse 60  Temp(Src) 97.3 F (36.3 C)  Resp 18  Ht 5\' 9"  (1.753 m)  Wt 198 lb (89.812 kg)  BMI 29.23 kg/m2  SpO2 98%  Gen.-alert and oriented x3 in no apparent distress HEENT normal for age Lungs no rhonchi or wheezing Cardiovascular regular rhythm no murmurs carotid pulses 3+ palpable no bruits audible Abdomen soft nontender no palpable masses Musculoskeletal free of  major deformities Skin clear -no rashes Neurologic normal Lower extremities 3+ femoral and dorsalis pedis pulses palpable bilaterally with no edema   today I ordered duplex scan of the right femoral artery which I reviewed and interpreted. There continues to be elevated velocity in the right common femoral artery at 357 cm/s consistent with a moderate stenosis. Also ordered ABIs  which revealed them to be normal with triphasic flow in the right posterior tibial and  dorsalis pedis arteries  also ordered a CT angiogram of the abdomen and pelvis which I reviewed by computer and also reviewed the radiologist's interpretation. The aortic stent graft is in excellent position. There is no evidence of endoleak or migration of the graft. The sac continues to be about 4.6 cm in diameter surrounding the graft.     Assessment:      nicely functioning endograft for abdominal aortic aneurysm which was inserted 2 years ago  Moderate stenosis right common femoral arteryasymptomatic  small right lower lobe lung mass which has been noted for the past 8 years and is followed by Dr. Merilynn Finland -follow-up scheduled in July 2017     Plan:      return in 1 year with duplex scan right femoral artery and check ABIs. We'll also do duplex scan of aortic stent graft in the office with no CT angiogram performed next year

## 2015-06-22 NOTE — Addendum Note (Signed)
Addended by: Dorthula Rue L on: 06/22/2015 02:25 PM   Modules accepted: Orders

## 2015-12-28 ENCOUNTER — Other Ambulatory Visit: Payer: Self-pay | Admitting: *Deleted

## 2015-12-28 DIAGNOSIS — R911 Solitary pulmonary nodule: Secondary | ICD-10-CM

## 2016-01-17 ENCOUNTER — Encounter: Payer: Self-pay | Admitting: Thoracic Surgery (Cardiothoracic Vascular Surgery)

## 2016-01-17 ENCOUNTER — Ambulatory Visit
Admission: RE | Admit: 2016-01-17 | Discharge: 2016-01-17 | Disposition: A | Discharge status: Home / Self Care | Payer: Medicare Other | Source: Ambulatory Visit | Attending: Thoracic Surgery (Cardiothoracic Vascular Surgery) | Admitting: Thoracic Surgery (Cardiothoracic Vascular Surgery)

## 2016-01-17 ENCOUNTER — Ambulatory Visit (INDEPENDENT_AMBULATORY_CARE_PROVIDER_SITE_OTHER): Payer: Medicare Other | Admitting: Thoracic Surgery (Cardiothoracic Vascular Surgery)

## 2016-01-17 VITALS — BP 120/70 | HR 59 | Resp 16 | Ht 69.0 in | Wt 197.6 lb

## 2016-01-17 DIAGNOSIS — R918 Other nonspecific abnormal finding of lung field: Secondary | ICD-10-CM

## 2016-01-17 DIAGNOSIS — R911 Solitary pulmonary nodule: Secondary | ICD-10-CM

## 2016-01-17 IMAGING — CT CT CHEST W/O CM
3 of 4 series · 16 of 30 positions shown, 18 images · non-contrast
Comparison: CT chest of [DATE] and [DATE]

CLINICAL DATA: Followup lung nodules, former smoking history,
diabetes

EXAM:
CT CHEST WITHOUT CONTRAST
TECHNIQUE: Multidetector CT imaging of the chest was performed following the
standard protocol without IV contrast.

[Series 3: chest w/o · axial · non-contrast · 0.76mm/px · z∈[-284,-31]mm · 7 of 135 slices shown, 9 images]
[im 17/135  mediastinal]
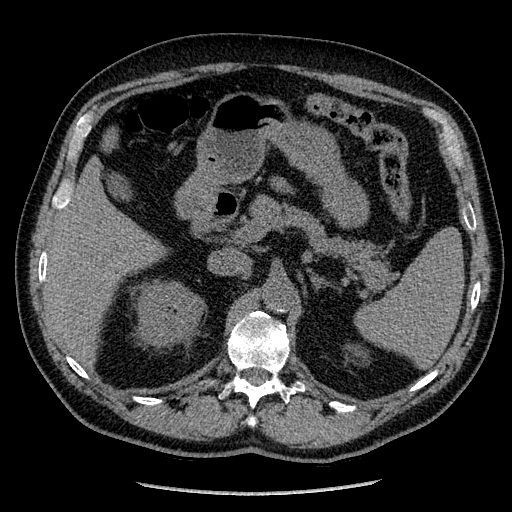
[im 17/135  lung]
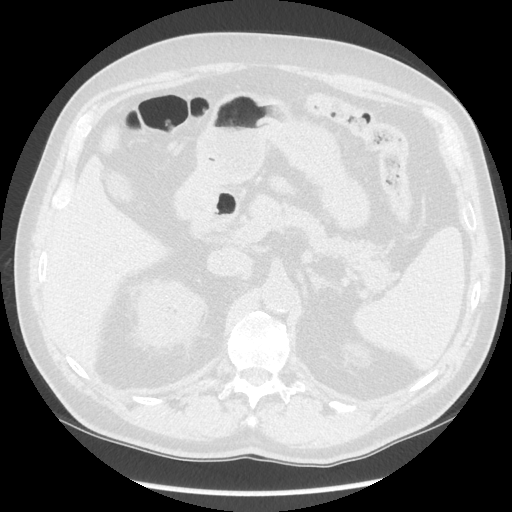
[im 34/135  lung]
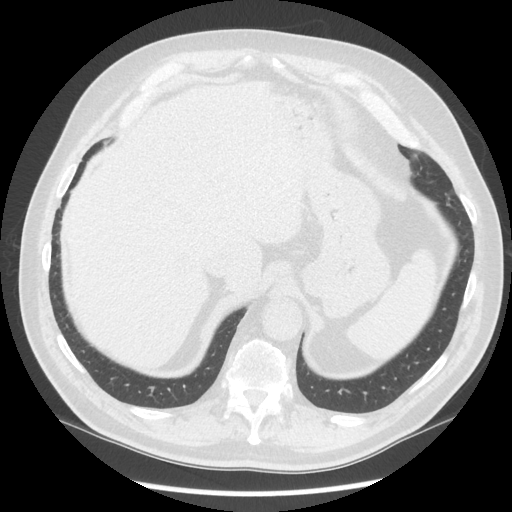
[im 51/135  lung]
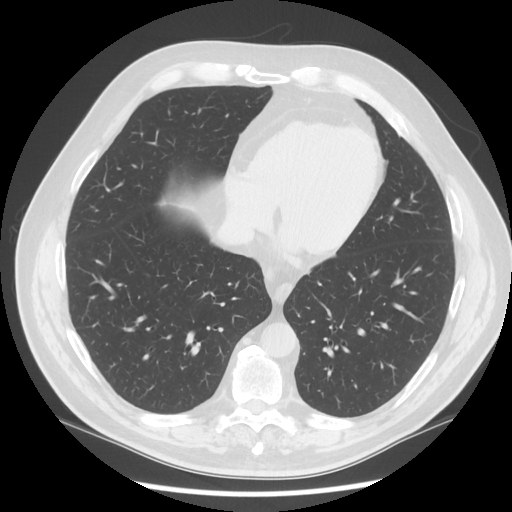
[im 68/135  lung]
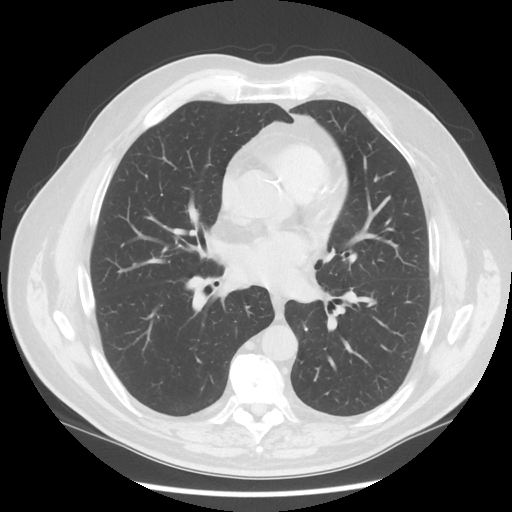
[im 84/135  mediastinal]
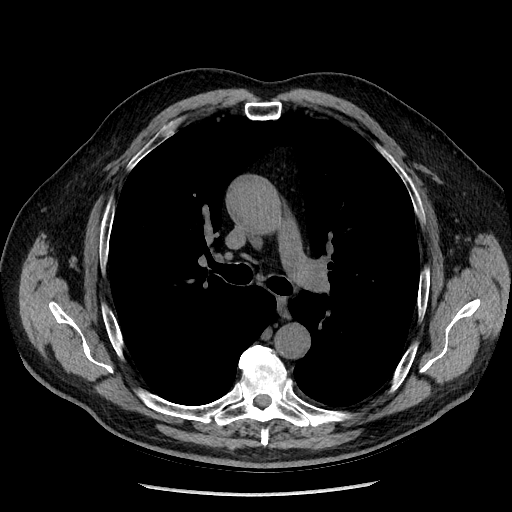
[im 84/135  lung]
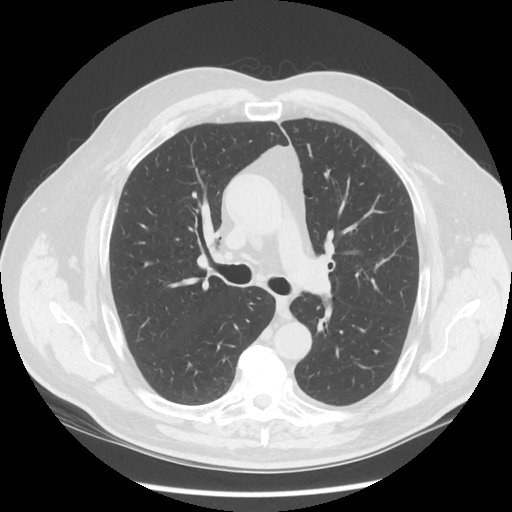
[im 101/135  lung]
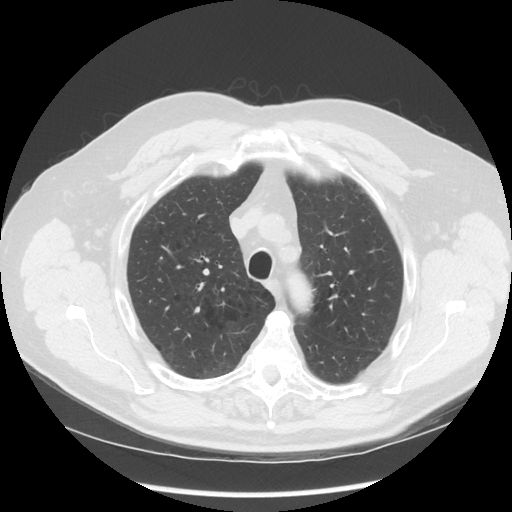
[im 118/135  lung]
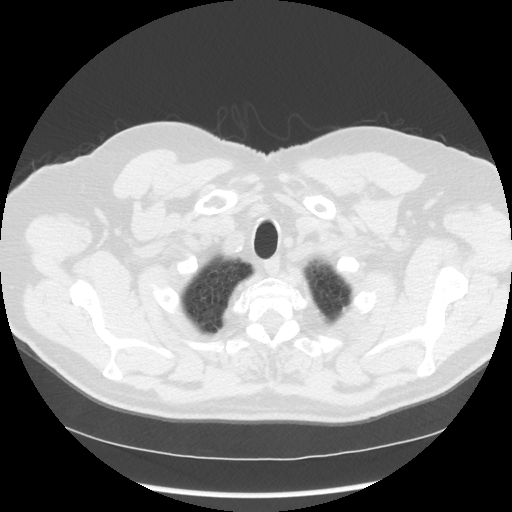

[Series 4: lung windows · axial · 0.76mm/px · z∈[-284,-31]mm · 7 of 135 slices shown]
[im 17/135  lung]
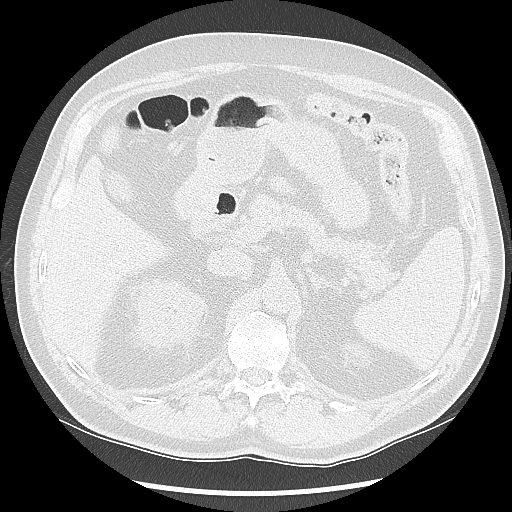
[im 34/135  lung]
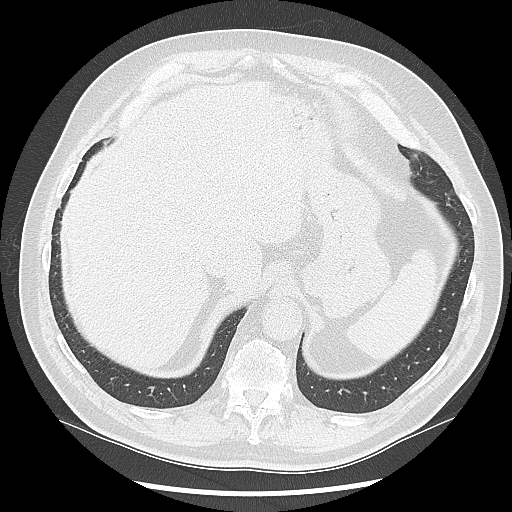
[im 51/135  lung]
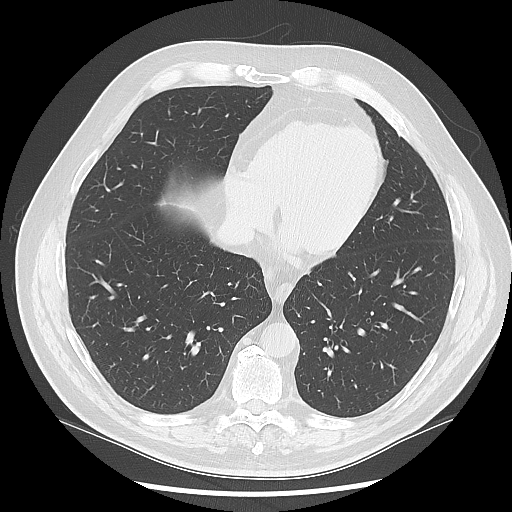
[im 68/135  lung]
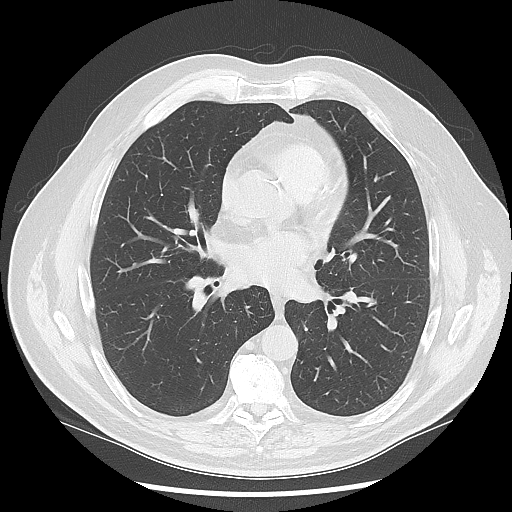
[im 84/135  lung]
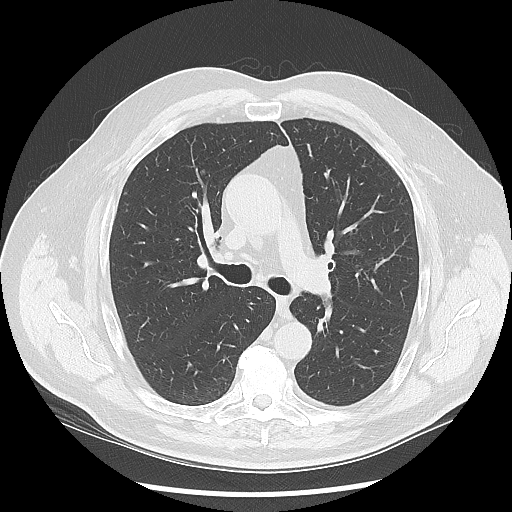
[im 101/135  lung]
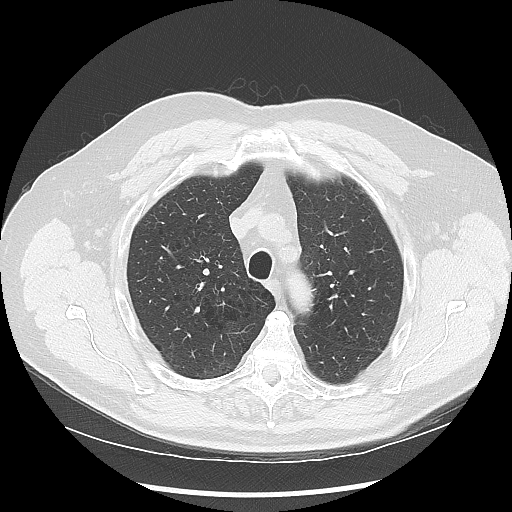
[im 118/135  lung]
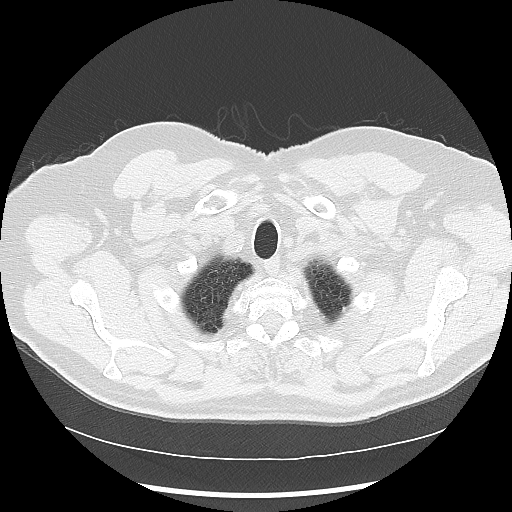

[Series 602: sagittal body · sagittal · 0.76mm/px · 2 of 156 slices shown]
[im 18/156  mediastinal]
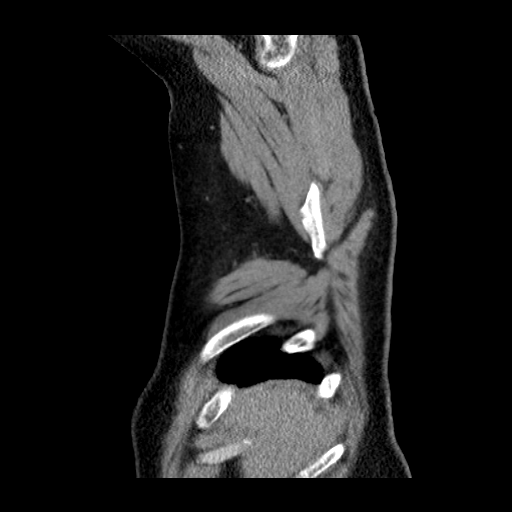
[im 35/156  mediastinal]
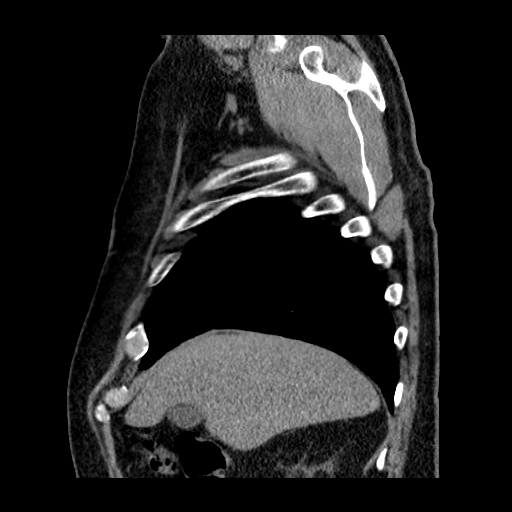

[16 of 30 positions shown; findings below may reference images not displayed]

FINDINGS: Cardiovascular: Calcification of the left anterior descending and to
a lesser degree of the right coronary artery is again noted. The
heart is within normal limits in size. No pericardial effusion is
seen.

Mediastinum/Nodes: On this unenhanced study, no mediastinal or hilar
adenopathy is noted. The thyroid gland is normal in size.

Lungs/Pleura: Diffuse changes of centrilobular emphysema are noted.
The lobular nodule present within the right lower lobe measures 16 x
7 mm compared to 15 x 9 mm on the CT from [DATE] and 13 x 8 on
the CT from [DATE]. Therefore this is consistent with a benign
process. This nodule however has enlarged compared to the CT from
[PZ], but in view of the very slow growth, a benign lesion is
strongly favored. If suspicious clinically PET-CT could be performed
to assess the level of metabolic activity. Additionally, there is a
5 mm noncalcified nodule anteriorly within the inferior right upper
lobe which has been present and has not changed consistent with a
benign process as well. No new pulmonary nodule is seen. The central
airway is patent. No parenchymal infiltrate or pleural effusion is
seen.

Upper Abdomen: The portion of the upper abdomen that is visualized
is unremarkable other than the presence of an abdominal aortic
endovascular stent.

Musculoskeletal: There are degenerative changes throughout the
thoracic spine. No compression deformity is seen.
IMPRESSION: 1. Relative stability of the lobular nodule within the right lower
lobe when compared to prior CTs, consistent with a slow growing and
most likely benign process.
2. Stable 5 mm nodule within the anterior inferior right upper lobe
also consistent with a benign process.
3. Centrilobular emphysema.
4. Coronary artery calcifications as noted above.

## 2016-01-17 NOTE — Progress Notes (Signed)
[address]       [place] [address]             [phone]       HPI: Mr. Ayson returns for a scheduled 1 year follow-up visit.  He is a 70 year old gentleman who is being followed for a right lower lobe nodule. He had a CT of the abdomen and pelvis for followup after a stent graft for an abdominal aortic aneurysm in June of 2015. On that scan he was noted to have a 13 x 8 mm bilobed nodule in the right lower lobe. A retrospective review of his previous CT scans showed this lesion had been present since 2008 when it was 8 mm. The nodule showed no definite growth in the interval between October of 2014 and June of 2015. I first saw him a year ago. Given the slow overall growth and recent stability recommended continued radiographic follow-up. We followed him at six-month intervals for a couple of years and the nodule was stable to slightly larger.  In the interim since his last visit he has been doing well. He continues to have a cough with white sputum. He denies any fevers, chills, or sweats. He has not had any hemoptysis. He also denies shortness of breath, wheezing and weight loss. His exercise tolerance is good and he remains active.   Zubrod = 0.  Past Medical History  Diagnosis Date  . AAA (abdominal aortic aneurysm) ([facility])   . Hypertension   . Hyperlipidemia   . Reflux   . Arthritis     Gout  . Back pain   . GERD (gastroesophageal reflux disease)   . Diabetes mellitus without complication ([facility])   . Lung nodule, solitary 2008    RLL, 8 mm in 2008, 14 mm in 2014    Current Outpatient Prescriptions  Medication Sig Dispense Refill  . allopurinol (ZYLOPRIM) 100 MG tablet Take 200 mg by mouth daily.     . furosemide (LASIX) 40 MG tablet Take 40 mg by mouth daily.    Marland Kitchen glimepiride (AMARYL) 1 MG tablet Take 1 mg by mouth daily with breakfast.     . HYDROcodone-acetaminophen (NORCO/VICODIN) 5-325 MG per tablet Take 1 tablet by mouth every 4 (four) hours  as needed for pain (for gout). 20 tablet 0  . lisinopril (PRINIVIL,ZESTRIL) 10 MG tablet Take 10 mg by mouth daily.     . metoprolol succinate (TOPROL-XL) 50 MG 24 hr tablet Take 50 mg by mouth daily. Take with or immediately following a meal.    . omeprazole (PRILOSEC) 20 MG capsule Take 20 mg by mouth daily.    . rosuvastatin (CRESTOR) 20 MG tablet Take 10 mg by mouth daily.     . tamsulosin (FLOMAX) 0.4 MG CAPS capsule Take 0.4 mg by mouth daily.     No current facility-administered medications for this visit.    Physical Exam BP 120/70 mmHg  Pulse 59  Resp 16  Ht 5\' 9"  (1.753 m)  Wt 197 lb 9.6 oz (89.631 kg)  BMI 29.17 kg/m2  SpO49 6% 70 year old man in no acute distress Well-developed and well-nourished Alert and oriented 3 with no focal deficits Lungs clear with equal breath sounds bilaterally Cardiac regular rate and rhythm normal S1 and S2  Diagnostic Tests: CT CHEST WITHOUT CONTRAST  TECHNIQUE: Multidetector CT imaging of the chest was performed following the standard protocol without IV contrast.  COMPARISON: CT chest of 01/11/2015 and 12/10/2013  FINDINGS: Cardiovascular: Calcification of the left anterior descending and to a lesser degree of the right coronary artery is again noted. The heart is within normal limits in size. No pericardial effusion is seen.  Mediastinum/Nodes: On this unenhanced study, no mediastinal or hilar adenopathy is noted. The thyroid gland is normal in size.  Lungs/Pleura: Diffuse changes of centrilobular emphysema are noted. The lobular nodule present within the right lower lobe measures 16 x 7 mm compared to 15 x 9 mm on the CT from 01/11/2015 and 13 x 8 on the CT from 12/10/2013. Therefore this is consistent with a benign process. This nodule however has enlarged compared to the CT from 2008, but in view of the very slow growth, a benign lesion is strongly favored. If suspicious clinically PET-CT could be performed to  assess the level of metabolic activity. Additionally, there is a 5 mm noncalcified nodule anteriorly within the inferior right upper lobe which has been present and has not changed consistent with a benign process as well. No new pulmonary nodule is seen. The central airway is patent. No parenchymal infiltrate or pleural effusion is seen.  Upper Abdomen: The portion of the upper abdomen that is visualized is unremarkable other than the presence of an abdominal aortic endovascular stent.  Musculoskeletal: There are degenerative changes throughout the thoracic spine. No compression deformity is seen.  IMPRESSION: 1. Relative stability of the lobular nodule within the right lower lobe when compared to prior CTs, consistent with a slow growing and most likely benign process. 2. Stable 5 mm nodule within the anterior inferior right upper lobe also consistent with a benign process. 3. Centrilobular emphysema. 4. Coronary artery calcifications as noted above.   Electronically Signed  By: Ivar Drape M.D.  On: 01/17/2016 11:59  I personally reviewed the CT images and concur with findings as noted above.  Impression: 70 year old man with a 15 x 8 mm nodule in the right lower lobe and a smaller 6-7 mm nodule in the right upper lobe. These nodules have been present for many years and have been stable to very slowly growing. 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  I think that continued radiographic follow-up is the best option. Surgical resection is certainly possible, but I think it's unnecessary. Because the nodules continue to slowly grow I do think we should continue with radiographic follow-up is to be on the safe side.  Plan:  Return in one year with CT chest  Melrose Nakayama, MD [facility] [phone]

## 2016-06-12 DIAGNOSIS — I129 Hypertensive chronic kidney disease with stage 1 through stage 4 chronic kidney disease, or unspecified chronic kidney disease: Secondary | ICD-10-CM

## 2016-06-12 DIAGNOSIS — R55 Syncope and collapse: Secondary | ICD-10-CM

## 2016-06-12 DIAGNOSIS — E785 Hyperlipidemia, unspecified: Secondary | ICD-10-CM | POA: Insufficient documentation

## 2016-06-12 HISTORY — DX: Hypertensive chronic kidney disease with stage 1 through stage 4 chronic kidney disease, or unspecified chronic kidney disease: I12.9

## 2016-06-12 HISTORY — DX: Syncope and collapse: R55

## 2016-06-21 ENCOUNTER — Encounter: Payer: Self-pay | Admitting: Family

## 2016-06-22 ENCOUNTER — Ambulatory Visit (HOSPITAL_COMMUNITY)
Admission: RE | Admit: 2016-06-22 | Discharge: 2016-06-22 | Disposition: A | Discharge status: Home / Self Care | Payer: Medicare Other | Source: Ambulatory Visit | Attending: Vascular Surgery | Admitting: Vascular Surgery

## 2016-06-22 ENCOUNTER — Ambulatory Visit (INDEPENDENT_AMBULATORY_CARE_PROVIDER_SITE_OTHER)
Admission: RE | Admit: 2016-06-22 | Discharge: 2016-06-22 | Disposition: A | Discharge status: Home / Self Care | Payer: Medicare Other | Source: Ambulatory Visit | Attending: Vascular Surgery | Admitting: Vascular Surgery

## 2016-06-22 DIAGNOSIS — Z95828 Presence of other vascular implants and grafts: Secondary | ICD-10-CM | POA: Diagnosis not present

## 2016-06-22 DIAGNOSIS — I10 Essential (primary) hypertension: Secondary | ICD-10-CM | POA: Insufficient documentation

## 2016-06-22 DIAGNOSIS — Z87891 Personal history of nicotine dependence: Secondary | ICD-10-CM | POA: Diagnosis not present

## 2016-06-22 DIAGNOSIS — E119 Type 2 diabetes mellitus without complications: Secondary | ICD-10-CM | POA: Insufficient documentation

## 2016-06-22 DIAGNOSIS — I714 Abdominal aortic aneurysm, without rupture, unspecified: Secondary | ICD-10-CM

## 2016-06-22 DIAGNOSIS — E785 Hyperlipidemia, unspecified: Secondary | ICD-10-CM | POA: Insufficient documentation

## 2016-06-22 DIAGNOSIS — I70201 Unspecified atherosclerosis of native arteries of extremities, right leg: Secondary | ICD-10-CM | POA: Insufficient documentation

## 2016-06-26 ENCOUNTER — Ambulatory Visit: Payer: Medicare Other | Admitting: Family

## 2016-06-26 ENCOUNTER — Other Ambulatory Visit (HOSPITAL_COMMUNITY): Payer: Medicare Other

## 2016-06-26 ENCOUNTER — Encounter (HOSPITAL_COMMUNITY): Payer: Medicare Other

## 2016-07-04 ENCOUNTER — Encounter: Payer: Self-pay | Admitting: Family

## 2016-07-04 ENCOUNTER — Ambulatory Visit (INDEPENDENT_AMBULATORY_CARE_PROVIDER_SITE_OTHER): Payer: Medicare Other | Admitting: Family

## 2016-07-04 VITALS — BP 115/75 | HR 85 | Temp 97.2°F | Resp 20 | Ht 69.0 in | Wt 204.3 lb

## 2016-07-04 DIAGNOSIS — I70201 Unspecified atherosclerosis of native arteries of extremities, right leg: Secondary | ICD-10-CM

## 2016-07-04 DIAGNOSIS — I714 Abdominal aortic aneurysm, without rupture, unspecified: Secondary | ICD-10-CM

## 2016-07-04 DIAGNOSIS — Z95828 Presence of other vascular implants and grafts: Secondary | ICD-10-CM

## 2016-07-04 NOTE — Patient Instructions (Signed)

## 2016-07-04 NOTE — Progress Notes (Signed)
[facility] [place]  CC: Follow up s/p EVAR  History of Present Illness  [name] [name] is a 71 y.o. (1946/03/21) male who is s/p aortic stent graft of infrarenal abdominal aortic aneurysm on 04-29-13 by Dr. Kellie Simmering. He has moderate narrowing of his right common femoral artery which has been followed since the stent graft was inserted. He remains asymptomatic.   He had severe intermittent back pain prior to the stent graft surgery, but none since then.  He was found to have a small bilobed mass in the right lower lobe which is followed by Dr. Merilynn Finland. Has not changed much in size since 2008;  patient is due to return in July of 2018 for a CT scan of the chest. He does have some chronic cough on occasion but no wheezing or productive cough or hemoptysis; states his chronic cough started after he started taking lisinopril.  Dr. Kellie Simmering last evaluated pt on 06-21-15. At that time duplex scan of the right femoral artery showed continued elevated velocity in the right common femoral artery at 357 cm/s consistent with a moderate stenosis. ABIs were normal with triphasic flow in the right posterior tibial and  dorsalis pedis arteries CT angiogram of the abdomen and pelvis demonstrated the aortic stent graft was in excellent position. There was no evidence of endoleak or migration of the graft. The sac continued to be about 4.6 cm in diameter surrounding the graft.  Pt denies any claudication symptoms with walking. He walks a mile every morning.   He denies any known history of stroke or TIA. He denies any known cardiac disease.  Pt Diabetic: Yes, states his last A1C result was 7.0 Pt smoker: former smoker, quit about 1983, started at age 63 years   Past Medical History:  Diagnosis Date  . AAA (abdominal aortic aneurysm) ([place])   . Arthritis    Gout  . Back pain   . Diabetes mellitus without complication ([place])   . GERD (gastroesophageal reflux disease)   .  Hyperlipidemia   . Hypertension   . Lung nodule, solitary 2008   RLL, 8 mm in 2008, 14 mm in 2014  . Reflux    Past Surgical History:  Procedure Laterality Date  . ABDOMINAL AORTIC ENDOVASCULAR STENT GRAFT N/A 04/29/2013   Procedure: ABDOMINAL AORTIC ENDOVASCULAR STENT GRAFT -GORE;  Surgeon: Mal Misty, MD;  Location: [facility];  Service: Vascular;  Laterality: N/A;  . COLONOSCOPY  2010  . VASECTOMY     Social History Social History  Substance Use Topics  . Smoking status: Former Smoker    Packs/day: 1.00    Years: 20.00    Types: Cigarettes    Quit date: 07/03/1983  . Smokeless tobacco: Never Used  . Alcohol use 7.2 oz/week    12 Cans of beer per week   Family History Family History  Problem Relation Age of Onset  . Diabetes Father   . Stroke Father   . Heart attack Father   . Heart attack Brother     X's 2  . Coronary artery disease Mother   . Diabetes Mother   . Heart attack Mother   . Diabetes Sister    Current Outpatient Prescriptions on File Prior to Visit  Medication Sig Dispense Refill  . allopurinol (ZYLOPRIM) 100 MG tablet Take 200 mg by mouth daily.     . furosemide (LASIX) 40 MG tablet Take 40 mg by mouth daily.    Marland Kitchen glimepiride (AMARYL) 1 MG  tablet Take 1 mg by mouth daily with breakfast.     . HYDROcodone-acetaminophen (NORCO/VICODIN) 5-325 MG per tablet Take 1 tablet by mouth every 4 (four) hours as needed for pain (for gout). 20 tablet 0  . lisinopril (PRINIVIL,ZESTRIL) 10 MG tablet Take 10 mg by mouth daily.     . metoprolol succinate (TOPROL-XL) 50 MG 24 hr tablet Take 50 mg by mouth daily. Take with or immediately following a meal.    . omeprazole (PRILOSEC) 20 MG capsule Take 20 mg by mouth daily.    . rosuvastatin (CRESTOR) 20 MG tablet Take 10 mg by mouth daily.     . tamsulosin (FLOMAX) 0.4 MG CAPS capsule Take 0.4 mg by mouth daily.     No current facility-administered medications on file prior to visit.    No Known Allergies   ROS: See HPI  for pertinent positives and negatives.  Physical Examination  Vitals:   07/04/16 1331  BP: 115/75  Pulse: 85  Resp: 20  Temp: 97.2 F (36.2 C)  TempSrc: Oral  SpO2: 95%  Weight: 204 lb 4.8 oz (92.7 kg)  Height: 5\' 9"  (1.753 m)   Body mass index is 30.17 kg/m.  General: A&O x 3, WD, mildly obese male  Pulmonary: Sym exp, respirations are non labored, good air movt, CTAB, no rales, rhonchi, or wheezing.  Cardiac: RRR, Nl S1, S2, no murmur appreciated  Vascular: Vessel Right Left  Radial 2+Palpable 2+Palpable  Carotid  without bruit  without bruit  Aorta Not palpable N/A  Femoral Not Palpable 1+Palpable  Popliteal Not palpable Not palpable  PT Not Palpable 2+Palpable  DP 2+Palpable 2+Palpable   Gastrointestinal: soft, NTND, -G/R, - HSM, - palpable masses, - CVAT B.  Musculoskeletal: M/S 5/5 throughout, extremities without ischemic changes.  Neurologic: Pain and light touch intact in extremities, Motor exam as listed above. CN 2-12 intact.   Non-Invasive Vascular Imaging (Date: 06-22-16)  EVAR Duplex   AAA sac size: 4.5 cm x 4.4 cm; right CIA: 1.3 cm; left CIA: 1.5 cm  no endoleak detected  CT on 06-21-15: 4.6 cm  Right LE arterial Duplex: 50-74% diameter reduction in the right CFA with PSV of 148 to 343 cm/s. No significant change compared to the last exam on 06-21-15.  ABI's: Right: 1.04 (1.2 on 06-21-15), TBI: 0.85 (normal) Left: 1.08 (1.3), TBI: 0.94 (normal) All waveforms appear to be triphasic. Stable and normal ABI's and TBI's.   Medical Decision Making  Coston Shive is a 71 y.o. male  who is s/p aortic stent graft of infrarenal abdominal aortic aneurysm on 04-29-13 by Dr. Kellie Simmering. He has moderate narrowing of his right common femoral artery which has been followed since the stent graft was inserted.  He remains asymptomatic, no claudication symptoms, he walks a mile daily.   Stable EVAR sac size. ABI's remain normal. Right CFA stenosis remains  stable and moderate.   His atherosclerotic risk factors include controlled DM and former smoker.  Pt states he will speak with his PCP re the cough he has been having since he has been taking lisinopril.     He had severe intermittent back pain prior to the stent graft surgery, but none since then.   I discussed with the patient the importance of surveillance of the endograft.  The next endograft duplex will be scheduled for 12 months. Will also continue checking ABI's and right LE arterial duplex.   The patient will follow up with [place] in 12 months with these  studies.  I emphasized the importance of maximal medical management including strict control of blood pressure, blood glucose, and lipid levels, antiplatelet agents, obtaining regular exercise, and cessation of smoking.   Thank you for allowing [place] to participate in this patient's care.  Clemon Chambers, RN, MSN, FNP-C [facility] [place] Office: [phone]  Clinic Physician: Donzetta Matters  07/04/2016, 1:51 PM

## 2016-07-05 NOTE — Addendum Note (Signed)
Addended by: Lianne Cure A on: 07/05/2016 09:52 AM   Modules accepted: Orders

## 2016-08-15 DIAGNOSIS — M21622 Bunionette of left foot: Secondary | ICD-10-CM | POA: Insufficient documentation

## 2016-12-13 ENCOUNTER — Other Ambulatory Visit: Payer: Self-pay | Admitting: Thoracic Surgery (Cardiothoracic Vascular Surgery)

## 2016-12-13 DIAGNOSIS — R918 Other nonspecific abnormal finding of lung field: Secondary | ICD-10-CM

## 2017-01-22 ENCOUNTER — Encounter: Payer: Self-pay | Admitting: Thoracic Surgery (Cardiothoracic Vascular Surgery)

## 2017-01-22 ENCOUNTER — Ambulatory Visit
Admission: RE | Admit: 2017-01-22 | Discharge: 2017-01-22 | Disposition: A | Discharge status: Home / Self Care | Payer: Medicare Other | Source: Ambulatory Visit | Attending: Thoracic Surgery (Cardiothoracic Vascular Surgery) | Admitting: Thoracic Surgery (Cardiothoracic Vascular Surgery)

## 2017-01-22 ENCOUNTER — Ambulatory Visit (INDEPENDENT_AMBULATORY_CARE_PROVIDER_SITE_OTHER): Payer: Medicare Other | Admitting: Thoracic Surgery (Cardiothoracic Vascular Surgery)

## 2017-01-22 VITALS — BP 128/77 | HR 60 | Resp 16 | Ht 69.0 in | Wt 203.4 lb

## 2017-01-22 DIAGNOSIS — R918 Other nonspecific abnormal finding of lung field: Secondary | ICD-10-CM | POA: Diagnosis not present

## 2017-01-22 DIAGNOSIS — I712 Thoracic aortic aneurysm, without rupture: Secondary | ICD-10-CM | POA: Diagnosis not present

## 2017-01-22 DIAGNOSIS — I7121 Aneurysm of the ascending aorta, without rupture: Secondary | ICD-10-CM

## 2017-01-22 IMAGING — CT CT CHEST W/O CM
3 of 4 series · 17 of 30 positions shown, 19 images · non-contrast
Comparison: [DATE], [DATE], [DATE]

CLINICAL DATA: Follow-up lung nodule for several years.

EXAM:
CT CHEST WITHOUT CONTRAST
TECHNIQUE: Multidetector CT imaging of the chest was performed following the
standard protocol without IV contrast.

[Series 3: chest w/o · axial · non-contrast · 0.78mm/px · z∈[-320,-90]mm · 7 of 124 slices shown, 9 images]
[im 16/124  mediastinal]
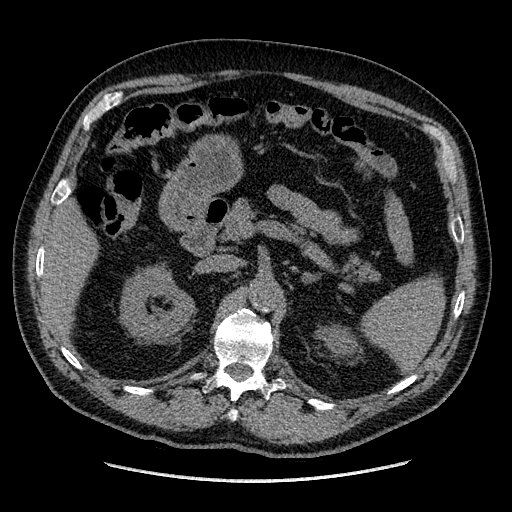
[im 16/124  lung]
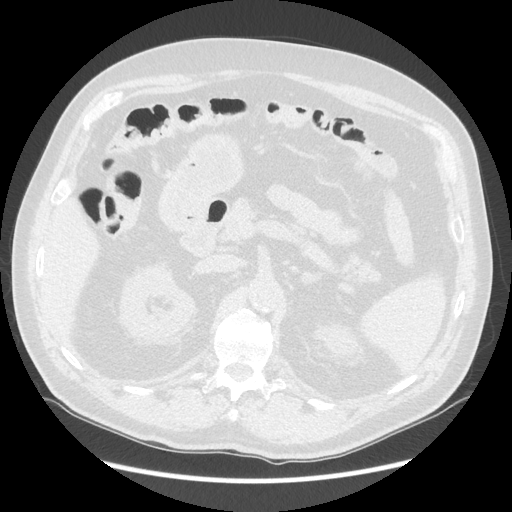
[im 31/124  lung]
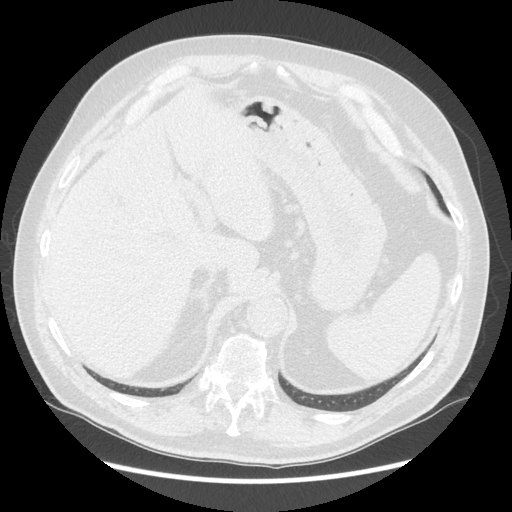
[im 47/124  lung]
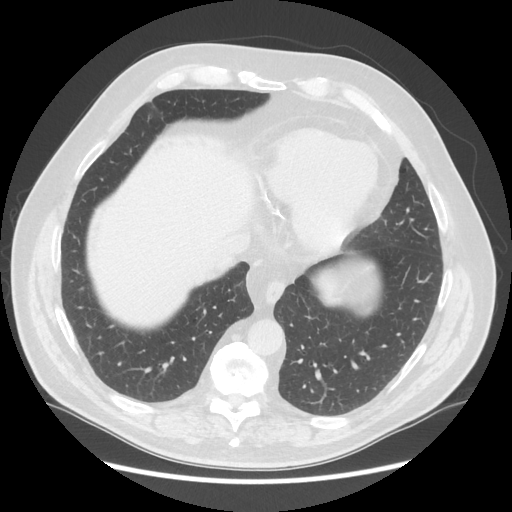
[im 62/124  lung]
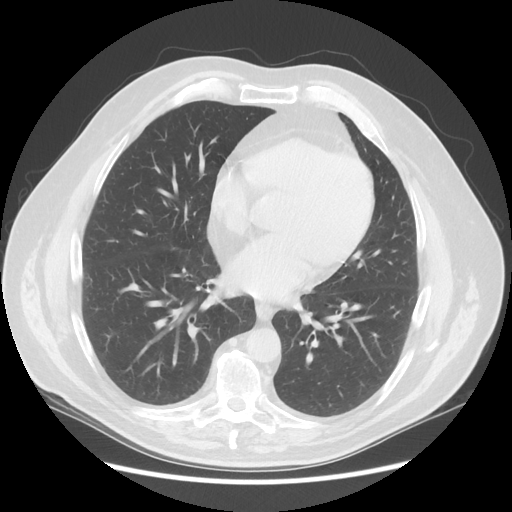
[im 77/124  mediastinal]
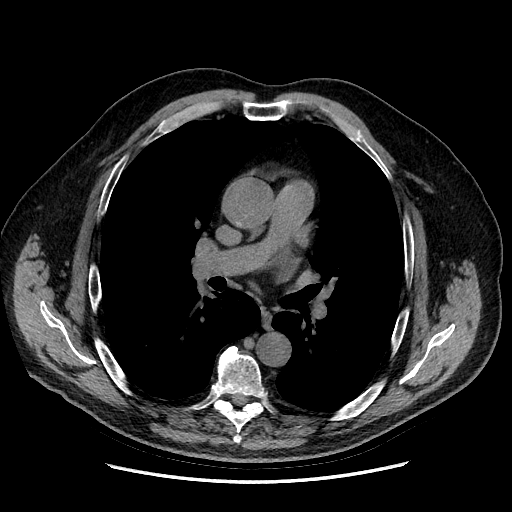
[im 77/124  lung]
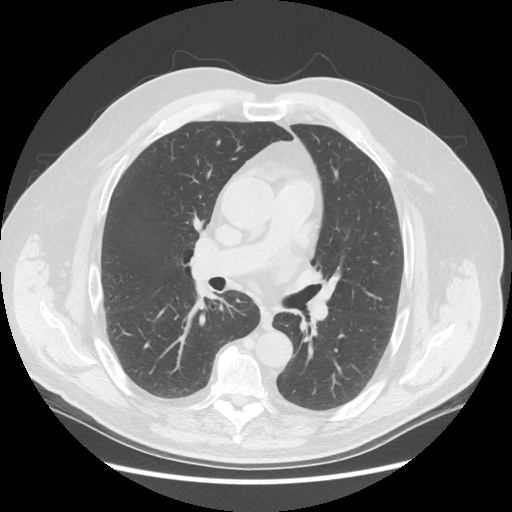
[im 93/124  lung]
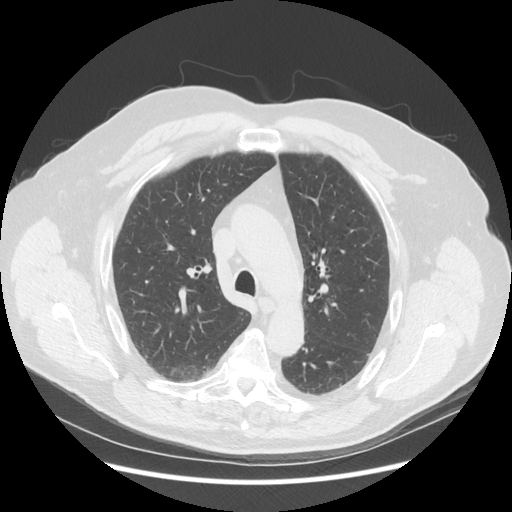
[im 108/124  lung]
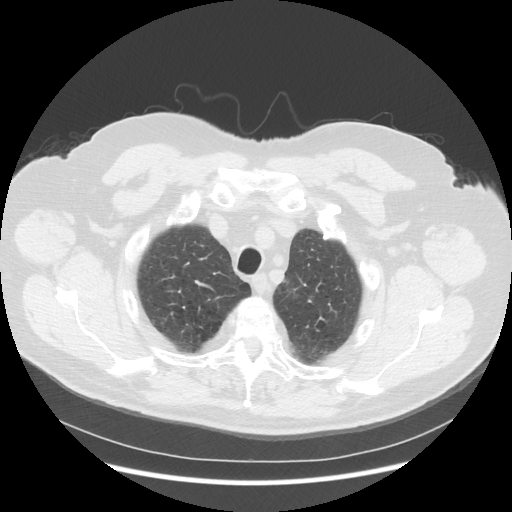

[Series 4: lung windows · axial · 0.78mm/px · z∈[-316,-96]mm · 6 of 124 slices shown]
[im 18/124  lung]
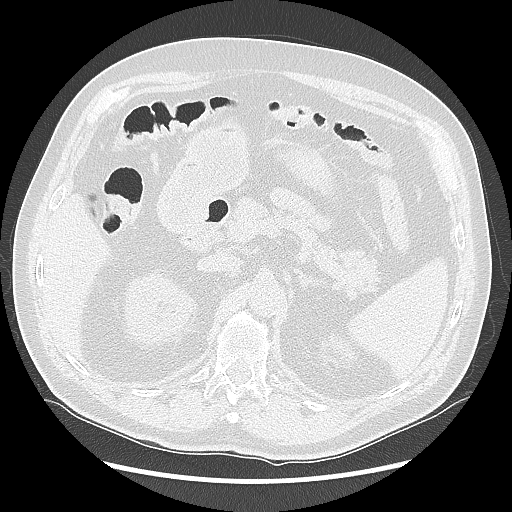
[im 36/124  lung]
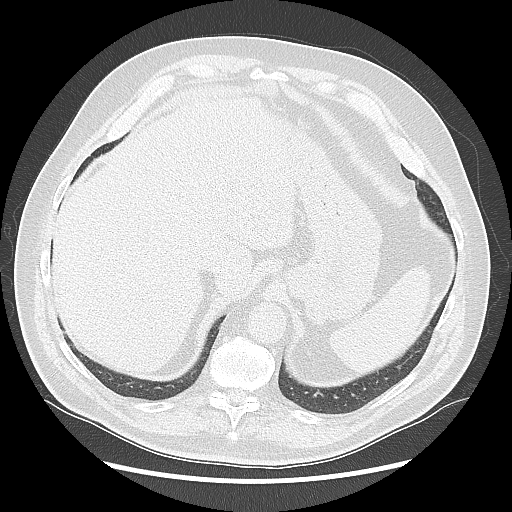
[im 53/124  lung]
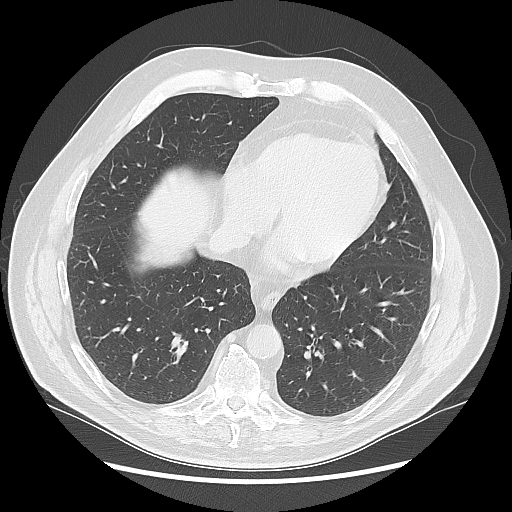
[im 71/124  lung]
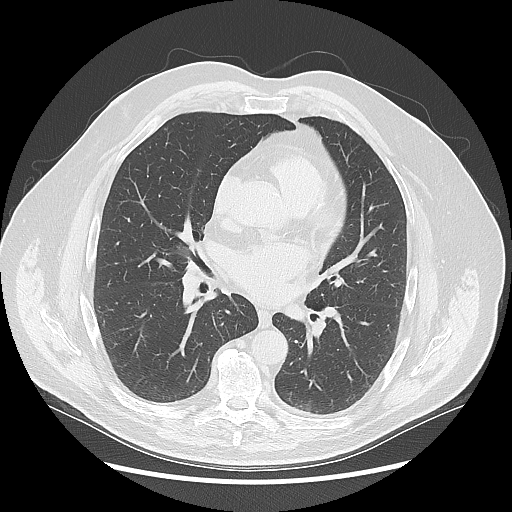
[im 88/124  lung]
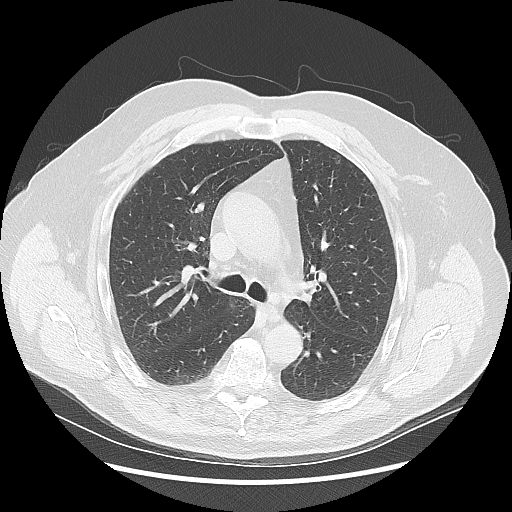
[im 106/124  lung]
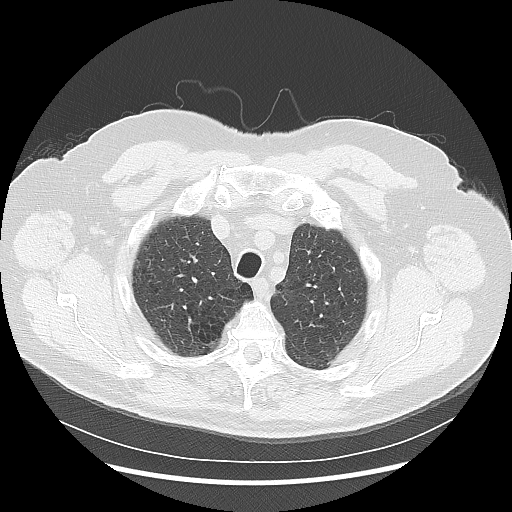

[Series 602: sagittal body · sagittal · 0.78mm/px · 4 of 162 slices shown]
[im 17/162  mediastinal]
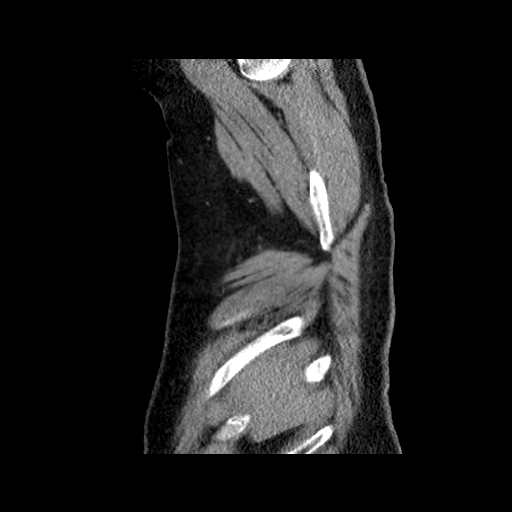
[im 33/162  mediastinal]
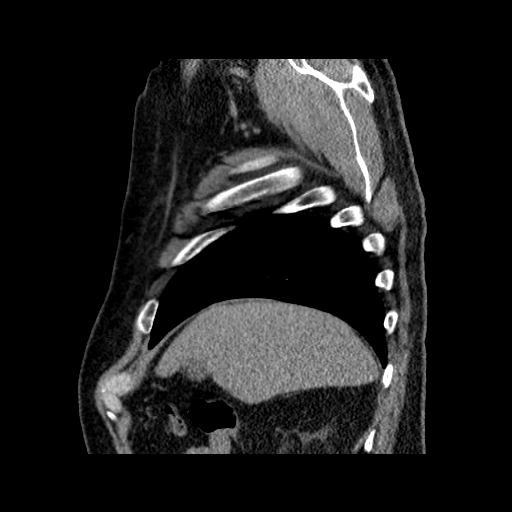
[im 49/162  mediastinal]
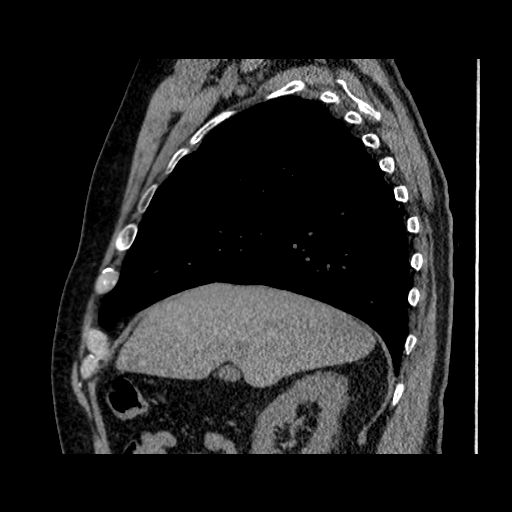
[im 65/162  mediastinal]
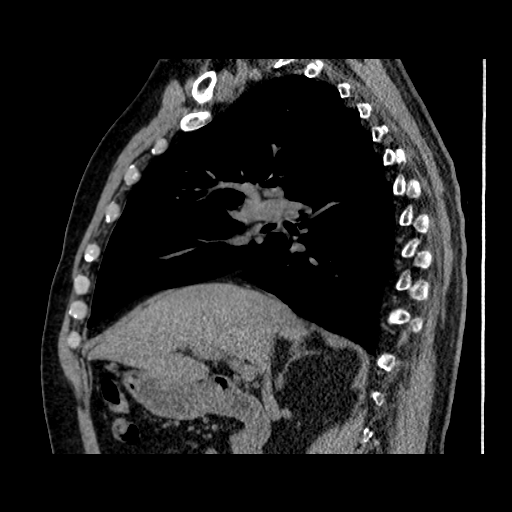

[17 of 30 positions shown; findings below may reference images not displayed]

FINDINGS: Cardiovascular: No significant vascular findings. Normal heart size.
No pericardial effusion. Ascending thoracic aorta measures 4.2 cm in
diameter at the level of the right main pulmonary artery. Thoracic
aortic atherosclerosis. Coronary artery atherosclerosis in the LAD.

Mediastinum/Nodes: No enlarged mediastinal or axillary lymph nodes.
Thyroid gland, trachea, and esophagus demonstrate no significant
findings.

Lungs/Pleura: Mild bilateral centrilobular emphysema. Stable 15 x 8
mm lobular right lower lobe pulmonary nodule unchanged compared with
[DATE]. Stable 6 mm right upper lobe pulmonary nodule similar in
appearance to multiple prior examinations including [DATE]. No
focal consolidation, pleural effusion or pneumothorax.

Upper Abdomen: No acute abnormality.

Musculoskeletal: No acute osseous abnormality. No lytic or sclerotic
osseous lesion.
IMPRESSION: 1. Stable right upper lobe and right lower lobe pulmonary nodule
unchanged compared with [DATE].
2.  Emphysema. ([BL]-[BL])
3.  Aortic Atherosclerosis ([BL]-170.0)
4.  Aortic aneurysm NOS ([BL]-[BL])
5. Ascending thoracic aortic aneurysm measuring 4.2 cm. Recommend
annual imaging followup by CTA or MRA. This recommendation follows
[BL] ACCF/AHA/AATS/ACR/ASA/SCA/BLAIN/BLAIN/BLAIN/BLAIN Guidelines for the
Diagnosis and Management of Patients with Thoracic Aortic Disease.
Circulation. [BL]; 121: [id number]

## 2017-01-22 NOTE — Progress Notes (Signed)
[address]       [place] [address]             [phone]     HPI: [name] returns for a scheduled 1 year follow-up  [name] is a 71 year old man who I have followed for right lower lobe and right upper lobe nodules. Mr. first discovered on a CT of abdomen and pelvis that was done for follow-up after stent graft for abdominal aneurysm. There was a 13 x 8 mm bilobed nodule in the right lower lobe. Looking back at old CTs this nodule had been present since 2008. It has grown slowly over that time.  The last saw him in the office in July 2017. He was doing well at that time and the nodules were stable.  In the interim since his last visit his lisinopril was discontinued due to a persistent cough. He had any chest pain, pressure, or tightness. Shortness of breath. He feels well.  Past Medical History:  Diagnosis Date  . AAA (abdominal aortic aneurysm) ([place])   . Arthritis    Gout  . Back pain   . Diabetes mellitus without complication ([place])   . GERD (gastroesophageal reflux disease)   . Hyperlipidemia   . Hypertension   . Lung nodule, solitary 2008   RLL, 8 mm in 2008, 14 mm in 2014  . Reflux   '  Current Outpatient Prescriptions  Medication Sig Dispense Refill  . allopurinol (ZYLOPRIM) 100 MG tablet Take 200 mg by mouth daily.     . furosemide (LASIX) 40 MG tablet Take 40 mg by mouth daily.    Marland Kitchen glimepiride (AMARYL) 1 MG tablet Take 1 mg by mouth daily with breakfast.     . HYDROcodone-acetaminophen (NORCO/VICODIN) 5-325 MG per tablet Take 1 tablet by mouth every 4 (four) hours as needed for pain (for gout). 20 tablet 0  . metoprolol succinate (TOPROL-XL) 50 MG 24 hr tablet Take 50 mg by mouth daily. Take with or immediately following a meal.    . omeprazole (PRILOSEC) 20 MG capsule Take 20 mg by mouth daily.    . rosuvastatin (CRESTOR) 20 MG tablet Take 10 mg by mouth daily.     . tamsulosin (FLOMAX) 0.4 MG CAPS capsule Take 0.4 mg by mouth  daily.     No current facility-administered medications for this visit.     Physical Exam BP 128/77 (BP Location: Right Arm, Patient Position: Sitting, Cuff Size: Large)   Pulse 60   Resp 16   Ht 5\' 9"  (1.753 m)   Wt 203 lb 6.4 oz (92.3 kg)   SpO2 98% Comment: ON RA  BMI 30.78 kg/m  71 year old man in no acute distress Alert and oriented 3 with no focal deficits Lungs clear with equal breath sounds bilaterally Cardiac regular rate and rhythm normal S1 and S2 Peripheral pulses intact and symmetrical  Diagnostic Tests: CT CHEST WITHOUT CONTRAST  TECHNIQUE: Multidetector CT imaging of the chest was performed following the standard protocol without IV contrast.  COMPARISON:  01/17/2016, 12/10/2013, 01/11/2015  FINDINGS: Cardiovascular: No significant vascular findings. Normal heart size. No pericardial effusion. Ascending thoracic aorta measures 4.2 cm in diameter at the level of the right main pulmonary artery. Thoracic aortic atherosclerosis. Coronary artery atherosclerosis in the LAD.  Mediastinum/Nodes: No enlarged mediastinal or axillary lymph nodes. Thyroid gland, trachea, and esophagus demonstrate no significant findings.  Lungs/Pleura: Mild bilateral centrilobular emphysema. Stable 15 x 8 mm lobular right  lower lobe pulmonary nodule unchanged compared with 12/10/2013. Stable 6 mm right upper lobe pulmonary nodule similar in appearance to multiple prior examinations including 12/10/2013. No focal consolidation, pleural effusion or pneumothorax.  Upper Abdomen: No acute abnormality.  Musculoskeletal: No acute osseous abnormality. No lytic or sclerotic osseous lesion.  IMPRESSION: 1. Stable right upper lobe and right lower lobe pulmonary nodule unchanged compared with 12/10/2013. 2.  Emphysema. (ICD10-J43.9) 3.  Aortic Atherosclerosis (ICD10-170.0) 4.  Aortic aneurysm NOS (ICD10-I71.9) 5. Ascending thoracic aortic aneurysm measuring 4.2 cm.  Recommend annual imaging followup by CTA or MRA. This recommendation follows 2010 ACCF/AHA/AATS/ACR/ASA/SCA/SCAI/SIR/STS/SVM Guidelines for the Diagnosis and Management of Patients with Thoracic Aortic Disease. Circulation. 2010; 121: [id number]   Electronically Signed   By: Kathreen Devoid   On: 01/22/2017 12:01 I personally reviewed the CT chest and concur with the findings noted above.  Impression: Lung nodules- both the right lower lobe and right upper lobe pulmonary nodules are stable. These are well circumscribed. 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  Ascending aneurysm- reported as 4.2 cm by radiologist. Looking at the aorta from multiple angles I really think it's more in the 3.9-4 cm range. This does bear follow-up with CT on an annual basis.  Hypertension- blood pressure well controlled on current regimen  Plan:  Return in one year with CT of the chest to follow-up ascending aneurysm and lung nodules Melrose Nakayama, MD [facility] [phone]

## 2017-07-10 ENCOUNTER — Encounter (HOSPITAL_COMMUNITY): Payer: Medicare Other

## 2017-07-10 ENCOUNTER — Other Ambulatory Visit (HOSPITAL_COMMUNITY): Payer: Medicare Other

## 2017-07-10 ENCOUNTER — Ambulatory Visit: Payer: Medicare Other | Admitting: Family

## 2017-08-20 ENCOUNTER — Ambulatory Visit (INDEPENDENT_AMBULATORY_CARE_PROVIDER_SITE_OTHER)
Admission: RE | Admit: 2017-08-20 | Discharge: 2017-08-20 | Disposition: A | Discharge status: Home / Self Care | Payer: Medicare Other | Source: Ambulatory Visit | Attending: Family | Admitting: Family

## 2017-08-20 ENCOUNTER — Encounter: Payer: Self-pay | Admitting: Family

## 2017-08-20 ENCOUNTER — Ambulatory Visit (HOSPITAL_COMMUNITY)
Admission: RE | Admit: 2017-08-20 | Discharge: 2017-08-20 | Disposition: A | Discharge status: Home / Self Care | Payer: Medicare Other | Source: Ambulatory Visit | Attending: Family | Admitting: Family

## 2017-08-20 ENCOUNTER — Ambulatory Visit: Payer: Medicare Other | Admitting: Family

## 2017-08-20 VITALS — BP 129/77 | HR 65 | Resp 20 | Ht 69.0 in | Wt 203.0 lb

## 2017-08-20 DIAGNOSIS — I714 Abdominal aortic aneurysm, without rupture, unspecified: Secondary | ICD-10-CM

## 2017-08-20 DIAGNOSIS — I70201 Unspecified atherosclerosis of native arteries of extremities, right leg: Secondary | ICD-10-CM | POA: Diagnosis not present

## 2017-08-20 DIAGNOSIS — Z95828 Presence of other vascular implants and grafts: Secondary | ICD-10-CM

## 2017-08-20 DIAGNOSIS — I10 Essential (primary) hypertension: Secondary | ICD-10-CM | POA: Diagnosis not present

## 2017-08-20 NOTE — Patient Instructions (Signed)

## 2017-08-20 NOTE — Progress Notes (Signed)
[facility] [place]  CC: Follow up s/p Endovascular Repair of Abdominal Aortic Aneurysm    History of Present Illness  [name] [name] is a 72 y.o. (02/04/1946) male who is s/p aortic stent graft of infrarenal abdominal aortic aneurysm on 04-29-13 by Dr. Kellie Simmering. He has moderate narrowing of his right common femoral artery which has been followed since the stent graft was inserted. He remains asymptomatic.   He had severe intermittent back pain prior to the stent graft surgery, but none since then.  He was found to have a small bilobed mass in the right lower lobe which is followed by Dr. Merilynn Finland.  He does have some chronic cough on occasion but no wheezing or productive cough or hemoptysis; states his chronic cough started after he started taking lisinopril; the lisinopril was stopped but he still has the chronic cough, he is taking losartan instead.   Dr. Kellie Simmering last evaluated pt on 06-21-15. At that time duplex scan of the right femoral artery showed continued elevated velocity in the right common femoral artery at 357 cm/s consistent with a moderate stenosis. ABIs were normal with triphasic flow in the right posterior tibial and dorsalis pedis arteries CT angiogram of the abdomen and pelvis demonstrated the aortic stent graft was in excellent position. There was no evidence of endoleak or migration of the graft. The sac continued to be about 4.6 cm in diameter surrounding the graft.  Pt denies any claudication symptoms with walking. He walks a mile every morning.   He denies any known history of stroke or TIA. He denies any known cardiac disease.  Pt Diabetic: Yes, states his last A1C result was 6.? Pt smoker: former smoker, quit about 1983, started at age 72 years   Past Medical History:  Diagnosis Date  . AAA (abdominal aortic aneurysm) ([place])   . Arthritis    Gout  . Back pain   . Diabetes mellitus without complication ([place])   . GERD  (gastroesophageal reflux disease)   . Hyperlipidemia   . Hypertension   . Lung nodule, solitary 2008   RLL, 8 mm in 2008, 14 mm in 2014  . Reflux    Past Surgical History:  Procedure Laterality Date  . ABDOMINAL AORTIC ENDOVASCULAR STENT GRAFT N/A 04/29/2013   Procedure: ABDOMINAL AORTIC ENDOVASCULAR STENT GRAFT -GORE;  Surgeon: Mal Misty, MD;  Location: [facility];  Service: Vascular;  Laterality: N/A;  . COLONOSCOPY  2010  . VASECTOMY     Social History Social History   Tobacco Use  . Smoking status: Former Smoker    Packs/day: 1.00    Years: 20.00    Pack years: 20.00    Types: Cigarettes    Last attempt to quit: 07/03/1983    Years since quitting: 34.1  . Smokeless tobacco: Never Used  Substance Use Topics  . Alcohol use: Yes    Alcohol/week: 7.2 oz    Types: 12 Cans of beer per week  . Drug use: No   Family History Family History  Problem Relation Age of Onset  . Diabetes Father   . Stroke Father   . Heart attack Father   . Heart attack Brother        X's 2  . Coronary artery disease Mother   . Diabetes Mother   . Heart attack Mother   . Diabetes Sister    Current Outpatient Medications on File Prior to Visit  Medication Sig Dispense Refill  . allopurinol (ZYLOPRIM) 100  MG tablet Take 200 mg by mouth daily.     . citalopram (CELEXA) 10 MG tablet TK 1 T PO D FOR MOOD    . furosemide (LASIX) 40 MG tablet Take 40 mg by mouth daily.    Marland Kitchen glimepiride (AMARYL) 1 MG tablet Take 1 mg by mouth daily with breakfast.     . HYDROcodone-acetaminophen (NORCO/VICODIN) 5-325 MG per tablet Take 1 tablet by mouth every 4 (four) hours as needed for pain (for gout). 20 tablet 0  . metoprolol succinate (TOPROL-XL) 50 MG 24 hr tablet Take 50 mg by mouth daily. Take with or immediately following a meal.    . omeprazole (PRILOSEC) 20 MG capsule Take 20 mg by mouth daily.    . rosuvastatin (CRESTOR) 20 MG tablet Take 10 mg by mouth daily.     . tamsulosin (FLOMAX) 0.4 MG CAPS capsule  Take 0.4 mg by mouth daily.     No current facility-administered medications on file prior to visit.    No Known Allergies   ROS: See HPI for pertinent positives and negatives.  Physical Examination  Vitals:   08/20/17 1111  BP: 129/77  Pulse: 65  Resp: 20  SpO2: 98%  Weight: 203 lb (92.1 kg)  Height: 5\' 9"  (1.753 m)   Body mass index is 29.98 kg/m.  General:A&O x 3, WD, male HEENT: WNL, no gross abnormalities  Pulmonary: Sym exp, respirations are non labored, good air movt, CTAB, no rales, rhonchi, or wheezing. Cardiac: RRR, Nl S1, S2, no murmur appreciated  Vascular: Vessel Right Left  Radial 2+Palpable 2+Palpable  Carotid  without bruit  without bruit  Aorta Not palpable N/A  Femoral Not Palpable 1+Palpable  Popliteal Not palpable 1+ palpable  PT Not Palpable 2+Palpable  DP 2+Palpable 2+Palpable   Gastrointestinal: soft, NTND, -G/R, - HSM, - palpable masses, - CVAT B. Musculoskeletal: M/S 5/5 throughout, extremities without ischemic changes. Neurologic: Pain and light touch intact in extremities, Motor exam as listed above. CN 2-12 intact Skin: No rashes, no ulcers, no cellulitis.   Psychiatric: Normal thought content, mood appropriate for clinical situation.     DATA  EVAR Duplex (Date: 08/20/17):  AAA sac size: 4.2 cm; Right CIA: 1.2 cm; Left CIA: 1.1 cm.   no endoleak detected  Previous: (06-22-16) 4.5 cm; right CIA: 1.3 cm; left CIA: 1.5 cm  Slight decrease in sac size.   Right LE Arterial Duplex (08/20/17): A focal velocity was obtained at mid CFA (465 cm/s) (was 343 cm/ s on 06-22-16), c/w 75-99% stenosis.    ABI (Date: 08/20/2017):  R:   ABI: 1.09 (was 1.04 on 06-22-16),   PT: waveform morphology not documented (was triphasic)  DP: waveform morphology not documented (was triphasic)   TBI:  0.82 (was 0.85)  L:   ABI: 1.22 (was 1.08),   PT: waveform morphology not documented (was triphasic)  DP: waveform morphology not  documented (was triphasic)   TBI: 0.88 (was 0.94)  Bilateral ABI remain stable and normal.    Medical Decision Making  [name] [name] is a 72 y.o. male who is s/p aortic stent graft of infrarenal abdominal aortic aneurysm on 04-29-13 by Dr. Kellie Simmering. He has signficant narrowing of his right common femoral artery which has been followed since the stent graft was inserted.  He remains asymptomatic, no claudication symptoms, he walks a mile daily.    I discussed with Dr. Donnetta Hutching pt lack of claudication sx's and 465 cm/s stenosis at the right mid CFA, normal bilateral  ABI's; only an endarterectomy could address this, and since pt is asymptomatic, will continue to monitor in a year. I advised pt to notify us if he develops right calf pain with walking, and we will see him sooner with ABI's.    His atherosclerotic risk factors include controlled DM and former smoker.  He had severe intermittent back pain prior to the stent graft surgery, but none since then.  I discussed with the patient the importance of surveillance of the endograft.  The next endograft duplex will be scheduled for 12 months. Will also continue checking ABI's and right LE arterial duplex.    I emphasized the importance of maximal medical management including strict control of blood pressure, blood glucose, and lipid levels, antiplatelet agents, obtaining regular exercise, and cessation of smoking.   Thank you for allowing [place] to participate in this patient's care.  Clemon Chambers, RN, MSN, FNP-C [facility] [place] Office: [place]: Early  08/20/2017, 11:31 AM

## 2017-12-19 ENCOUNTER — Other Ambulatory Visit: Payer: Self-pay | Admitting: Thoracic Surgery (Cardiothoracic Vascular Surgery)

## 2017-12-19 DIAGNOSIS — I714 Abdominal aortic aneurysm, without rupture, unspecified: Secondary | ICD-10-CM

## 2018-01-28 ENCOUNTER — Ambulatory Visit
Admission: RE | Admit: 2018-01-28 | Discharge: 2018-01-28 | Disposition: A | Discharge status: Home / Self Care | Payer: Medicare Other | Source: Ambulatory Visit | Attending: Thoracic Surgery (Cardiothoracic Vascular Surgery) | Admitting: Thoracic Surgery (Cardiothoracic Vascular Surgery)

## 2018-01-28 ENCOUNTER — Other Ambulatory Visit: Payer: Self-pay

## 2018-01-28 ENCOUNTER — Ambulatory Visit: Payer: Medicare Other | Admitting: Thoracic Surgery (Cardiothoracic Vascular Surgery)

## 2018-01-28 ENCOUNTER — Encounter: Payer: Self-pay | Admitting: Thoracic Surgery (Cardiothoracic Vascular Surgery)

## 2018-01-28 VITALS — BP 122/72 | HR 67 | Resp 16 | Ht 69.0 in | Wt 200.2 lb

## 2018-01-28 DIAGNOSIS — I712 Thoracic aortic aneurysm, without rupture: Secondary | ICD-10-CM

## 2018-01-28 DIAGNOSIS — D381 Neoplasm of uncertain behavior of trachea, bronchus and lung: Secondary | ICD-10-CM | POA: Diagnosis not present

## 2018-01-28 DIAGNOSIS — I714 Abdominal aortic aneurysm, without rupture, unspecified: Secondary | ICD-10-CM

## 2018-01-28 DIAGNOSIS — I7121 Aneurysm of the ascending aorta, without rupture: Secondary | ICD-10-CM

## 2018-01-28 IMAGING — CT CT CHEST W/O CM
2 of 4 series · 15 of 36 positions shown, 18 images · non-contrast
Comparison: [DATE]

CLINICAL DATA: Lung nodule.

EXAM:
CT CHEST WITHOUT CONTRAST
TECHNIQUE: Multidetector CT imaging of the chest was performed following the
standard protocol without IV contrast.

[Series 2: chest 2.00 br40 s3 ax · axial · 0.76mm/px · z∈[+1476,+1744]mm · 12 of 160 slices shown, 15 images]
[im 13/160  mediastinal]
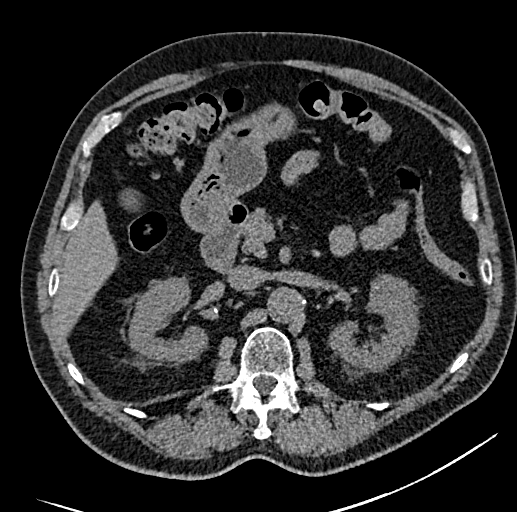
[im 13/160  lung]
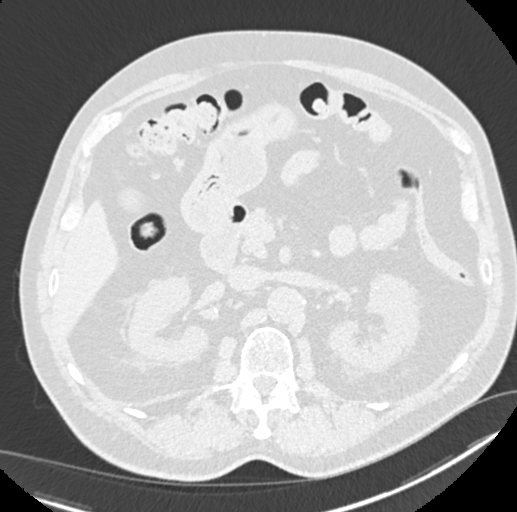
[im 25/160  lung]
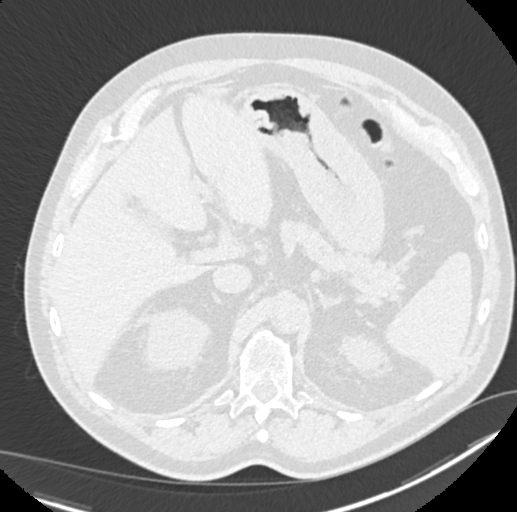
[im 37/160  lung]
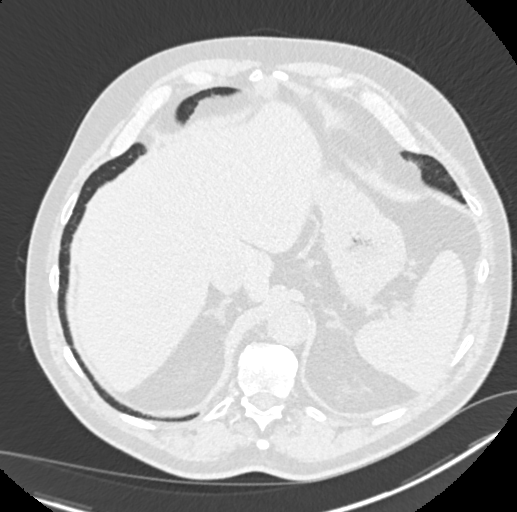
[im 49/160  lung]
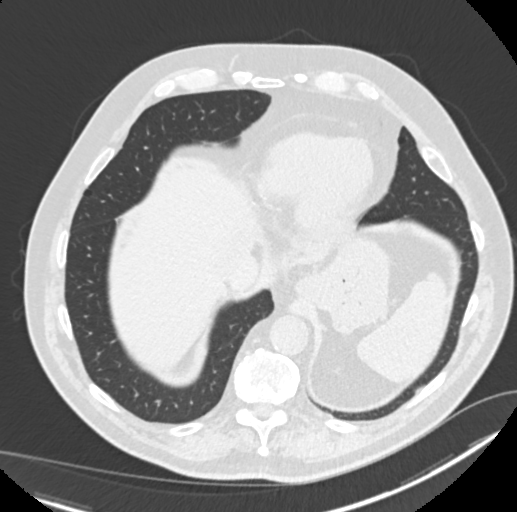
[im 62/160  mediastinal]
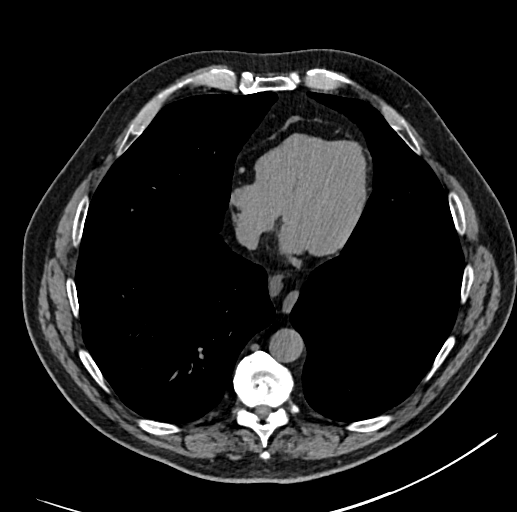
[im 62/160  lung]
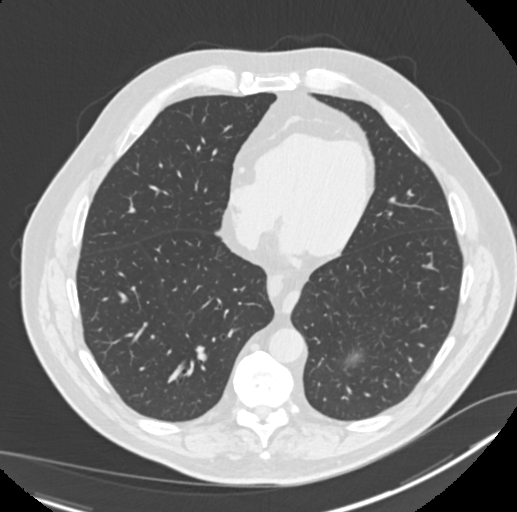
[im 74/160  lung]
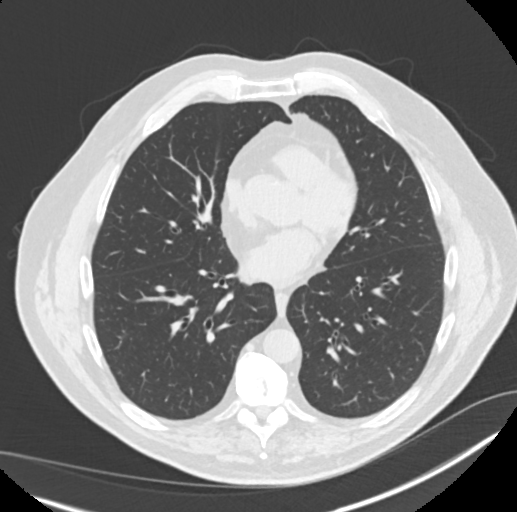
[im 86/160  lung]
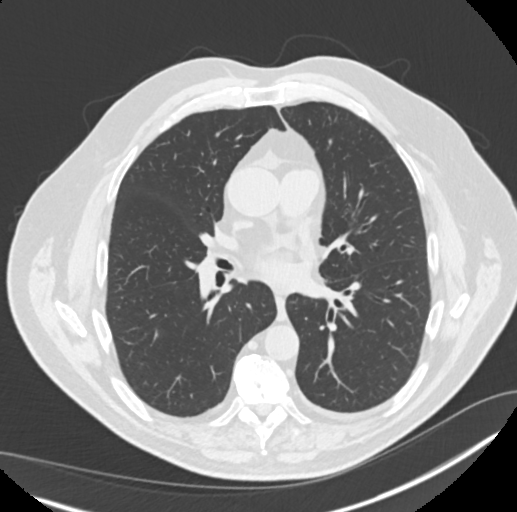
[im 98/160  lung]
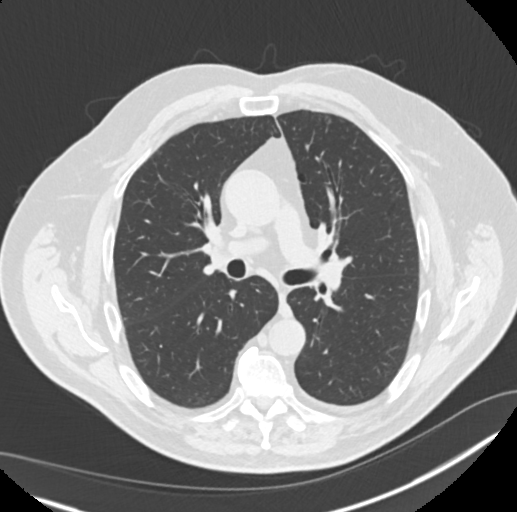
[im 111/160  mediastinal]
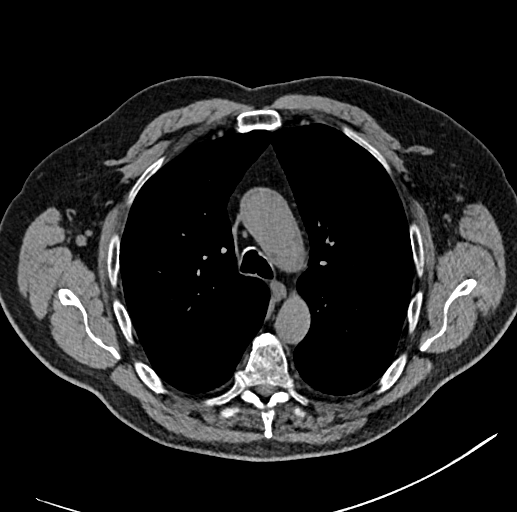
[im 111/160  lung]
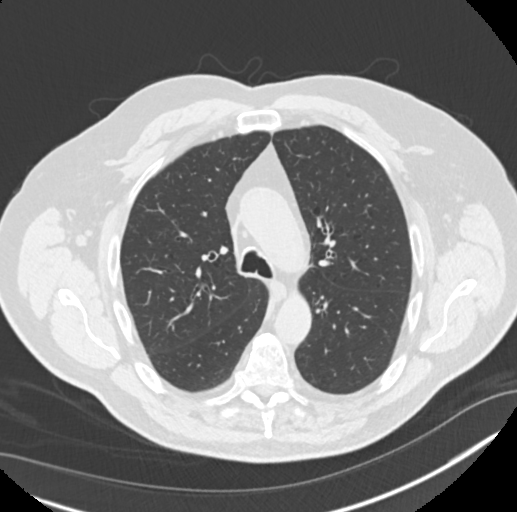
[im 123/160  lung]
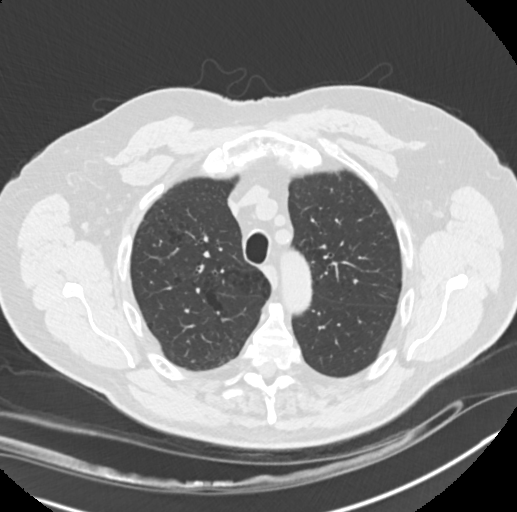
[im 135/160  lung]
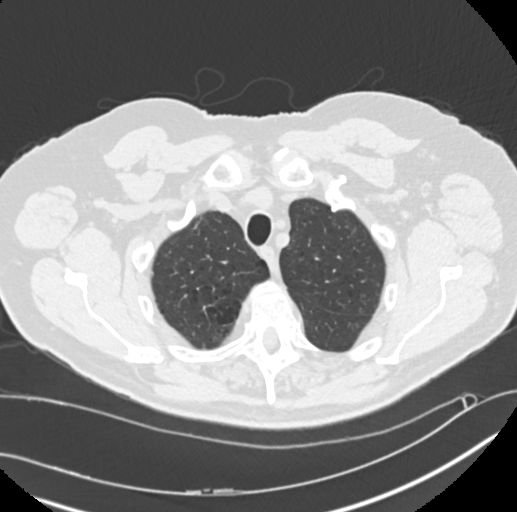
[im 147/160  lung]
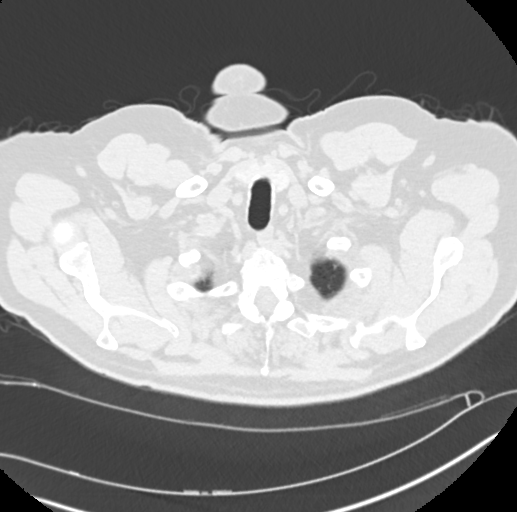

[Series 4: chest 2.00 br40 s3 cor · coronal · 0.63mm/px · 3 of 193 slices shown]
[im 39/193  lung]
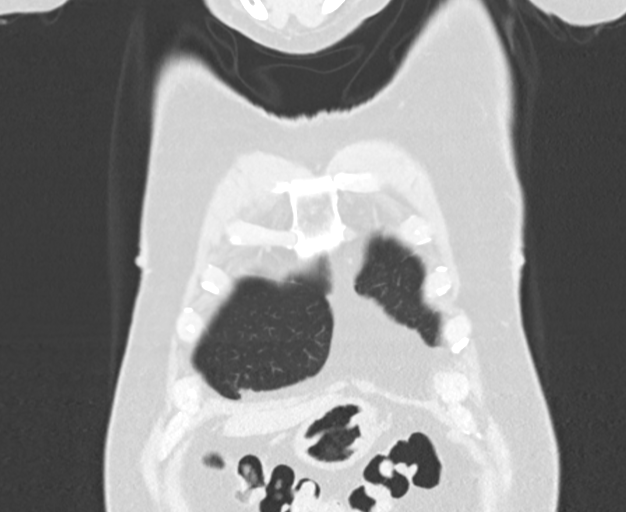
[im 77/193  lung]
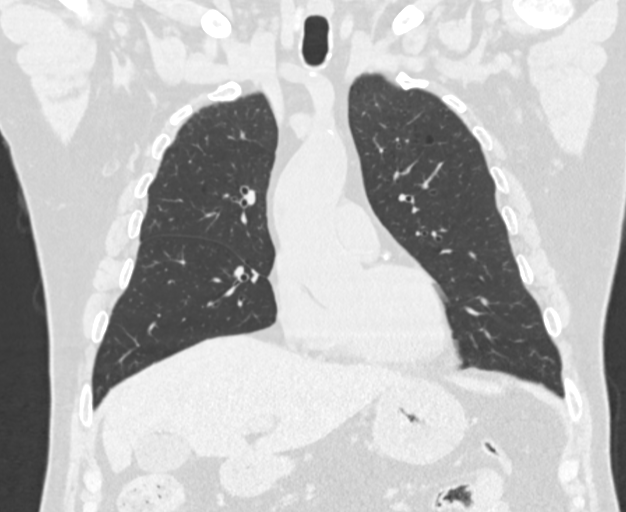
[im 116/193  lung]
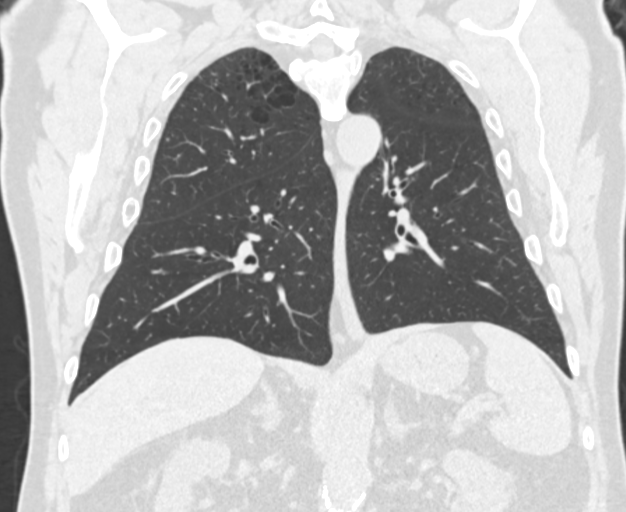

[15 of 36 positions shown; findings below may reference images not displayed]

FINDINGS: Cardiovascular: The heart size is normal. No substantial pericardial
effusion. Coronary artery calcification is evident. Atherosclerotic
calcification is noted in the wall of the thoracic aorta. Ascending
thoracic aorta measures 3.9 cm maximum diameter on today's study.

Mediastinum/Nodes: No mediastinal lymphadenopathy. No evidence for
gross hilar lymphadenopathy although assessment is limited by the
lack of intravenous contrast on today's study. 9 mm short axis
distal paraesophageal lymph node is stable. There is no axillary
lymphadenopathy.

Lungs/Pleura: The central tracheobronchial airways are patent.
Centrilobular emphysema again noted. 6 mm right upper lobe nodule
([DATE]) is unchanged. Right lower lobe pulmonary nodule is similar to
prior measuring 10 x 13 mm today compared 8 x 15 mm previously.

Upper Abdomen: Intra-aortic stent graft incompletely visualized.

Musculoskeletal: No worrisome lytic or sclerotic osseous
abnormality.
IMPRESSION: 1. No interval change in right upper and lower lobe pulmonary
nodules. No new pulmonary nodule or mass.
2.  Emphysema. ([FH]-[FH])
3.  Aortic Atherosclerois ([FH]-170.0)
4. Ascending thoracic aorta measures 3.9 cm maximum diameter on
today's exam.

## 2018-01-28 NOTE — Progress Notes (Signed)
[address]       [place] [address]             [phone]       HPI: [name] [name] returns for a scheduled annual follow-up  [name] [name] is a 72 year old man with a past history of an abdominal aneurysm, arthritis, chronic back pain, type 2 diabetes, hypertension, hyperlipidemia, reflux, 2 right lung nodules, and ascending aortic aneurysm.  He underwent stent grafting of an abdominal aortic aneurysm by Dr. Kellie Simmering in 2014.  As part of his work-up he had a CT of the abdomen and pelvis.  A right lower lobe lung nodule was noted on that scan.  A CT of the chest showed a right upper lobe nodule in addition to the right lower lobe nodule.  He also had a 4 cm ascending aneurysm.  He has been followed annually since that time.  In the year since his last visit he has been doing well.  He denies any chest pain, shortness of breath, excessive fatigue, change in appetite, and weight loss.  Past Medical History:  Diagnosis Date  . AAA (abdominal aortic aneurysm) ([place])   . Arthritis    Gout  . Back pain   . Diabetes mellitus without complication ([place])   . GERD (gastroesophageal reflux disease)   . Hyperlipidemia   . Hypertension   . Lung nodule, solitary 2008   RLL, 8 mm in 2008, 14 mm in 2014  . Reflux    Past Surgical History:  Procedure Laterality Date  . ABDOMINAL AORTIC ENDOVASCULAR STENT GRAFT N/A 04/29/2013   Procedure: ABDOMINAL AORTIC ENDOVASCULAR STENT GRAFT -GORE;  Surgeon: Mal Misty, MD;  Location: [facility];  Service: Vascular;  Laterality: N/A;  . COLONOSCOPY  2010  . VASECTOMY       Current Outpatient Medications  Medication Sig Dispense Refill  . allopurinol (ZYLOPRIM) 100 MG tablet Take 200 mg by mouth daily.     . citalopram (CELEXA) 10 MG tablet TK 1 T PO D FOR MOOD    . furosemide (LASIX) 40 MG tablet Take 40 mg by mouth daily.    Marland Kitchen glimepiride (AMARYL) 1 MG tablet Take 1 mg by mouth daily with breakfast.     .  HYDROcodone-acetaminophen (NORCO/VICODIN) 5-325 MG per tablet Take 1 tablet by mouth every 4 (four) hours as needed for pain (for gout). 20 tablet 0  . metoprolol succinate (TOPROL-XL) 50 MG 24 hr tablet Take 50 mg by mouth daily. Take with or immediately following a meal.    . omeprazole (PRILOSEC) 20 MG capsule Take 20 mg by mouth daily.    . rosuvastatin (CRESTOR) 20 MG tablet Take 10 mg by mouth daily.     . tamsulosin (FLOMAX) 0.4 MG CAPS capsule Take 0.4 mg by mouth daily.     No current facility-administered medications for this visit.     Physical Exam BP 122/72 (BP Location: Right Arm, Patient Position: Sitting, Cuff Size: Large)   Pulse 67   Resp 16   Ht 5\' 9"  (1.753 m)   Wt 200 lb 3.2 oz (90.8 kg)   SpO2 94% Comment: RA  BMI 29.58 kg/m  72 year old man in no acute distress Alert and oriented x3 with no focal deficits Lungs clear with equal breath sounds bilaterally Cardiac regular rate and rhythm normal S1 and S2 No peripheral edema  Diagnostic Tests: CT CHEST WITHOUT CONTRAST  TECHNIQUE: Multidetector CT imaging of the chest was  performed following the standard protocol without IV contrast.  COMPARISON:  01/22/2017  FINDINGS: Cardiovascular: The heart size is normal. No substantial pericardial effusion. Coronary artery calcification is evident. Atherosclerotic calcification is noted in the wall of the thoracic aorta. Ascending thoracic aorta measures 3.9 cm maximum diameter on today's study.  Mediastinum/Nodes: No mediastinal lymphadenopathy. No evidence for gross hilar lymphadenopathy although assessment is limited by the lack of intravenous contrast on today's study. 9 mm short axis distal paraesophageal lymph node is stable. There is no axillary lymphadenopathy.  Lungs/Pleura: The central tracheobronchial airways are patent. Centrilobular emphysema again noted. 6 mm right upper lobe nodule (8:66) is unchanged. Right lower lobe pulmonary nodule is  similar to prior measuring 10 x 13 mm today compared 8 x 15 mm previously.  Upper Abdomen: Intra-aortic stent graft incompletely visualized.  Musculoskeletal: No worrisome lytic or sclerotic osseous abnormality.  IMPRESSION: 1. No interval change in right upper and lower lobe pulmonary nodules. No new pulmonary nodule or mass. 2.  Emphysema. (ICD10-J43.9) 3.  Aortic Atherosclerois (ICD10-170.0) 4. Ascending thoracic aorta measures 3.9 cm maximum diameter on today's exam.   Electronically Signed   By: Misty Stanley M.D.   On: 01/28/2018 12:23 I personally reviewed the CT images and concur with the findings noted above.  Impression: [name] [name] is a 72 year old man with a history of an abdominal aortic aneurysm, hypertension, hyperlipidemia, type 2 diabetes, arthritis, chronic back pain, remote tobacco abuse, who had been following for a borderline ascending aneurysm and right lung nodules since 2014.  Ascending aneurysm-stable at 4 cm.  He understands importance of blood pressure control.  Hypertension-well-controlled on current regimen  Lung nodules-no significant change in size.  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  Plan: Return in 1 year with CT chest  Melrose Nakayama, MD [facility] [phone]

## 2018-08-05 ENCOUNTER — Encounter: Payer: Self-pay | Admitting: *Deleted

## 2018-08-06 ENCOUNTER — Encounter: Payer: Self-pay | Admitting: Diagnostic Neuroimaging

## 2018-08-06 ENCOUNTER — Ambulatory Visit: Payer: Medicare Other | Admitting: Diagnostic Neuroimaging

## 2018-08-06 DIAGNOSIS — R413 Other amnesia: Secondary | ICD-10-CM | POA: Diagnosis not present

## 2018-08-06 NOTE — Progress Notes (Signed)
[facility]  PATIENT: [name] [name] DOB: Sep 18, 1945  REFERRING CLINICIAN: Humphrey Rolls HISTORY FROM: patient and wife  REASON FOR VISIT: new consult    HISTORICAL  CHIEF COMPLAINT:  Chief Complaint  Patient presents with  . Memory change    rm 7, New Pt, wifeVaughan [name]  MMSE 26      HISTORY OF PRESENT ILLNESS:   73 year old male here for evaluation of memory loss, gait difficulty.  For past 1 year patient has had short-term memory loss, confusion, stiffness and gait, shuffling steps.  He is also been diagnosed with severe obstructive sleep apnea but cannot tolerate CPAP.  Patient still able to maintain his ADLs.  Symptoms mainly noted by patient's wife.  CT head and lab testing have been unremarkable.  Patient referred here for further evaluation.   REVIEW OF SYSTEMS: Full 14 system review of systems performed and negative with exception of: Memory loss confusion passing out too much sleep cough wheezing hearing loss ringing in ears.   ALLERGIES: No Known Allergies  HOME MEDICATIONS: Outpatient Medications Prior to Visit  Medication Sig Dispense Refill  . allopurinol (ZYLOPRIM) 100 MG tablet Take 200 mg by mouth daily.     . citalopram (CELEXA) 10 MG tablet TK 1 T PO D FOR MOOD    . furosemide (LASIX) 40 MG tablet Take 40 mg by mouth daily.    Marland Kitchen glimepiride (AMARYL) 1 MG tablet Take 1 mg by mouth daily with breakfast.     . HYDROcodone-acetaminophen (NORCO/VICODIN) 5-325 MG per tablet Take 1 tablet by mouth every 4 (four) hours as needed for pain (for gout). 20 tablet 0  . metoprolol succinate (TOPROL-XL) 50 MG 24 hr tablet Take 50 mg by mouth daily. Take with or immediately following a meal.    . omeprazole (PRILOSEC) 20 MG capsule Take 20 mg by mouth daily.    . rosuvastatin (CRESTOR) 20 MG tablet Take 10 mg by mouth daily.     . tamsulosin (FLOMAX) 0.4 MG CAPS capsule Take 0.4 mg by mouth daily.     No facility-administered medications prior to visit.      PAST MEDICAL HISTORY: Past Medical History:  Diagnosis Date  . AAA (abdominal aortic aneurysm) ([place])   . Arthritis    Gout  . Back pain   . CKD (chronic kidney disease)   . Diabetes mellitus without complication ([place])    type 2  . GERD (gastroesophageal reflux disease)   . Gout   . Hyperlipidemia   . Hypertension   . Lung nodule, solitary 2008   RLL, 8 mm in 2008, 14 mm in 2014  . Memory change   . Polyarthralgia   . Reflux     PAST SURGICAL HISTORY: Past Surgical History:  Procedure Laterality Date  . ABDOMINAL AORTIC ENDOVASCULAR STENT GRAFT N/A 04/29/2013   Procedure: ABDOMINAL AORTIC ENDOVASCULAR STENT GRAFT -GORE;  Surgeon: Mal Misty, MD;  Location: [facility];  Service: Vascular;  Laterality: N/A;  . COLONOSCOPY  2010  . [place]  . TIBIA FRACTURE SURGERY Left   . VASECTOMY    . WRIST FRACTURE SURGERY Left 1970    FAMILY HISTORY: Family History  Problem Relation Age of Onset  . Diabetes Father   . Stroke Father   . Heart attack Father   . Alcohol abuse Father   . Heart attack Brother        X's 2  . Diabetes Brother   . Hypertension Brother   .  Alcohol abuse Brother   . Coronary artery disease Mother   . Diabetes Mother   . Heart attack Mother   . Hypertension Brother   . Alcohol abuse Brother   . Diabetes Sister   . Dementia Maternal Aunt   . Alcohol abuse Maternal Uncle   . Alcohol abuse Paternal Uncle   . Alcohol abuse Maternal Grandfather   . Alcohol abuse Paternal Grandfather     SOCIAL HISTORY: Social History   Socioeconomic History  . Marital status: Divorced    Spouse name: [name] [name]  . Number of children: 2  . Years of education: Not on file  . Highest education level: Bachelor's degree (e.g., BA, AB, BS)  Occupational History    Comment: retired  Scientific laboratory technician  . Financial resource strain: Not on file  . Food insecurity:    Worry: Not on file    Inability: Not on file  . Transportation needs:     Medical: Not on file    Non-medical: Not on file  Tobacco Use  . Smoking status: Former Smoker    Packs/day: 1.00    Years: 20.00    Pack years: 20.00    Types: Cigarettes    Last attempt to quit: 07/03/1983    Years since quitting: 35.1  . Smokeless tobacco: Never Used  Substance and Sexual Activity  . Alcohol use: Yes    Alcohol/week: 12.0 standard drinks    Types: 12 Cans of beer per week    Comment: 08/06/18 18 pk/week  . Drug use: No  . Sexual activity: Not on file  Lifestyle  . Physical activity:    Days per week: Not on file    Minutes per session: Not on file  . Stress: Not on file  Relationships  . Social connections:    Talks on phone: Not on file    Gets together: Not on file    Attends religious service: Not on file    Active member of club or organization: Not on file    Attends meetings of clubs or organizations: Not on file    Relationship status: Not on file  . Intimate partner violence:    Fear of current or ex partner: Not on file    Emotionally abused: Not on file    Physically abused: Not on file    Forced sexual activity: Not on file  Other Topics Concern  . Not on file  Social History Narrative   Lives with wife   Caffeine- coffee, 1 cup daily     PHYSICAL EXAM  GENERAL EXAM/CONSTITUTIONAL: Vitals:  Vitals:   08/06/18 1204  BP: (!) 149/80  Pulse: (!) 53  Weight: 201 lb (91.2 kg)  Height: 5\' 9"  (1.753 m)     Body mass index is 29.68 kg/m. Wt Readings from Last 3 Encounters:  08/06/18 201 lb (91.2 kg)  01/28/18 200 lb 3.2 oz (90.8 kg)  08/20/17 203 lb (92.1 kg)     Patient is in no distress; well developed, nourished and groomed; neck is supple  CARDIOVASCULAR:  Examination of carotid arteries is normal; no carotid bruits  Regular rate and rhythm, no murmurs  Examination of peripheral vascular system by observation and palpation is normal  EYES:  Ophthalmoscopic exam of optic discs and posterior segments is normal; no  papilledema or hemorrhages  Visual Acuity Screening   Right eye Left eye Both eyes  Without correction:     With correction: 20/30 20/30   Comments: bifocals  MUSCULOSKELETAL:  Gait, strength, tone, movements noted in Neurologic exam below  NEUROLOGIC: MENTAL STATUS:  MMSE - Mini Mental State Exam 08/06/2018  Orientation to time 5  Orientation to Place 5  Registration 3  Attention/ Calculation 1  Recall 3  Language- name 2 objects 2  Language- repeat 1  Language- follow 3 step command 3  Language- read & follow direction 1  Write a sentence 1  Copy design 1  Total score 26    awake, alert, oriented to person, place and time  recent and remote memory intact  normal attention and concentration  language fluent, comprehension intact, naming intact  fund of knowledge appropriate  CRANIAL NERVE:   2nd - no papilledema on fundoscopic exam  2nd, 3rd, 4th, 6th - pupils equal and reactive to light, visual fields full to confrontation, extraocular muscles intact, no nystagmus  5th - facial sensation symmetric  7th - facial strength symmetric  8th - hearing intact  9th - palate elevates symmetrically, uvula midline  11th - shoulder shrug symmetric  12th - tongue protrusion midline  MOTOR:   NO TREMOR  STIFFNESS AND SLOWNESS IN LUE (PRIOR INJURY / TRAUMA)  normal bulk and tone, full strength in the BUE, BLE  SENSORY:   normal and symmetric to light touch, temperature, vibration  COORDINATION:   finger-nose-finger, fine finger movements normal  REFLEXES:   deep tendon reflexes present and symmetric  GAIT/STATION:   narrow based gait; DECR ARM SWING; FEET POINTED OUTWARD; MILDLY SLOW AND CAREFUL     DIAGNOSTIC DATA (LABS, IMAGING, TESTING) - I reviewed patient records, labs, notes, testing and imaging myself where available.  Lab Results  Component Value Date   WBC 10.0 04/30/2013   HGB 12.4 (L) 04/30/2013   HCT 35.9 (L) 04/30/2013   MCV  89.8 04/30/2013   PLT 111 (L) 04/30/2013      Component Value Date/Time   NA 142 04/30/2013 0453   K 3.1 (L) 04/30/2013 0453   CL 106 04/30/2013 0453   CO2 25 04/30/2013 0453   GLUCOSE 157 (H) 04/30/2013 0453   BUN 10 04/30/2013 0453   CREATININE 0.90 04/30/2013 0453   CALCIUM 8.7 04/30/2013 0453   PROT 8.0 04/24/2013 1104   ALBUMIN 4.3 04/24/2013 1104   AST 20 04/24/2013 1104   ALT 22 04/24/2013 1104   ALKPHOS 137 (H) 04/24/2013 1104   BILITOT 0.6 04/24/2013 1104   GFRNONAA 86 (L) 04/30/2013 0453   GFRAA >90 04/30/2013 0453   No results found for: CHOL, HDL, LDLCALC, LDLDIRECT, TRIG, CHOLHDL No results found for: HGBA1C No results found for: VITAMINB12 No results found for: TSH     ASSESSMENT AND PLAN  73 y.o. year old male here with short-term memory loss, decreased attention, gait and balance difficulty since 2018.  Dx:  1. Memory loss     PLAN:  MEMORY LOSS (MCI vs mild dementia vs vascular vs other cause) - MRI brain - follow up CPAP (severe OSA) - safety / supervision issues reviewed - caregiver resources provided - caution with driving and finances  Orders Placed This Encounter  Procedures  . MR BRAIN WO CONTRAST   Return in about 6 months (around 02/04/2019).    Penni Bombard, MD 07/09/15, 49:44 PM Certified in Neurology, Neurophysiology and Neuroimaging  [facility] [address], [place], [place] [address] [phone]

## 2018-08-07 ENCOUNTER — Telehealth: Payer: Self-pay | Admitting: Diagnostic Neuroimaging

## 2018-08-07 ENCOUNTER — Encounter: Payer: Self-pay | Admitting: Diagnostic Neuroimaging

## 2018-08-07 NOTE — Telephone Encounter (Signed)
[employer] medicare order sent to [facility]. No auth they will reach out to the pt to schedule.  °

## 2019-02-04 ENCOUNTER — Ambulatory Visit: Payer: Medicare Other | Admitting: Diagnostic Neuroimaging

## 2019-02-25 ENCOUNTER — Other Ambulatory Visit: Payer: Self-pay

## 2019-02-25 ENCOUNTER — Ambulatory Visit
Admission: RE | Admit: 2019-02-25 | Discharge: 2019-02-25 | Disposition: A | Discharge status: Home / Self Care | Payer: Medicare Other | Source: Ambulatory Visit | Attending: Diagnostic Neuroimaging | Admitting: Diagnostic Neuroimaging

## 2019-02-25 DIAGNOSIS — R413 Other amnesia: Secondary | ICD-10-CM

## 2019-02-28 ENCOUNTER — Other Ambulatory Visit: Payer: Medicare Other

## 2019-03-18 ENCOUNTER — Telehealth: Payer: Self-pay | Admitting: *Deleted

## 2019-03-18 NOTE — Telephone Encounter (Signed)
Spoke with wife, [name] [name] on [place] and informed her the MRI brain showed atrophy and chronic small vessel ischemic disease. Advised there are no other major findings. Dr Leta Baptist recomends medical management with controlling BP, lipids, healthy diet. She  verbalized understanding, appreciation. Marland Kitchen

## 2019-05-11 ENCOUNTER — Encounter

## 2019-05-11 ENCOUNTER — Ambulatory Visit: Payer: Medicare Other | Admitting: Diagnostic Neuroimaging

## 2019-09-14 DIAGNOSIS — Z8673 Personal history of transient ischemic attack (TIA), and cerebral infarction without residual deficits: Secondary | ICD-10-CM | POA: Diagnosis not present

## 2019-09-14 DIAGNOSIS — R413 Other amnesia: Secondary | ICD-10-CM | POA: Diagnosis not present

## 2019-09-14 DIAGNOSIS — K529 Noninfective gastroenteritis and colitis, unspecified: Secondary | ICD-10-CM | POA: Diagnosis not present

## 2019-09-14 DIAGNOSIS — Z6830 Body mass index (BMI) 30.0-30.9, adult: Secondary | ICD-10-CM | POA: Diagnosis not present

## 2019-09-25 DIAGNOSIS — E119 Type 2 diabetes mellitus without complications: Secondary | ICD-10-CM | POA: Diagnosis not present

## 2019-09-25 DIAGNOSIS — I10 Essential (primary) hypertension: Secondary | ICD-10-CM | POA: Diagnosis not present

## 2019-09-25 DIAGNOSIS — H35033 Hypertensive retinopathy, bilateral: Secondary | ICD-10-CM | POA: Diagnosis not present

## 2019-09-25 DIAGNOSIS — H5203 Hypermetropia, bilateral: Secondary | ICD-10-CM | POA: Diagnosis not present

## 2019-12-05 DIAGNOSIS — R911 Solitary pulmonary nodule: Secondary | ICD-10-CM | POA: Diagnosis not present

## 2019-12-05 DIAGNOSIS — F1290 Cannabis use, unspecified, uncomplicated: Secondary | ICD-10-CM | POA: Diagnosis not present

## 2019-12-05 DIAGNOSIS — I517 Cardiomegaly: Secondary | ICD-10-CM | POA: Diagnosis not present

## 2019-12-05 DIAGNOSIS — G9341 Metabolic encephalopathy: Secondary | ICD-10-CM | POA: Diagnosis not present

## 2019-12-05 DIAGNOSIS — R29702 NIHSS score 2: Secondary | ICD-10-CM | POA: Diagnosis not present

## 2019-12-05 DIAGNOSIS — R404 Transient alteration of awareness: Secondary | ICD-10-CM | POA: Diagnosis not present

## 2019-12-05 DIAGNOSIS — I10 Essential (primary) hypertension: Secondary | ICD-10-CM | POA: Diagnosis not present

## 2019-12-05 DIAGNOSIS — R29818 Other symptoms and signs involving the nervous system: Secondary | ICD-10-CM | POA: Diagnosis not present

## 2019-12-05 DIAGNOSIS — R4182 Altered mental status, unspecified: Secondary | ICD-10-CM | POA: Diagnosis not present

## 2019-12-05 DIAGNOSIS — R410 Disorientation, unspecified: Secondary | ICD-10-CM | POA: Diagnosis not present

## 2019-12-05 DIAGNOSIS — R531 Weakness: Secondary | ICD-10-CM | POA: Diagnosis not present

## 2019-12-05 DIAGNOSIS — F10239 Alcohol dependence with withdrawal, unspecified: Secondary | ICD-10-CM | POA: Diagnosis not present

## 2019-12-05 DIAGNOSIS — E871 Hypo-osmolality and hyponatremia: Secondary | ICD-10-CM | POA: Diagnosis not present

## 2019-12-05 DIAGNOSIS — E119 Type 2 diabetes mellitus without complications: Secondary | ICD-10-CM | POA: Diagnosis not present

## 2019-12-05 DIAGNOSIS — F0391 Unspecified dementia with behavioral disturbance: Secondary | ICD-10-CM | POA: Diagnosis not present

## 2019-12-05 DIAGNOSIS — R456 Violent behavior: Secondary | ICD-10-CM | POA: Diagnosis not present

## 2019-12-05 DIAGNOSIS — K311 Adult hypertrophic pyloric stenosis: Secondary | ICD-10-CM | POA: Diagnosis not present

## 2019-12-06 DIAGNOSIS — G9341 Metabolic encephalopathy: Secondary | ICD-10-CM | POA: Diagnosis not present

## 2019-12-07 DIAGNOSIS — S0990XA Unspecified injury of head, initial encounter: Secondary | ICD-10-CM | POA: Diagnosis not present

## 2019-12-07 DIAGNOSIS — R4182 Altered mental status, unspecified: Secondary | ICD-10-CM | POA: Diagnosis not present

## 2019-12-07 DIAGNOSIS — G9341 Metabolic encephalopathy: Secondary | ICD-10-CM | POA: Diagnosis not present

## 2019-12-14 DIAGNOSIS — G9340 Encephalopathy, unspecified: Secondary | ICD-10-CM | POA: Diagnosis not present

## 2019-12-14 DIAGNOSIS — Z8673 Personal history of transient ischemic attack (TIA), and cerebral infarction without residual deficits: Secondary | ICD-10-CM | POA: Diagnosis not present

## 2019-12-14 DIAGNOSIS — Z6828 Body mass index (BMI) 28.0-28.9, adult: Secondary | ICD-10-CM | POA: Diagnosis not present

## 2019-12-14 DIAGNOSIS — M109 Gout, unspecified: Secondary | ICD-10-CM | POA: Diagnosis not present

## 2019-12-14 DIAGNOSIS — F39 Unspecified mood [affective] disorder: Secondary | ICD-10-CM | POA: Diagnosis not present

## 2019-12-23 ENCOUNTER — Encounter: Payer: Self-pay | Admitting: *Deleted

## 2019-12-28 ENCOUNTER — Encounter: Payer: Self-pay | Admitting: Diagnostic Neuroimaging

## 2019-12-28 ENCOUNTER — Ambulatory Visit: Payer: Medicare PPO | Admitting: Diagnostic Neuroimaging

## 2019-12-28 ENCOUNTER — Other Ambulatory Visit: Payer: Self-pay

## 2019-12-28 VITALS — BP 128/80 | HR 80 | Ht 69.0 in | Wt 190.8 lb

## 2019-12-28 DIAGNOSIS — R413 Other amnesia: Secondary | ICD-10-CM | POA: Diagnosis not present

## 2019-12-28 NOTE — Progress Notes (Signed)
[facility]  PATIENT: [name] [name] DOB: Dec 10, 1945  REFERRING CLINICIAN: Humphrey [name] HISTORY FROM: patient and wife  REASON FOR VISIT: new consult    HISTORICAL  CHIEF COMPLAINT:  Chief Complaint  Patient presents with  . Acute encephalopathy    rm 6, wife - [name] [name]    HISTORY OF PRESENT ILLNESS:   74 year old male here for evaluation of memory loss, gait difficulty.  For past 1 year patient has had short-term memory loss, confusion, stiffness and gait, shuffling steps.  He is also been diagnosed with severe obstructive sleep apnea but cannot tolerate CPAP.  Patient still able to maintain his ADLs.  Symptoms mainly noted by patient's wife.  CT head and lab testing have been unremarkable.  Patient referred here for further evaluation.  12/05/2019 patient was found by wife after he had apparently fallen down to the bathroom.  His wife helped him to his bedroom.  At 9:00 when he woke up he continued to have confusion and slurred speech.  Patient was taken to the hospital for further evaluation.  On arrival blood pressure was 191/84.  UDS was positive for opiates and marijuana.  Possibility of alcohol withdrawal seizure was raised.  Within a few days he returned to baseline.  Since then patient continues to have intermittent memory lapse, balance difficulty, confusion.  He has cut down on drinking alcohol.  Previously was drinking up to 3 drinks per day consistently.  Now drinks 1 or 2 drinks every few days.   REVIEW OF SYSTEMS: Full 14 system review of systems performed and negative with exception of: Memory loss confusion passing out too much sleep cough wheezing hearing loss ringing in ears.   ALLERGIES: No Known Allergies  HOME MEDICATIONS: Outpatient Medications Prior to Visit  Medication Sig Dispense Refill  . acetaminophen (TYLENOL) 500 MG tablet Take 500 mg by mouth every 6 (six) hours as needed.    Marland Kitchen allopurinol (ZYLOPRIM) 100 MG tablet Take 200 mg by mouth  daily.     . citalopram (CELEXA) 10 MG tablet TK 1 T PO D FOR MOOD    . clopidogrel (PLAVIX) 75 MG tablet 75 mg daily.    . colestipol (COLESTID) 1 g tablet     . donepezil (ARICEPT) 10 MG tablet 10 mg daily.    . furosemide (LASIX) 40 MG tablet Take 40 mg by mouth daily.    Marland Kitchen glimepiride (AMARYL) 1 MG tablet Take 1 mg by mouth daily with breakfast.    . memantine (NAMENDA) 10 MG tablet 10 mg daily.    . metoprolol succinate (TOPROL-XL) 50 MG 24 hr tablet Take 50 mg by mouth daily. Take with or immediately following a meal.    . omeprazole (PRILOSEC) 20 MG capsule Take 20 mg by mouth daily.    . rosuvastatin (CRESTOR) 20 MG tablet Take 10 mg by mouth daily.     . tamsulosin (FLOMAX) 0.4 MG CAPS capsule Take 0.4 mg by mouth daily.    Marland Kitchen glimepiride (AMARYL) 1 MG tablet Take 1 mg by mouth daily with breakfast.     . HYDROcodone-acetaminophen (NORCO/VICODIN) 5-325 MG per tablet Take 1 tablet by mouth every 4 (four) hours as needed for pain (for gout). 20 tablet 0   No facility-administered medications prior to visit.    PAST MEDICAL HISTORY: Past Medical History:  Diagnosis Date  . AAA (abdominal aortic aneurysm) ([place])   . Arthritis    Gout  . Back pain   . CKD (chronic kidney disease)   .  Diabetes mellitus without complication ([place])    type 2  . Encephalopathy acute   . GERD (gastroesophageal reflux disease)   . Gout   . Gout   . Hyperlipidemia   . Hypertension   . Lung nodule, solitary 2008   RLL, 8 mm in 2008, 14 mm in 2014  . Memory change   . Polyarthralgia   . Reflux   . 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 ABDOMINAL AORTIC ENDOVASCULAR STENT GRAFT N/A 04/29/2013   Procedure: ABDOMINAL AORTIC ENDOVASCULAR STENT GRAFT -GORE;  Surgeon: Mal Misty, MD;  Location: [facility];  Service: Vascular;  Laterality: N/A;  . COLONOSCOPY  2010  . [place]  . TIBIA FRACTURE SURGERY Left    . VASECTOMY    . WRIST FRACTURE SURGERY Left 1970    FAMILY HISTORY: Family History  Problem Relation Age of Onset  . Diabetes Father   . Stroke Father   . Heart attack Father   . Alcohol abuse Father   . Heart attack Brother        X's 2  . Diabetes Brother   . Hypertension Brother   . Alcohol abuse Brother   . Coronary artery disease Mother   . Diabetes Mother   . Heart attack Mother   . Hypertension Brother   . Alcohol abuse Brother   . Diabetes Sister   . Dementia Maternal Aunt   . Alcohol abuse Maternal Uncle   . Alcohol abuse Paternal Uncle   . Alcohol abuse Maternal Grandfather   . Alcohol abuse Paternal Grandfather     SOCIAL HISTORY: Social History   Socioeconomic History  . Marital status: Married    Spouse name: [name] [name]  . Number of children: 2  . Years of education: Not on file  . Highest education level: Bachelor's degree (e.g., BA, AB, BS)  Occupational History    Comment: retired  Tobacco Use  . Smoking status: Former Smoker    Packs/day: 1.00    Years: 20.00    Pack years: 20.00    Types: Cigarettes    Quit date: 07/03/1983    Years since quitting: 36.5  . Smokeless tobacco: Never Used  Substance and Sexual Activity  . Alcohol use: Yes    Alcohol/week: 12.0 standard drinks    Types: 12 Cans of beer per week    Comment: 08/06/18 18 pk/week  . Drug use: No  . Sexual activity: Not on file  Other Topics Concern  . Not on file  Social History Narrative   12/23/19 Lives with wife   Caffeine- coffee, 1 cup daily   Social Determinants of Health   Financial Resource Strain:   . Difficulty of Paying Living Expenses:   Food Insecurity:   . Worried About Charity fundraiser in the Last Year:   . Arboriculturist in the Last Year:   Transportation Needs:   . Film/video editor (Medical):   Marland Kitchen Lack of Transportation (Non-Medical):   Physical Activity:   . Days of Exercise per Week:   . Minutes of Exercise per Session:   Stress:   . Feeling  of Stress :   Social Connections:   . Frequency of Communication with Friends and Family:   . Frequency of Social Gatherings with Friends and Family:   . Attends Religious Services:   . Active Member of Clubs  or Organizations:   . Attends Archivist Meetings:   Marland Kitchen Marital Status:   Intimate Partner Violence:   . Fear of Current or Ex-Partner:   . Emotionally Abused:   Marland Kitchen Physically Abused:   . Sexually Abused:      PHYSICAL EXAM  GENERAL EXAM/CONSTITUTIONAL: Vitals:  Vitals:   12/28/19 1553  BP: 128/80  Pulse: 80  Weight: 190 lb 12.8 oz (86.5 kg)  Height: 5\' 9"  (1.753 m)   Body mass index is 28.18 kg/m. Wt Readings from Last 3 Encounters:  12/28/19 190 lb 12.8 oz (86.5 kg)  08/06/18 201 lb (91.2 kg)  01/28/18 200 lb 3.2 oz (90.8 kg)    Patient is in no distress; well developed, nourished and groomed; neck is supple  CARDIOVASCULAR:  Examination of carotid arteries is normal; no carotid bruits  Regular rate and rhythm, no murmurs  Examination of peripheral vascular system by observation and palpation is normal  EYES:  Ophthalmoscopic exam of optic discs and posterior segments is normal; no papilledema or hemorrhages No exam data present  MUSCULOSKELETAL:  Gait, strength, tone, movements noted in Neurologic exam below  NEUROLOGIC: MENTAL STATUS:  MMSE - [place] Exam 08/06/2018  Orientation to time 5  Orientation to Place 5  Registration 3  Attention/ Calculation 1  Recall 3  Language- name 2 objects 2  Language- repeat 1  Language- follow 3 step command 3  Language- read & follow direction 1  Write a sentence 1  Copy design 1  Total score 26    awake, alert, oriented to person, place and time  recent and remote memory intact  normal attention and concentration  language fluent, comprehension intact, naming intact  fund of knowledge appropriate  CRANIAL NERVE:   2nd - no papilledema on fundoscopic exam  2nd, 3rd, 4th,  6th - pupils equal and reactive to light, visual fields full to confrontation, extraocular muscles intact, no nystagmus  5th - facial sensation symmetric  7th - facial strength symmetric  8th - hearing intact  9th - palate elevates symmetrically, uvula midline  11th - shoulder shrug symmetric  12th - tongue protrusion midline  MOTOR:   NO TREMOR  STIFFNESS AND SLOWNESS IN LUE (PRIOR INJURY / TRAUMA)  normal bulk and tone, full strength in the BUE, BLE  SENSORY:   normal and symmetric to light touch, temperature, vibration  COORDINATION:   finger-nose-finger, fine finger movements normal  REFLEXES:   deep tendon reflexes present and symmetric  GAIT/STATION:   narrow based gait; DECR ARM SWING; FEET POINTED OUTWARD; MILDLY SLOW AND CAREFUL     DIAGNOSTIC DATA (LABS, IMAGING, TESTING) - I reviewed patient records, labs, notes, testing and imaging myself where available.  Lab Results  Component Value Date   WBC 10.0 04/30/2013   HGB 12.4 (L) 04/30/2013   HCT 35.9 (L) 04/30/2013   MCV 89.8 04/30/2013   PLT 111 (L) 04/30/2013      Component Value Date/Time   NA 142 04/30/2013 0453   K 3.1 (L) 04/30/2013 0453   CL 106 04/30/2013 0453   CO2 25 04/30/2013 0453   GLUCOSE 157 (H) 04/30/2013 0453   BUN 10 04/30/2013 0453   CREATININE 0.90 04/30/2013 0453   CALCIUM 8.7 04/30/2013 0453   PROT 8.0 04/24/2013 1104   ALBUMIN 4.3 04/24/2013 1104   AST 20 04/24/2013 1104   ALT 22 04/24/2013 1104   ALKPHOS 137 (H) 04/24/2013 1104   BILITOT 0.6 04/24/2013 1104  GFRNONAA 86 (L) 04/30/2013 0453   GFRAA >90 04/30/2013 0453   No results found for: CHOL, HDL, LDLCALC, LDLDIRECT, TRIG, CHOLHDL No results found for: HGBA1C No results found for: VITAMINB12 No results found for: TSH  02/25/19 MRI brain  - MRI scan of the brain showing remote age infarcts involving left caudate head and bilateral corona radiata and moderate changes of generalized cerebral atrophy which  is age disproportionate.   ASSESSMENT AND PLAN  74 y.o. year old male here with short-term memory loss, decreased attention, gait and balance difficulty since 2018.  Dx:  1. Memory loss     PLAN:  CONFUSION / ENCEPHALOPATHY (admitted to hospital; ? post-ictal confusion from seizure; history of etoh abuse, THC abuse, opiates) - check EEG; hold off on anti-seizure meds for now  MEMORY LOSS (mild dementia vs vascular vs other cause) - follow up CPAP treatment (severe OSA) - safety / supervision issues reviewed - caregiver resources provided - caution with driving and finances - caution with alcohol, THC, opioids (recommend to stop using)    Orders Placed This Encounter  Procedures  . EEG adult   Return for pending if symptoms worsen or fail to improve.    Penni Bombard, MD 09/28/1914, 6:06 PM Certified in Neurology, Neurophysiology and Neuroimaging  [facility] [address], [place], [place] [address] [phone]

## 2019-12-28 NOTE — Patient Instructions (Signed)
CONFUSION / ENCEPHALOPATHY (admitted to hospital; post-ictal confusion from seizure) - check EEG  MEMORY LOSS (mild dementia vs vascular vs other cause) - follow up CPAP (severe OSA) - safety / supervision issues reviewed - caregiver resources provided - caution with driving and finances - caution with alcohol, THC, opioids (recommend to stop using)

## 2019-12-29 ENCOUNTER — Encounter: Payer: Self-pay | Admitting: Diagnostic Neuroimaging

## 2020-01-20 ENCOUNTER — Ambulatory Visit: Payer: Medicare PPO | Admitting: Diagnostic Neuroimaging

## 2020-01-20 DIAGNOSIS — R413 Other amnesia: Secondary | ICD-10-CM | POA: Diagnosis not present

## 2020-01-20 NOTE — Procedures (Signed)
   [facility]  EEG (ELECTROENCEPHALOGRAM) REPORT   STUDY DATE: 01/20/20 PATIENT NAME: [name] [name] DOB: [birth year] MRN: [id number]  ORDERING CLINICIAN: Andrey Spearman, MD   TECHNOLOGIST: Arelia Longest  TECHNIQUE: Electroencephalogram was recorded utilizing standard 10-20 system of lead placement and reformatted into average and bipolar montages.  RECORDING TIME: 24 minutes  ACTIVATION: hyperventilation and photic stimulation  CLINICAL INFORMATION: 74 year old male with loss of consciousness and possible seizure.  FINDINGS: Posterior dominant background rhythms, which attenuate with eye opening, ranging 7-9 hertz and 30-40 microvolts. No focal, lateralizing, epileptiform activity or seizures are seen. Patient recorded in the awake and drowsy state. EKG channel shows regular rhythm of 55-60 beats per minute.   IMPRESSION:   This awake and drowsy EEG demonstrates: -Mild generalized slowing consistent with mild encephalopathy. -No epileptiform discharges or seizures seen.    INTERPRETING PHYSICIAN:  Penni Bombard, MD Certified in Neurology, Neurophysiology and Neuroimaging  [facility] [address] [place], [place] [address] [phone]

## 2020-01-21 ENCOUNTER — Telehealth: Payer: Self-pay | Admitting: *Deleted

## 2020-01-21 NOTE — Telephone Encounter (Signed)
Spoke with wife, [name] on [place] and informed her patient's EEG was an unremarkable study. No major findings. I reviewed Dr Sung Amabile  current plan per office notecwith her. She had no questions. I advised to call for any problems, questions. She verbalized understanding, appreciation.

## 2020-02-15 DIAGNOSIS — K5730 Diverticulosis of large intestine without perforation or abscess without bleeding: Secondary | ICD-10-CM | POA: Diagnosis not present

## 2020-02-15 DIAGNOSIS — K2970 Gastritis, unspecified, without bleeding: Secondary | ICD-10-CM | POA: Diagnosis not present

## 2020-03-04 DIAGNOSIS — M940 Chondrocostal junction syndrome [Tietze]: Secondary | ICD-10-CM | POA: Diagnosis not present

## 2020-03-08 ENCOUNTER — Emergency Department (HOSPITAL_COMMUNITY): Payer: Medicare PPO

## 2020-03-08 ENCOUNTER — Observation Stay (HOSPITAL_COMMUNITY): Payer: Medicare PPO

## 2020-03-08 ENCOUNTER — Encounter (HOSPITAL_COMMUNITY): Payer: Self-pay

## 2020-03-08 ENCOUNTER — Other Ambulatory Visit: Payer: Self-pay

## 2020-03-08 ENCOUNTER — Inpatient Hospital Stay (HOSPITAL_COMMUNITY)
Admission: EM | Admit: 2020-03-08 | Discharge: 2020-03-23 | DRG: 037 | Disposition: A | Discharge status: Home / Self Care | Payer: Medicare PPO | Attending: Family Medicine | Admitting: Family Medicine

## 2020-03-08 DIAGNOSIS — R059 Cough, unspecified: Secondary | ICD-10-CM

## 2020-03-08 DIAGNOSIS — Z87891 Personal history of nicotine dependence: Secondary | ICD-10-CM

## 2020-03-08 DIAGNOSIS — R1011 Right upper quadrant pain: Secondary | ICD-10-CM | POA: Diagnosis not present

## 2020-03-08 DIAGNOSIS — I639 Cerebral infarction, unspecified: Principal | ICD-10-CM | POA: Diagnosis present

## 2020-03-08 DIAGNOSIS — R413 Other amnesia: Secondary | ICD-10-CM | POA: Diagnosis not present

## 2020-03-08 DIAGNOSIS — G8929 Other chronic pain: Secondary | ICD-10-CM | POA: Diagnosis present

## 2020-03-08 DIAGNOSIS — I973 Postprocedural hypertension: Secondary | ICD-10-CM | POA: Diagnosis not present

## 2020-03-08 DIAGNOSIS — I6522 Occlusion and stenosis of left carotid artery: Secondary | ICD-10-CM | POA: Diagnosis present

## 2020-03-08 DIAGNOSIS — E119 Type 2 diabetes mellitus without complications: Secondary | ICD-10-CM | POA: Diagnosis not present

## 2020-03-08 DIAGNOSIS — R4701 Aphasia: Secondary | ICD-10-CM | POA: Diagnosis present

## 2020-03-08 DIAGNOSIS — M4802 Spinal stenosis, cervical region: Secondary | ICD-10-CM | POA: Diagnosis not present

## 2020-03-08 DIAGNOSIS — I161 Hypertensive emergency: Secondary | ICD-10-CM | POA: Diagnosis not present

## 2020-03-08 DIAGNOSIS — R05 Cough: Secondary | ICD-10-CM | POA: Diagnosis not present

## 2020-03-08 DIAGNOSIS — I63239 Cerebral infarction due to unspecified occlusion or stenosis of unspecified carotid arteries: Secondary | ICD-10-CM | POA: Diagnosis present

## 2020-03-08 DIAGNOSIS — M1990 Unspecified osteoarthritis, unspecified site: Secondary | ICD-10-CM | POA: Diagnosis present

## 2020-03-08 DIAGNOSIS — Z79899 Other long term (current) drug therapy: Secondary | ICD-10-CM

## 2020-03-08 DIAGNOSIS — Z811 Family history of alcohol abuse and dependence: Secondary | ICD-10-CM

## 2020-03-08 DIAGNOSIS — E161 Other hypoglycemia: Secondary | ICD-10-CM | POA: Diagnosis not present

## 2020-03-08 DIAGNOSIS — R29701 NIHSS score 1: Secondary | ICD-10-CM | POA: Diagnosis not present

## 2020-03-08 DIAGNOSIS — R918 Other nonspecific abnormal finding of lung field: Secondary | ICD-10-CM | POA: Diagnosis not present

## 2020-03-08 DIAGNOSIS — R29702 NIHSS score 2: Secondary | ICD-10-CM | POA: Diagnosis present

## 2020-03-08 DIAGNOSIS — E876 Hypokalemia: Secondary | ICD-10-CM | POA: Diagnosis present

## 2020-03-08 DIAGNOSIS — W06XXXA Fall from bed, initial encounter: Secondary | ICD-10-CM | POA: Diagnosis present

## 2020-03-08 DIAGNOSIS — N289 Disorder of kidney and ureter, unspecified: Secondary | ICD-10-CM | POA: Diagnosis not present

## 2020-03-08 DIAGNOSIS — I517 Cardiomegaly: Secondary | ICD-10-CM | POA: Diagnosis not present

## 2020-03-08 DIAGNOSIS — E162 Hypoglycemia, unspecified: Secondary | ICD-10-CM

## 2020-03-08 DIAGNOSIS — J189 Pneumonia, unspecified organism: Secondary | ICD-10-CM

## 2020-03-08 DIAGNOSIS — R32 Unspecified urinary incontinence: Secondary | ICD-10-CM | POA: Diagnosis present

## 2020-03-08 DIAGNOSIS — G4733 Obstructive sleep apnea (adult) (pediatric): Secondary | ICD-10-CM | POA: Diagnosis present

## 2020-03-08 DIAGNOSIS — I340 Nonrheumatic mitral (valve) insufficiency: Secondary | ICD-10-CM | POA: Diagnosis not present

## 2020-03-08 DIAGNOSIS — F1010 Alcohol abuse, uncomplicated: Secondary | ICD-10-CM | POA: Diagnosis present

## 2020-03-08 DIAGNOSIS — E1122 Type 2 diabetes mellitus with diabetic chronic kidney disease: Secondary | ICD-10-CM | POA: Diagnosis present

## 2020-03-08 DIAGNOSIS — Y838 Other surgical procedures as the cause of abnormal reaction of the patient, or of later complication, without mention of misadventure at the time of the procedure: Secondary | ICD-10-CM | POA: Diagnosis not present

## 2020-03-08 DIAGNOSIS — I480 Paroxysmal atrial fibrillation: Secondary | ICD-10-CM | POA: Diagnosis not present

## 2020-03-08 DIAGNOSIS — D72829 Elevated white blood cell count, unspecified: Secondary | ICD-10-CM | POA: Diagnosis present

## 2020-03-08 DIAGNOSIS — J90 Pleural effusion, not elsewhere classified: Secondary | ICD-10-CM | POA: Diagnosis not present

## 2020-03-08 DIAGNOSIS — Z8679 Personal history of other diseases of the circulatory system: Secondary | ICD-10-CM

## 2020-03-08 DIAGNOSIS — N179 Acute kidney failure, unspecified: Secondary | ICD-10-CM | POA: Diagnosis present

## 2020-03-08 DIAGNOSIS — W19XXXA Unspecified fall, initial encounter: Secondary | ICD-10-CM

## 2020-03-08 DIAGNOSIS — R404 Transient alteration of awareness: Secondary | ICD-10-CM | POA: Diagnosis not present

## 2020-03-08 DIAGNOSIS — M5124 Other intervertebral disc displacement, thoracic region: Secondary | ICD-10-CM | POA: Diagnosis not present

## 2020-03-08 DIAGNOSIS — Z20822 Contact with and (suspected) exposure to covid-19: Secondary | ICD-10-CM | POA: Diagnosis present

## 2020-03-08 DIAGNOSIS — I97638 Postprocedural hematoma of a circulatory system organ or structure following other circulatory system procedure: Secondary | ICD-10-CM | POA: Diagnosis not present

## 2020-03-08 DIAGNOSIS — J9601 Acute respiratory failure with hypoxia: Secondary | ICD-10-CM | POA: Diagnosis not present

## 2020-03-08 DIAGNOSIS — I129 Hypertensive chronic kidney disease with stage 1 through stage 4 chronic kidney disease, or unspecified chronic kidney disease: Secondary | ICD-10-CM | POA: Diagnosis present

## 2020-03-08 DIAGNOSIS — D62 Acute posthemorrhagic anemia: Secondary | ICD-10-CM | POA: Diagnosis not present

## 2020-03-08 DIAGNOSIS — Z833 Family history of diabetes mellitus: Secondary | ICD-10-CM

## 2020-03-08 DIAGNOSIS — F1290 Cannabis use, unspecified, uncomplicated: Secondary | ICD-10-CM | POA: Diagnosis present

## 2020-03-08 DIAGNOSIS — I63232 Cerebral infarction due to unspecified occlusion or stenosis of left carotid arteries: Secondary | ICD-10-CM | POA: Diagnosis not present

## 2020-03-08 DIAGNOSIS — I6523 Occlusion and stenosis of bilateral carotid arteries: Secondary | ICD-10-CM | POA: Diagnosis not present

## 2020-03-08 DIAGNOSIS — S1093XA Contusion of unspecified part of neck, initial encounter: Secondary | ICD-10-CM

## 2020-03-08 DIAGNOSIS — S0990XA Unspecified injury of head, initial encounter: Secondary | ICD-10-CM | POA: Diagnosis not present

## 2020-03-08 DIAGNOSIS — I6501 Occlusion and stenosis of right vertebral artery: Secondary | ICD-10-CM | POA: Diagnosis not present

## 2020-03-08 DIAGNOSIS — E785 Hyperlipidemia, unspecified: Secondary | ICD-10-CM | POA: Diagnosis present

## 2020-03-08 DIAGNOSIS — M47812 Spondylosis without myelopathy or radiculopathy, cervical region: Secondary | ICD-10-CM | POA: Diagnosis not present

## 2020-03-08 DIAGNOSIS — Z823 Family history of stroke: Secondary | ICD-10-CM

## 2020-03-08 DIAGNOSIS — Z8249 Family history of ischemic heart disease and other diseases of the circulatory system: Secondary | ICD-10-CM

## 2020-03-08 DIAGNOSIS — M109 Gout, unspecified: Secondary | ICD-10-CM | POA: Diagnosis present

## 2020-03-08 DIAGNOSIS — Z7984 Long term (current) use of oral hypoglycemic drugs: Secondary | ICD-10-CM

## 2020-03-08 DIAGNOSIS — K219 Gastro-esophageal reflux disease without esophagitis: Secondary | ICD-10-CM | POA: Diagnosis present

## 2020-03-08 DIAGNOSIS — S199XXA Unspecified injury of neck, initial encounter: Secondary | ICD-10-CM | POA: Diagnosis not present

## 2020-03-08 DIAGNOSIS — F0390 Unspecified dementia without behavioral disturbance: Secondary | ICD-10-CM | POA: Diagnosis present

## 2020-03-08 DIAGNOSIS — L7632 Postprocedural hematoma of skin and subcutaneous tissue following other procedure: Secondary | ICD-10-CM | POA: Diagnosis not present

## 2020-03-08 DIAGNOSIS — M50221 Other cervical disc displacement at C4-C5 level: Secondary | ICD-10-CM | POA: Diagnosis not present

## 2020-03-08 DIAGNOSIS — I10 Essential (primary) hypertension: Secondary | ICD-10-CM

## 2020-03-08 DIAGNOSIS — N1831 Chronic kidney disease, stage 3a: Secondary | ICD-10-CM | POA: Diagnosis present

## 2020-03-08 DIAGNOSIS — R410 Disorientation, unspecified: Secondary | ICD-10-CM | POA: Diagnosis not present

## 2020-03-08 DIAGNOSIS — E11649 Type 2 diabetes mellitus with hypoglycemia without coma: Secondary | ICD-10-CM | POA: Diagnosis present

## 2020-03-08 DIAGNOSIS — Z23 Encounter for immunization: Secondary | ICD-10-CM

## 2020-03-08 DIAGNOSIS — I351 Nonrheumatic aortic (valve) insufficiency: Secondary | ICD-10-CM | POA: Diagnosis not present

## 2020-03-08 DIAGNOSIS — Y92003 Bedroom of unspecified non-institutional (private) residence as the place of occurrence of the external cause: Secondary | ICD-10-CM

## 2020-03-08 DIAGNOSIS — N189 Chronic kidney disease, unspecified: Secondary | ICD-10-CM | POA: Diagnosis not present

## 2020-03-08 DIAGNOSIS — Z9119 Patient's noncompliance with other medical treatment and regimen: Secondary | ICD-10-CM

## 2020-03-08 DIAGNOSIS — E860 Dehydration: Secondary | ICD-10-CM | POA: Diagnosis present

## 2020-03-08 DIAGNOSIS — J9600 Acute respiratory failure, unspecified whether with hypoxia or hypercapnia: Secondary | ICD-10-CM

## 2020-03-08 DIAGNOSIS — J45909 Unspecified asthma, uncomplicated: Secondary | ICD-10-CM | POA: Diagnosis not present

## 2020-03-08 DIAGNOSIS — J9811 Atelectasis: Secondary | ICD-10-CM | POA: Diagnosis not present

## 2020-03-08 DIAGNOSIS — Z7902 Long term (current) use of antithrombotics/antiplatelets: Secondary | ICD-10-CM

## 2020-03-08 DIAGNOSIS — J9690 Respiratory failure, unspecified, unspecified whether with hypoxia or hypercapnia: Secondary | ICD-10-CM | POA: Diagnosis not present

## 2020-03-08 HISTORY — DX: Cerebral infarction, unspecified: I63.9

## 2020-03-08 HISTORY — DX: Disorder of kidney and ureter, unspecified: N28.9

## 2020-03-08 HISTORY — DX: Type 2 diabetes mellitus without complications: E11.9

## 2020-03-08 LAB — URINALYSIS, ROUTINE W REFLEX MICROSCOPIC
Bacteria, UA: NONE SEEN
Bilirubin Urine: NEGATIVE
Glucose, UA: NEGATIVE mg/dL
Hgb urine dipstick: NEGATIVE
Ketones, ur: NEGATIVE mg/dL
Leukocytes,Ua: NEGATIVE
Nitrite: NEGATIVE
Protein, ur: 30 mg/dL — AB
Specific Gravity, Urine: 1.034 — ABNORMAL HIGH (ref 1.005–1.030)
pH: 5 (ref 5.0–8.0)

## 2020-03-08 LAB — CBC WITH DIFFERENTIAL/PLATELET
Abs Immature Granulocytes: 0.05 10*3/uL (ref 0.00–0.07)
Basophils Absolute: 0 10*3/uL (ref 0.0–0.1)
Basophils Relative: 0 %
Eosinophils Absolute: 0 10*3/uL (ref 0.0–0.5)
Eosinophils Relative: 0 %
HCT: 41.7 % (ref 39.0–52.0)
Hemoglobin: 14 g/dL (ref 13.0–17.0)
Immature Granulocytes: 0 %
Lymphocytes Relative: 8 %
Lymphs Abs: 1 10*3/uL (ref 0.7–4.0)
MCH: 30.6 pg (ref 26.0–34.0)
MCHC: 33.6 g/dL (ref 30.0–36.0)
MCV: 91 fL (ref 80.0–100.0)
Monocytes Absolute: 1 10*3/uL (ref 0.1–1.0)
Monocytes Relative: 8 %
Neutro Abs: 10.6 10*3/uL — ABNORMAL HIGH (ref 1.7–7.7)
Neutrophils Relative %: 84 %
Platelets: 187 10*3/uL (ref 150–400)
RBC: 4.58 MIL/uL (ref 4.22–5.81)
RDW: 12.9 % (ref 11.5–15.5)
WBC: 12.7 10*3/uL — ABNORMAL HIGH (ref 4.0–10.5)
nRBC: 0 % (ref 0.0–0.2)

## 2020-03-08 LAB — COMPREHENSIVE METABOLIC PANEL
ALT: 30 U/L (ref 0–44)
AST: 39 U/L (ref 15–41)
Albumin: 3.9 g/dL (ref 3.5–5.0)
Alkaline Phosphatase: 120 U/L (ref 38–126)
Anion gap: 10 (ref 5–15)
BUN: 20 mg/dL (ref 8–23)
CO2: 24 mmol/L (ref 22–32)
Calcium: 9.7 mg/dL (ref 8.9–10.3)
Chloride: 102 mmol/L (ref 98–111)
Creatinine, Ser: 1.46 mg/dL — ABNORMAL HIGH (ref 0.61–1.24)
GFR calc Af Amer: 54 mL/min — ABNORMAL LOW (ref >60)
GFR calc non Af Amer: 47 mL/min — ABNORMAL LOW (ref >60)
Glucose, Bld: 191 mg/dL — ABNORMAL HIGH (ref 70–99)
Potassium: 4.2 mmol/L (ref 3.5–5.1)
Sodium: 136 mmol/L (ref 135–145)
Total Bilirubin: 0.7 mg/dL (ref 0.3–1.2)
Total Protein: 6.7 g/dL (ref 6.5–8.1)

## 2020-03-08 LAB — PROTIME-INR
INR: 0.9 (ref 0.8–1.2)
Prothrombin Time: 12.2 seconds (ref 11.4–15.2)

## 2020-03-08 LAB — RAPID URINE DRUG SCREEN, HOSP PERFORMED
Amphetamines: NOT DETECTED
Barbiturates: NOT DETECTED
Benzodiazepines: NOT DETECTED
Cocaine: NOT DETECTED
Opiates: POSITIVE — AB
Tetrahydrocannabinol: POSITIVE — AB

## 2020-03-08 LAB — CBG MONITORING, ED
Glucose-Capillary: 190 mg/dL — ABNORMAL HIGH (ref 70–99)
Glucose-Capillary: 173 mg/dL — ABNORMAL HIGH (ref 70–99)

## 2020-03-08 LAB — SARS CORONAVIRUS 2 BY RT PCR (HOSPITAL ORDER, PERFORMED IN ~~LOC~~ HOSPITAL LAB): SARS Coronavirus 2: NEGATIVE

## 2020-03-08 LAB — LIPASE, BLOOD: Lipase: 28 U/L (ref 11–51)

## 2020-03-08 LAB — ETHANOL: Alcohol, Ethyl (B): <10 mg/dL (ref <10)

## 2020-03-08 IMAGING — MR MR CERVICAL SPINE W/O CM
5 series · 34 of 48 positions shown · non-contrast
Comparison: None available.

CLINICAL DATA: Initial evaluation for acute myelopathy.

EXAM:
MRI CERVICAL AND THORACIC SPINE WITHOUT CONTRAST
TECHNIQUE: Multiplanar and multiecho pulse sequences of the cervical spine, to
include the craniocervical junction and cervicothoracic junction,
and the thoracic spine, were obtained without intravenous contrast.

[Series 1: T2 · sagittal · 3.0mm · 0.69mm/px · 6 of 15 slices shown (1 of 2)]
[im 1/15]
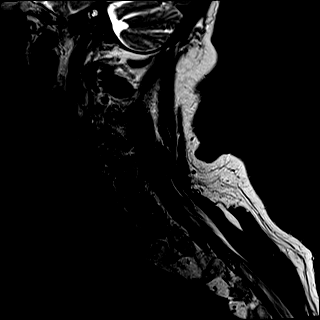
[im 3/15]
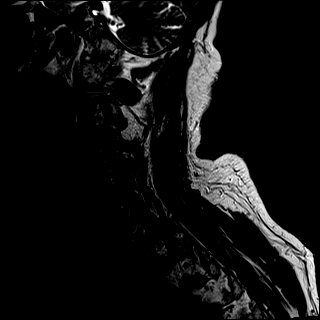
[im 6/15]
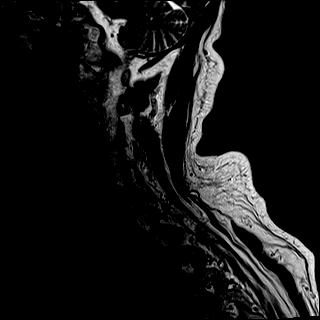
[im 9/15]
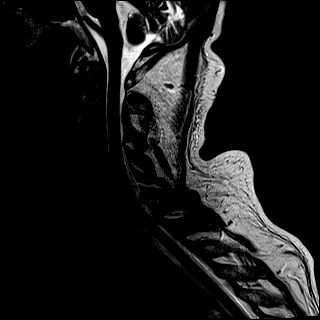
[im 12/15]
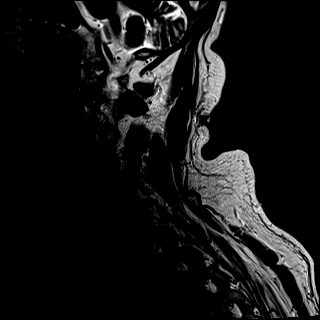
[im 15/15]
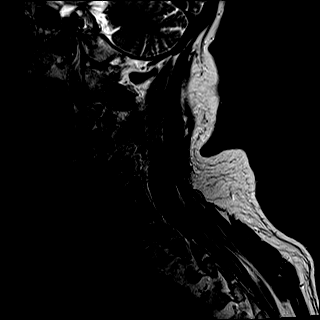

[Series 2: T1 · sagittal · 3.0mm · 0.69mm/px · 6 of 15 slices shown]
[im 1/15]
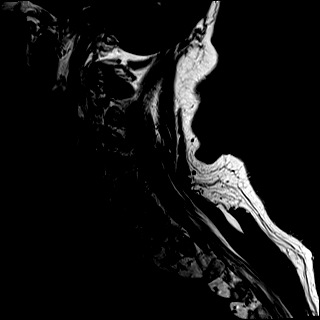
[im 3/15]
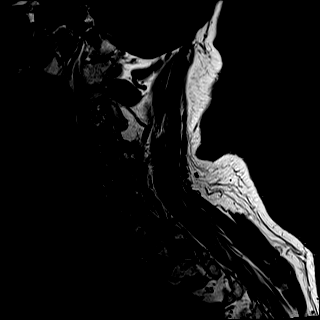
[im 6/15]
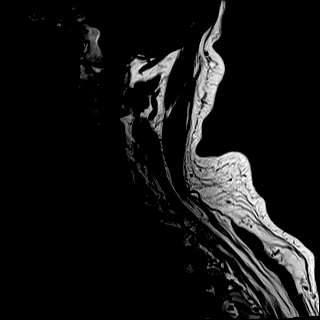
[im 9/15]
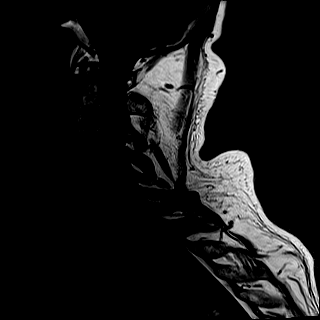
[im 12/15]
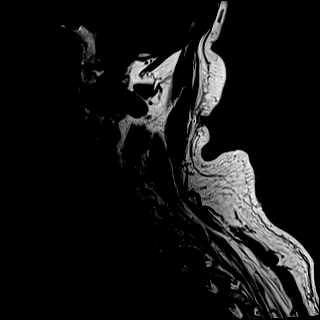
[im 15/15]
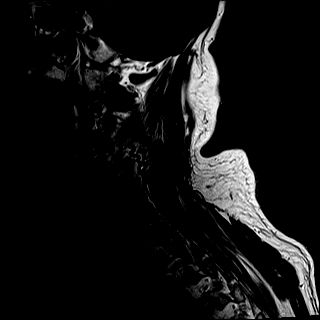

[Series 3: STIR · sagittal · 3.0mm · 0.86mm/px · 6 of 15 slices shown]
[im 1/15]
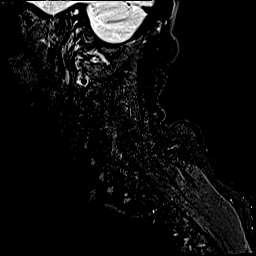
[im 3/15]
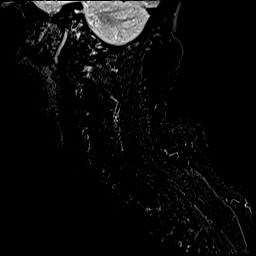
[im 6/15]
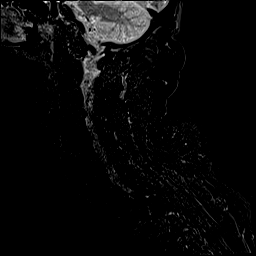
[im 9/15]
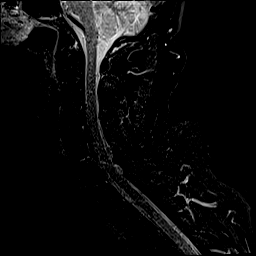
[im 12/15]
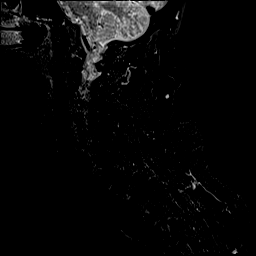
[im 15/15]
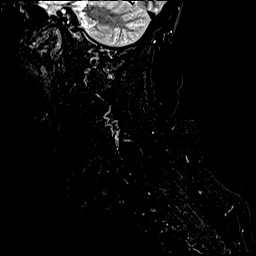

[Series 4: T2 · axial · 3.0mm · 0.66mm/px · z∈[-147,-30]mm · 9 of 40 slices shown (2 of 2)]
[im 1/40]
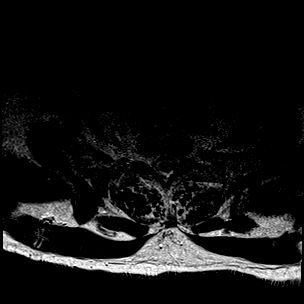
[im 6/40]
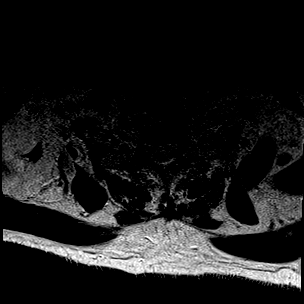
[im 12/40]
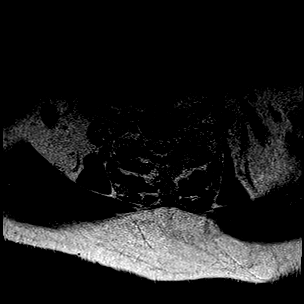
[im 17/40]
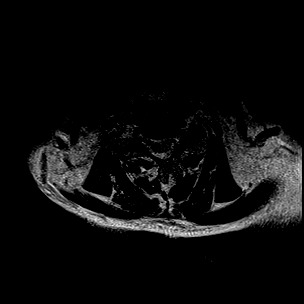
[im 20/40]
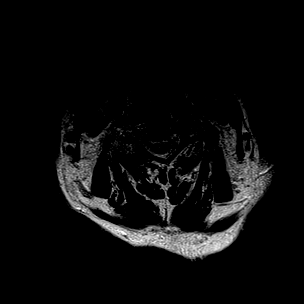
[im 23/40]
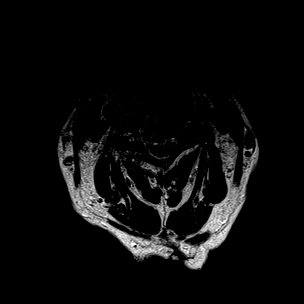
[im 28/40]
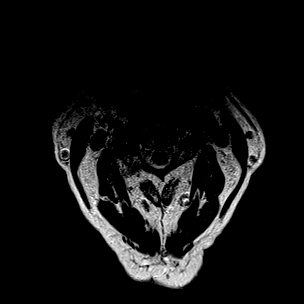
[im 34/40]
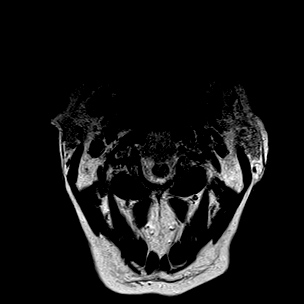
[im 40/40]
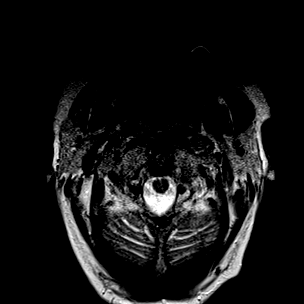

[Series 5: GRE · axial · 3.0mm · 0.35mm/px · z∈[-143,-44]mm · 7 of 40 slices shown]
[im 1/40]
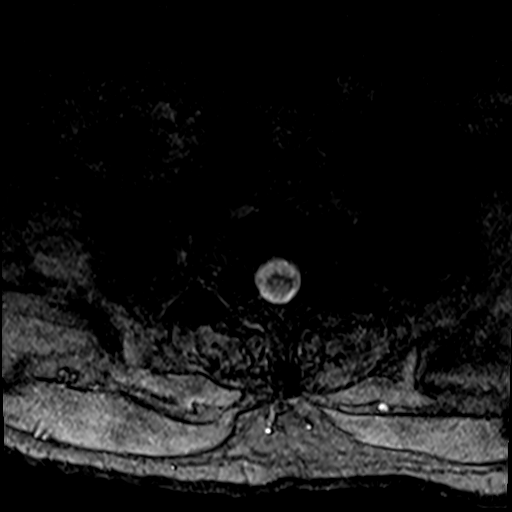
[im 6/40]
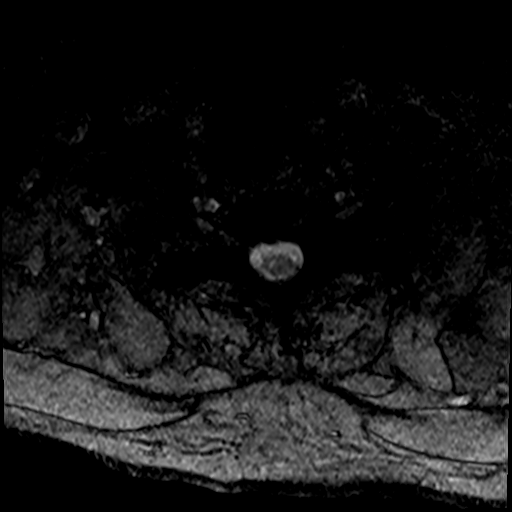
[im 12/40]
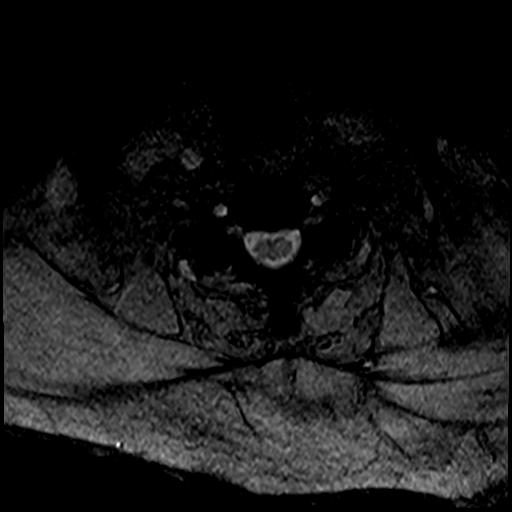
[im 17/40]
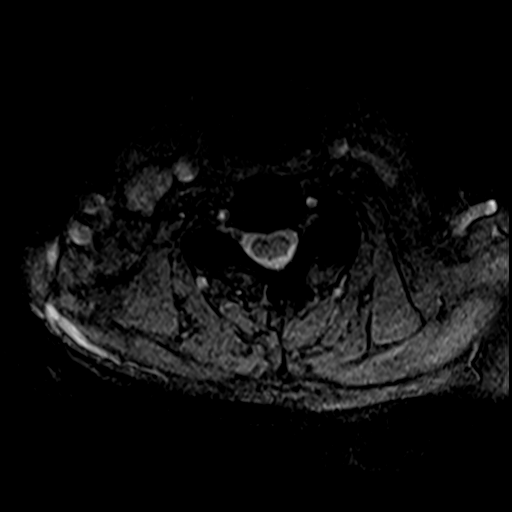
[im 23/40]
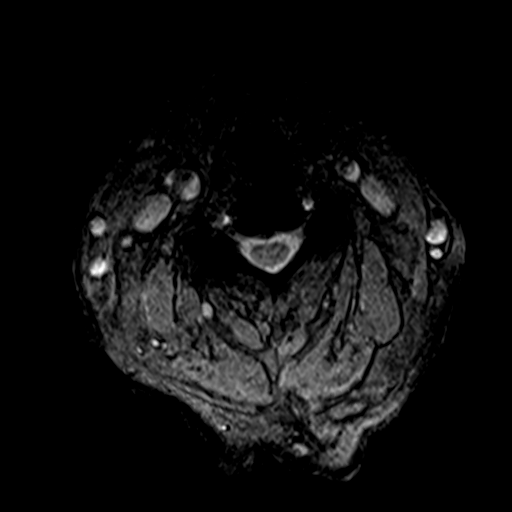
[im 28/40]
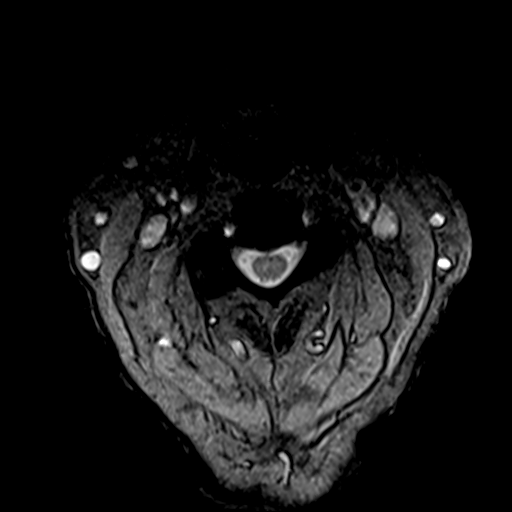
[im 34/40]
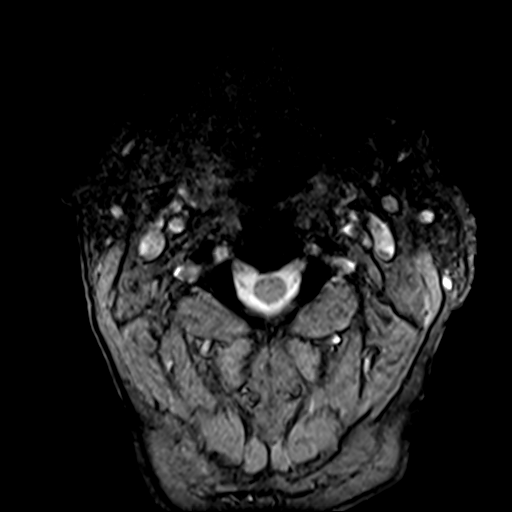

[34 of 48 positions shown; findings below may reference images not displayed]

FINDINGS: MRI CERVICAL SPINE FINDINGS

Examination degraded by motion artifact.

Alignment: Physiologic with preservation of the normal cervical
lordosis. No listhesis.

Vertebrae: Vertebral body height maintained without acute or chronic
fracture. Bone marrow signal intensity within normal limits. No
worrisome osseous lesions. Minimal reactive marrow edema seen about
the left C6-7 facet due to facet arthritis. No other abnormal marrow
edema.

Cord: Normal signal and morphology.

Posterior Fossa, vertebral arteries, paraspinal tissues: Visualized
brain and posterior fossa within normal limits. Craniocervical
junction normal. Paraspinous and prevertebral soft tissues within
normal limits. Normal intravascular flow voids seen within the
vertebral arteries bilaterally.

Disc levels:

C2-C3: Negative interspace. Right greater than left facet
hypertrophy with associated ankylosis on the right. No spinal
stenosis. Foramina remain patent.

C3-C4: Minimal disc bulge with uncovertebral hypertrophy. Moderate
right greater than left facet and ligament flavum hypertrophy. No
significant spinal stenosis. Moderate bilateral C4 foraminal
narrowing.

C4-C5: Mild disc bulge with uncovertebral hypertrophy. Superimposed
small central/right paracentral disc protrusion mildly indents the
ventral thecal sac (series 5, image 20). Moderate right with mild
left facet hypertrophy. No significant spinal stenosis. Moderate
left with mild to moderate right C5 foraminal stenosis.

C5-C6: Degenerative intervertebral disc space narrowing with diffuse
disc bulge and bilateral uncovertebral hypertrophy. Moderate left
with mild right facet degeneration with associated ankylosis on the
left. No significant spinal stenosis. Moderate left with mild right
C6 foraminal narrowing.

C6-C7: Mild annular disc bulge. Mild to moderate bilateral facet
hypertrophy. No significant spinal stenosis. Foramina remain patent.

C7-T1: Minimal disc bulge. Mild facet hypertrophy. No canal or
foraminal stenosis.

MRI THORACIC SPINE FINDINGS

Examination degraded by motion artifact.

Alignment: Mild scoliosis with exaggeration of the normal thoracic
kyphosis. No listhesis.

Vertebrae: Vertebral body height maintained without acute or chronic
fracture. Bone marrow signal intensity within normal limits. Few
scattered benign hemangiomata noted. No worrisome osseous lesions.
No abnormal marrow edema.

Cord: Normal signal and morphology. No convincing cord signal
abnormality on this motion degraded exam. Conus medullaris
terminates at approximately the L1 level.

Paraspinal and other soft tissues: Negative.

Disc levels:

No significant degenerative disc disease seen for patient age. No
disc bulge or focal disc herniation. No significant canal or
foraminal stenosis. No neural impingement.
IMPRESSION: 1. Normal MRI of the cervicothoracic spinal cord. No cord signal
changes to suggest myelopathy or other abnormality.
2. Mild multilevel cervical spondylosis and facet arthrosis without
significant spinal stenosis. Associated mild to moderate bilateral
C4 through C6 foraminal narrowing as above.
3. No significant disc pathology within the thoracic spine for
patient age. No thoracic spinal stenosis or neural impingement.

## 2020-03-08 IMAGING — MR MR HEAD W/O CM
6 of 10 series · 29 of 48 positions shown · non-contrast
Comparison: Concurrent CTA head and neck. [DATE] MRI head and
prior.

CLINICAL DATA: Neuro deficit

EXAM:
MRI HEAD WITHOUT CONTRAST
TECHNIQUE: Multiplanar, multiecho pulse sequences of the brain and surrounding
structures were obtained without intravenous contrast.

[Series 2: DWI · axial · 3.0mm · 0.94mm/px · z∈[-51,+95]mm · 9 of 100 slices shown (1 of 2)]
[im 1/100]
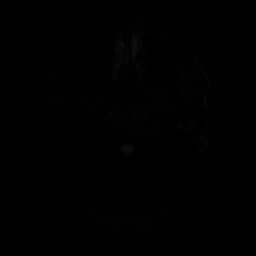
[im 13/100]
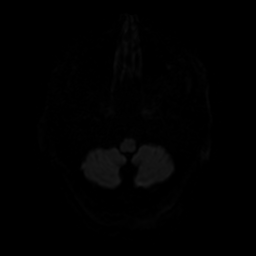
[im 25/100]
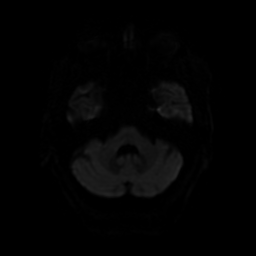
[im 38/100]
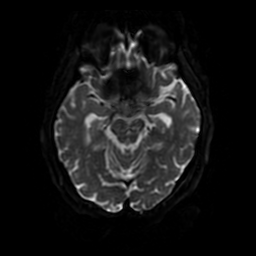
[im 50/100]
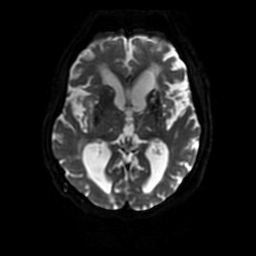
[im 62/100]
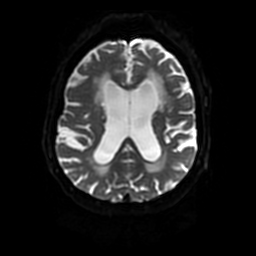
[im 75/100]
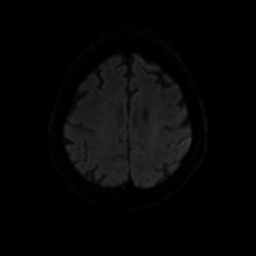
[im 87/100]
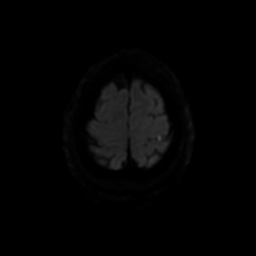
[im 100/100]
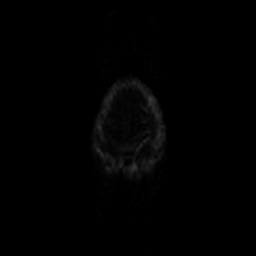

[Series 3: DWI · coronal · 4.0mm · 0.94mm/px · 7 of 74 slices shown (2 of 2)]
[im 1/74]
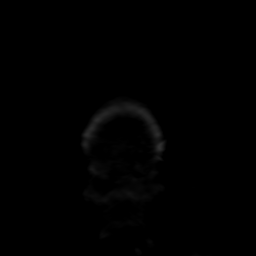
[im 13/74]
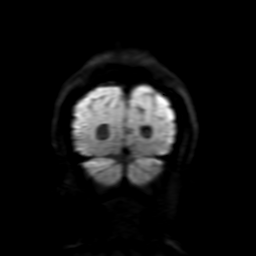
[im 25/74]
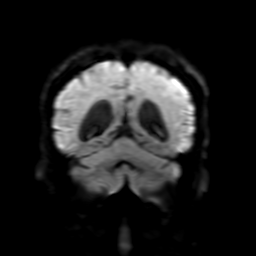
[im 37/74]
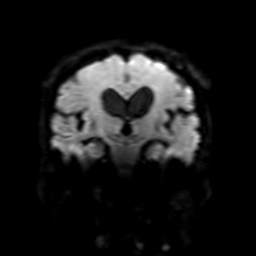
[im 49/74]
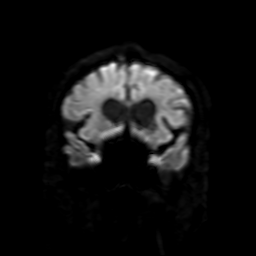
[im 61/74]
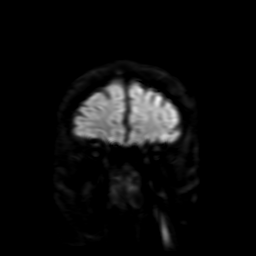
[im 74/74]
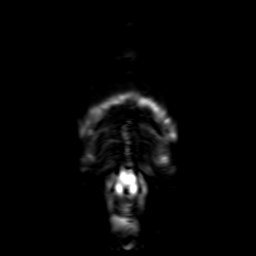

[Series 4: FLAIR · sagittal · 5.0mm · 0.23mm/px · 2 of 24 slices shown (1 of 2)]
[im 1/24]
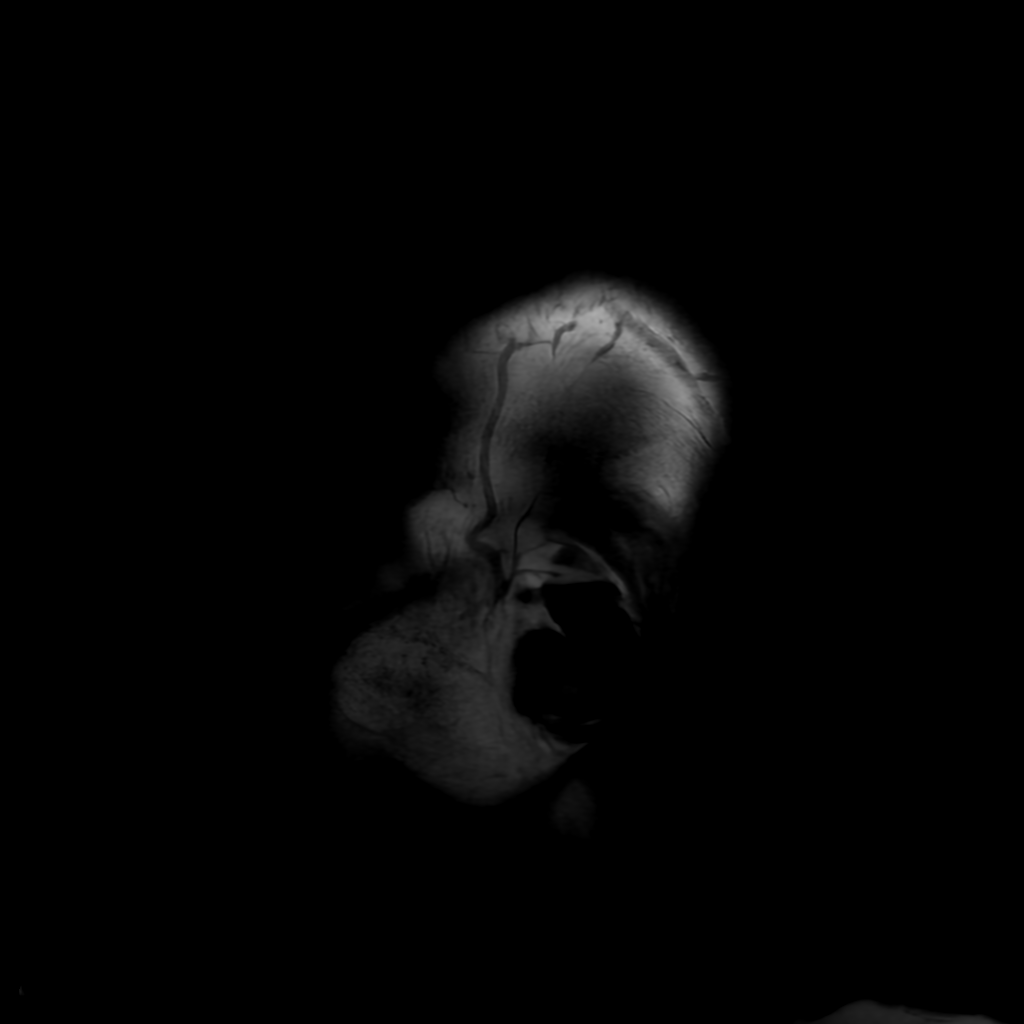
[im 24/24]
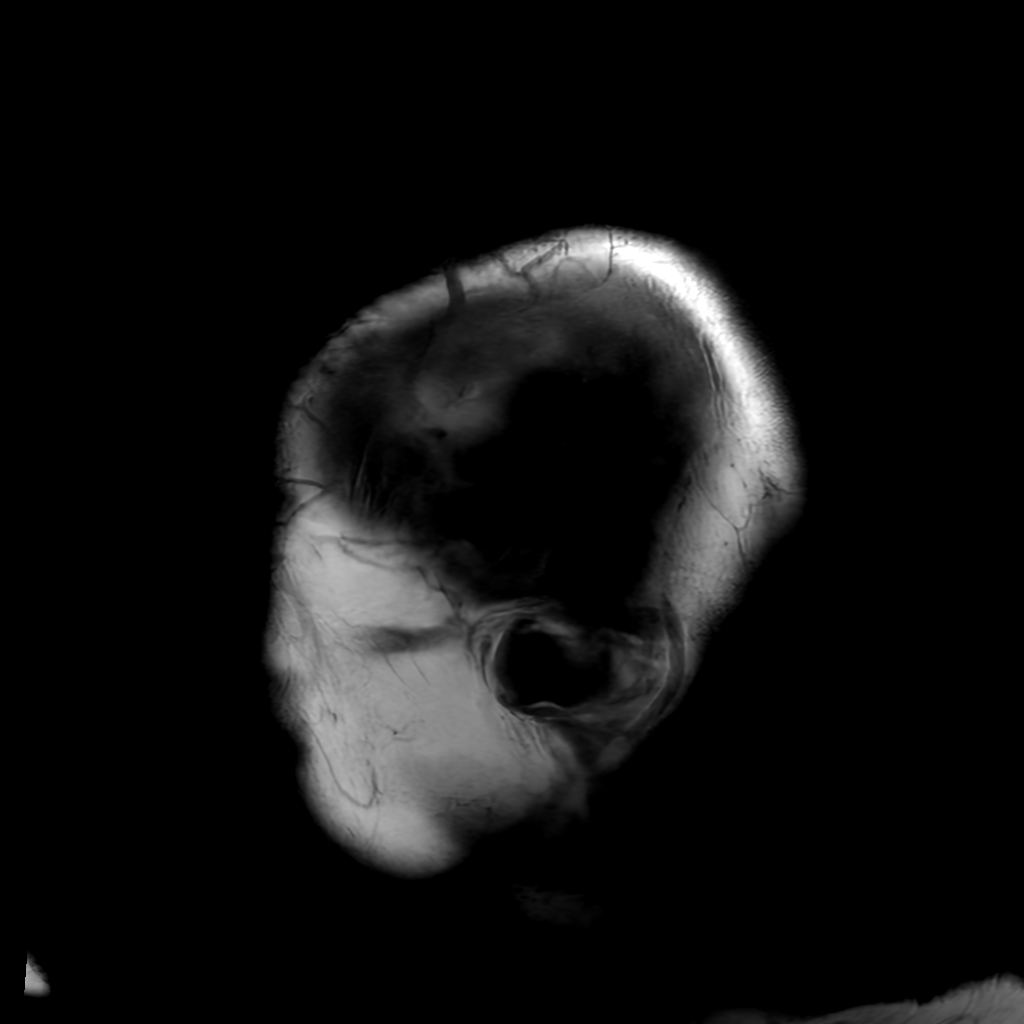

[Series 6: FLAIR · axial · 3.0mm · 0.41mm/px · z∈[-46,+91]mm · 2 of 24 slices shown (2 of 2)]
[im 1/24]
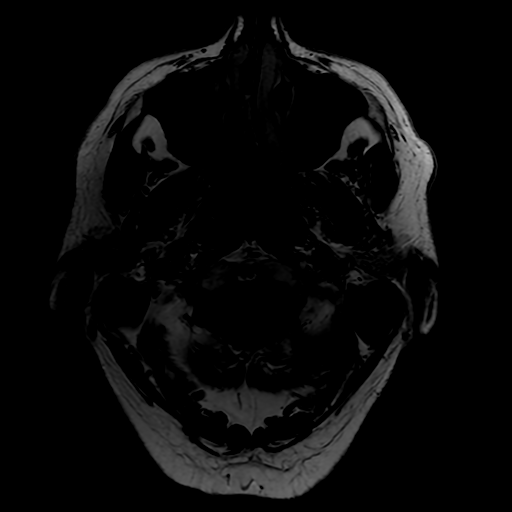
[im 24/24]
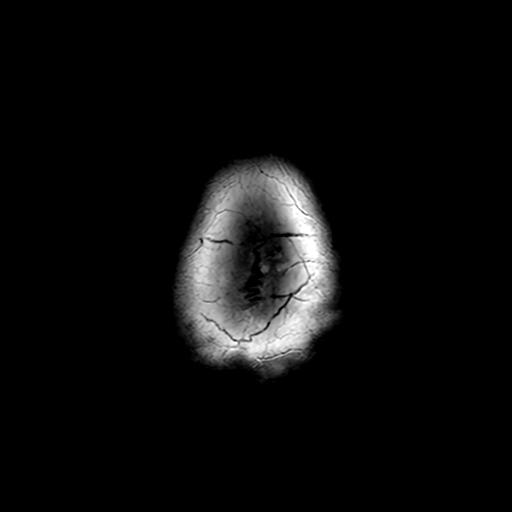

[Series 250: ADC · axial · 3.0mm · 0.94mm/px · z∈[-51,+95]mm · 5 of 50 slices shown (1 of 2)]
[im 1/50]
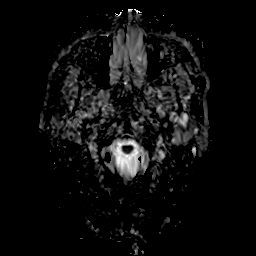
[im 13/50]
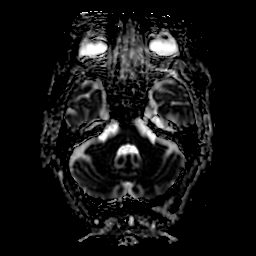
[im 25/50]
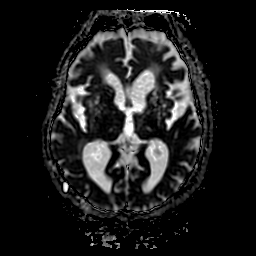
[im 37/50]
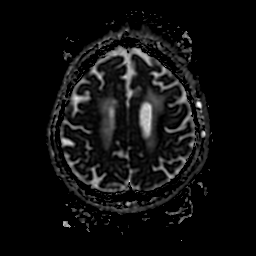
[im 50/50]
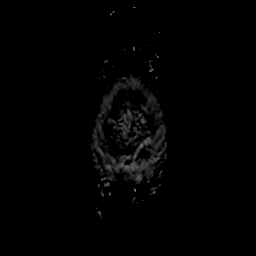

[Series 350: ADC · coronal · 4.0mm · 0.94mm/px · 4 of 37 slices shown (2 of 2)]
[im 1/37]
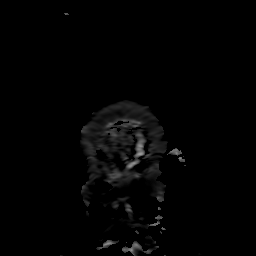
[im 13/37]
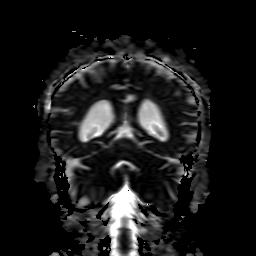
[im 25/37]
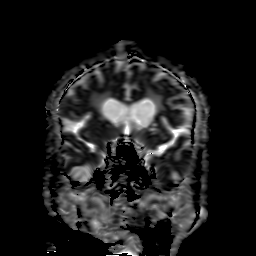
[im 37/37]
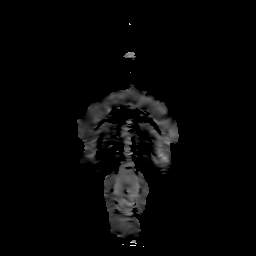

[29 of 48 positions shown; findings below may reference images not displayed]

FINDINGS: Brain: Moderate cerebral atrophy with ex vacuo dilatation. Scattered
and confluent T2/FLAIR hyperintense foci involving the
periventricular and subcortical white matter are nonspecific however
commonly associated with chronic microvascular ischemic changes.

Small foci of restricted diffusion involving the left frontal lobe
at the vertex and left occipital lobe ([DATE], 44). No intracranial
hemorrhage.

No midline shift, mass lesion or extra-axial fluid collection.
Sequela of remote bilateral basal ganglia, thalamic and cerebellar
insults.

Vascular: Please see concurrent CTA

Skull and upper cervical spine: Normal marrow signal.

Sinuses/Orbits: Normal orbits. Mild pansinus disease. No mastoid
effusion.

Other: None.
IMPRESSION: Small acute infarcts involving the left frontal and occipital lobes.

Moderate cerebral atrophy and chronic microvascular ischemic
changes. Remote bilateral basal ganglia, thalamic and cerebellar
insults.

Mild pansinus disease.

These results were called by telephone at the time of interpretation
on [DATE] at [DATE] to provider NAGAP AICEDREB , who verbally
acknowledged these results.

## 2020-03-08 IMAGING — CT CT HEAD W/O CM
3 series · 14 of 47 positions shown, 16 images · non-contrast
Comparison: Head CT [DATE], brain MRI [DATE]

CLINICAL DATA: Head trauma, minor (Age >= 65y)

Post fall with vomiting.
EXAM:
CT HEAD WITHOUT CONTRAST
TECHNIQUE: Contiguous axial images were obtained from the base of the skull
through the vertex without intravenous contrast.

[Series 1: head 5.0 h30s · axial · 0.40mm/px · z∈[-104,+21]mm · 8 of 30 slices shown, 10 images]
[im 3/30  brain]
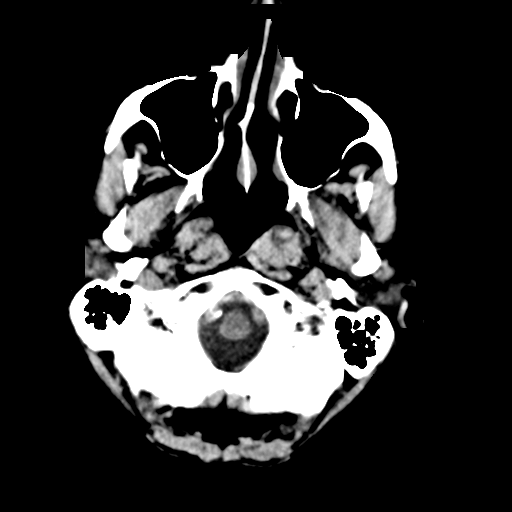
[im 3/30  bone]
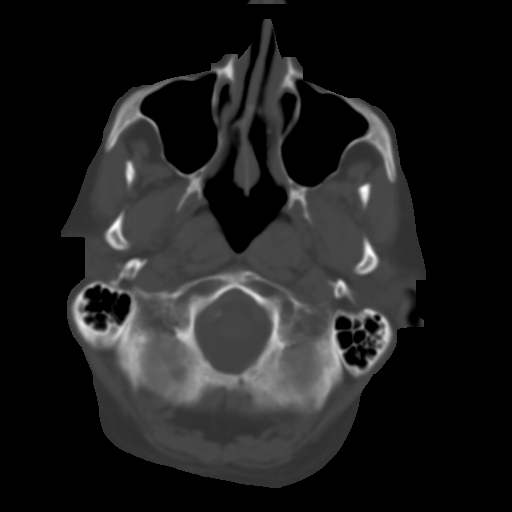
[im 7/30  brain]
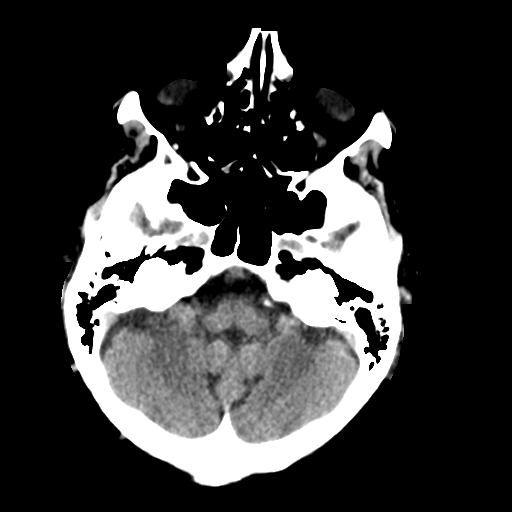
[im 10/30  brain]
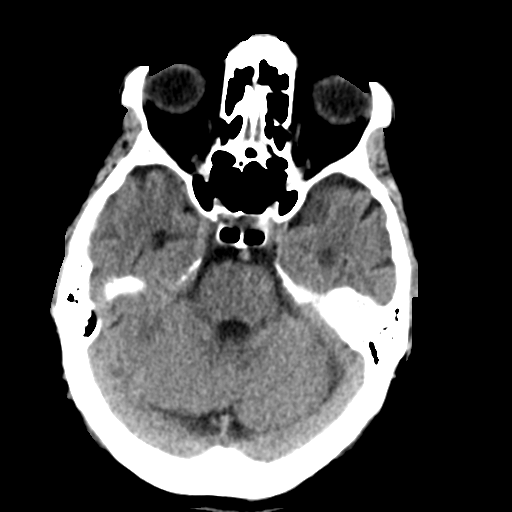
[im 14/30  brain]
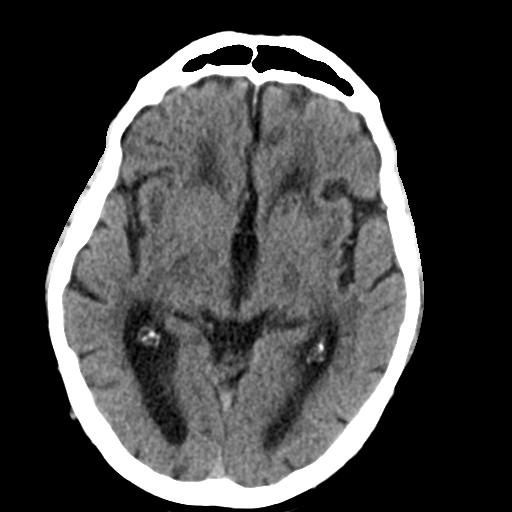
[im 17/30  brain]
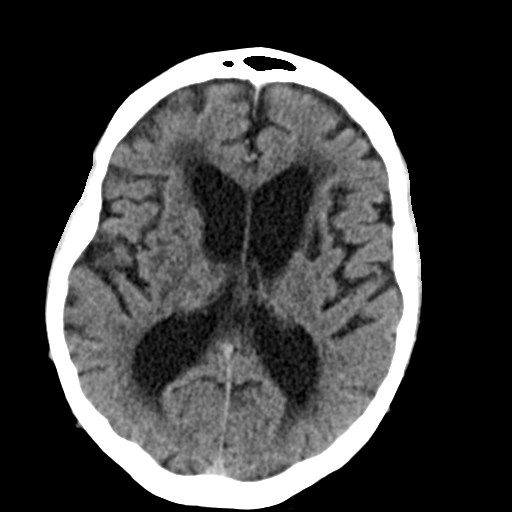
[im 17/30  bone]
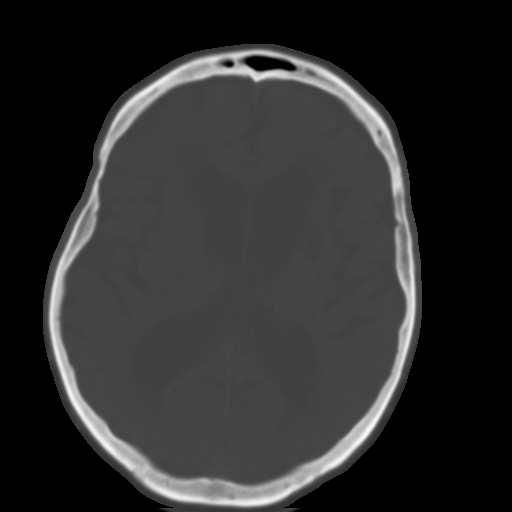
[im 21/30  brain]
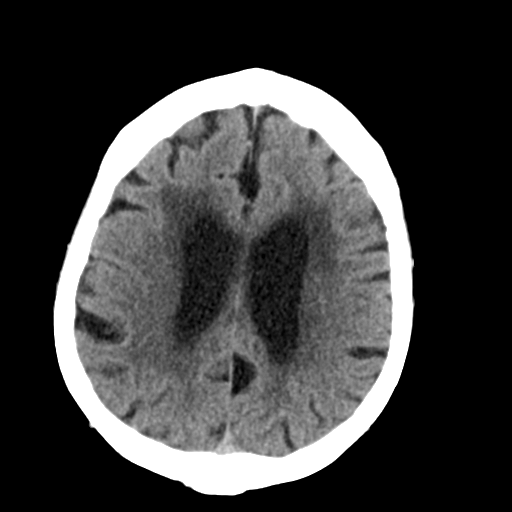
[im 24/30  brain]
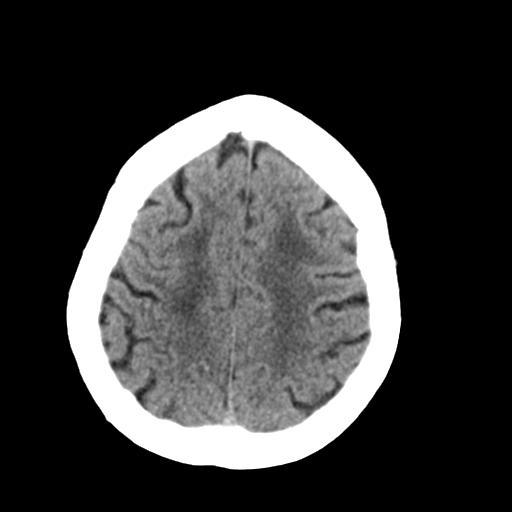
[im 28/30  brain]
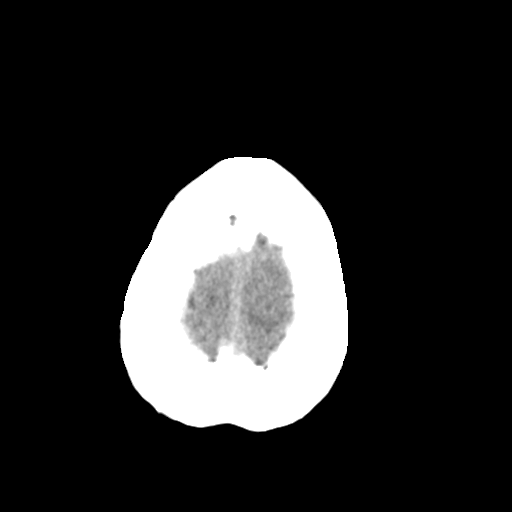

[Series 5: head 3.0 mpr cor · coronal · 0.29mm/px · 3 of 67 slices shown]
[im 23/67  brain]
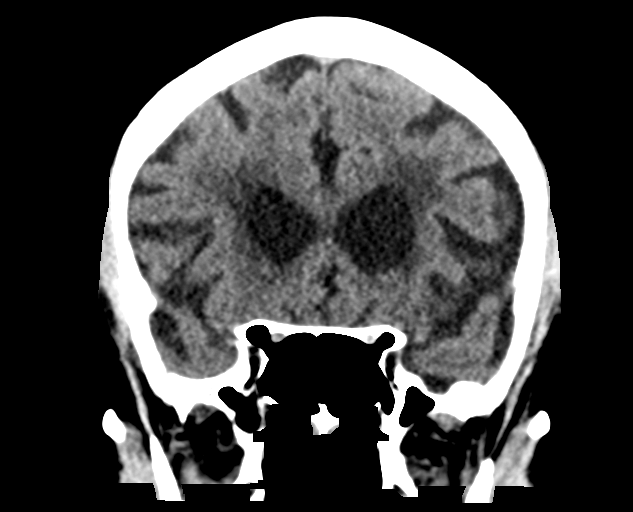
[im 30/67  brain]
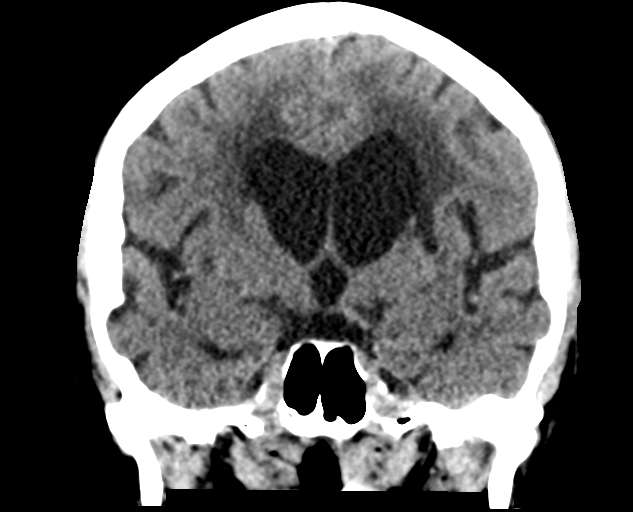
[im 37/67  brain]
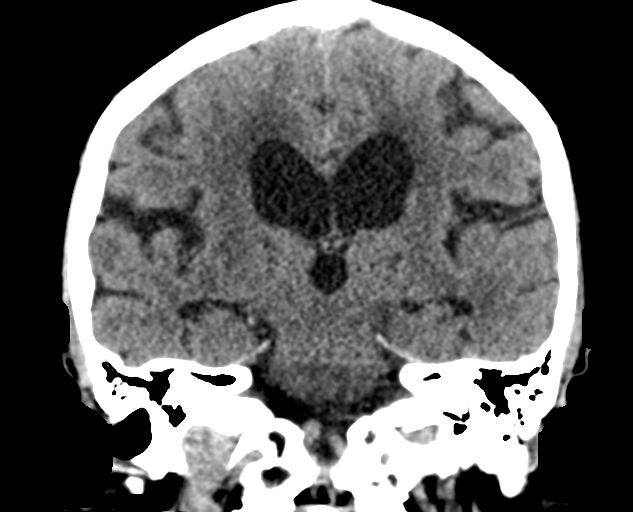

[Series 6: head 3.0 mpr sag · sagittal · 0.32mm/px · 3 of 67 slices shown]
[im 23/67  brain]
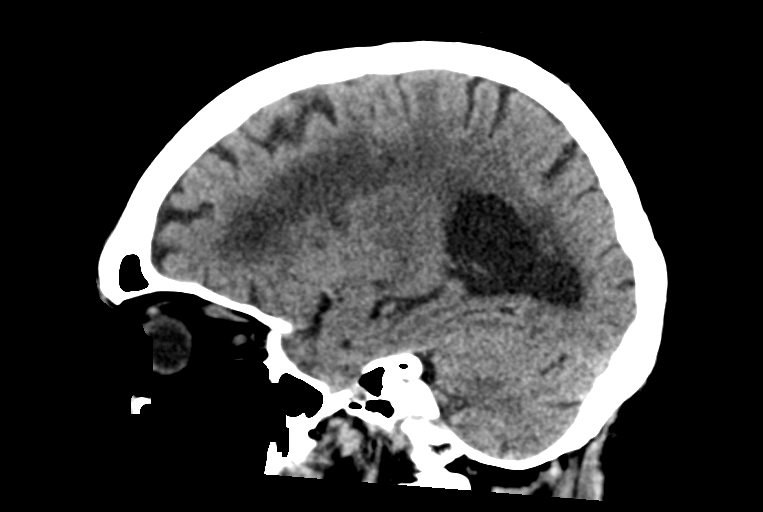
[im 34/67  brain]
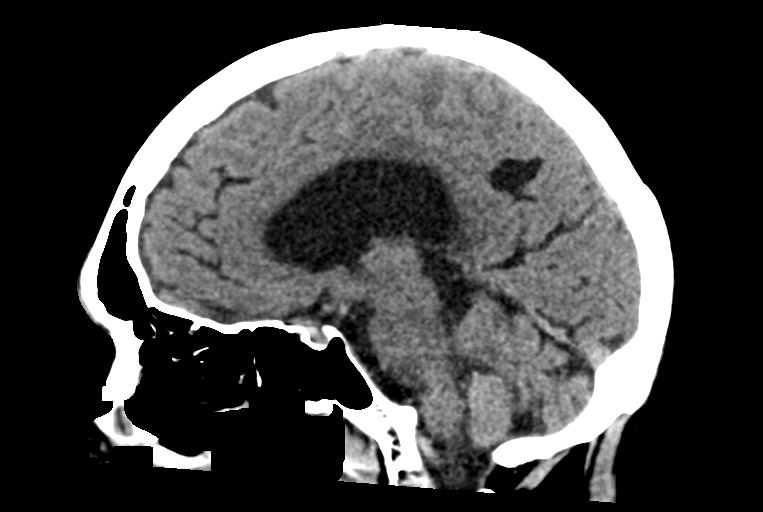
[im 45/67  brain]
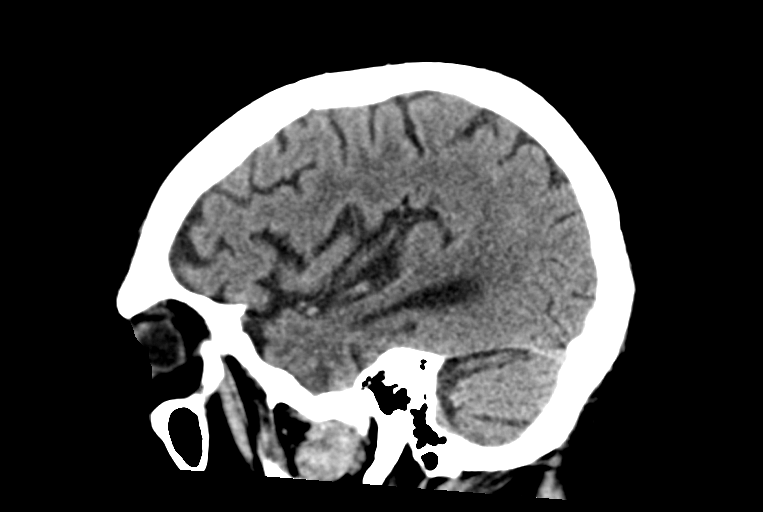

[14 of 47 positions shown; findings below may reference images not displayed]

FINDINGS: Brain: No acute hemorrhage or evidence of acute ischemia. Stable
degree of atrophy, ventricular dilatation, and periventricular
chronic small vessel ischemia. Remote lacunar infarcts in the left
greater than right basal ganglia. No midline shift or mass effect.
No subdural or extra-axial collection.

Vascular: Atherosclerosis of skullbase vasculature without
hyperdense vessel or abnormal calcification.

Skull: No fracture or focal lesion.

Sinuses/Orbits: Minimal chronic mucosal thickening paranasal
sinuses. No sinus fluid level or acute findings. The mastoid air
cells are clear. Orbits are unremarkable.

Other: None.
IMPRESSION: 1. No acute intracranial abnormality. No skull fracture.
2. Stable atrophy, chronic small vessel ischemia, and remote basal
gangliar lacunar infarcts.

## 2020-03-08 IMAGING — MR MR THORACIC SPINE W/O CM
4 of 6 series · 19 of 48 positions shown · non-contrast
Comparison: None available.

CLINICAL DATA: Initial evaluation for acute myelopathy.

EXAM:
MRI CERVICAL AND THORACIC SPINE WITHOUT CONTRAST
TECHNIQUE: Multiplanar and multiecho pulse sequences of the cervical spine, to
include the craniocervical junction and cervicothoracic junction,
and the thoracic spine, were obtained without intravenous contrast.

[Series 18: T1 · sagittal · 3.3mm · 0.62mm/px · 3 of 9 slices shown (1 of 2)]
[im 1/9]
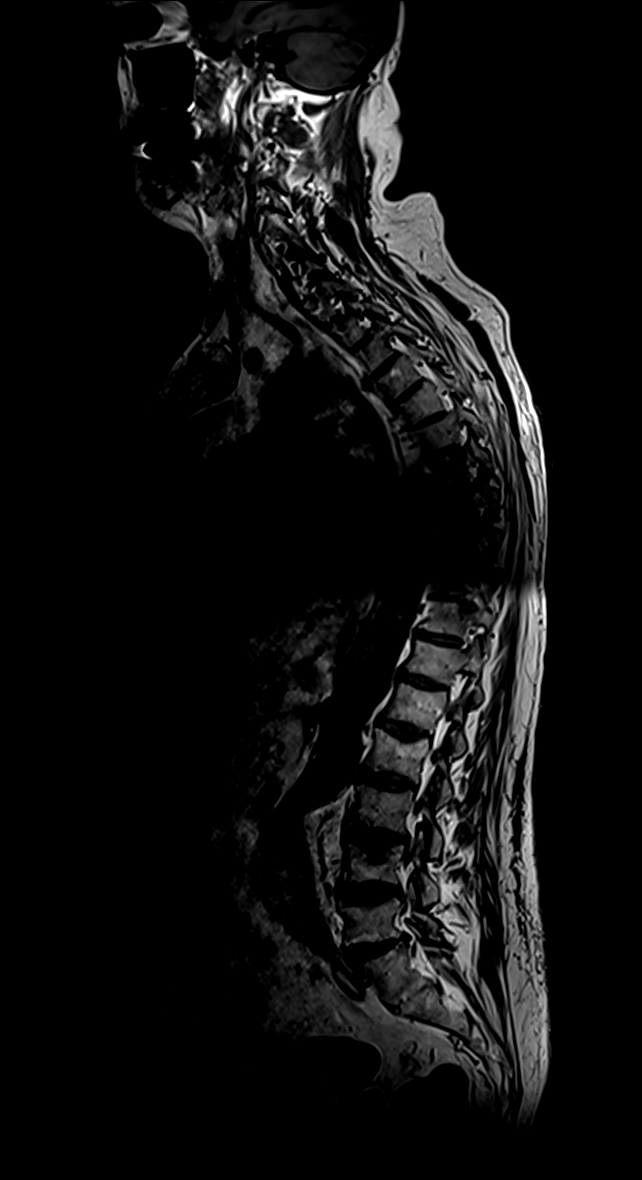
[im 6/9]
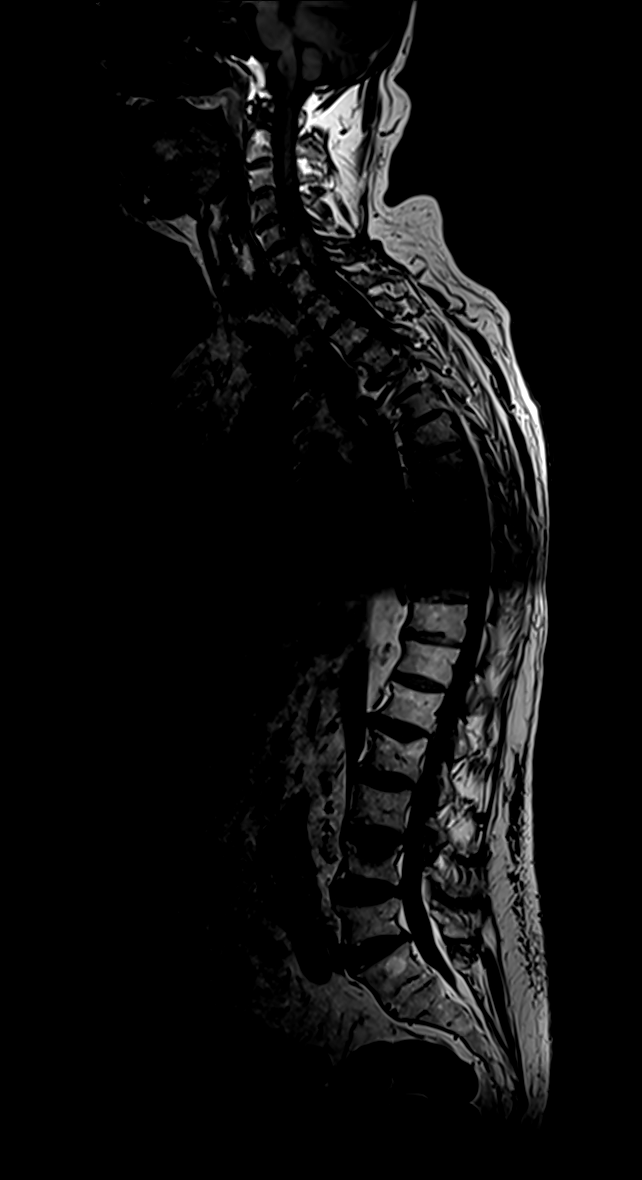
[im 9/9]
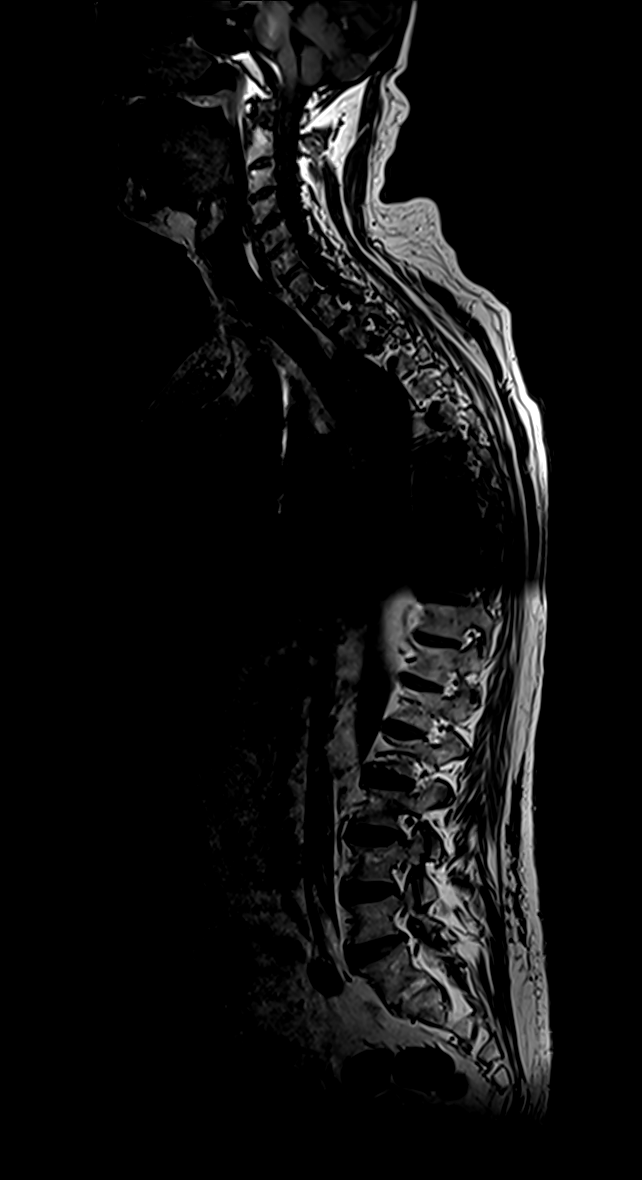

[Series 19: T2 · sagittal · 3.0mm · 0.76mm/px · 6 of 19 slices shown (1 of 2)]
[im 1/19]
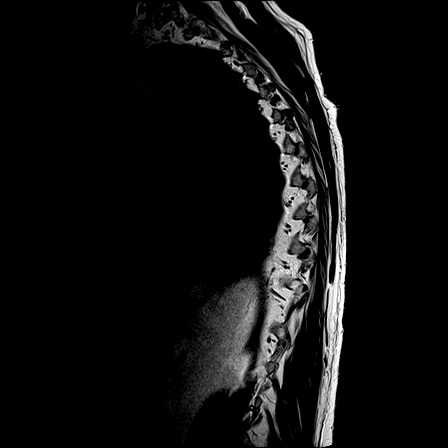
[im 4/19]
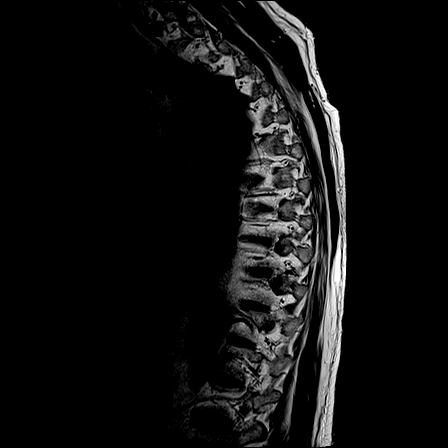
[im 8/19]
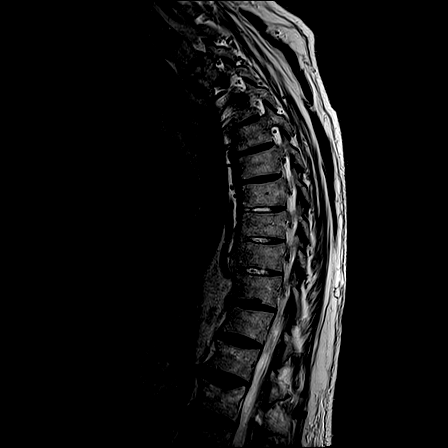
[im 11/19]
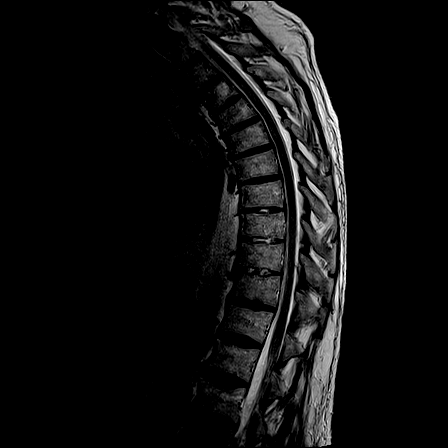
[im 15/19]
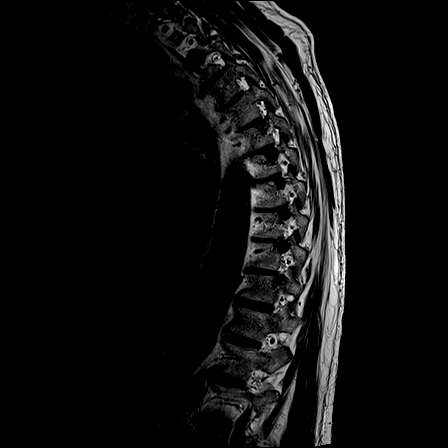
[im 19/19]
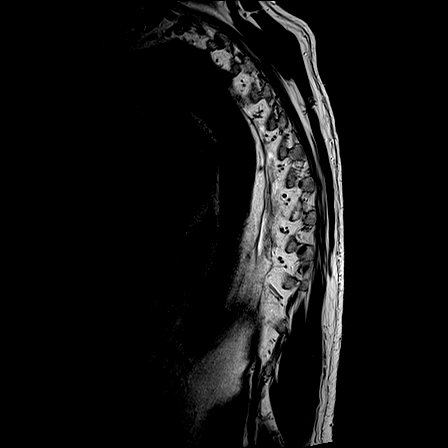

[Series 20: T1 · sagittal · 3.0mm · 0.76mm/px · 3 of 19 slices shown (2 of 2)]
[im 4/19]
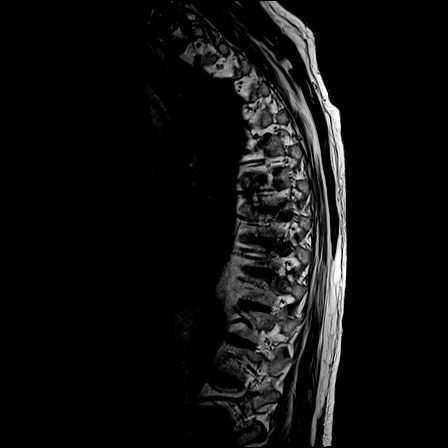
[im 11/19]
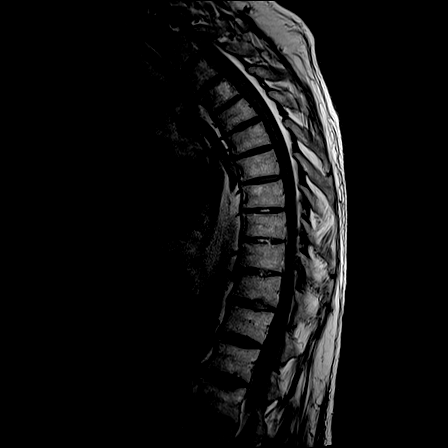
[im 19/19]
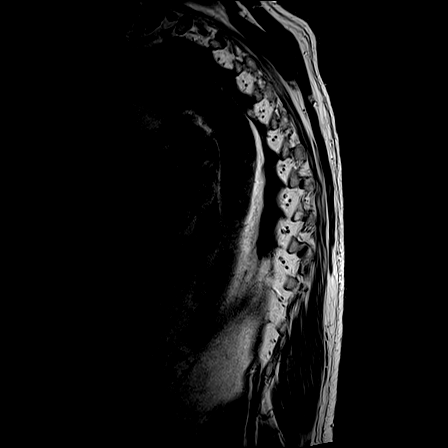

[Series 24: T2 · axial · 4.0mm · 0.59mm/px · z∈[-362,-186]mm · 7 of 39 slices shown (2 of 2)]
[im 1/39]
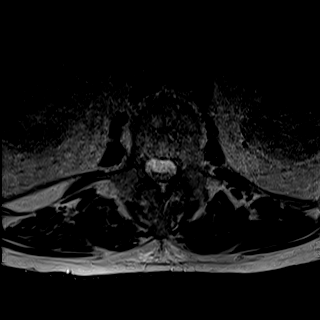
[im 7/39]
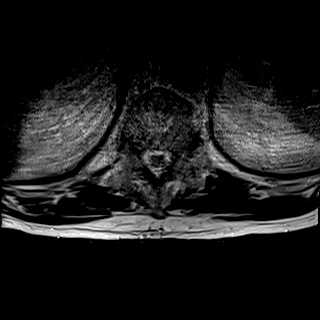
[im 13/39]
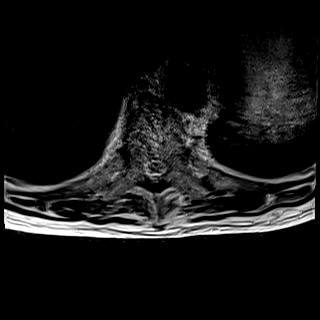
[im 16/39]
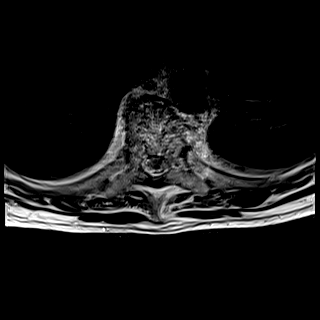
[im 20/39]
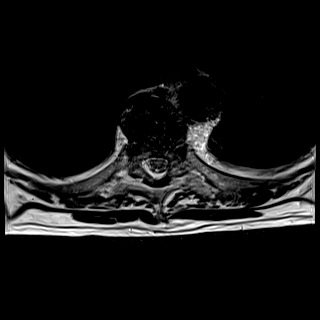
[im 23/39]
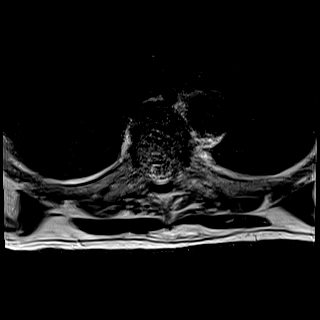
[im 32/39]
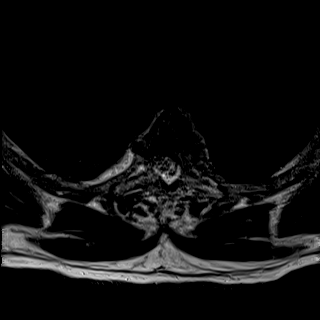

[19 of 48 positions shown; findings below may reference images not displayed]

FINDINGS: MRI CERVICAL SPINE FINDINGS

Examination degraded by motion artifact.

Alignment: Physiologic with preservation of the normal cervical
lordosis. No listhesis.

Vertebrae: Vertebral body height maintained without acute or chronic
fracture. Bone marrow signal intensity within normal limits. No
worrisome osseous lesions. Minimal reactive marrow edema seen about
the left C6-7 facet due to facet arthritis. No other abnormal marrow
edema.

Cord: Normal signal and morphology.

Posterior Fossa, vertebral arteries, paraspinal tissues: Visualized
brain and posterior fossa within normal limits. Craniocervical
junction normal. Paraspinous and prevertebral soft tissues within
normal limits. Normal intravascular flow voids seen within the
vertebral arteries bilaterally.

Disc levels:

C2-C3: Negative interspace. Right greater than left facet
hypertrophy with associated ankylosis on the right. No spinal
stenosis. Foramina remain patent.

C3-C4: Minimal disc bulge with uncovertebral hypertrophy. Moderate
right greater than left facet and ligament flavum hypertrophy. No
significant spinal stenosis. Moderate bilateral C4 foraminal
narrowing.

C4-C5: Mild disc bulge with uncovertebral hypertrophy. Superimposed
small central/right paracentral disc protrusion mildly indents the
ventral thecal sac (series 5, image 20). Moderate right with mild
left facet hypertrophy. No significant spinal stenosis. Moderate
left with mild to moderate right C5 foraminal stenosis.

C5-C6: Degenerative intervertebral disc space narrowing with diffuse
disc bulge and bilateral uncovertebral hypertrophy. Moderate left
with mild right facet degeneration with associated ankylosis on the
left. No significant spinal stenosis. Moderate left with mild right
C6 foraminal narrowing.

C6-C7: Mild annular disc bulge. Mild to moderate bilateral facet
hypertrophy. No significant spinal stenosis. Foramina remain patent.

C7-T1: Minimal disc bulge. Mild facet hypertrophy. No canal or
foraminal stenosis.

MRI THORACIC SPINE FINDINGS

Examination degraded by motion artifact.

Alignment: Mild scoliosis with exaggeration of the normal thoracic
kyphosis. No listhesis.

Vertebrae: Vertebral body height maintained without acute or chronic
fracture. Bone marrow signal intensity within normal limits. Few
scattered benign hemangiomata noted. No worrisome osseous lesions.
No abnormal marrow edema.

Cord: Normal signal and morphology. No convincing cord signal
abnormality on this motion degraded exam. Conus medullaris
terminates at approximately the L1 level.

Paraspinal and other soft tissues: Negative.

Disc levels:

No significant degenerative disc disease seen for patient age. No
disc bulge or focal disc herniation. No significant canal or
foraminal stenosis. No neural impingement.
IMPRESSION: 1. Normal MRI of the cervicothoracic spinal cord. No cord signal
changes to suggest myelopathy or other abnormality.
2. Mild multilevel cervical spondylosis and facet arthrosis without
significant spinal stenosis. Associated mild to moderate bilateral
C4 through C6 foraminal narrowing as above.
3. No significant disc pathology within the thoracic spine for
patient age. No thoracic spinal stenosis or neural impingement.

## 2020-03-08 IMAGING — CT CT CERVICAL SPINE W/O CM
3 series · 9 of 33 positions shown, 11 images · non-contrast
Comparison: None.

CLINICAL DATA: Neck trauma (Age >= 65y)

Post fall with vomiting.
EXAM:
CT CERVICAL SPINE WITHOUT CONTRAST
TECHNIQUE: Multidetector CT imaging of the cervical spine was performed without
intravenous contrast. Multiplanar CT image reconstructions were also
generated.

[Series 5: c_spine 2.0 st · axial · 0.42mm/px · z∈[-173,-173]mm · 1 of 108 slices shown, 2 images]
[im 58/108  soft-tissue]
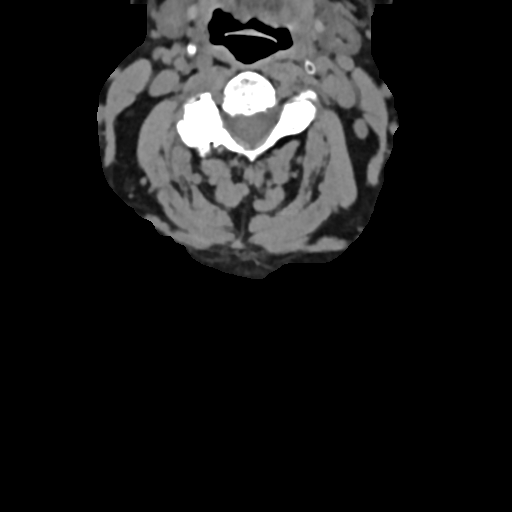
[im 58/108  bone]
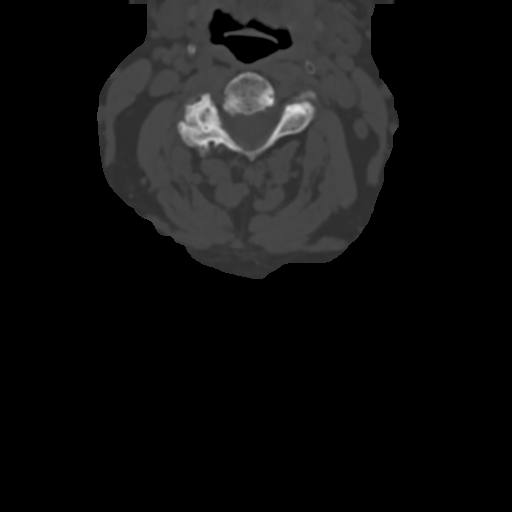

[Series 8: coronal bone · coronal · 0.23mm/px · 3 of 68 slices shown]
[im 14/68  bone]
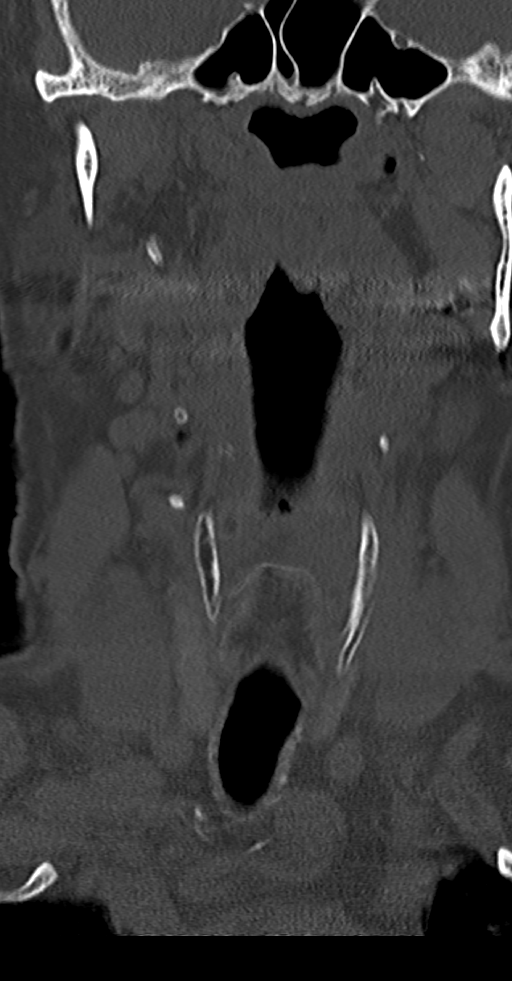
[im 27/68  bone]
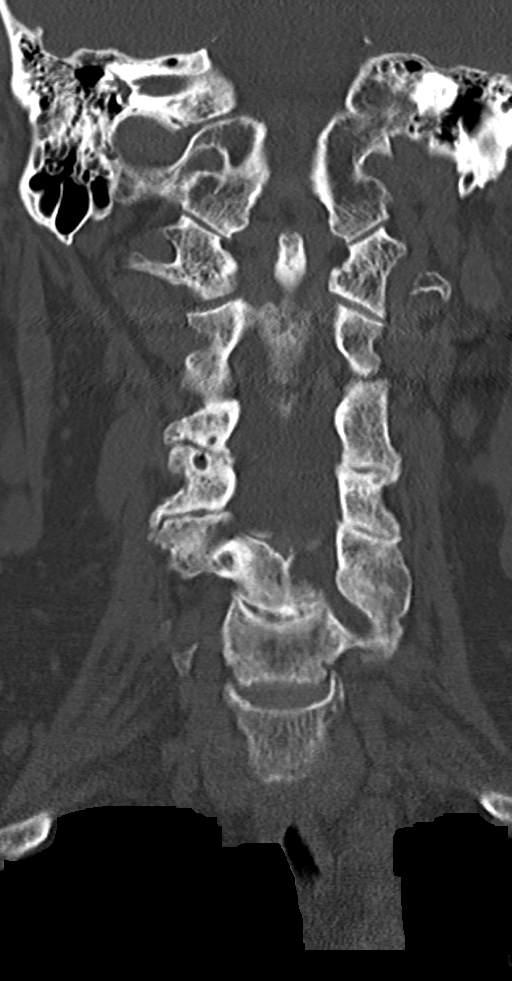
[im 41/68  bone]
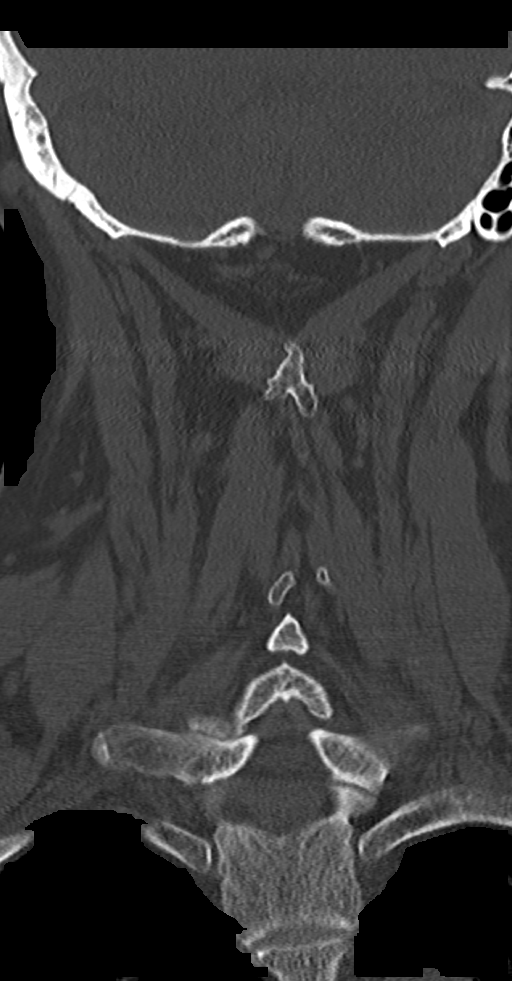

[Series 9: sagittal bone · sagittal · 0.23mm/px · 5 of 61 slices shown, 6 images]
[im 21/61  bone]
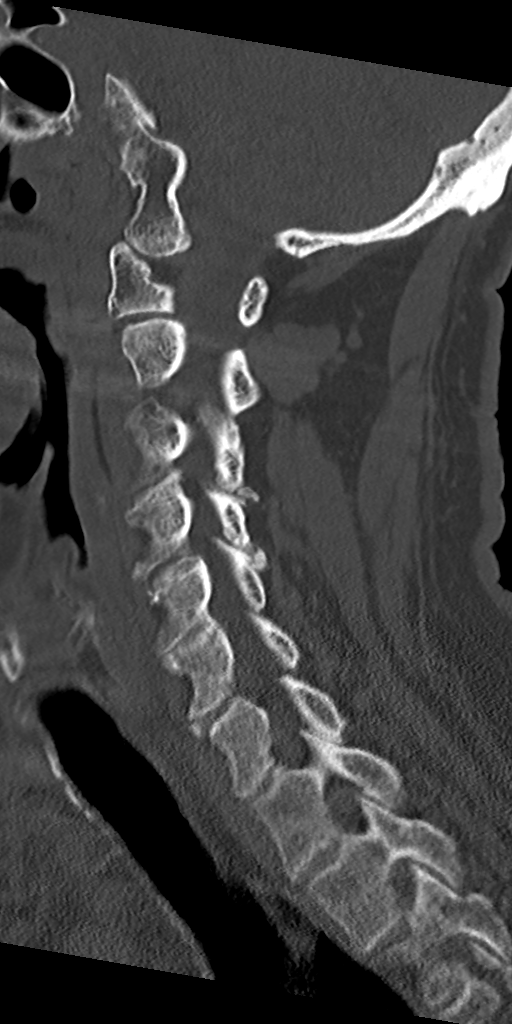
[im 26/61  bone]
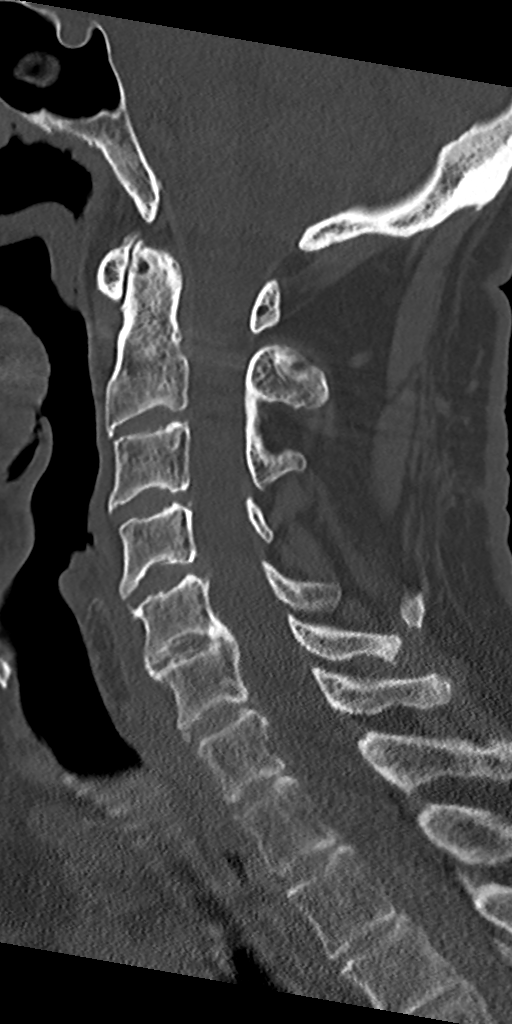
[im 31/61  soft-tissue]
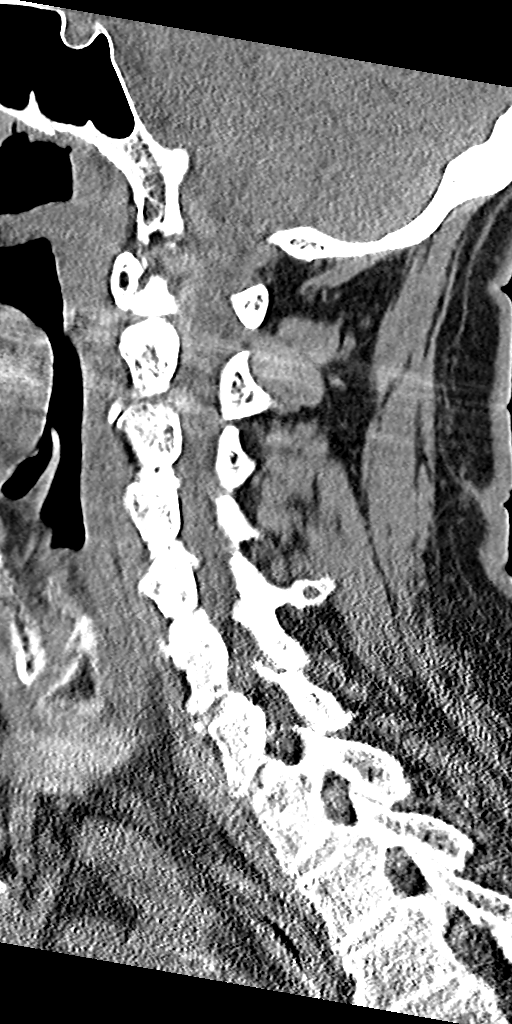
[im 31/61  bone]
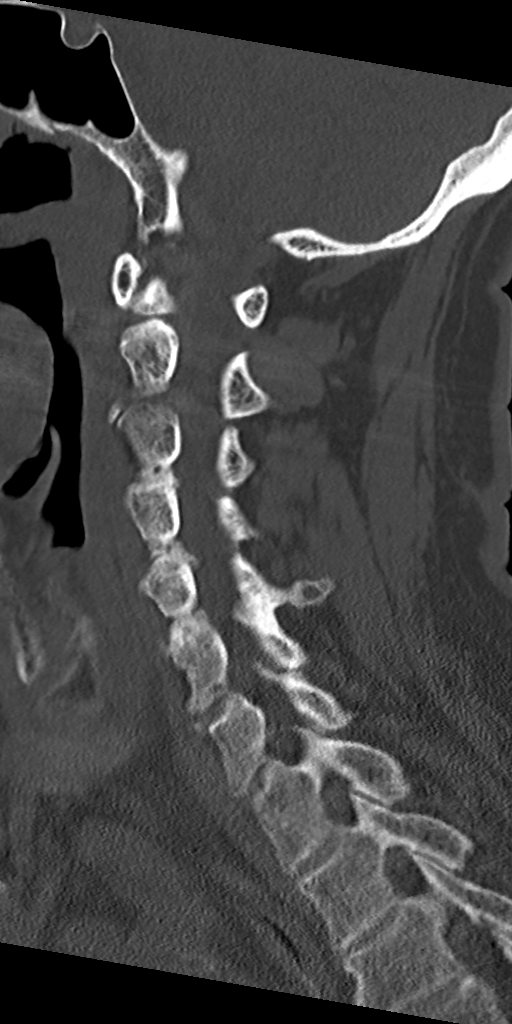
[im 36/61  bone]
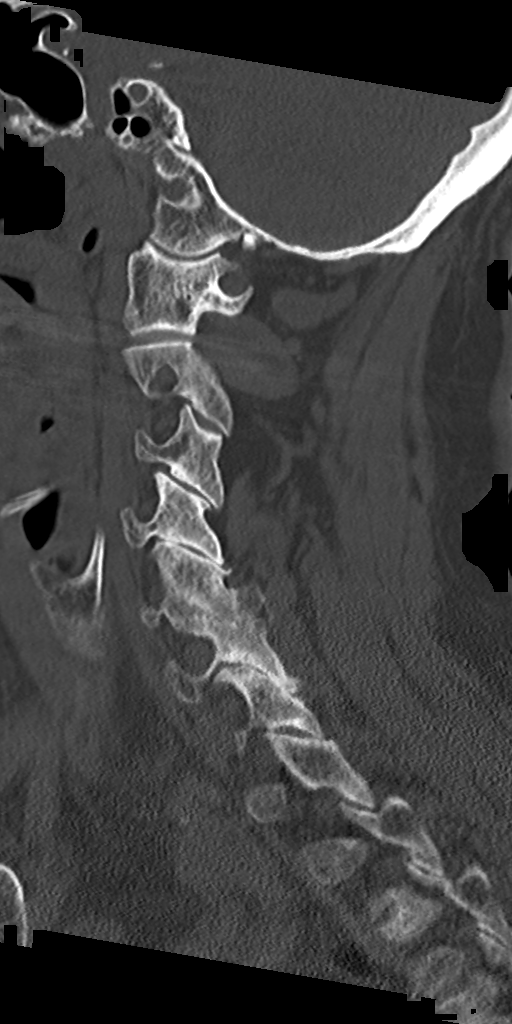
[im 41/61  bone]
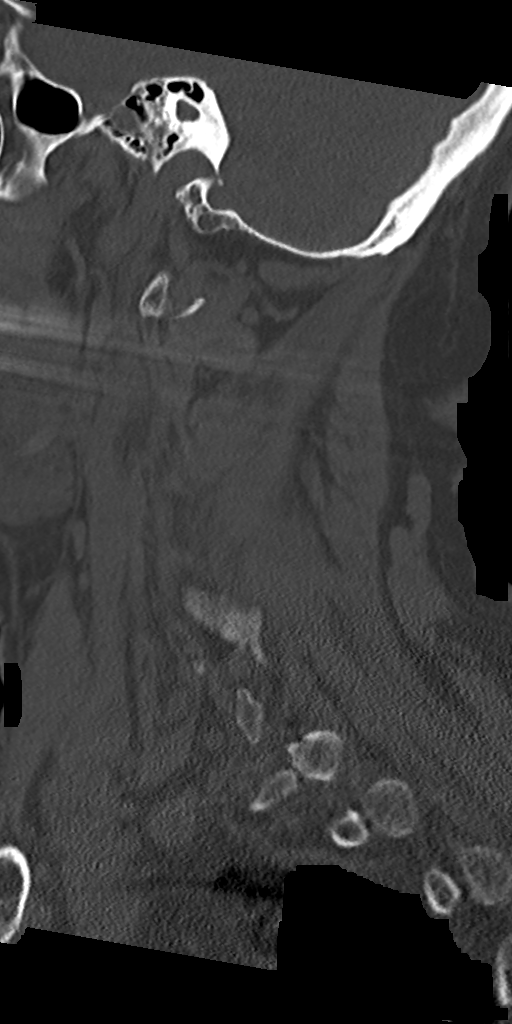

[9 of 33 positions shown; findings below may reference images not displayed]

FINDINGS: Alignment: Normal.

Skull base and vertebrae: No acute fracture. Vertebral body heights
are maintained. The dens and skull base are intact.

Soft tissues and spinal canal: No prevertebral fluid or swelling. No
visible canal hematoma.

Disc levels: Disc space narrowing and endplate spurring at C5-C6.
There is multilevel endplate spurring at additional levels with
preservation of disc spaces. Facet hypertrophy at C4-C5 on the left
and multilevel on the right.

Upper chest: Breathing motion artifact. No evidence of acute
abnormality.

Other: Carotid calcifications.
IMPRESSION: Degenerative change in the cervical spine without acute fracture or
subluxation.

## 2020-03-08 IMAGING — CT CT ANGIO NECK
1 of 8 series · 13 of 46 positions shown, 18 images · IV contrast (OMNI)
Comparison: [DATE] head CT and prior.

CLINICAL DATA: Neuro deficit

EXAM:
CT ANGIOGRAPHY HEAD AND NECK
TECHNIQUE: Multidetector CT imaging of the head and neck was performed using
the standard protocol during bolus administration of intravenous
contrast. Multiplanar CT image reconstructions and MIPs were
obtained to evaluate the vascular anatomy. Carotid stenosis
measurements (when applicable) are obtained utilizing NASCET
criteria, using the distal internal carotid diameter as the
denominator.
CONTRAST:  50mL OMNIPAQUE IOHEXOL 350 MG/ML SOLN

[Series 6: thin · axial · 0.52mm/px · z∈[-309,-4]mm · 13 of 698 slices shown, 18 images]
[im 44/698  soft-tissue]
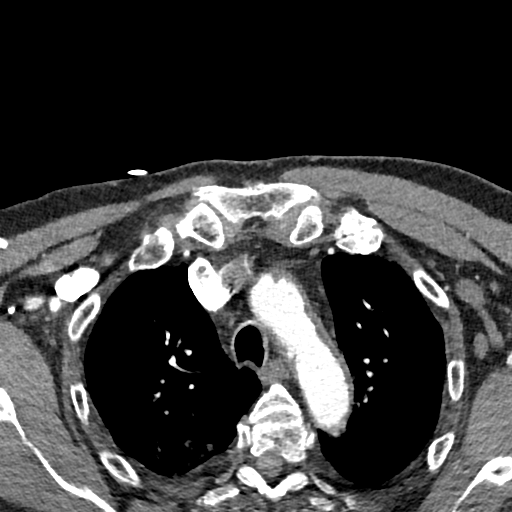
[im 44/698  bone]
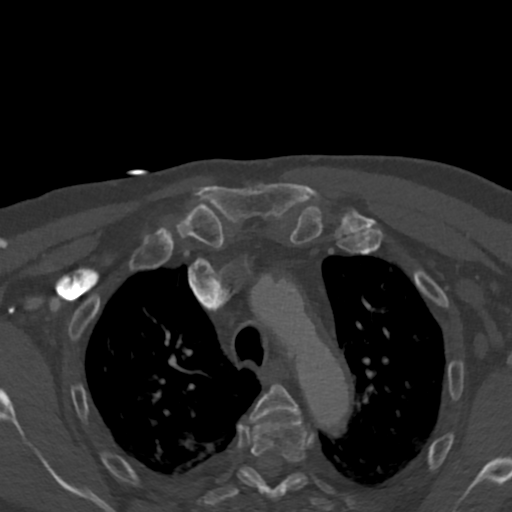
[im 88/698  soft-tissue]
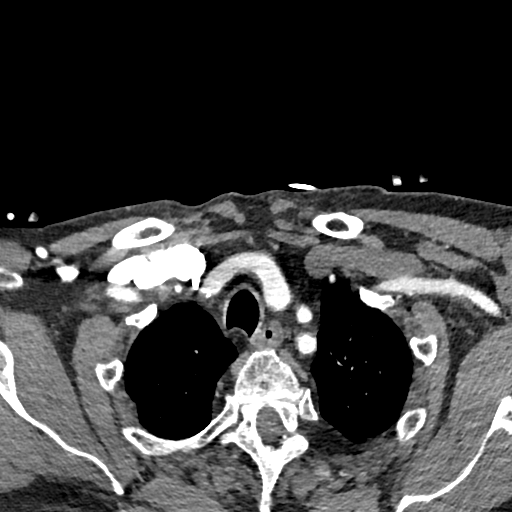
[im 175/698  soft-tissue]
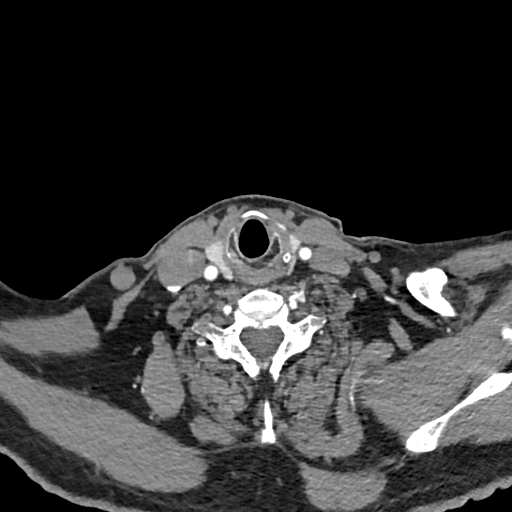
[im 218/698  soft-tissue]
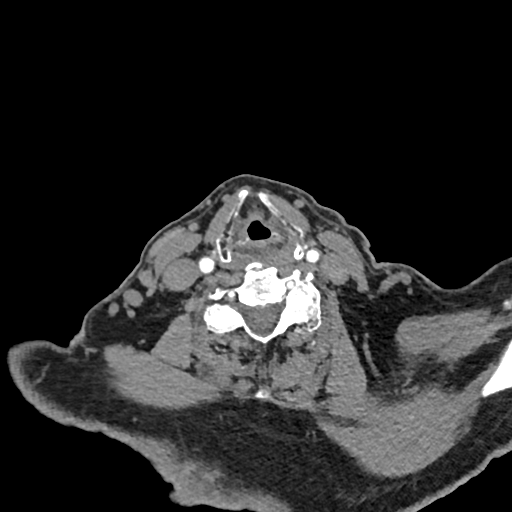
[im 262/698  soft-tissue]
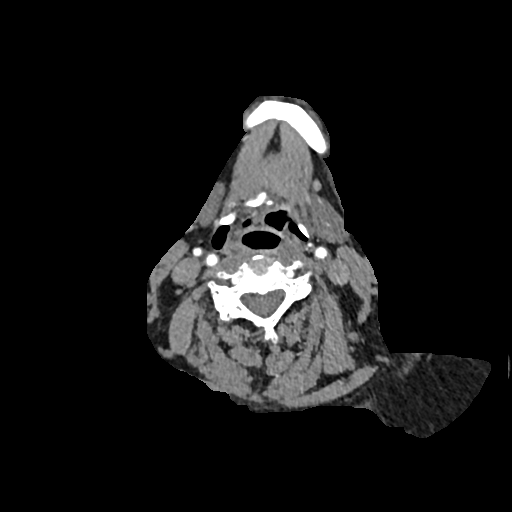
[im 305/698  soft-tissue]
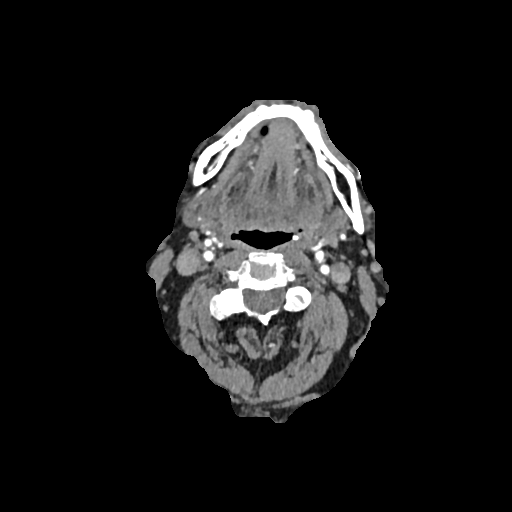
[im 393/698  soft-tissue]
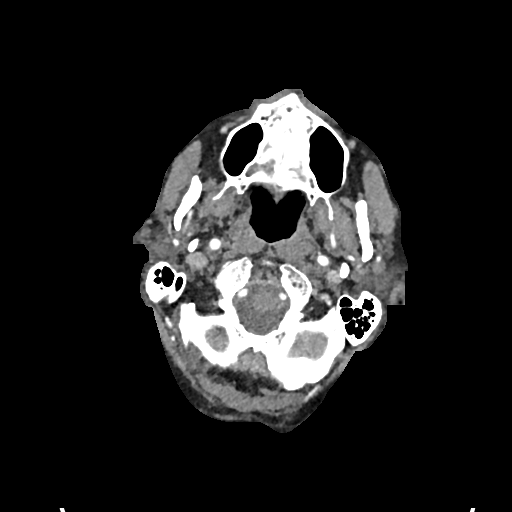
[im 436/698  soft-tissue]
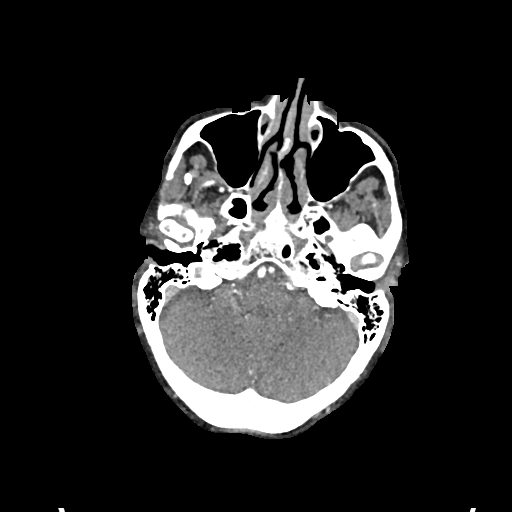
[im 480/698  soft-tissue]
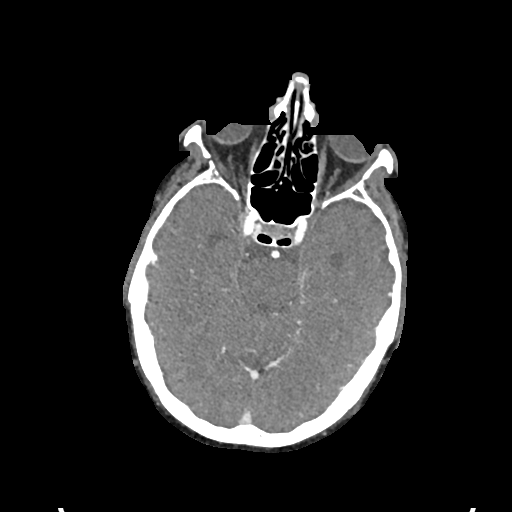
[im 480/698  bone]
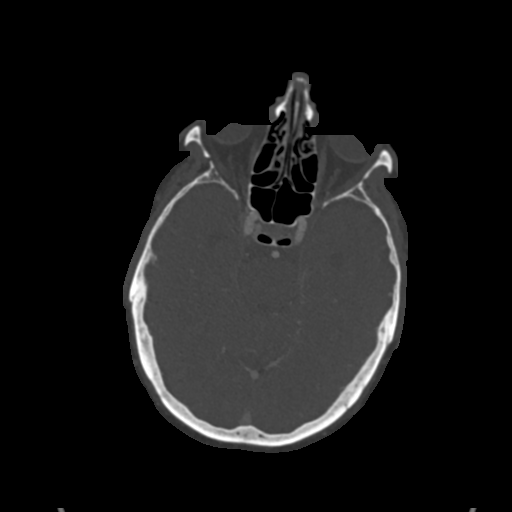
[im 523/698  soft-tissue]
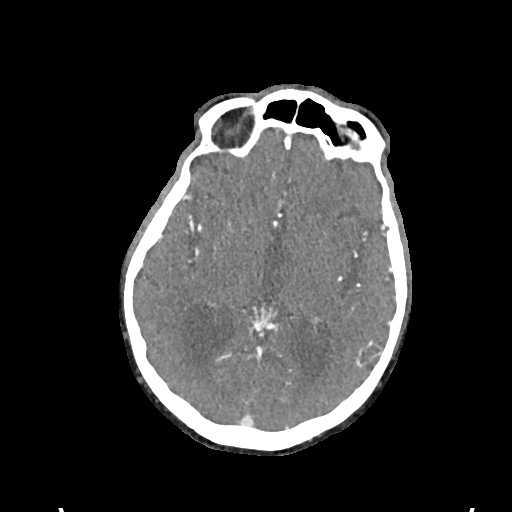
[im 523/698  lung]
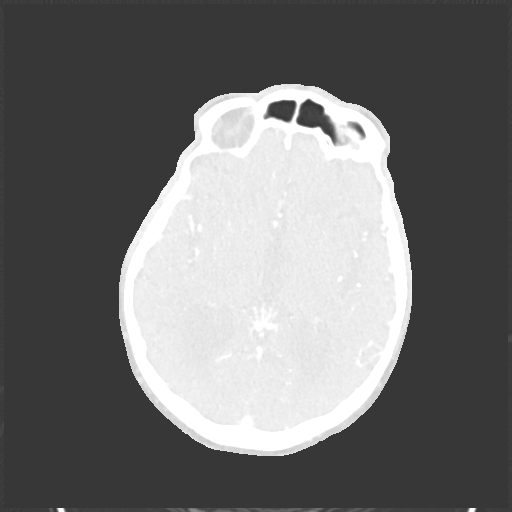
[im 567/698  lung]
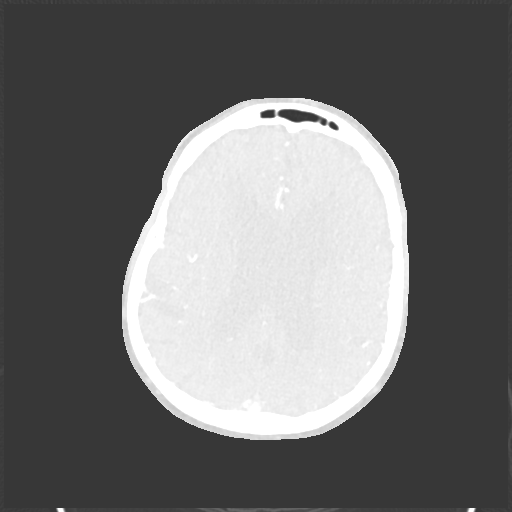
[im 610/698  soft-tissue]
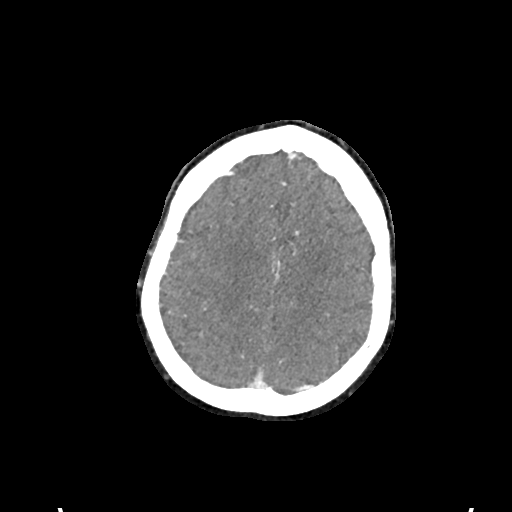
[im 610/698  lung]
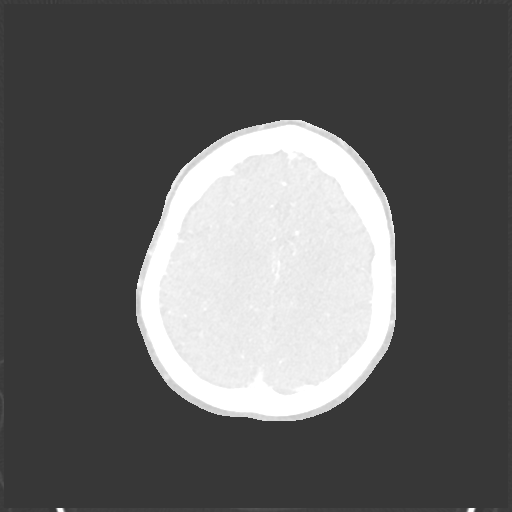
[im 654/698  soft-tissue]
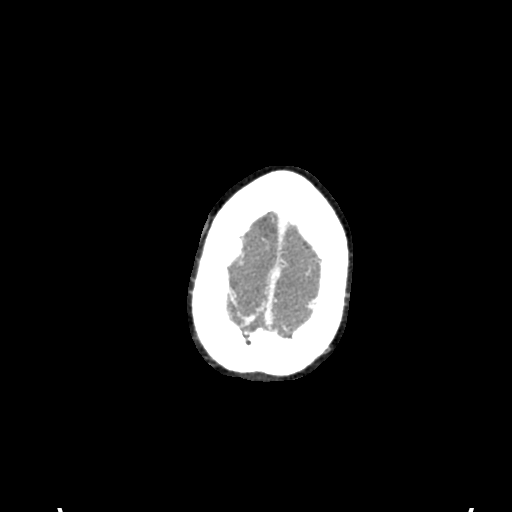
[im 654/698  lung]
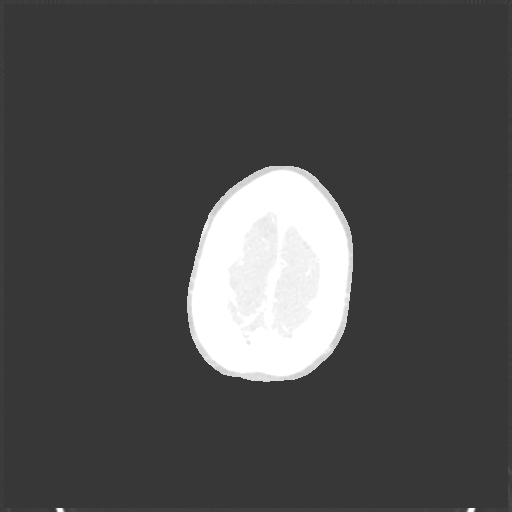

[13 of 46 positions shown; findings below may reference images not displayed]

FINDINGS: CTA NECK FINDINGS

Aortic arch: Standard branching. Imaged portion shows no evidence of
aneurysm or dissection. Extensive noncalcified atheromatous plaque
involving the aortic arch and great vessel origins. Some of the
plaque demonstrates a pedunculated appearance. Approximately 30%
luminal narrowing of the innominate artery secondary to atheromatous
disease. No significant stenosis of the left common carotid and left
subclavian artery origins.

Right carotid system: Noncalcified atheromatous disease involving
the proximal right common carotid artery without significant luminal
narrowing. Carotid bifurcation calcified atheromatous plaque without
high-grade narrowing. Mild narrowing of the proximal right petrous
segment secondary to noncalcified atheromatous plaque.

Left carotid system: Patent normal caliber common carotid artery.
Carotid bifurcation atherosclerotic calcifications with
approximately 80-90% narrowing of the proximal ICA. The carotid bulb
is narrowed by approximately 50%.

Vertebral arteries: Dominant right vertebral artery. Right V1 and V4
segment calcified atheromatous plaque with mild narrowing. Left
vertebral artery atheromatous disease with no greater than mild
narrowing.

Skeleton: Multilevel spondylosis.  No acute osseous abnormalities.

Other neck: No adenopathy.  No soft tissue mass.

Upper chest: Emphysema. Dependent atelectasis. Motion artifact
limits evaluation.

Review of the MIP images confirms the above findings

CTA HEAD FINDINGS

Anterior circulation: Bilateral carotid siphon atherosclerotic
calcifications. Mild narrowing of the left paraclinoid ICA. Patent,
normal caliber appearance of the anterior and middle cerebral
arteries.

Posterior circulation: Dominant right vertebral artery. Normal
caliber patent basilar, superior cerebellar and posterior cerebral
arteries.

Venous sinuses: As permitted by contrast timing, patent.

Anatomic variants: None.

Review of the MIP images confirms the above findings
IMPRESSION: Extensive atheromatous disease involving the aorta and its branch
vessels. 30% luminal narrowing of the innominate secondary to
atheromatous plaque.

Approximately 50% narrowing of the left carotid bulb and 80-90%
narrowing of the proximal left ICA secondary to atheromatous plaque.

Mild right V1 and V4 segment narrowing secondary to atheromatous
disease.

Mild narrowing of the proximal right petrous and left paraclinoid
ICA.

## 2020-03-08 MED ORDER — IOHEXOL 350 MG/ML SOLN
50.0000 mL | Freq: Once | INTRAVENOUS | Status: AC | PRN
  Administered 2020-03-08: 50 mL via INTRAVENOUS

## 2020-03-08 MED ORDER — ONDANSETRON HCL 4 MG/2ML IJ SOLN
4.0000 mg | Freq: Once | INTRAMUSCULAR | Status: AC
  Administered 2020-03-08: 4 mg via INTRAVENOUS
  Filled 2020-03-08: qty 2

## 2020-03-08 MED ORDER — FOLIC ACID 1 MG PO TABS
1.0000 mg | ORAL_TABLET | Freq: Every day | ORAL | Status: DC
  Administered 2020-03-09 – 2020-03-10 (×2): 1 mg via ORAL
  Filled 2020-03-08 (×2): qty 1

## 2020-03-08 MED ORDER — SODIUM CHLORIDE 0.9 % IV SOLN: INTRAVENOUS | Status: AC

## 2020-03-08 MED ORDER — THIAMINE HCL 100 MG/ML IJ SOLN
400.0000 mg | Freq: Three times a day (TID) | INTRAVENOUS | Status: AC
  Administered 2020-03-09 – 2020-03-11 (×8): 400 mg via INTRAVENOUS
  Filled 2020-03-08 (×10): qty 4

## 2020-03-08 MED ORDER — LORAZEPAM 1 MG PO TABS: 1.0000 mg | ORAL_TABLET | ORAL | Status: AC | PRN

## 2020-03-08 MED ORDER — ACETAMINOPHEN 500 MG PO TABS
1000.0000 mg | ORAL_TABLET | Freq: Once | ORAL | Status: AC
  Administered 2020-03-08: 1000 mg via ORAL
  Filled 2020-03-08: qty 2

## 2020-03-08 MED ORDER — ADULT MULTIVITAMIN W/MINERALS CH
1.0000 | ORAL_TABLET | Freq: Every day | ORAL | Status: DC
  Administered 2020-03-09 – 2020-03-10 (×2): 1 via ORAL
  Filled 2020-03-08 (×2): qty 1

## 2020-03-08 MED ORDER — THIAMINE HCL 100 MG/ML IJ SOLN
100.0000 mg | Freq: Every day | INTRAMUSCULAR | Status: DC
  Administered 2020-03-12: 100 mg via INTRAVENOUS
  Filled 2020-03-08: qty 2

## 2020-03-08 MED ORDER — ATORVASTATIN CALCIUM 80 MG PO TABS
80.0000 mg | ORAL_TABLET | Freq: Every day | ORAL | Status: DC
  Administered 2020-03-09: 80 mg via ORAL
  Filled 2020-03-08: qty 1

## 2020-03-08 MED ORDER — LORAZEPAM 2 MG/ML IJ SOLN: 0.0000 mg | Freq: Two times a day (BID) | INTRAMUSCULAR | Status: AC

## 2020-03-08 MED ORDER — LORAZEPAM 2 MG/ML IJ SOLN: 1.0000 mg | INTRAMUSCULAR | Status: AC | PRN

## 2020-03-08 MED ORDER — LORAZEPAM 2 MG/ML IJ SOLN: 0.0000 mg | Freq: Four times a day (QID) | INTRAMUSCULAR | Status: AC

## 2020-03-08 NOTE — Consult Note (Addendum)
Neurology Consultation Reason for Consult: Difficulty speaking, MRI with acute infarcts  Referring Physician: Christia Reading Opyd  CC: " I fell down"  History is obtained from: Patient, chart review, and wife  HPI: [name] [name] is a 74 y.o. right-handed male with a past medical history significant for multiple vascular risk factors (hypertension, hyperlipidemia, diabetes, severe obstructive sleep apnea non-compliant with CPAP), memory changes being followed by [facility] neurology, chronic back pain, abdominal aortic aneurysm status post graft (2014), lung nodule (stable on last CT 12/2017), ongoing marijuana abuse and alcohol use.  He was having some right rib pain last week, for which he presented to urgent care and was prescribed hydrocodone after imaging was unrevealing.  He subsequently had a fall, his second fall in the last few weeks.  He was noted to be quite confused this morning by his wife who discovered his blood sugar was 53.  In the setting of low blood glucose he did have urinary incontinence as well as emesis, but this appears to have resolved.  While his blood glucose improved with administration of juice, his confusion did not, and therefore she activated EMS.  On arrival here he was noted to be stating he that he wanted to pee but had difficulty peeing.  He and his wife just denies previous trouble with urination.  On review of systems patient additionally reports of bifrontal headache 8 out of 10, with no improvement with Tylenol.  He reported his rib pain to me as abdominal pain (likely due to his word finding difficulties), and reported he has been having both diarrhea as well as hard stools for the past 3 weeks.  He told me he was smoking marijuana "2 days per day" repeatedly and then endorsed that he meant 2 days/week once I asked this clarifying question.  He denied any cough, shortness of breath, fevers, chills, blood in his urine or blood in his stool, rashes, swelling, focal weakness  (just generalized weakness just prior to his fall), numbness, tingling, loss of perianal or genital sensation, new neck or back pain (stable chronic pain).  This review of systems is challenged by his aphasia  Regarding his aphasia, his wife reports that this has been ongoing and intermittent for at least a month, and that he presented to [facility] with quite similar symptoms about a month ago without a clear etiology discovered.  LKW: Last night about 9 or 10 PM tPA given?: No, due to out of the window  Past Medical History:  Diagnosis Date  . AAA (abdominal aortic aneurysm) ([place])   . Arthritis    Gout  . Back pain   . CKD (chronic kidney disease)   . Diabetes mellitus without complication ([place])    type 2  . Encephalopathy acute   . GERD (gastroesophageal reflux disease)   . Gout   . Gout   . Hyperlipidemia   . Hypertension   . Lung nodule, solitary 2008   RLL, 8 mm in 2008, 14 mm in 2014  . Memory change   . Polyarthralgia   . Reflux   . 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 ABDOMINAL AORTIC ENDOVASCULAR STENT GRAFT N/A 04/29/2013   Procedure: ABDOMINAL AORTIC ENDOVASCULAR STENT GRAFT -GORE;  Surgeon: Mal Misty, MD;  Location: [facility];  Service: Vascular;  Laterality: N/A;  . COLONOSCOPY  2010  . [place]  . TIBIA FRACTURE SURGERY Left   .  VASECTOMY    . WRIST FRACTURE SURGERY Left 1970     Current Facility-Administered Medications:  .  0.9 %  sodium chloride infusion, , Intravenous, Continuous, Opyd, Ilene Qua, MD  Current Outpatient Medications:  .  acetaminophen (TYLENOL) 500 MG tablet, Take 500 mg by mouth every 6 (six) hours as needed for mild pain or headache. , Disp: , Rfl:  .  allopurinol (ZYLOPRIM) 100 MG tablet, Take 200 mg by mouth daily. , Disp: , Rfl:  .  citalopram (CELEXA) 20 MG tablet, Take 20 mg by mouth daily., Disp: , Rfl:  .  clopidogrel (PLAVIX) 75 MG tablet, Take 75 mg by  mouth daily. , Disp: , Rfl:  .  colestipol (COLESTID) 1 g tablet, Take 1 g by mouth 2 (two) times daily between meals as needed (for diarrhea). , Disp: , Rfl:  .  donepezil (ARICEPT) 10 MG tablet, Take 10 mg by mouth every evening. , Disp: , Rfl:  .  furosemide (LASIX) 20 MG tablet, Take 20 mg by mouth daily as needed for edema., Disp: , Rfl:  .  glimepiride (AMARYL) 1 MG tablet, Take 1 mg by mouth daily with breakfast., Disp: , Rfl:  .  HYDROcodone-homatropine (HYCODAN) 5-1.5 MG/5ML syrup, Take 5 mLs by mouth every 6 (six) hours as needed for cough., Disp: , Rfl:  .  memantine (NAMENDA) 10 MG tablet, Take 10 mg by mouth 2 (two) times daily. , Disp: , Rfl:  .  metoprolol succinate (TOPROL-XL) 100 MG 24 hr tablet, Take 100 mg by mouth daily. Take with or immediately following a meal. , Disp: , Rfl:  .  Multiple Vitamins-Minerals ([employer] SPECTRAVITE MEN 50+) TABS, Take 1 tablet by mouth daily with breakfast., Disp: , Rfl:  .  omeprazole (PRILOSEC) 40 MG capsule, Take 40 mg by mouth daily before breakfast., Disp: , Rfl:  .  oxymetazoline (AFRIN) 0.05 % nasal spray, Place 1 spray into both nostrils 2 (two) times daily as needed for congestion., Disp: , Rfl:  .  rosuvastatin (CRESTOR) 20 MG tablet, Take 20 mg by mouth daily. , Disp: , Rfl:  .  tamsulosin (FLOMAX) 0.4 MG CAPS capsule, Take 0.4 mg by mouth daily., Disp: , Rfl:  .  citalopram (CELEXA) 10 MG tablet, Take 10 mg by mouth daily.  (Patient not taking: Reported on 03/08/2020), Disp: , Rfl:  .  furosemide (LASIX) 40 MG tablet, Take 40 mg by mouth daily. (Patient not taking: Reported on 03/08/2020), Disp: , Rfl:  .  metoprolol succinate (TOPROL-XL) 50 MG 24 hr tablet, Take 50 mg by mouth daily. Take with or immediately following a meal. (Patient not taking: Reported on 03/08/2020), Disp: , Rfl:  .  omeprazole (PRILOSEC) 20 MG capsule, Take 20 mg by mouth daily. (Patient not taking: Reported on 03/08/2020), Disp: , Rfl:    Family History  Problem Relation  Age of Onset  . Diabetes Father   . Stroke Father   . Heart attack Father   . Alcohol abuse Father   . Heart attack Brother        X's 2  . Diabetes Brother   . Hypertension Brother   . Alcohol abuse Brother   . Coronary artery disease Mother   . Diabetes Mother   . Heart attack Mother   . Hypertension Brother   . Alcohol abuse Brother   . Diabetes Sister   . Dementia Maternal Aunt   . Alcohol abuse Maternal Uncle   . Alcohol abuse Paternal Uncle   .  Alcohol abuse Maternal Grandfather   . Alcohol abuse Paternal Grandfather    Social History:  reports that he quit smoking about 36 years ago. His smoking use included cigarettes. He has a 20.00 pack-year smoking history. He has never used smokeless tobacco. He reports current alcohol use of about 12.0 standard drinks of alcohol per week. He reports that he does not use drugs.  Other than marijuana as described above   Exam: Current vital signs: BP 135/66   Pulse 71   Temp 98.9 F (37.2 C) (Oral)   Resp 11   SpO2 96%  Vital signs in last 24 hours: Temp:  [98.9 F (37.2 C)] 98.9 F (37.2 C) (09/07 1613) Pulse Rate:  [59-71] 71 (09/07 1908) Resp:  [11-18] 11 (09/07 1908) BP: (135-160)/(66-85) 135/66 (09/07 1908) SpO2:  [95 %-97 %] 96 % (09/07 1908)   Physical Exam  Constitutional: Appears well-developed and well-nourished.  Psych: Affect appropriate to situation, cooperative, reactive affect Eyes: No scleral injection HENT: No OP obstruction, good dentition MSK: no joint deformities.  Cardiovascular: Normal rate and regular rhythm.  Respiratory: Effort normal, non-labored breathing GI: Soft.  No distension. There is no tenderness.  Skin: Warm dry and intact  Neuro: Mental Status: Patient is awake, alert, oriented to person, place, month, year, and situation. Patient's history is significantly limited by his aphasia.  He has a productive more than a receptive aphasia, and does make some categorical errors (stating  days instead of weeks).  Naming for simple objects is intact (glasses, pen), but he is unable to name low-frequency items such as lenses or stethoscope.  Repetition is intact even for complex sentences.  He is able to follow complex commands with proper sequencing.  No evidence of neglect Cranial Nerves: II: Visual Fields are full. Pupils are equal, round, and reactive to light.  4 to 2 mm III,IV, VI: EOMI without ptosis or diploplia.  V: Facial sensation is symmetric to light touch VII: Facial movement is symmetric.  VIII: hearing is intact to voice X: Uvula elevates symmetrically XI: Shoulder shrug is symmetric. XII: tongue is midline without atrophy or fasciculations.  Motor: Tone is normal in the bilateral upper extremities, rectal tone is also normal.  Bulk is normal. 5/5 strength was present in all four extremities.  Rectal squeeze is normal Sensory: Sensation is symmetric to light touch in the arms and legs.  There is a stocking/glove loss of temperature sensation in the bilateral arms and legs.  There is no sensory level to temperature on the thorax.  Genital and sacral sensation is intact. Deep Tendon Reflexes: 3+ and symmetric in the biceps, brachioradialis, triceps bilaterally in upper extremities.  4 beats of clonus in the left patella, 3+ right patellar reflex, patient has difficulty relaxing at the ankles but on some attempts but does have several beats of clonus bilaterally. Plantars: Toes are upgoing more briskly on the left than the right Cerebellar: Finger-to-nose is intact bilaterally Gait deferred given concern for acute myelopathy  I have reviewed labs in epic and the results pertinent to this consultation are: Creatinine of 1.4 (baseline 0.9) GFR 47 (baseline 86) Otherwise unremarkable CMP Mild leukocytosis to 12.7 Normal PT/INR   I have reviewed the images obtained: Scattered arthrosclerotic disease with significant aortic arch atheroma, severe L ICA stenosis near  it's origen at the bifrucation    MRI brain with ventricular enlargement likely ex-vacuo, moderate microvascular disease changes, scattered infarcts in the distal left MCA territory without clear ADC correlate due  to their peripheral location  CT spine without acute process  01/20/20 EEG report only reviewed: FINDINGS: Posterior dominant background rhythms, which attenuate with eye opening, ranging 7-9 hertz and 30-40 microvolts. No focal, lateralizing, epileptiform activity or seizures are seen. Patient recorded in the awake and drowsy state. EKG channel shows regular rhythm of 55-60 beats per minute. This awake and drowsy EEG demonstrates: -Mild generalized slowing consistent with mild encephalopathy. -No epileptiform discharges or seizures seen.   Impression: This is a 74 year old right-handed gentleman with a severe left internal carotid artery stenosis that likely explains his word finding difficulties due to symptomatic hypoperfusion, thus permissive hypertension is recommended.  His exam is otherwise notable for very brisk reflexes not previously documented, which in the setting of recent falls is concerning for a myelopathy, though could alternatively be secondary to withdrawal from opiates/alcohol (diagnosis of exclusion).   Recommendations:  # Punctate multiterrority (L PCA/MCA watershed) strokes; likely secondary to carotid stenosis  - Stroke labs TSH, ESR, RPR, HgbA1c, fasting lipid panel - MRI brain completed as above  - CTA completed as above  - Frequent neuro checks - Echocardiogram - Carotid dopplers to better characterize L ICA stenosis  - Aspirin - 356m PO, followed by 81 mg daily -- holding until spinal cord is cleared - Consider Plavix 300 mg load with 75 mg daily for 21 - 90 day course; holding pending spinal cord imaging - Atorvastatin 80 mg nightly  - Risk factor modification - Permissive hypertension - Telemetry monitoring; 30 day event monitor on discharge if  no arrythmias captured  - PT consult, OT consult, Speech consult,  - Stroke team to follow  # Hyperreflexia with recent fall and urinary retention  - MRI C- and T-spine  # Hypertension   - Please allow permissive hypertension SBP up to 220 - Avoid anything that may provoke hypotension  # Ethanol abuse  # Chronic memory issues  - thiamine level, empiric supplementation 400 mg q8hr x 3 days then 100 mg daily  - folate supplementation  - B12 level and repletion if indicated  - CIWAS protocol   #Acute kidney injury - Appreciate primary team workup; unlikely to be secondary to obstruction given foley returned only 150 mL when placed per nursing  SLesleigh NoeMD-PhD [facility] [phone] Addendum:  MRI C- and T-spine completed, personally reviewed, agree with radiology read that there is no acute process.  Utox positive for opiates (prescribed for rib pain) and cannabinoids (known user)  Addended for charge capture

## 2020-03-08 NOTE — ED Triage Notes (Signed)
Patient arrives via EMS with several complaints. Per ems, the patients wife initially called EMS due to him having worsening confusion, forgetting words, and falling out of bed. Wife called the EMS/FD who arrived and checked patient glucose, CBG was 52. The patient was given juice, oral glucose and ginger ale, increasing his CBG 207.    EMS also noted that patient had several episodes of projectile vomiting en route.   Patient has a history of memory loss and electrolyte imbalances.  Vital Signs with EMS 98.4 Temp 136/74 68 HR 95%

## 2020-03-08 NOTE — ED Notes (Signed)
Pt to CT

## 2020-03-08 NOTE — ED Notes (Signed)
Urine culture sent down with u/a 

## 2020-03-08 NOTE — ED Provider Notes (Signed)
Medical screening examination/treatment/procedure(s) were conducted as a shared visit with non-physician practitioner(s) and myself.  I personally evaluated the patient during the encounter. Briefly, the patient is a 74 y.o. male with history of high cholesterol, diabetes, stroke on Plavix who presents to emergency department with confusion.  Normal vitals, no fever.  Was last seen normal at 9 PM yesterday.  Was found this morning 11/12:00 slightly confused difficulty with speech.  However blood sugar was 52 patient was given orange juice and blood sugar improved to the 200s.  However wife states that he still had speech issues and confusion.  He had a syncopal event after that as well.  Blood sugar with EMS is normal.  Blood sugar upon arrival here is normal.  I was asked to evaluate the patient about an hour after he arrived and he still appears to have some aphasia/confusion with speech.  He appears to have normal strength and sensation throughout, cranial nerves appear intact.  Visual acuity is intact.  He has already had lab work that shows a creatinine of 1.46 but otherwise unremarkable labs.  CT scan of the head and neck has been done that is also unremarkable.  However will add a CTA of his head and neck and get an MRI to evaluate for stroke.  EKG shows sinus rhythm.  No obvious ischemic changes.  Patient outside the window for TPA.  Sugar was rechecked upon my evaluation and is in the 170s.  We will proceed with stroke work-up.  Patient with extensive atherosclerotic disease on CTA imaging.  MRI brain shows acute stroke and this was relayed to me on the phone by radiology.  Patient remains hemodynamically stable.  Will admit to medicine for further stroke care.  I have talked to neurology about the patient as well.  This chart was dictated using voice recognition software.  Despite best efforts to proofread,  errors can occur which can change the documentation meaning.     EKG  Interpretation  Date/Time:  Tuesday March 08 2020 16:10:33 EDT Ventricular Rate:  61 PR Interval:    QRS Duration: 94 QT Interval:  431 QTC Calculation: 435 R Axis:   -40 Text Interpretation: Sinus rhythm Left axis deviation Borderline T wave abnormalities Confirmed by Lennice Sites [phone]) on 03/08/2020 5:04:47 PM       .Critical Care Performed by: Lennice Sites, DO Authorized by: Lennice Sites, DO   Critical care provider statement:    Critical care time (minutes):  45   Critical care was necessary to treat or prevent imminent or life-threatening deterioration of the following conditions:  CNS failure or compromise   Critical care was time spent personally by me on the following activities:  Blood draw for specimens, development of treatment plan with patient or surrogate, discussions with consultants, discussions with primary provider, evaluation of patient's response to treatment, examination of patient, obtaining history from patient or surrogate, ordering and performing treatments and interventions, ordering and review of laboratory studies, ordering and review of radiographic studies, pulse oximetry, re-evaluation of patient's condition and review of old charts   I assumed direction of critical care for this patient from another provider in my specialty: no         Lennice Sites, DO 03/08/20 2117

## 2020-03-08 NOTE — ED Notes (Signed)
Patient transported to MRI 

## 2020-03-08 NOTE — ED Provider Notes (Signed)
[facility] EMERGENCY DEPARTMENT Provider Note   CSN: [id number] Arrival date & time: 03/08/20  1558     History Chief Complaint  Patient presents with  . Hypoglycemia  . Fall    [name] [name] is a 74 y.o. male with past medical history significant for AAA, CKD, type 2 diabetes, hypertension, hyperlipidemia, memory loss, cva. Anticoagulation on plavix. Last known normal at 9pm last night  HPI Patient presents to emergency department today via EMS with chief complaint of hypoglycemia and fall.  Patient is confused on arrival.  Unable to provide any history.  Level five caveat applies for altered mental status.  History obtained from EMS and patient's spouse over the phone.  Spouse states patient went to bed at 9 pm last night. He was talking and his usual self. He typically sleeps in late and she did not see him again until noon today. She thought he sounded confused, she checked his blood sugar and it was 52.  She gave him some orange juice and blood sugar increased to the low 200s.  Later when patient was trying to stand up to get out of bed he fell and hit his head on the floor.  She denies any loss of consciousness.  She states while he was laying on the floor he was unable to talk to her.  Patient then had one episode of projectile emesis in the ambulance in route. No medications for symptoms prior to arrival.  Spouse states he has a history of memory loss.  He has not been formally diagnosed with dementia or Alzheimer's.  She states he is typically alert and oriented to his self, location and date.  She denies any recent illness.    Past Medical History:  Diagnosis Date  . AAA (abdominal aortic aneurysm) ([place])   . Arthritis    Gout  . Back pain   . CKD (chronic kidney disease)   . Diabetes mellitus without complication ([place])    type 2  . Encephalopathy acute   . GERD (gastroesophageal reflux disease)   . Gout   . Gout   . Hyperlipidemia   . Hypertension     . Lung nodule, solitary 2008   RLL, 8 mm in 2008, 14 mm in 2014  . Memory change   . Polyarthralgia   . Reflux   . Stroke [facility])    hx of multiple    Patient Active Problem List   Diagnosis Date Noted  . Lung nodule, solitary 07/06/2014  . 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 AAA (abdominal aortic aneurysm) without rupture ([place]) 04/07/2013  . Abdominal aneurysm without mention of rupture 03/18/2012    Past Surgical History:  Procedure Laterality Date  . ABDOMINAL AORTIC ENDOVASCULAR STENT GRAFT N/A 04/29/2013   Procedure: ABDOMINAL AORTIC ENDOVASCULAR STENT GRAFT -GORE;  Surgeon: Mal Misty, MD;  Location: [facility];  Service: Vascular;  Laterality: N/A;  . COLONOSCOPY  2010  . [place]  . TIBIA FRACTURE SURGERY Left   . VASECTOMY    . WRIST FRACTURE SURGERY Left 1970       Family History  Problem Relation Age of Onset  . Diabetes Father   . Stroke Father   . Heart attack Father   . Alcohol abuse Father   . Heart attack Brother        X's 2  . Diabetes Brother   . Hypertension Brother   . Alcohol abuse Brother   . Coronary  artery disease Mother   . Diabetes Mother   . Heart attack Mother   . Hypertension Brother   . Alcohol abuse Brother   . Diabetes Sister   . Dementia Maternal Aunt   . Alcohol abuse Maternal Uncle   . Alcohol abuse Paternal Uncle   . Alcohol abuse Maternal Grandfather   . Alcohol abuse Paternal Grandfather     Social History   Tobacco Use  . Smoking status: Former Smoker    Packs/day: 1.00    Years: 20.00    Pack years: 20.00    Types: Cigarettes    Quit date: 07/03/1983    Years since quitting: 36.7  . Smokeless tobacco: Never Used  Substance Use Topics  . Alcohol use: Yes    Alcohol/week: 12.0 standard drinks    Types: 12 Cans of beer per week    Comment: 08/06/18 18 pk/week  . Drug use: No    Home Medications Prior to Admission medications   Medication Sig Start Date End Date Taking?  Authorizing Provider  acetaminophen (TYLENOL) 500 MG tablet Take 500 mg by mouth every 6 (six) hours as needed.    [provider]  allopurinol (ZYLOPRIM) 100 MG tablet Take 200 mg by mouth daily.     [provider]  citalopram (CELEXA) 10 MG tablet TK 1 T PO D FOR MOOD 08/07/16   [provider]  clopidogrel (PLAVIX) 75 MG tablet 75 mg daily. 12/15/19   [provider]  colestipol (COLESTID) 1 g tablet  12/10/19   [provider]  donepezil (ARICEPT) 10 MG tablet 10 mg daily. 12/16/19   [provider]  furosemide (LASIX) 40 MG tablet Take 40 mg by mouth daily.    [provider]  glimepiride (AMARYL) 1 MG tablet Take 1 mg by mouth daily with breakfast.    [provider]  memantine (NAMENDA) 10 MG tablet 10 mg daily. 12/16/19   [provider]  metoprolol succinate (TOPROL-XL) 50 MG 24 hr tablet Take 50 mg by mouth daily. Take with or immediately following a meal.    [provider]  omeprazole (PRILOSEC) 20 MG capsule Take 20 mg by mouth daily.    [provider]  rosuvastatin (CRESTOR) 20 MG tablet Take 10 mg by mouth daily.     [provider]  tamsulosin (FLOMAX) 0.4 MG CAPS capsule Take 0.4 mg by mouth daily.    [provider]    Allergies    Patient has no known allergies.  Review of Systems   Review of Systems  Unable to perform ROS: Mental status change    Physical Exam Updated Vital Signs BP 140/85 (BP Location: Left Arm)   Pulse 65   Temp 98.9 F (37.2 C) (Oral)   Resp 13   SpO2 95%   Physical Exam Vitals and nursing note reviewed.  Constitutional:      General: He is not in acute distress.    Appearance: He is not ill-appearing.  HENT:     Head: Normocephalic and atraumatic.     Right Ear: Tympanic membrane and external ear normal.     Left Ear: Tympanic membrane and external ear normal.     Nose: Nose normal.     Mouth/Throat:     Mouth: Mucous  membranes are moist.     Pharynx: Oropharynx is clear.  Eyes:     General: No scleral icterus.       Right eye: No discharge.  Left eye: No discharge.     Extraocular Movements: Extraocular movements intact.     Conjunctiva/sclera: Conjunctivae normal.     Pupils: Pupils are equal, round, and reactive to light.  Neck:     Vascular: No JVD.  Cardiovascular:     Rate and Rhythm: Normal rate and regular rhythm.     Pulses: Normal pulses.          Radial pulses are 2+ on the right side and 2+ on the left side.     Heart sounds: Normal heart sounds.  Pulmonary:     Comments: Lungs clear to auscultation in all fields. Symmetric chest rise. No wheezing, rales, or rhonchi. Abdominal:     Comments: Abdomen is soft, non-distended, and non-tender in all quadrants. No rigidity, no guarding. No peritoneal signs.  Musculoskeletal:        General: Normal range of motion.     Cervical back: Normal range of motion.  Skin:    General: Skin is warm and dry.     Capillary Refill: Capillary refill takes less than 2 seconds.  Neurological:     GCS: GCS eye subscore is 4. GCS verbal subscore is 5. GCS motor subscore is 6.     Comments: Fluent speech, no facial droop.  CN 2-12 inact.No visual field deficits Patient alert to self only.  He thinks the year is 8 and does not know where he is. He follows commands.  Strength equal in bilateral upper and lower extremities. Sensation intact to light and sharp touch.  Psychiatric:        Behavior: Behavior normal.     ED Results / Procedures / Treatments   Labs (all labs ordered are listed, but only abnormal results are displayed) Labs Reviewed  CBC WITH DIFFERENTIAL/PLATELET - Abnormal; Notable for the following components:      Result Value   WBC 12.7 (*)    Neutro Abs 10.6 (*)    All other components within normal limits  COMPREHENSIVE METABOLIC PANEL - Abnormal; Notable for the following components:   Glucose, Bld 191 (*)    Creatinine,  Ser 1.46 (*)    GFR calc non Af Amer 47 (*)    GFR calc Af Amer 54 (*)    All other components within normal limits  CBG MONITORING, ED - Abnormal; Notable for the following components:   Glucose-Capillary 190 (*)    All other components within normal limits  CBG MONITORING, ED - Abnormal; Notable for the following components:   Glucose-Capillary 173 (*)    All other components within normal limits  SARS CORONAVIRUS 2 BY RT PCR (HOSPITAL ORDER, [facility])  LIPASE, BLOOD  PROTIME-INR  ETHANOL  URINALYSIS, ROUTINE W REFLEX MICROSCOPIC  RAPID URINE DRUG SCREEN, HOSP PERFORMED    EKG EKG Interpretation  Date/Time:  Tuesday March 08 2020 16:10:33 EDT Ventricular Rate:  61 PR Interval:    QRS Duration: 94 QT Interval:  431 QTC Calculation: 435 R Axis:   -40 Text Interpretation: Sinus rhythm Left axis deviation Borderline T wave abnormalities Confirmed by Lennice Sites [phone]) on 03/08/2020 5:04:47 PM   Radiology CT Head Wo Contrast  Result Date: 03/08/2020 CLINICAL DATA:  Head trauma, minor (Age >= 65y) Post fall with vomiting. EXAM: CT HEAD WITHOUT CONTRAST TECHNIQUE: Contiguous axial images were obtained from the base of the skull through the vertex without intravenous contrast. COMPARISON:  Head CT 12/05/2019, brain MRI 12/07/2019 FINDINGS: Brain: No acute hemorrhage or evidence  of acute ischemia. Stable degree of atrophy, ventricular dilatation, and periventricular chronic small vessel ischemia. Remote lacunar infarcts in the left greater than right basal ganglia. No midline shift or mass effect. No subdural or extra-axial collection. Vascular: Atherosclerosis of skullbase vasculature without hyperdense vessel or abnormal calcification. Skull: No fracture or focal lesion. Sinuses/Orbits: Minimal chronic mucosal thickening paranasal sinuses. No sinus fluid level or acute findings. The mastoid air cells are clear. Orbits are unremarkable. Other: None.  IMPRESSION: 1. No acute intracranial abnormality. No skull fracture. 2. Stable atrophy, chronic small vessel ischemia, and remote basal gangliar lacunar infarcts. Electronically Signed   By: Keith Rake M.D.   On: 03/08/2020 17:00   CT Cervical Spine Wo Contrast  Result Date: 03/08/2020 CLINICAL DATA:  Neck trauma (Age >= 65y) Post fall with vomiting. EXAM: CT CERVICAL SPINE WITHOUT CONTRAST TECHNIQUE: Multidetector CT imaging of the cervical spine was performed without intravenous contrast. Multiplanar CT image reconstructions were also generated. COMPARISON:  None. FINDINGS: Alignment: Normal. Skull base and vertebrae: No acute fracture. Vertebral body heights are maintained. The dens and skull base are intact. Soft tissues and spinal canal: No prevertebral fluid or swelling. No visible canal hematoma. Disc levels: Disc space narrowing and endplate spurring at S8-N4. There is multilevel endplate spurring at additional levels with preservation of disc spaces. Facet hypertrophy at C4-C5 on the left and multilevel on the right. Upper chest: Breathing motion artifact. No evidence of acute abnormality. Other: Carotid calcifications. IMPRESSION: Degenerative change in the cervical spine without acute fracture or subluxation. Electronically Signed   By: Keith Rake M.D.   On: 03/08/2020 17:04    Procedures Procedures (including critical care time)  Medications Ordered in ED Medications  ondansetron (ZOFRAN) injection 4 mg (4 mg Intravenous Given 03/08/20 1700)  iohexol (OMNIPAQUE) 350 MG/ML injection 50 mL (50 mLs Intravenous Contrast Given 03/08/20 1822)    ED Course  I have reviewed the triage vital signs and the nursing notes.  Pertinent labs & imaging results that were available during my care of the patient were reviewed by me and considered in my medical decision making (see chart for details).  Clinical Course as of Mar 08 1824  Tue Mar 08, 2020  1639 No hypoglycemia on ED arrival   [facility](!): 190 [KA]  1728 Patient assessed by ED attending. He is having difficulty word finding, answers questions inappropriately. Glucose rechecked and is 170. MRI brain, ct angio head and neck ordered.   [KA]    Clinical Course User Index [KA] Birdella Sippel, Harley Hallmark, PA-C   MDM Rules/Calculators/A&P                          History provided by patient with additional history obtained from chart review.    Patient seen and examined. Patient presents awake, hemodynamically stable, afebrile, non toxic. He is alert to self only.  Speech is clear, he has equal strength in all extremities. Sensation intact. CN 2-12 normal. Glucose on arrival is 190. Work up shows nonspecific leukocytosis 12.7, normal hemoglobin. No significant electrolyte derangement, creatinine slightly elevated compared to prior, todat Cr 1.46 with baseline around 1. BUN normal. No transaminitis. INR normal. Ethanol is negative. Ethanol UA and UDS not yet collected. Covid test in process.  Ct head and neck negative for acute infarct, fractures, or dislocations. When reassessing patient with ED attending he is having difficulty word findingd and answers questions inappropriately. Wife reports last known normal was 9pm last night.  He is outside stroke window. Glucose rechecked and it 170.  MR brain and CTA head and neck ordered.  Patient care transferred to Dr. Ronnald Nian at the end of my shift pending imaging. Patient presentation, ED course, and plan of care discussed with review of all pertinent labs and imaging. Please see his note for further details regarding further ED course and disposition.    Portions of this note were generated with [employer]. Dictation errors may occur despite best attempts at proofreading.    Final Clinical Impression(s) / ED Diagnoses Final diagnoses:  Fall, initial encounter  Hypoglycemia    Rx / DC Orders ED Discharge Orders    None       Cherre Robins, PA-C 03/08/20 1825    Lennice Sites, DO 03/08/20 1902

## 2020-03-08 NOTE — H&P (Signed)
History and Physical    [name] [name] QAS:[id number] DOB: 03/25/1946 DOA: 03/08/2020  PCP: [name], MD   Patient coming from: Home   Chief Complaint: Confusion, fall, hypoglycemia   HPI: [name] [name] is a 74 y.o. male with medical history significant for memory loss, chronic kidney disease, type 2 diabetes mellitus, AAA, hypertension, and hyperlipidemia, now presenting to the emergency department for evaluation of confusion, hypoglycemia, and a fall. Patient reports that he was in his usual state of health throughout the day yesterday but last night began to feel drowsy and generally poor but without fevers, chills, headache, chest pain, palpitations, or shortness of breath at that time. He continued to feel sleepy today, did not have any appetite, and then fell in the bathroom, hitting his head but without losing consciousness. His wife noted that he seemed to be having difficulty finding the correct words to use and seemed to be confused. EMS was called. CBG was 52, improving with juice, but the speech difficulty and confusion persisted. Patient reports a headache that he attributes to the fall, but notes that it resolved with acetaminophen. He denies any focal numbness or weakness.  ED Course: Upon arrival to the ED, patient is found to be afebrile, saturating well on room air, and with stable blood pressure. EKG features a sinus rhythm with left axis deviation. Noncontrast head CT is negative for acute intracranial abnormality. Cervical spine CT negative for acute fracture or subluxation. CTA head and neck with extensive atheromatous disease involving the aorta and branch vessels, 30% narrowing of the innominate artery, 50% narrowing left carotid bulb, and 80 to 90% narrowing involving the proximal left ICA. MRI brain is concerning for small acute infarctions involving the left frontal and occipital lobes. Chemistry panel notable for glucose 191 and creatinine 1.46. CBC with a leukocytosis to  12,700. COVID-19 screening test was negative. Patient was treated with Zofran and acetaminophen in the ED and neurology was consulted by the ED physician.  Review of Systems:  All other systems reviewed and apart from HPI, are negative.  Past Medical History:  Diagnosis Date  . AAA (abdominal aortic aneurysm) ([place])   . Arthritis    Gout  . Back pain   . CKD (chronic kidney disease)   . Diabetes mellitus without complication ([place])    type 2  . Encephalopathy acute   . GERD (gastroesophageal reflux disease)   . Gout   . Gout   . Hyperlipidemia   . Hypertension   . Lung nodule, solitary 2008   RLL, 8 mm in 2008, 14 mm in 2014  . Memory change   . Polyarthralgia   . Reflux   . Stroke [facility])    hx of multiple    Past Surgical History:  Procedure Laterality Date  . ABDOMINAL AORTIC ENDOVASCULAR STENT GRAFT N/A 04/29/2013   Procedure: ABDOMINAL AORTIC ENDOVASCULAR STENT GRAFT -GORE;  Surgeon: Mal Misty, MD;  Location: [facility];  Service: Vascular;  Laterality: N/A;  . COLONOSCOPY  2010  . [place]  . TIBIA FRACTURE SURGERY Left   . VASECTOMY    . WRIST FRACTURE SURGERY Left 1970    Social History:   reports that he quit smoking about 36 years ago. His smoking use included cigarettes. He has a 20.00 pack-year smoking history. He has never used smokeless tobacco. He reports current alcohol use of about 12.0 standard drinks of alcohol per week. He reports that he does not use drugs.  No Known Allergies  Family History  Problem Relation Age of Onset  . Diabetes Father   . Stroke Father   . Heart attack Father   . Alcohol abuse Father   . Heart attack Brother        X's 2  . Diabetes Brother   . Hypertension Brother   . Alcohol abuse Brother   . Coronary artery disease Mother   . Diabetes Mother   . Heart attack Mother   . Hypertension Brother   . Alcohol abuse Brother   . Diabetes Sister   . Dementia Maternal Aunt   . Alcohol abuse Maternal  Uncle   . Alcohol abuse Paternal Uncle   . Alcohol abuse Maternal Grandfather   . Alcohol abuse Paternal Grandfather      Prior to Admission medications   Medication Sig Start Date End Date Taking? Authorizing Provider  acetaminophen (TYLENOL) 500 MG tablet Take 500 mg by mouth every 6 (six) hours as needed.    [provider]  allopurinol (ZYLOPRIM) 100 MG tablet Take 200 mg by mouth daily.     [provider]  citalopram (CELEXA) 10 MG tablet TK 1 T PO D FOR MOOD 08/07/16   [provider]  clopidogrel (PLAVIX) 75 MG tablet 75 mg daily. 12/15/19   [provider]  colestipol (COLESTID) 1 g tablet  12/10/19   [provider]  donepezil (ARICEPT) 10 MG tablet 10 mg daily. 12/16/19   [provider]  furosemide (LASIX) 40 MG tablet Take 40 mg by mouth daily.    [provider]  glimepiride (AMARYL) 1 MG tablet Take 1 mg by mouth daily with breakfast.    [provider]  memantine (NAMENDA) 10 MG tablet 10 mg daily. 12/16/19   [provider]  metoprolol succinate (TOPROL-XL) 50 MG 24 hr tablet Take 50 mg by mouth daily. Take with or immediately following a meal.    [provider]  omeprazole (PRILOSEC) 20 MG capsule Take 20 mg by mouth daily.    [provider]  rosuvastatin (CRESTOR) 20 MG tablet Take 10 mg by mouth daily.     [provider]  tamsulosin (FLOMAX) 0.4 MG CAPS capsule Take 0.4 mg by mouth daily.    [provider]    Physical Exam: Vitals:   03/08/20 1613 03/08/20 1630 03/08/20 1908  BP: 140/85 (!) 160/67 135/66  Pulse: 65 (!) 59 71  Resp: 13 18 11   Temp: 98.9 F (37.2 C)    TempSrc: Oral    SpO2: 95% 97% 96%    Constitutional: NAD, calm  Eyes: PERTLA, lids and conjunctivae normal ENMT: Mucous membranes are moist. Posterior pharynx clear of any exudate or lesions.   Neck: normal, supple, no masses, no thyromegaly Respiratory:  no wheezing, no crackles.  No accessory muscle use.  Cardiovascular: S1 & S2 heard, regular rate and rhythm. No extremity edema.   Abdomen: No distension, no tenderness, soft. Bowel sounds active.  Musculoskeletal: no clubbing / cyanosis. No joint deformity upper and lower extremities.   Skin: no significant rashes, lesions, ulcers. Warm, dry, well-perfused. Neurologic: CN 2-12 grossly intact. Sensation intact. Strength 5/5 in all 4 limbs.  Psychiatric: Sleeping, wakes to voice. Oriented to person, place, and situation. Pleasant and cooperative.    Labs and Imaging on Admission: I have personally reviewed following labs and imaging studies  CBC: Recent Labs  Lab 03/08/20 1613  WBC 12.7*  NEUTROABS 10.6*  HGB 14.0  HCT  41.7  MCV 91.0  PLT 628   Basic Metabolic Panel: Recent Labs  Lab 03/08/20 1613  NA 136  K 4.2  CL 102  CO2 24  GLUCOSE 191*  BUN 20  CREATININE 1.46*  CALCIUM 9.7   GFR: CrCl cannot be calculated (Unknown ideal weight.). Liver Function Tests: Recent Labs  Lab 03/08/20 1613  AST 39  ALT 30  ALKPHOS 120  BILITOT 0.7  PROT 6.7  ALBUMIN 3.9   Recent Labs  Lab 03/08/20 1613  LIPASE 28   No results for input(s): AMMONIA in the last 168 hours. Coagulation Profile: Recent Labs  Lab 03/08/20 1613  INR 0.9   Cardiac Enzymes: No results for input(s): CKTOTAL, CKMB, CKMBINDEX, TROPONINI in the last 168 hours. BNP (last 3 results) No results for input(s): PROBNP in the last 8760 hours. HbA1C: No results for input(s): HGBA1C in the last 72 hours. CBG: Recent Labs  Lab 03/08/20 1608 03/08/20 1727  GLUCAP 190* 173*   Lipid Profile: No results for input(s): CHOL, HDL, LDLCALC, TRIG, CHOLHDL, LDLDIRECT in the last 72 hours. Thyroid Function Tests: No results for input(s): TSH, T4TOTAL, FREET4, T3FREE, THYROIDAB in the last 72 hours. Anemia Panel: No results for input(s): VITAMINB12, FOLATE, FERRITIN, TIBC, IRON, RETICCTPCT in the last 72 hours. Urine analysis:      Component Value Date/Time   COLORURINE YELLOW 04/24/2013 1102   APPEARANCEUR CLEAR 04/24/2013 1102   LABSPEC 1.007 04/24/2013 1102   PHURINE 7.0 04/24/2013 1102   GLUCOSEU NEGATIVE 04/24/2013 1102   HGBUR NEGATIVE 04/24/2013 1102   BILIRUBINUR NEGATIVE 04/24/2013 1102   KETONESUR NEGATIVE 04/24/2013 1102   PROTEINUR NEGATIVE 04/24/2013 1102   UROBILINOGEN 0.2 04/24/2013 1102   NITRITE NEGATIVE 04/24/2013 1102   LEUKOCYTESUR NEGATIVE 04/24/2013 1102   Sepsis Labs: @LABRCNTIP (procalcitonin:4,lacticidven:4) ) Recent Results (from the past 240 hour(s))  SARS Coronavirus 2 by RT PCR (hospital order, performed in [place] hospital lab) Nasopharyngeal Nasopharyngeal Swab     Status: None   Collection Time: 03/08/20  5:03 PM   Specimen: Nasopharyngeal Swab  Result Value Ref Range Status   SARS Coronavirus 2 NEGATIVE NEGATIVE Final    Comment: (NOTE) SARS-CoV-2 target nucleic acids are NOT DETECTED.  The SARS-CoV-2 RNA is generally detectable in upper and lower respiratory specimens during the acute phase of infection. The lowest concentration of SARS-CoV-2 viral copies this assay can detect is 250 copies / mL. A negative result does not preclude SARS-CoV-2 infection and should not be used as the sole basis for treatment or other patient management decisions.  A negative result may occur with improper specimen collection / handling, submission of specimen other than nasopharyngeal swab, presence of viral mutation(s) within the areas targeted by this assay, and inadequate number of viral copies (<250 copies / mL). A negative result must be combined with clinical observations, patient history, and epidemiological information.  Fact Sheet for Patients:   StrictlyIdeas.no  Fact Sheet for Healthcare Providers: BankingDealers.co.za  This test is not yet approved or  cleared by the [place] FDA and has been authorized for detection  and/or diagnosis of SARS-CoV-2 by FDA under an Emergency Use Authorization (EUA).  This EUA will remain in effect (meaning this test can be used) for the duration of the COVID-19 declaration under Section 564(b)(1) of the Act, 21 U.S.C. section 360bbb-3(b)(1), unless the authorization is terminated or revoked sooner.  Performed at [facility], [place] [address], [place],  [address]      Radiological Exams  on Admission: CT Angio Head W or Wo Contrast  Result Date: 03/08/2020 CLINICAL DATA:  Neuro deficit EXAM: CT ANGIOGRAPHY HEAD AND NECK TECHNIQUE: Multidetector CT imaging of the head and neck was performed using the standard protocol during bolus administration of intravenous contrast. Multiplanar CT image reconstructions and MIPs were obtained to evaluate the vascular anatomy. Carotid stenosis measurements (when applicable) are obtained utilizing NASCET criteria, using the distal internal carotid diameter as the denominator. CONTRAST:  24mL OMNIPAQUE IOHEXOL 350 MG/ML SOLN COMPARISON:  03/08/2020 head CT and prior. FINDINGS: CTA NECK FINDINGS Aortic arch: Standard branching. Imaged portion shows no evidence of aneurysm or dissection. Extensive noncalcified atheromatous plaque involving the aortic arch and great vessel origins. Some of the plaque demonstrates a pedunculated appearance. Approximately 30% luminal narrowing of the innominate artery secondary to atheromatous disease. No significant stenosis of the left common carotid and left subclavian artery origins. Right carotid system: Noncalcified atheromatous disease involving the proximal right common carotid artery without significant luminal narrowing. Carotid bifurcation calcified atheromatous plaque without high-grade narrowing. Mild narrowing of the proximal right petrous segment secondary to noncalcified atheromatous plaque. Left carotid system: Patent normal caliber common carotid artery. Carotid bifurcation atherosclerotic  calcifications with approximately 80-90% narrowing of the proximal ICA. The carotid bulb is narrowed by approximately 50%. Vertebral arteries: Dominant right vertebral artery. Right V1 and V4 segment calcified atheromatous plaque with mild narrowing. Left vertebral artery atheromatous disease with no greater than mild narrowing. Skeleton: Multilevel spondylosis.  No acute osseous abnormalities. Other neck: No adenopathy.  No soft tissue mass. Upper chest: Emphysema. Dependent atelectasis. Motion artifact limits evaluation. Review of the MIP images confirms the above findings CTA HEAD FINDINGS Anterior circulation: Bilateral carotid siphon atherosclerotic calcifications. Mild narrowing of the left paraclinoid ICA. Patent, normal caliber appearance of the anterior and middle cerebral arteries. Posterior circulation: Dominant right vertebral artery. Normal caliber patent basilar, superior cerebellar and posterior cerebral arteries. Venous sinuses: As permitted by contrast timing, patent. Anatomic variants: None. 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 30% luminal narrowing of the innominate secondary to atheromatous plaque. Approximately 50% narrowing of the left carotid bulb and 80-90% narrowing of the proximal left ICA secondary to atheromatous plaque. Mild right V1 and V4 segment narrowing secondary to atheromatous disease. Mild narrowing of the proximal right petrous and left paraclinoid ICA. Electronically Signed   By: Primitivo Gauze M.D.   On: 03/08/2020 19:00   CT Head Wo Contrast  Result Date: 03/08/2020 CLINICAL DATA:  Head trauma, minor (Age >= 65y) Post fall with vomiting. EXAM: CT HEAD WITHOUT CONTRAST TECHNIQUE: Contiguous axial images were obtained from the base of the skull through the vertex without intravenous contrast. COMPARISON:  Head CT 12/05/2019, brain MRI 12/07/2019 FINDINGS: Brain: No  acute hemorrhage or evidence of acute ischemia. Stable degree of atrophy, ventricular dilatation, and periventricular chronic small vessel ischemia. Remote lacunar infarcts in the left greater than right basal ganglia. No midline shift or mass effect. No subdural or extra-axial collection. Vascular: Atherosclerosis of skullbase vasculature without hyperdense vessel or abnormal calcification. Skull: No fracture or focal lesion. Sinuses/Orbits: Minimal chronic mucosal thickening paranasal sinuses. No sinus fluid level or acute findings. The mastoid air cells are clear. Orbits are unremarkable. Other: None. IMPRESSION: 1. No acute intracranial abnormality. No skull fracture. 2. Stable atrophy, chronic small vessel ischemia, and remote basal gangliar lacunar infarcts. Electronically Signed   By: Aurther Loft.D.  On: 03/08/2020 17:00   CT Angio Neck W and/or Wo Contrast  Result Date: 03/08/2020 CLINICAL DATA:  Neuro deficit EXAM: CT ANGIOGRAPHY HEAD AND NECK TECHNIQUE: Multidetector CT imaging of the head and neck was performed using the standard protocol during bolus administration of intravenous contrast. Multiplanar CT image reconstructions and MIPs were obtained to evaluate the vascular anatomy. Carotid stenosis measurements (when applicable) are obtained utilizing NASCET criteria, using the distal internal carotid diameter as the denominator. CONTRAST:  28mL OMNIPAQUE IOHEXOL 350 MG/ML SOLN COMPARISON:  03/08/2020 head CT and prior. FINDINGS: CTA NECK FINDINGS Aortic arch: Standard branching. Imaged portion shows no evidence of aneurysm or dissection. Extensive noncalcified atheromatous plaque involving the aortic arch and great vessel origins. Some of the plaque demonstrates a pedunculated appearance. Approximately 30% luminal narrowing of the innominate artery secondary to atheromatous disease. No significant stenosis of the left common carotid and left subclavian artery origins. Right carotid system:  Noncalcified atheromatous disease involving the proximal right common carotid artery without significant luminal narrowing. Carotid bifurcation calcified atheromatous plaque without high-grade narrowing. Mild narrowing of the proximal right petrous segment secondary to noncalcified atheromatous plaque. Left carotid system: Patent normal caliber common carotid artery. Carotid bifurcation atherosclerotic calcifications with approximately 80-90% narrowing of the proximal ICA. The carotid bulb is narrowed by approximately 50%. Vertebral arteries: Dominant right vertebral artery. Right V1 and V4 segment calcified atheromatous plaque with mild narrowing. Left vertebral artery atheromatous disease with no greater than mild narrowing. Skeleton: Multilevel spondylosis.  No acute osseous abnormalities. Other neck: No adenopathy.  No soft tissue mass. Upper chest: Emphysema. Dependent atelectasis. Motion artifact limits evaluation. Review of the MIP images confirms the above findings CTA HEAD FINDINGS Anterior circulation: Bilateral carotid siphon atherosclerotic calcifications. Mild narrowing of the left paraclinoid ICA. Patent, normal caliber appearance of the anterior and middle cerebral arteries. Posterior circulation: Dominant right vertebral artery. Normal caliber patent basilar, superior cerebellar and posterior cerebral arteries. Venous sinuses: As permitted by contrast timing, patent. Anatomic variants: None. 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 30% luminal narrowing of the innominate secondary to atheromatous plaque. Approximately 50% narrowing of the left carotid bulb and 80-90% narrowing of the proximal left ICA secondary to atheromatous plaque. Mild right V1 and V4 segment narrowing secondary to atheromatous disease. Mild narrowing of the proximal right petrous and left paraclinoid ICA. Electronically Signed    By: Primitivo Gauze M.D.   On: 03/08/2020 19:00   CT Cervical Spine Wo Contrast  Result Date: 03/08/2020 CLINICAL DATA:  Neck trauma (Age >= 65y) Post fall with vomiting. EXAM: CT CERVICAL SPINE WITHOUT CONTRAST TECHNIQUE: Multidetector CT imaging of the cervical spine was performed without intravenous contrast. Multiplanar CT image reconstructions were also generated. COMPARISON:  None. FINDINGS: Alignment: Normal. Skull base and vertebrae: No acute fracture. Vertebral body heights are maintained. The dens and skull base are intact. Soft tissues and spinal canal: No prevertebral fluid or swelling. No visible canal hematoma. Disc levels: Disc space narrowing and endplate spurring at V9-D6. There is multilevel endplate spurring at additional levels with preservation of disc spaces. Facet hypertrophy at C4-C5 on the left and multilevel on the right. Upper chest: Breathing motion artifact. No evidence of acute abnormality. Other: Carotid calcifications. IMPRESSION: Degenerative change in the cervical spine without acute fracture or subluxation. Electronically Signed   By: Keith Rake M.D.   On: 03/08/2020 17:04   MR BRAIN WO CONTRAST  Result  Date: 03/08/2020 CLINICAL DATA:  Neuro deficit EXAM: MRI HEAD WITHOUT CONTRAST TECHNIQUE: Multiplanar, multiecho pulse sequences of the brain and surrounding structures were obtained without intravenous contrast. COMPARISON:  Concurrent CTA head and neck. 12/07/2019 MRI head and prior. FINDINGS: Brain: Moderate cerebral atrophy with ex vacuo dilatation. Scattered and confluent T2/FLAIR hyperintense foci involving the periventricular and subcortical white matter are nonspecific however commonly associated with chronic microvascular ischemic changes. Small foci of restricted diffusion involving the left frontal lobe at the vertex and left occipital lobe (2:20, 44). No intracranial hemorrhage. No midline shift, mass lesion or extra-axial fluid collection. Sequela of  remote bilateral basal ganglia, thalamic and cerebellar insults. Vascular: Please see concurrent CTA Skull and upper cervical spine: Normal marrow signal. Sinuses/Orbits: Normal orbits. Mild pansinus disease. No mastoid effusion. Other: None. IMPRESSION: Small acute infarcts involving the left frontal and occipital lobes. Moderate cerebral atrophy and chronic microvascular ischemic changes. Remote bilateral basal ganglia, thalamic and cerebellar insults. Mild pansinus disease. These results were called by telephone at the time of interpretation on 03/08/2020 at 7:13 pm to provider ADAM CURATOLO , who verbally acknowledged these results. Electronically Signed   By: Primitivo Gauze M.D.   On: 03/08/2020 19:15    EKG: Independently reviewed. Sinus rhythm, LAD.   Assessment/Plan   1. Acute ischemic CVA  - Presents with speech difficulty, confusion, and a fall and is found to have acute ischemic infarctions on MRI involving left frontal and occipital lobes  - Neurology is consulting and much appreciated  - Continue cardiac monitoring, frequent neuro checks  - Consult PT/OT/SLP, check echocardiogram, fasting lipids, and A1c, continue Plavix and statin, follow-up neurology recs    2. Type II DM  - Patient had CBG of 52 pta, likely due to not eating today   - He was given juice prior to arrival and glucose has been elevated in ED  - Check CBGs, check A1c, use a low-intensity SSI for now    3. Renal insufficiency  - SCr is 1.46 on admission  - He has CKD on problem list but most recent prior labs available are from 2014 when SCr was 0.9   - Renally-dose medications, monitor    4. Hypertension  - BP normal in ED, hold antihypertensives for now in setting of acute ischemic CVA    5. Hyperlipidemia  - Continue Crestor, check fasting lipids    6. Memory loss  - Followed by neurology, treated with Aricept and Namenda   7. OSA  - Patient reports CPAP intolerance    DVT prophylaxis: Lovenox    Code Status: Full  Family Communication: Discussed with patient  Disposition Plan:  Patient is from: home  Anticipated d/c is to: TBD Anticipated d/c date is: 03/09/20 Patient currently: Pending neurology consultation, further eval of acute CVA, PT assessment   Consults called: Neurology  Admission status: Observation     Vianne Bulls, MD [facility]  03/08/2020, 8:21 PM

## 2020-03-08 NOTE — ED Notes (Signed)
Patient attempted to use urinal. States will press call bell when he feels the urge to go.

## 2020-03-08 NOTE — ED Notes (Signed)
Patient currently at MRI

## 2020-03-09 ENCOUNTER — Observation Stay (HOSPITAL_BASED_OUTPATIENT_CLINIC_OR_DEPARTMENT_OTHER): Payer: Medicare PPO

## 2020-03-09 ENCOUNTER — Observation Stay (HOSPITAL_COMMUNITY): Payer: Medicare PPO

## 2020-03-09 DIAGNOSIS — I340 Nonrheumatic mitral (valve) insufficiency: Secondary | ICD-10-CM | POA: Diagnosis not present

## 2020-03-09 DIAGNOSIS — I351 Nonrheumatic aortic (valve) insufficiency: Secondary | ICD-10-CM

## 2020-03-09 DIAGNOSIS — Y92003 Bedroom of unspecified non-institutional (private) residence as the place of occurrence of the external cause: Secondary | ICD-10-CM | POA: Diagnosis not present

## 2020-03-09 DIAGNOSIS — Z23 Encounter for immunization: Secondary | ICD-10-CM | POA: Diagnosis present

## 2020-03-09 DIAGNOSIS — E1122 Type 2 diabetes mellitus with diabetic chronic kidney disease: Secondary | ICD-10-CM | POA: Diagnosis present

## 2020-03-09 DIAGNOSIS — G4733 Obstructive sleep apnea (adult) (pediatric): Secondary | ICD-10-CM | POA: Diagnosis present

## 2020-03-09 DIAGNOSIS — R29702 NIHSS score 2: Secondary | ICD-10-CM | POA: Diagnosis present

## 2020-03-09 DIAGNOSIS — I639 Cerebral infarction, unspecified: Secondary | ICD-10-CM | POA: Diagnosis present

## 2020-03-09 DIAGNOSIS — L7632 Postprocedural hematoma of skin and subcutaneous tissue following other procedure: Secondary | ICD-10-CM | POA: Diagnosis not present

## 2020-03-09 DIAGNOSIS — I6522 Occlusion and stenosis of left carotid artery: Secondary | ICD-10-CM | POA: Diagnosis present

## 2020-03-09 DIAGNOSIS — D62 Acute posthemorrhagic anemia: Secondary | ICD-10-CM | POA: Diagnosis not present

## 2020-03-09 DIAGNOSIS — I973 Postprocedural hypertension: Secondary | ICD-10-CM | POA: Diagnosis not present

## 2020-03-09 DIAGNOSIS — Y838 Other surgical procedures as the cause of abnormal reaction of the patient, or of later complication, without mention of misadventure at the time of the procedure: Secondary | ICD-10-CM | POA: Diagnosis not present

## 2020-03-09 DIAGNOSIS — R29701 NIHSS score 1: Secondary | ICD-10-CM | POA: Diagnosis not present

## 2020-03-09 DIAGNOSIS — E162 Hypoglycemia, unspecified: Secondary | ICD-10-CM | POA: Diagnosis present

## 2020-03-09 DIAGNOSIS — E119 Type 2 diabetes mellitus without complications: Secondary | ICD-10-CM | POA: Diagnosis not present

## 2020-03-09 DIAGNOSIS — K219 Gastro-esophageal reflux disease without esophagitis: Secondary | ICD-10-CM | POA: Diagnosis present

## 2020-03-09 DIAGNOSIS — E860 Dehydration: Secondary | ICD-10-CM | POA: Diagnosis present

## 2020-03-09 DIAGNOSIS — R05 Cough: Secondary | ICD-10-CM | POA: Diagnosis not present

## 2020-03-09 DIAGNOSIS — I161 Hypertensive emergency: Secondary | ICD-10-CM | POA: Diagnosis not present

## 2020-03-09 DIAGNOSIS — N179 Acute kidney failure, unspecified: Secondary | ICD-10-CM | POA: Diagnosis present

## 2020-03-09 DIAGNOSIS — G8929 Other chronic pain: Secondary | ICD-10-CM | POA: Diagnosis present

## 2020-03-09 DIAGNOSIS — W06XXXA Fall from bed, initial encounter: Secondary | ICD-10-CM | POA: Diagnosis present

## 2020-03-09 DIAGNOSIS — R32 Unspecified urinary incontinence: Secondary | ICD-10-CM | POA: Diagnosis present

## 2020-03-09 DIAGNOSIS — J9811 Atelectasis: Secondary | ICD-10-CM | POA: Diagnosis not present

## 2020-03-09 DIAGNOSIS — R4701 Aphasia: Secondary | ICD-10-CM | POA: Diagnosis present

## 2020-03-09 DIAGNOSIS — I97638 Postprocedural hematoma of a circulatory system organ or structure following other circulatory system procedure: Secondary | ICD-10-CM | POA: Diagnosis not present

## 2020-03-09 DIAGNOSIS — E785 Hyperlipidemia, unspecified: Secondary | ICD-10-CM | POA: Diagnosis present

## 2020-03-09 DIAGNOSIS — I10 Essential (primary) hypertension: Secondary | ICD-10-CM | POA: Diagnosis not present

## 2020-03-09 DIAGNOSIS — E11649 Type 2 diabetes mellitus with hypoglycemia without coma: Secondary | ICD-10-CM | POA: Diagnosis present

## 2020-03-09 DIAGNOSIS — F1010 Alcohol abuse, uncomplicated: Secondary | ICD-10-CM | POA: Diagnosis present

## 2020-03-09 DIAGNOSIS — I129 Hypertensive chronic kidney disease with stage 1 through stage 4 chronic kidney disease, or unspecified chronic kidney disease: Secondary | ICD-10-CM | POA: Diagnosis present

## 2020-03-09 DIAGNOSIS — Z20822 Contact with and (suspected) exposure to covid-19: Secondary | ICD-10-CM | POA: Diagnosis present

## 2020-03-09 DIAGNOSIS — F1290 Cannabis use, unspecified, uncomplicated: Secondary | ICD-10-CM | POA: Diagnosis present

## 2020-03-09 DIAGNOSIS — J9601 Acute respiratory failure with hypoxia: Secondary | ICD-10-CM | POA: Diagnosis not present

## 2020-03-09 LAB — URINE CULTURE: Culture: NO GROWTH

## 2020-03-09 LAB — RPR: RPR Ser Ql: NONREACTIVE

## 2020-03-09 LAB — CREATININE, URINE, RANDOM: Creatinine, Urine: 194.09 mg/dL

## 2020-03-09 LAB — HIV ANTIBODY (ROUTINE TESTING W REFLEX): HIV Screen 4th Generation wRfx: NONREACTIVE

## 2020-03-09 LAB — GLUCOSE, CAPILLARY
Glucose-Capillary: 118 mg/dL — ABNORMAL HIGH (ref 70–99)
Glucose-Capillary: 93 mg/dL (ref 70–99)
Glucose-Capillary: 118 mg/dL — ABNORMAL HIGH (ref 70–99)
Glucose-Capillary: 151 mg/dL — ABNORMAL HIGH (ref 70–99)
Glucose-Capillary: 119 mg/dL — ABNORMAL HIGH (ref 70–99)

## 2020-03-09 LAB — CBC
HCT: 36.7 % — ABNORMAL LOW (ref 39.0–52.0)
Hemoglobin: 12.1 g/dL — ABNORMAL LOW (ref 13.0–17.0)
MCH: 29.7 pg (ref 26.0–34.0)
MCHC: 33 g/dL (ref 30.0–36.0)
MCV: 90.2 fL (ref 80.0–100.0)
Platelets: 155 10*3/uL (ref 150–400)
RBC: 4.07 MIL/uL — ABNORMAL LOW (ref 4.22–5.81)
RDW: 13 % (ref 11.5–15.5)
WBC: 9 10*3/uL (ref 4.0–10.5)
nRBC: 0 % (ref 0.0–0.2)

## 2020-03-09 LAB — COMPREHENSIVE METABOLIC PANEL
ALT: 29 U/L (ref 0–44)
AST: 34 U/L (ref 15–41)
Albumin: 3.3 g/dL — ABNORMAL LOW (ref 3.5–5.0)
Alkaline Phosphatase: 101 U/L (ref 38–126)
Anion gap: 10 (ref 5–15)
BUN: 14 mg/dL (ref 8–23)
CO2: 23 mmol/L (ref 22–32)
Calcium: 9.1 mg/dL (ref 8.9–10.3)
Chloride: 105 mmol/L (ref 98–111)
Creatinine, Ser: 1.33 mg/dL — ABNORMAL HIGH (ref 0.61–1.24)
GFR calc Af Amer: >60 mL/min (ref >60)
GFR calc non Af Amer: 52 mL/min — ABNORMAL LOW (ref >60)
Glucose, Bld: 203 mg/dL — ABNORMAL HIGH (ref 70–99)
Potassium: 3.4 mmol/L — ABNORMAL LOW (ref 3.5–5.1)
Sodium: 138 mmol/L (ref 135–145)
Total Bilirubin: 1.1 mg/dL (ref 0.3–1.2)
Total Protein: 5.9 g/dL — ABNORMAL LOW (ref 6.5–8.1)

## 2020-03-09 LAB — ECHOCARDIOGRAM COMPLETE
Area-P 1/2: 2.95 cm2
Height: 69 in
P 1/2 time: 390 msec
S' Lateral: 3.8 cm
Weight: 2966.51 oz

## 2020-03-09 LAB — SEDIMENTATION RATE: Sed Rate: 22 mm/hr — ABNORMAL HIGH (ref 0–16)

## 2020-03-09 LAB — LIPID PANEL
Cholesterol: 166 mg/dL (ref 0–200)
HDL: 50 mg/dL (ref >40)
LDL Cholesterol: 95 mg/dL (ref 0–99)
Total CHOL/HDL Ratio: 3.3 RATIO
Triglycerides: 105 mg/dL (ref <150)
VLDL: 21 mg/dL (ref 0–40)

## 2020-03-09 LAB — VITAMIN B12: Vitamin B-12: 203 pg/mL (ref 180–914)

## 2020-03-09 LAB — HEMOGLOBIN A1C
Hgb A1c MFr Bld: 6.8 % — ABNORMAL HIGH (ref 4.8–5.6)
Mean Plasma Glucose: 148.46 mg/dL

## 2020-03-09 LAB — SODIUM, URINE, RANDOM: Sodium, Ur: 50 mmol/L

## 2020-03-09 LAB — TSH: TSH: 0.82 u[IU]/mL (ref 0.350–4.500)

## 2020-03-09 LAB — MAGNESIUM: Magnesium: 2.2 mg/dL (ref 1.7–2.4)

## 2020-03-09 MED ORDER — CITALOPRAM HYDROBROMIDE 20 MG PO TABS
20.0000 mg | ORAL_TABLET | Freq: Every day | ORAL | Status: DC
  Administered 2020-03-09 – 2020-03-10 (×2): 20 mg via ORAL
  Filled 2020-03-09 (×3): qty 2

## 2020-03-09 MED ORDER — INSULIN ASPART 100 UNIT/ML ~~LOC~~ SOLN
0.0000 [IU] | Freq: Three times a day (TID) | SUBCUTANEOUS | Status: DC
  Administered 2020-03-09 – 2020-03-10 (×3): 2 [IU] via SUBCUTANEOUS
  Administered 2020-03-11 – 2020-03-14 (×5): 1 [IU] via SUBCUTANEOUS
  Administered 2020-03-16: 2 [IU] via SUBCUTANEOUS
  Administered 2020-03-16 – 2020-03-20 (×6): 1 [IU] via SUBCUTANEOUS
  Administered 2020-03-20 – 2020-03-21 (×2): 2 [IU] via SUBCUTANEOUS
  Administered 2020-03-21: 1 [IU] via SUBCUTANEOUS
  Administered 2020-03-22: 3 [IU] via SUBCUTANEOUS
  Administered 2020-03-22 – 2020-03-23 (×2): 1 [IU] via SUBCUTANEOUS

## 2020-03-09 MED ORDER — CYANOCOBALAMIN 1000 MCG/ML IJ SOLN
1000.0000 ug | Freq: Once | INTRAMUSCULAR | Status: AC
  Administered 2020-03-09: 1000 ug via INTRAMUSCULAR
  Filled 2020-03-09: qty 1

## 2020-03-09 MED ORDER — ACETAMINOPHEN 160 MG/5ML PO SOLN: 650.0000 mg | ORAL | Status: DC | PRN

## 2020-03-09 MED ORDER — MEMANTINE HCL 10 MG PO TABS
10.0000 mg | ORAL_TABLET | Freq: Two times a day (BID) | ORAL | Status: DC
  Administered 2020-03-09 – 2020-03-11 (×5): 10 mg via ORAL
  Filled 2020-03-09 (×7): qty 1

## 2020-03-09 MED ORDER — CLOPIDOGREL BISULFATE 75 MG PO TABS
300.0000 mg | ORAL_TABLET | Freq: Once | ORAL | Status: AC
  Administered 2020-03-09: 300 mg via ORAL
  Filled 2020-03-09: qty 4

## 2020-03-09 MED ORDER — STROKE: EARLY STAGES OF RECOVERY BOOK
Freq: Once | Status: AC
  Administered 2020-03-09: 1
  Filled 2020-03-09 (×2): qty 1

## 2020-03-09 MED ORDER — CLOPIDOGREL BISULFATE 75 MG PO TABS
75.0000 mg | ORAL_TABLET | Freq: Every day | ORAL | Status: DC
  Administered 2020-03-10: 75 mg via ORAL
  Filled 2020-03-09: qty 1

## 2020-03-09 MED ORDER — ROSUVASTATIN CALCIUM 20 MG PO TABS: 20.0000 mg | ORAL_TABLET | Freq: Every day | ORAL | Status: DC

## 2020-03-09 MED ORDER — ASPIRIN 81 MG PO CHEW
81.0000 mg | CHEWABLE_TABLET | Freq: Every day | ORAL | Status: DC
  Administered 2020-03-10: 81 mg via ORAL
  Filled 2020-03-09: qty 1

## 2020-03-09 MED ORDER — INSULIN ASPART 100 UNIT/ML ~~LOC~~ SOLN
0.0000 [IU] | Freq: Every day | SUBCUTANEOUS | Status: DC
  Administered 2020-03-11 – 2020-03-22 (×2): 2 [IU] via SUBCUTANEOUS

## 2020-03-09 MED ORDER — CHLORHEXIDINE GLUCONATE CLOTH 2 % EX PADS
6.0000 | MEDICATED_PAD | Freq: Every day | CUTANEOUS | Status: DC
  Administered 2020-03-09 – 2020-03-23 (×11): 6 via TOPICAL

## 2020-03-09 MED ORDER — ENOXAPARIN SODIUM 40 MG/0.4ML ~~LOC~~ SOLN
40.0000 mg | Freq: Every day | SUBCUTANEOUS | Status: DC
  Administered 2020-03-09 – 2020-03-10 (×3): 40 mg via SUBCUTANEOUS
  Filled 2020-03-09 (×3): qty 0.4

## 2020-03-09 MED ORDER — ROSUVASTATIN CALCIUM 20 MG PO TABS
40.0000 mg | ORAL_TABLET | Freq: Every day | ORAL | Status: DC
  Administered 2020-03-09 – 2020-03-10 (×2): 40 mg via ORAL
  Filled 2020-03-09 (×2): qty 2

## 2020-03-09 MED ORDER — DONEPEZIL HCL 10 MG PO TABS
10.0000 mg | ORAL_TABLET | Freq: Every day | ORAL | Status: DC
  Administered 2020-03-09 – 2020-03-11 (×3): 10 mg via ORAL
  Filled 2020-03-09: qty 1
  Filled 2020-03-09: qty 2
  Filled 2020-03-09 (×2): qty 1

## 2020-03-09 MED ORDER — ACETAMINOPHEN 650 MG RE SUPP: 650.0000 mg | RECTAL | Status: DC | PRN

## 2020-03-09 MED ORDER — ASPIRIN 325 MG PO TABS
325.0000 mg | ORAL_TABLET | Freq: Once | ORAL | Status: AC
  Administered 2020-03-09: 325 mg via ORAL
  Filled 2020-03-09: qty 1

## 2020-03-09 MED ORDER — SENNOSIDES-DOCUSATE SODIUM 8.6-50 MG PO TABS: 1.0000 | ORAL_TABLET | Freq: Every evening | ORAL | Status: DC | PRN

## 2020-03-09 MED ORDER — VITAMIN B-12 1000 MCG PO TABS
1000.0000 ug | ORAL_TABLET | Freq: Every day | ORAL | Status: DC
  Administered 2020-03-10: 1000 ug via ORAL
  Filled 2020-03-09: qty 1

## 2020-03-09 MED ORDER — ACETAMINOPHEN 325 MG PO TABS
650.0000 mg | ORAL_TABLET | ORAL | Status: DC | PRN
  Administered 2020-03-13 – 2020-03-21 (×18): 650 mg via ORAL
  Filled 2020-03-09 (×20): qty 2

## 2020-03-09 NOTE — Progress Notes (Signed)
  Echocardiogram 2D Echocardiogram has been performed.  [name] [name] 03/09/2020, 10:43 AM

## 2020-03-09 NOTE — Evaluation (Signed)
Physical Therapy Evaluation Patient Details Name: [name] [name] MRN: [id number] DOB: [birth year] Today's Date: 03/09/2020   History of Present Illness  Pt is a 74 y/o male with PMH of memory loss, CKD, DM2, AAA, HTN, presenting to ED for evaluation of confusion, hypoglycemia, and a fall. 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     Clinical Impression  Pt admitted with above diagnosis. Pt currently with functional limitations due to the deficits listed below (see PT Problem List). At the time of PT eval pt was able to perform transfers with mod I to supervision and ambulation with up to min guard assist. Pt with occasional slow processing and difficulty following multi-step commands during OOB mobility. Unsure if this is baseline, as it was noted that the pt does not manage finances or his own medications. Likely at/near baseline of function, but will keep on PT caseload while admitted to continue to monitor. Acutely, pt will benefit from skilled PT to increase their independence and safety with mobility to allow discharge to the venue listed below.       Follow Up Recommendations No PT follow up;Supervision - Intermittent    Equipment Recommendations  None recommended by PT    Recommendations for Other Services       Precautions / Restrictions Precautions Precautions: Fall Restrictions Weight Bearing Restrictions: No      Mobility  Bed Mobility Overal bed mobility: Modified Independent Bed Mobility: Supine to Sit     Supine to sit: Modified independent (Device/Increase time)     General bed mobility comments: HOB flat and rails lowered to simulate home environment. Pt was able to transition to EOB without assist however with significantly increased time.  Transfers Overall transfer level: Needs assistance Equipment used: None Transfers: Sit to/from Stand Sit to Stand: Supervision         General transfer comment: Supervision for  safety. No assist required.   Ambulation/Gait Ambulation/Gait assistance: Min guard Gait Distance (Feet): 500 Feet Assistive device: None Gait Pattern/deviations: Step-through pattern;Decreased stride length;Drifts right/left Gait velocity: Decreased Gait velocity interpretation: 1.31 - 2.62 ft/sec, indicative of limited community ambulator General Gait Details: Min guard to supervision required. Pt grossly steady however with occasional stammer, especially when trying to figure out which way to go in the hallway.   Stairs Stairs: Yes Stairs assistance: Min guard Stair Management: No rails;Alternating pattern;Forwards Number of Stairs: 5 General stair comments: VC's to attempt without rails for DGI. Pt completed well.   Wheelchair Mobility    Modified Rankin (Stroke Patients Only)       Balance Overall balance assessment: Needs assistance Sitting-balance support: Feet supported;No upper extremity supported Sitting balance-Leahy Scale: Fair Sitting balance - Comments: Posterior lean during MMT   Standing balance support: No upper extremity supported;During functional activity Standing balance-Leahy Scale: Fair                   Standardized Balance Assessment Standardized Balance Assessment : Dynamic Gait Index   Dynamic Gait Index Level Surface: Normal Change in Gait Speed: Normal Gait with Horizontal Head Turns: Mild Impairment Gait with Vertical Head Turns: Normal Gait and Pivot Turn: Normal Step Over Obstacle: Mild Impairment Step Around Obstacles: Mild Impairment Steps: Normal Total Score: 21       Pertinent Vitals/Pain Pain Assessment: No/denies pain    Home Living Family/patient expects to be discharged to:: Private residence Living Arrangements: Spouse/significant other Available Help at Discharge: Family Type of Home: House Home Access: Stairs  to enter Entrance Stairs-Rails: Chemical engineer of Steps: 2 Home Layout: One  level Home Equipment: None      Prior Function Level of Independence: Independent         Comments: independent and driving, yardwork      Hand Dominance   Dominant Hand: Right    Extremity/Trunk Assessment   Upper Extremity Assessment Upper Extremity Assessment: Defer to OT evaluation    Lower Extremity Assessment Lower Extremity Assessment: Overall WFL for tasks assessed (5/5 strength quads, hip flexors, hamstrings, ankle DF)    Cervical / Trunk Assessment Cervical / Trunk Assessment: Normal  Communication   Communication: No difficulties  Cognition Arousal/Alertness: Awake/alert Behavior During Therapy: WFL for tasks assessed/performed Overall Cognitive Status: History of cognitive impairments - at baseline Area of Impairment: Memory;Problem solving;Attention                   Current Attention Level: Sustained Memory: Decreased short-term memory       Problem Solving: Difficulty sequencing;Requires verbal cues General Comments: patient presenting with decreased STM but reports this is baseline, noted difficulty attending to task      General Comments      Exercises     Assessment/Plan    PT Assessment Patient needs continued PT services  PT Problem List Decreased strength;Decreased activity tolerance;Decreased balance;Decreased coordination;Decreased mobility;Decreased knowledge of use of DME;Decreased safety awareness;Decreased cognition;Decreased knowledge of precautions       PT Treatment Interventions DME instruction;Gait training;Stair training;Functional mobility training;Therapeutic activities;Therapeutic exercise;Neuromuscular re-education;Patient/family education;Cognitive remediation    PT Goals (Current goals can be found in the Care Plan section)  Acute Rehab PT Goals Patient Stated Goal: home ASAP PT Goal Formulation: With patient Time For Goal Achievement: 03/16/20 Potential to Achieve Goals: Good    Frequency Min 4X/week    Barriers to discharge        Co-evaluation               AM-PAC PT "6 Clicks" Mobility  Outcome Measure Help needed turning from your back to your side while in a flat bed without using bedrails?: None Help needed moving from lying on your back to sitting on the side of a flat bed without using bedrails?: None Help needed moving to and from a bed to a chair (including a wheelchair)?: None Help needed standing up from a chair using your arms (e.g., wheelchair or bedside chair)?: None Help needed to walk in hospital room?: A Little Help needed climbing 3-5 steps with a railing? : A Little 6 Click Score: 22    End of Session Equipment Utilized During Treatment: Gait belt Activity Tolerance: Patient tolerated treatment well Patient left: in chair;with call bell/phone within reach;Other (comment) (MD in room) Nurse Communication: Mobility status PT Visit Diagnosis: Unsteadiness on feet (R26.81);Other symptoms and signs involving the nervous system (R29.898)    Time: 7824-2353 PT Time Calculation (min) (ACUTE ONLY): 23 min   Charges:   PT Evaluation $PT Eval Moderate Complexity: 1 Mod PT Treatments $Gait Training: 8-22 mins        Rolinda Roan, PT, DPT Acute Rehabilitation Services Pager: [phone] Office: [phone]   Thelma Comp 03/09/2020, 1:23 PM

## 2020-03-09 NOTE — Plan of Care (Signed)
  Problem: Education: Goal: Knowledge of General Education information will improve Description: Including pain rating scale, medication(s)/side effects and non-pharmacologic comfort measures Outcome: Progressing   Problem: Health Behavior/Discharge Planning: Goal: Ability to manage health-related needs will improve Outcome: Progressing  Patient uses call light, waits for staff to arrive before getting up.

## 2020-03-09 NOTE — TOC CAGE-AID Note (Signed)
Transition of Care [facility]) - CAGE-AID Screening   Patient Details  Name: [name] [name] MRN: [id number] Date of Birth: 1945-12-15  Transition of Care [facility]) CM/SW Contact:    Emeterio Reeve, [place] Phone Number: 03/09/2020, 11:41 AM   Clinical Narrative:  Pt is unable to participate in assessment due to memory impairment.   CAGE-AID Screening: Substance Abuse Screening unable to be completed due to: : Patient unable to participate                  [place] Clinical Social Worker [phone]

## 2020-03-09 NOTE — Progress Notes (Addendum)
STROKE TEAM PROGRESS NOTE   Interval History   Patient is up and resting in bed upon entering the room. He has no family at bedside. He was informed about his stroke and his MRI images were reviewed with him.   Vitals:   03/09/20 0115 03/09/20 0201 03/09/20 0400 03/09/20 0850  BP: 137/70 137/82 (!) 123/54 139/60  Pulse: 61 (!) 57 (!) 56 60  Resp:  20  16  Temp:  98.3 F (36.8 C) 98.4 F (36.9 C) 97.7 F (36.5 C)  TempSrc:  Oral Oral Oral  SpO2:  99% 97% 95%  Weight:  84.1 kg    Height:  5\' 9"  (1.753 m)      Pertinent Imaging and Lab Work   03/08/20 CT Head WO Contrast 1. No acute intracranial abnormality. No skull fracture. 2. Stable atrophy, chronic small vessel ischemia, and remote basal gangliar lacunar infarcts.  03/08/20 MRI Brain WO Contrast Small acute infarcts involving the left frontal and occipital lobes.Moderate cerebral atrophy and chronic microvascular ischemicchanges. Remote bilateral basal ganglia, thalamic and cerebellar Insults. Mild pansinus disease.  03/08/20 CT Angio Head and Neck W WO Contrast 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 30% luminal narrowing of the innominate secondary to atheromatous plaque. Approximately 50% narrowing of the left carotid bulb and 80-90% narrowing of the proximal left ICA secondary to atheromatous plaque. Mild right V1 and V4 segment narrowing secondary to atheromatous disease. Mild narrowing of the proximal right petrous and left paraclinoid ICA.  03/08/20 MR Cervical +Thoracic Spine WO Contrast 1. Normal MRI of the cervicothoracic spinal cord. No cord signalchanges to suggest myelopathy or other abnormality. 2. Mild multilevel cervical spondylosis and facet arthrosis without significant spinal stenosis. Associated mild to moderate bilateral C4 through C6 foraminal narrowing as above. 3. No significant disc pathology within the thoracic spine for patient age. No thoracic spinal stenosis or neural  impingement.  03/09/20 Echocardiogram Complete  Results pending   Stroke labs: Hemoglobin A1C: 6.8 Lipid panel: Total cholesterol 166, Triglycerides 105, HDL 50, LDL 95  Physical Examination Constitutional: Alert, resting in bed, very pleasant  MS: AAOx4 Speech: Fluent with repetition and naming intact CN: EOMI, VFF, No facial droop, sensation intact to light touch in V1,V2,V3, tongue midline, shoulder shrug intact  Strength: 5/5 throughout  Sensation: Intact to light tough throughout  Coordination: Intact FNF + HTS  Gait: Deferred   NIHSS 0   Assessment and Plan [name], HTN, [name], Femoral artery stenosis, [name], [name], chronic back pain who presents with drowsiness + a fall in the bathroom in which he hit his head but did not lose consciousness. At the time, his wife noted that he had difficulty finding the correct words to use and seemed confused thus EMS was called. BG was noted to be 52, was treated however speech difficulty and confusion persisted. In the ED his MRI Brain was notable for small acute stroke involving the left frontal and occipital lobes for which neurology was consulted.   # Left Frontal and Occipital Lobe Stroke  His stroke work up thus far has included CT A Head and Neck + stroke labs, echocardiogram results are pending. His CTA Head and Neck was notable for approximately 50% narrowing of the left carotid bulb and 80-90% narrowing of the proximal left ICA secondary to atheromatous plaque. Mild right V1 and V4 segment narrowing secondary  to atheromatous disease. Stroke labs have been completed including hemoglobin A1C 6.8, LDL 95. More than likely his stroke etiology is due to large artery atherosclerosis; given his strokes, including the left occipital stroke are in the L MCA/PCA watershed territory. DAPT was initiated for secondary stroke prevention. His home  crestor was increased from 20 mg to 40 mg, aiming for an LDL < 70.  - Continue DAPT for secondary stroke prevention  - Continue Crestor 40 mg for secondary stroke prevention  - Consult vascular surgery for possible CAE given level of left carotid stenosis  - No need for carotid US to further characterize LICA stenosis  - F/U echocardiogram results  # Hypertension He has a history of HTN and takes Metoprolol XL 100 mg QD at home. Blood pressure is currently trending 130-160. Recommend permissive HTN for the first 48 hours post stroke then can gradually normalize blood pressure. Given stenosis, be careful lowering blood pressure and avoid acute drops. Long term blood pressure goal is < 140/90.   # DMII  From a stroke prevention standpoint his A1C is at goal at 6.8, < 7. At discharge his home diabetic regimen can be resumed  # Ethanol abuse  # Chronic memory issues  - Thiamine level, empiric supplementation 400 mg q8hr x 3 days then 100 mg daily  - Folate supplementation  - B12 level and repletion if indicated  - CIWAS protocol   Will continue to follow patient at this time.  [name] Hinds, NP  [facility] Nurse Practitioner Patient seen and discussed with attending physician Dr. Erlinda [name]  ATTENDING NOTE: I reviewed above note and agree with the assessment and plan. Pt was seen and examined.   74 year old male with history of hypertension, hyperlipidemia, diabetes, [name], mild dementia, AAA status post graft in 2014, alcohol abuse and THC user admitted for confusion and a fall.  Per patient, he was confused yesterday in the bathroom, peeing all over the floor, and fell in the bathroom not of a get up.  His wife helped him back to bed, he again slipped from bed to the floor.  He was confused and cannot remember details.  Per chart, he also had hypoglycemia, urinary incontinence and nausea vomiting.  However, this morning on examination, he is back to his neuro baseline,  neurologically intact.  CT no acute abnormality.  CT head and neck showed left ICA proximal high-grade stenosis.  MRI showed 2 punctate infarcts at left frontal MCA territory and left occipital temporal area MCA/PCA territory.  Both infarcts felt to related to the left ICA high-grade stenosis.  Vascular surgery consulted to consider left CEA.  EF 55 to 60%.  Carotid Doppler pending per vascular surgery.  Orthostatic vital pending. B12 203, A1c 6.8 and LDL 95.  Creatinine 1.46, WBC 12.7.  He was given B12 supplement.  Also put on aspirin 81 and Plavix 75, increased his Crestor from 20 to 40.  He is also on CIWA protocol and B1/folic acid/multivitamin.  We will follow.  Rosalin Hawking, MD PhD Stroke Neurology 03/09/2020 3:10 PM     To contact Stroke Continuity provider, please refer to [email or web address] After hours, contact General Neurology

## 2020-03-09 NOTE — Evaluation (Signed)
Occupational Therapy Evaluation Patient Details Name: [name] [name] MRN: [id number] DOB: 06/03/1946 Today's Date: 03/09/2020    History of Present Illness Pt is a 74 y/o male with PMH of memory loss, CKD, DM2, AAA, HTN, presenting to ED for evaluation of confusion, hypoglycemia, and a fall. 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    Clinical Impression   PTA patient independent with ADLs, mobility and driving, spouse assist with IADLs (med mgmt, fiances).  Admitted for above and limited by problem list below, including mild unsteadiness, decreased activity tolerance, and impaired cognition.  Patient alert and oriented, following commands with increased time, noted decreased STM (but this is baseline) and decreased sequencing/problem solving; will benefit from further functional cognition assessment using pill box test.  Patient currently requires min guard for transfers and in room mobility, LB ADLs and grooming.  Pt endorses 2 recent falls in bathroom and believe he will benefit from 3:1 for shower chair for increased safety. He reports his spouse can provide 24/7 support as needed.  Based on performance today, believe he will benefit from further OT services while admitted but anticipate he will progress well with no further needs after dc home.     Follow Up Recommendations  No OT follow up;Supervision/Assistance - 24 hour (inital)    Equipment Recommendations  3 in 1 bedside commode (for shower chair )    Recommendations for Other Services       Precautions / Restrictions Precautions Precautions: Fall Restrictions Weight Bearing Restrictions: No      Mobility Bed Mobility Overal bed mobility: Needs Assistance Bed Mobility: Supine to Sit;Sit to Supine     Supine to sit: Supervision;HOB elevated Sit to supine: Supervision   General bed mobility comments: HOB elevated with increased time  Transfers Overall transfer level: Needs  assistance   Transfers: Sit to/from Stand Sit to Stand: Min guard         General transfer comment: min guard for mild unsteadiness initally     Balance Overall balance assessment: Mild deficits observed, not formally tested                                         ADL either performed or assessed with clinical judgement   ADL Overall ADL's : Needs assistance/impaired     Grooming: Min guard;Standing   Upper Body Bathing: Set up;Sitting   Lower Body Bathing: Min guard;Sit to/from stand   Upper Body Dressing : Set up;Sitting   Lower Body Dressing: Min guard;Sit to/from stand   Toilet Transfer: Min guard;Ambulation           Functional mobility during ADLs: Min guard General ADL Comments: pt limited by impaired cognition and dynamic balance      Vision Baseline Vision/History: Wears glasses Wears Glasses: At all times Patient Visual Report: No change from baseline Vision Assessment?: No apparent visual deficits     Perception     Praxis      Pertinent Vitals/Pain Pain Assessment: No/denies pain     Hand Dominance Right   Extremity/Trunk Assessment Upper Extremity Assessment Upper Extremity Assessment: LUE deficits/detail LUE Deficits / Details: mild L sided decreased FMC, reports thumb soreness  LUE Sensation: WNL LUE Coordination: decreased fine motor   Lower Extremity Assessment Lower Extremity Assessment: Defer to PT evaluation   Cervical / Trunk Assessment Cervical / Trunk Assessment: Normal  Communication Communication Communication: No difficulties   Cognition Arousal/Alertness: Awake/alert Behavior During Therapy: WFL for tasks assessed/performed Overall Cognitive Status: Impaired/Different from baseline Area of Impairment: Memory;Problem solving;Attention                   Current Attention Level: Sustained Memory: Decreased short-term memory       Problem Solving: Difficulty sequencing;Requires verbal  cues General Comments: patient presenting with decreased STM but reports this is baseline, noted difficulty sequencing months backwards and attending to task   General Comments  patient reports spouse assists with medication and fiances    Exercises     Shoulder Instructions      Home Living Family/patient expects to be discharged to:: Private residence Living Arrangements: Spouse/significant other Available Help at Discharge: Family;Available 24 hours/day Type of Home: House Home Access: Stairs to enter [employer] of Steps: 2 Entrance Stairs-Rails: Left;Right Home Layout: One level     Bathroom Shower/Tub: Occupational psychologist: Standard     Home Equipment: None          Prior Functioning/Environment Level of Independence: Independent        Comments: independent and driving, yardwork         OT Problem List: Decreased activity tolerance;Impaired balance (sitting and/or standing);Decreased coordination;Decreased cognition;Decreased safety awareness;Decreased knowledge of use of DME or AE;Decreased knowledge of precautions      OT Treatment/Interventions: Self-care/ADL training;DME and/or AE instruction;Therapeutic activities;Patient/family education;Balance training;Therapeutic exercise    OT Goals(Current goals can be found in the care plan section) Acute Rehab OT Goals Patient Stated Goal: home  OT Goal Formulation: With patient Time For Goal Achievement: 03/23/20 Potential to Achieve Goals: Good  OT Frequency: Min 2X/week   Barriers to D/C:            Co-evaluation              AM-PAC OT "6 Clicks" Daily Activity     Outcome Measure Help from another person eating meals?: None Help from another person taking care of personal grooming?: A Little Help from another person toileting, which includes using toliet, bedpan, or urinal?: A Little Help from another person bathing (including washing, rinsing, drying)?: A  Little Help from another person to put on and taking off regular upper body clothing?: A Little Help from another person to put on and taking off regular lower body clothing?: A Little 6 Click Score: 19   End of Session Nurse Communication: Mobility status  Activity Tolerance: Patient tolerated treatment well Patient left: in bed;with call bell/phone within reach;with bed alarm set  OT Visit Diagnosis: Other abnormalities of gait and mobility (R26.89);History of falling (Z91.81);Other symptoms and signs involving cognitive function                Time: 5456-2563 OT Time Calculation (min): 29 min Charges:  OT General Charges $OT Visit: 1 Visit OT Evaluation $OT Eval Moderate Complexity: 1 Mod OT Treatments $Self Care/Home Management : 8-22 mins  Jolaine Artist, OT Acute Rehabilitation Services Pager [phone] Office [phone]   Delight Stare 03/09/2020, 10:38 AM

## 2020-03-09 NOTE — Consult Note (Addendum)
[facility] NOTE   MRN : [id number]  Reason for Consult: carotid stenosis with stroke symptom of aphasia Referring Physician:  Dr. Curly Shores  History of Present Illness: 74 y/o male reported slurred speech and was reported confused in the am 03/08/20 by his wife.  His symptoms  have resolved.  He denise amaurosis, dysphasia or weakness in hi extremities.  He denise previous stroke/TIA.    He underwent stroke work up and on CTA : Left carotid system: Patent normal caliber common carotid artery.  Carotid bifurcation atherosclerotic calcifications with approximately 80-90% narrowing of the proximal ICA. The carotid bulb is narrowed by approximately 50%.    Past medical history: (hypertension, hyperlipidemia, diabetes, severe obstructive sleep apnea non-compliant with CPAP.  History of AAA managed with EVAR by Dr. Kellie Simmering.        Current Facility-Administered Medications  Medication Dose Route Frequency Provider Last Rate Last Admin  . acetaminophen (TYLENOL) tablet 650 mg  650 mg Oral Q4H PRN Opyd, Ilene Qua, MD       Or  . acetaminophen (TYLENOL) 160 MG/5ML solution 650 mg  650 mg Per Tube Q4H PRN Opyd, Ilene Qua, MD       Or  . acetaminophen (TYLENOL) suppository 650 mg  650 mg Rectal Q4H PRN Opyd, Ilene Qua, MD      . Derrill Memo ON 03/10/2020] aspirin chewable tablet 81 mg  81 mg Oral Daily Opyd, Ilene Qua, MD      . Chlorhexidine Gluconate Cloth 2 % PADS 6 each  6 each Topical Daily Opyd, Ilene Qua, MD      . citalopram (CELEXA) tablet 20 mg  20 mg Oral Daily Rosalin Hawking, MD   20 mg at 03/09/20 0916  . [START ON 03/10/2020] clopidogrel (PLAVIX) tablet 75 mg  75 mg Oral Daily Opyd, Ilene Qua, MD      . donepezil (ARICEPT) tablet 10 mg  10 mg Oral QHS Rosalin Hawking, MD      . enoxaparin (LOVENOX) injection 40 mg  40 mg Subcutaneous QHS Opyd, Ilene Qua, MD   40 mg at 03/09/20 0219  . folic acid (FOLVITE) tablet 1 mg  1 mg Oral Daily Opyd, Ilene Qua, MD   1 mg at  03/09/20 0908  . insulin aspart (novoLOG) injection 0-5 Units  0-5 Units Subcutaneous QHS Opyd, Timothy S, MD      . insulin aspart (novoLOG) injection 0-9 Units  0-9 Units Subcutaneous TID WC Opyd, Timothy S, MD      . LORazepam (ATIVAN) injection 0-4 mg  0-4 mg Intravenous Q6H Opyd, Ilene Qua, MD       Followed by  . [START ON 03/10/2020] LORazepam (ATIVAN) injection 0-4 mg  0-4 mg Intravenous Q12H Opyd, Timothy S, MD      . LORazepam (ATIVAN) tablet 1-4 mg  1-4 mg Oral Q1H PRN Opyd, Ilene Qua, MD       Or  . LORazepam (ATIVAN) injection 1-4 mg  1-4 mg Intravenous Q1H PRN Opyd, Ilene Qua, MD      . memantine (NAMENDA) tablet 10 mg  10 mg Oral BID Rosalin Hawking, MD   10 mg at 03/09/20 3903  . multivitamin with minerals tablet 1 tablet  1 tablet Oral Daily Opyd, Ilene Qua, MD   1 tablet at 03/09/20 0908  . rosuvastatin (CRESTOR) tablet 40 mg  40 mg Oral q1800 Rosalin Hawking, MD      . senna-docusate (Senokot-S) tablet 1 tablet  1 tablet  Oral QHS PRN Opyd, Ilene Qua, MD      . thiamine (B-1) 400 mg in sodium chloride 0.9 % 50 mL IVPB  400 mg Intravenous Q8H Opyd, Ilene Qua, MD 100 mL/hr at 03/09/20 0908 400 mg at 03/09/20 0908  . [START ON 03/12/2020] thiamine (B-1) injection 100 mg  100 mg Intravenous Daily Opyd, Ilene Qua, MD      . Derrill Memo ON 03/10/2020] vitamin B-12 (CYANOCOBALAMIN) tablet 1,000 mcg  1,000 mcg Oral Daily Rosalin Hawking, MD        Pt meds include: Statin :Yes Betablocker: Yes ASA: Yes Other anticoagulants/antiplatelets: Plavix this admission  Past Medical History:  Diagnosis Date  . AAA (abdominal aortic aneurysm) ([place])   . Arthritis    Gout  . Back pain   . CKD (chronic kidney disease)   . Diabetes mellitus without complication ([place])    type 2  . Encephalopathy acute   . GERD (gastroesophageal reflux disease)   . Gout   . Gout   . Hyperlipidemia   . Hypertension   . Lung nodule, solitary 2008   RLL, 8 mm in 2008, 14 mm in 2014  . Memory change   . Polyarthralgia   .  Reflux   . Stroke [facility])    hx of multiple    Past Surgical History:  Procedure Laterality Date  . ABDOMINAL AORTIC ENDOVASCULAR STENT GRAFT N/A 04/29/2013   Procedure: ABDOMINAL AORTIC ENDOVASCULAR STENT GRAFT -GORE;  Surgeon: Mal Misty, MD;  Location: [facility];  Service: Vascular;  Laterality: N/A;  . COLONOSCOPY  2010  . [place]  . TIBIA FRACTURE SURGERY Left   . VASECTOMY    . WRIST FRACTURE SURGERY Left 1970    Social History Social History   Tobacco Use  . Smoking status: Former Smoker    Packs/day: 1.00    Years: 20.00    Pack years: 20.00    Types: Cigarettes    Quit date: 07/03/1983    Years since quitting: 36.7  . Smokeless tobacco: Never Used  Substance Use Topics  . Alcohol use: Yes    Alcohol/week: 12.0 standard drinks    Types: 12 Cans of beer per week    Comment: 08/06/18 18 pk/week  . Drug use: No    Family History Family History  Problem Relation Age of Onset  . Diabetes Father   . Stroke Father   . Heart attack Father   . Alcohol abuse Father   . Heart attack Brother        X's 2  . Diabetes Brother   . Hypertension Brother   . Alcohol abuse Brother   . Coronary artery disease Mother   . Diabetes Mother   . Heart attack Mother   . Hypertension Brother   . Alcohol abuse Brother   . Diabetes Sister   . Dementia Maternal Aunt   . Alcohol abuse Maternal Uncle   . Alcohol abuse Paternal Uncle   . Alcohol abuse Maternal Grandfather   . Alcohol abuse Paternal Grandfather     No Known Allergies   REVIEW OF SYSTEMS  General: [ ]  Weight loss, [ ]  Fever, [ ]  chills Neurologic: [ ]  Dizziness, [ ]  Blackouts, [ ]  Seizure [ ]  Stroke, [ ]  "Mini stroke", [x ] Slurred speech, [ ]  Temporary blindness; [ ]  weakness in arms or legs, [ ]  Hoarseness [ ]  Dysphagia Cardiac: [ ]  Chest pain/pressure, [ ]  Shortness of breath at rest [ ]   Shortness of breath with exertion, [ ]  Atrial fibrillation or irregular heartbeat  Vascular: [ ]  Pain  in legs with walking, [ ]  Pain in legs at rest, [ ]  Pain in legs at night,  [ ]  Non-healing ulcer, [ ]  Blood clot in vein/DVT,   Pulmonary: [ ]  Home oxygen, [ ]  Productive cough, [ ]  Coughing up blood, [ ]  Asthma,  [ ]  Wheezing [ ]  COPD Musculoskeletal:  [ ]  Arthritis, [x ] Low back pain, [ ]  Joint pain Hematologic: [ ]  Easy Bruising, [ ]  Anemia; [ ]  Hepatitis Gastrointestinal: [ ]  Blood in stool, [ ]  Gastroesophageal Reflux/heartburn, Urinary: [ ]  chronic Kidney disease, [ ]  on HD - [ ]  MWF or [ ]  TTHS, [ ]  Burning with urination, [ ]  Difficulty urinating Skin: [ ]  Rashes, [ ]  Wounds Psychological: [ ]  Anxiety, [ ]  Depression  Physical Examination Vitals:   03/09/20 0201 03/09/20 0400 03/09/20 0850 03/09/20 1245  BP: 137/82 (!) 123/54 139/60 137/64  Pulse: (!) 57 (!) 56 60 61  Resp: 20  16 16   Temp: 98.3 F (36.8 C) 98.4 F (36.9 C) 97.7 F (36.5 C) 97.9 F (36.6 C)  TempSrc: Oral Oral Oral Oral  SpO2: 99% 97% 95% 94%  Weight: 84.1 kg     Height: 5\' 9"  (1.753 m)      Body mass index is 27.38 kg/m.  General:  WDWN in NAD Gait: Normal HENT: WNL Eyes: Pupils equal Pulmonary: normal non-labored breathing , without Rales, rhonchi,  wheezing Cardiac: RRR, without  Murmurs, rubs or gallops; No carotid bruits Abdomen: soft, NT, no masses Skin: no rashes, ulcers noted;  no Gangrene , no cellulitis; no open wounds;   Vascular Exam/Pulses:palpable radial, DP pulses B   Musculoskeletal: no muscle wasting or atrophy; no edema  Neurologic: A&O X 3; Appropriate Affect ;  SENSATION: normal; MOTOR FUNCTION: 5/5 Symmetric Speech is fluent/normal   Significant Diagnostic Studies: CBC Lab Results  Component Value Date   WBC 9.0 03/09/2020   HGB 12.1 (L) 03/09/2020   HCT 36.7 (L) 03/09/2020   MCV 90.2 03/09/2020   PLT 155 03/09/2020    BMET    Component Value Date/Time   NA 138 03/09/2020 1000   K 3.4 (L) 03/09/2020 1000   CL 105 03/09/2020 1000   CO2 23 03/09/2020 1000    GLUCOSE 203 (H) 03/09/2020 1000   BUN 14 03/09/2020 1000   CREATININE 1.33 (H) 03/09/2020 1000   CALCIUM 9.1 03/09/2020 1000   GFRNONAA 52 (L) 03/09/2020 1000   GFRAA >60 03/09/2020 1000   Estimated Creatinine Clearance: 48.7 mL/min (A) (by C-G formula based on SCr of 1.33 mg/dL (H)).  COAG Lab Results  Component Value Date   INR 0.9 03/08/2020   INR 1.02 04/29/2013   INR 0.90 04/24/2013     Non-Invasive Vascular Imaging:  CTA NECK FINDINGS  Aortic arch: Standard branching. Imaged portion shows no evidence of aneurysm or dissection. Extensive noncalcified atheromatous plaque involving the aortic arch and great vessel origins. Some of the plaque demonstrates a pedunculated appearance. Approximately 30% luminal narrowing of the innominate artery secondary to atheromatous disease. No significant stenosis of the left common carotid and left subclavian artery origins.  Right carotid system: Noncalcified atheromatous disease involving the proximal right common carotid artery without significant luminal narrowing. Carotid bifurcation calcified atheromatous plaque without high-grade narrowing. Mild narrowing of the proximal right petrous segment secondary to noncalcified atheromatous plaque.  Left carotid system: Patent normal caliber common carotid  artery. Carotid bifurcation atherosclerotic calcifications with approximately 80-90% narrowing of the proximal ICA. The carotid bulb is narrowed by approximately 50%.  Vertebral arteries: Dominant right vertebral artery. Right V1 and V4 segment calcified atheromatous plaque with mild narrowing. Left vertebral artery atheromatous disease with no greater than mild narrowing.  Skeleton: Multilevel spondylosis.  No acute osseous abnormalities.  Other neck: No adenopathy.  No soft tissue mass.  Upper chest: Emphysema. Dependent atelectasis. Motion artifact limits evaluation.  Review of the MIP images confirms the above  findings  CTA HEAD FINDINGS  Anterior circulation: Bilateral carotid siphon atherosclerotic calcifications. Mild narrowing of the left paraclinoid ICA. Patent, normal caliber appearance of the anterior and middle cerebral arteries.  Posterior circulation: Dominant right vertebral artery. Normal caliber patent basilar, superior cerebellar and posterior cerebral arteries.  Venous sinuses: As permitted by contrast timing, patent.  Anatomic variants: None.  Review of the MIP images confirms the above findings  IMPRESSION: 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 30% luminal narrowing of the innominate secondary to atheromatous plaque.  Approximately 50% narrowing of the left carotid bulb and 80-90% narrowing of the proximal left ICA secondary to atheromatous plaque.  Mild right V1 and V4 segment narrowing secondary to atheromatous disease.  Mild narrowing of the proximal right petrous and left paraclinoid ICA.  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  Carotid duplex pending.   We will schedule him for left CEA this coming Friday with Dr. Carlis Abbott.  He and his wife agree with this plan.   Roxy Horseman 03/09/2020 1:15 PM   I have seen and evaluated the patient. I agree with the PA note as documented above.  74 year old male that vascular surgery has been consulted for a high-grade symptomatic left ICA stenosis.  Patient initially presented with slurred speech on Tuesday and has ultimately undergone stroke work-up and been evaluated by neurology.  MRI shows left hemispheric infarcts in the frontal and occipital lobe.  Neurology feels this is likely related to MCA distribution.  CTA neck does confirm a high-grade very short focal proximal left ICA stenosis greater than 80%.  Patient has no history of head or neck surgery.  Denies any other neurologic events prior to this presentation.  He  has a fairly accessible lesion after review of imaging.  I have recommended a left carotid endarterectomy for stroke risk prevention moving forward.  He can remain on aspirin Plavix from my standpoint.  He wants to try and have surgery before discharge after discussing with him and his wife and he is tentatively scheduled for Friday with me in the OR.  Risk and benefits discussed including risk of bleeding, cranial nerve injury, infection, 1% risk of perioperative stroke etc.  We discussed the goal of surgery is stroke risk reduction moving forward given concern for symptomatic carotid lesion.  He does have carotid duplex pending and echo to complete stroke work-up.  Marty Heck, MD [facility] [place] Office: [phone]

## 2020-03-09 NOTE — Evaluation (Signed)
Speech Language Pathology Evaluation Patient Details Name: [name] [name] MRN: [id number] DOB: 1945/10/06 Today's Date: 03/09/2020 Time: 1110-1130 SLP Time Calculation (min) (ACUTE ONLY): 20 min  Problem List:  Patient Active Problem List   Diagnosis Date Noted  . Acute ischemic stroke ([place]) 03/08/2020  . Diabetes mellitus type II, non insulin dependent ([place]) 03/08/2020  . Memory loss 03/08/2020  . Renal insufficiency 03/08/2020  . Hypertension   . Lung nodule, solitary 07/06/2014  . 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 AAA (abdominal aortic aneurysm) without rupture ([place]) 04/07/2013  . Abdominal aneurysm without mention of rupture 03/18/2012   Past Medical History:  Past Medical History:  Diagnosis Date  . AAA (abdominal aortic aneurysm) ([place])   . Arthritis    Gout  . Back pain   . CKD (chronic kidney disease)   . Diabetes mellitus without complication ([place])    type 2  . Encephalopathy acute   . GERD (gastroesophageal reflux disease)   . Gout   . Gout   . Hyperlipidemia   . Hypertension   . Lung nodule, solitary 2008   RLL, 8 mm in 2008, 14 mm in 2014  . Memory change   . Polyarthralgia   . Reflux   . Stroke [facility])    hx of multiple   Past Surgical History:  Past Surgical History:  Procedure Laterality Date  . ABDOMINAL AORTIC ENDOVASCULAR STENT GRAFT N/A 04/29/2013   Procedure: ABDOMINAL AORTIC ENDOVASCULAR STENT GRAFT -GORE;  Surgeon: Mal Misty, MD;  Location: [facility];  Service: Vascular;  Laterality: N/A;  . COLONOSCOPY  2010  . [place]  . TIBIA FRACTURE SURGERY Left   . VASECTOMY    . WRIST FRACTURE SURGERY Left 1970   HPI:  [name] [name] is a 74 y.o. male with medical history significant for chronic kidney disease, type 2 diabetes mellitus, AAA, hypertension, and hyperlipidemia. Presented to the ED for evaluation of confusion, hypoglycemia, and a fall.  MRI showed small acute infarcts involving the left frontal and occipital  lobes. Spouse states he has a history of memory loss but has not been formally diagnosed with dementia/Alzheimer's. Wife also reports that word-finding difficulty has been ongoing and intermittent for at least a month.    Assessment / Plan / Recommendation Clinical Impression  Pt was awake/alert and cooperative during SLE. He achieved a beside language score of 96/100 on the WAB. All areas appereared to be WNL for tasks assessed besides one instance of word-finding difficulty. Pt's family reports ongoing word-finding difficulty at baseline. Given this history, recommend f/u with outpatient SLP.    SLP Assessment  SLP Recommendation/Assessment: Patient needs continued Speech Lanaguage Pathology Services SLP Visit Diagnosis: Cognitive communication deficit (R41.841)    Follow Up Recommendations  Outpatient SLP    Frequency and Duration           SLP Evaluation Cognition  Overall Cognitive Status: History of cognitive impairments - at baseline Arousal/Alertness: Awake/alert Orientation Level: Oriented to person;Oriented to place;Oriented to situation Attention: Focused;Sustained;Selective Focused Attention: Appears intact Sustained Attention: Appears intact Selective Attention: Appears intact Memory: Appears intact Awareness: Appears intact Problem Solving: Appears intact       Comprehension  Auditory Comprehension Overall Auditory Comprehension: Appears within functional limits for tasks assessed Yes/No Questions: Within Functional Limits Commands: Within Functional Limits Conversation: Simple Visual Recognition/Discrimination Discrimination: Within Function Limits Reading Comprehension Reading Status: Within funtional limits    Expression Expression Primary Mode of Expression: Verbal Verbal Expression Overall Verbal  Expression: Impaired at baseline Initiation: No impairment Automatic Speech: Name;Social Response Level of Generative/Spontaneous Verbalization:  Word;Phrase;Sentence;Conversation Repetition: No impairment Naming: No impairment Pragmatics: No impairment Written Expression Dominant Hand: Right Written Expression: Within Functional Limits   Oral / Motor  Motor Speech Overall Motor Speech: Appears within functional limits for tasks assessed Respiration: Within functional limits Phonation: Normal Resonance: Within functional limits Articulation: Within functional limitis Intelligibility: Intelligible Motor Planning: Witnin functional limits Motor Speech Errors: Not applicable   GO                    [name] [name] 03/09/2020, 1:19 PM

## 2020-03-09 NOTE — Progress Notes (Addendum)
PROGRESS NOTE    [name] [name]  RJJ:[id number] DOB: June 16, 1946 DOA: 03/08/2020 PCP: [name] Flow, MD   Chief Complaint  Patient presents with  . Hypoglycemia  . Fall    Brief Narrative:  [name] [name] is [name] [name] 74 y.o. male with medical history significant for memory loss, chronic kidney disease, type 2 diabetes mellitus, AAA, hypertension, and hyperlipidemia, now presenting to the emergency department for evaluation of confusion, hypoglycemia, and [name] [name] fall. Patient reports that he was in his usual state of health throughout the day yesterday but last night began to feel drowsy and generally poor but without fevers, chills, headache, chest pain, palpitations, or shortness of breath at that time. He continued to feel sleepy today, did not have any appetite, and then fell in the bathroom, hitting his head but without losing consciousness. His wife noted that he seemed to be having difficulty finding the correct words to use and seemed to be confused. EMS was called. CBG was 52, improving with juice, but the speech difficulty and confusion persisted. Patient reports [name] [name] headache that he attributes to the fall, but notes that it resolved with acetaminophen. He denies any focal numbness or weakness.  ED Course: Upon arrival to the ED, patient is found to be afebrile, saturating well on room air, and with stable blood pressure. EKG features [name] [name] sinus rhythm with left axis deviation. Noncontrast head CT is negative for acute intracranial abnormality. Cervical spine CT negative for acute fracture or subluxation. CTA head and neck with extensive atheromatous disease involving the aorta and branch vessels, 30% narrowing of the innominate artery, 50% narrowing left carotid bulb, and 80 to 90% narrowing involving the proximal left ICA. MRI brain is concerning for small acute infarctions involving the left frontal and occipital lobes. Chemistry panel notable for glucose 191 and creatinine 1.46. CBC with [name] [name] leukocytosis to  12,700. COVID-19 screening test was negative. Patient was treated with Zofran and acetaminophen in the ED and neurology was consulted by the ED physician.  Assessment & Plan:   Principal Problem:   Acute ischemic stroke [name] [name]) Active Problems:   Diabetes mellitus type II, non insulin dependent ([name] [name])   Memory loss   Renal insufficiency   Hypertension  1. Acute ischemic CVA  - Presents with speech difficulty, confusion, and [name] [name] fall and is found to have acute ischemic infarctions on MRI involving left frontal and occipital lobes  - CTA head/neck with extensive atheromatous disease of the aorta and branch vessels.  30% luminal narrowing of innominate 2/2 atheromatous plaque.  50% narrowing of L carotid bulb and 80-90% narrowing of proximal L ICA 2/2 atheromaous plaque.  Mild R V1 and V4 segment narrowing 2/2 atheromatous diseae.  Mild narrowing of proximal R petrous and L paraclinoid ICA. - carotid US notable for L ICA velocities consistent with 60-79% stenosis, PSV at bulb suggest 80-99% stenosis.  - Neurology, recommending DAPT, crestor, vascular surgery consult - echo with EF 55-60%, no RWMA (see report) - Continue cardiac monitoring, frequent neuro checks  - PT/OT/SLP eval - LDL 95, a1c 6.8   # Carotid Artery Stenosis: planning for L CEA Friday with [name] [name]  2. Type II DM  - on glimepiride at home, currently on hold - A1c 6.8 - continue SSI   3. Renal insufficiency  - SCr is 1.46 on admission  - He has CKD on problem list but most recent prior labs available are from 2014 when SCr was 0.9   - Renally-dose medications, monitor  4. Hypertension  - BP normal in ED, hold antihypertensives for now in setting of acute ischemic CVA    5. Hyperlipidemia  - Continue Crestor, check fasting lipids    6. Memory loss  Etoh Abuse:  - Followed by neurology, treated with Aricept and Namenda  - follow thiamine level - on high dose thiamine x 3 days, then 100 mg daily - follow B12  (low normal, follow MMA) - continue folate supplementation - CIWA  7. OSA  - Patient reports CPAP intolerance   DVT prophylaxis: lovenox Code Status: full  Family Communication: none at bedside Disposition:   Status is: Observation  The patient will require care spanning > 2 midnights and should be moved to inpatient because: Inpatient level of care appropriate due to severity of illness  Dispo: The patient is from: Home              Anticipated d/c is to: Home              Anticipated d/c date is: > 3 days              Patient currently is not medically stable to d/c.       Consultants:   Neurology  Vascular surgery  Procedures:  Echo IMPRESSIONS    1. Left ventricular ejection fraction, by estimation, is 55 to 60%. The  left ventricle has normal function. The left ventricle has no regional  wall motion abnormalities. Left ventricular diastolic parameters were  normal.  2. Right ventricular systolic function is normal. The right ventricular  size is normal.  3. Left atrial size was mildly dilated.  4. The mitral valve is normal in structure. Mild mitral valve  regurgitation. No evidence of mitral stenosis.  5. The aortic valve is tricuspid. Aortic valve regurgitation is mild.  Mild aortic valve sclerosis is present, with no evidence of aortic valve  stenosis.  6. The inferior vena cava is normal in size with greater than 50%  respiratory variability, suggesting right atrial pressure of 3 mmHg.   Carotid US Summary:  Right Carotid: The extracranial vessels were near-normal with only minimal  wall         thickening or plaque.   Left Carotid: Velocities in the left ICA are consistent with [name] [name] 60-79%  stenosis.        PSV at the bulb level suggest 80-99% stenosis.     *See table(s) above for measurements and observations.    Antimicrobials Anti-infectives (From admission, onward)   None        Subjective: [name] [name]&Ox3 Asking when  he'll be able to go home  Objective: Vitals:   03/09/20 0400 03/09/20 0850 03/09/20 1245 03/09/20 1555  BP: (!) 123/54 139/60 137/64 (!) 143/60  Pulse: (!) 56 60 61 63  Resp:  16 16 16   Temp: 98.4 F (36.9 C) 97.7 F (36.5 C) 97.9 F (36.6 C) 98.2 F (36.8 C)  TempSrc: Oral Oral Oral Oral  SpO2: 97% 95% 94% 93%  Weight:      Height:        Intake/Output Summary (Last 24 hours) at 03/09/2020 1816 Last data filed at 03/09/2020 1500 Gross per 24 hour  Intake 100 ml  Output 900 ml  Net -800 ml   Filed Weights   03/09/20 0201  Weight: 84.1 kg    Examination:  General exam: Appears calm and comfortable  Respiratory system: Clear to auscultation. Respiratory effort normal. Cardiovascular system: S1 & S2 heard, RRR. Gastrointestinal  system: Abdomen is nondistended, soft and nontender.  Central nervous system: Alert and oriented x3.  5/5 strength upper and lower. \ Extremities: no LEE Skin: No rashes, lesions or ulcers Psychiatry: Judgement and insight appear normal. Mood & affect appropriate.     Data Reviewed: I have personally reviewed following labs and imaging studies  CBC: Recent Labs  Lab 03/08/20 1613 03/09/20 1000  WBC 12.7* 9.0  NEUTROABS 10.6*  --   HGB 14.0 12.1*  HCT 41.7 36.7*  MCV 91.0 90.2  PLT 187 161    Basic Metabolic Panel: Recent Labs  Lab 03/08/20 1613 03/09/20 0338 03/09/20 1000  NA 136  --  138  K 4.2  --  3.4*  CL 102  --  105  CO2 24  --  23  GLUCOSE 191*  --  203*  BUN 20  --  14  CREATININE 1.46*  --  1.33*  CALCIUM 9.7  --  9.1  MG  --  2.2  --     GFR: Estimated Creatinine Clearance: 48.7 mL/min ([name] [name]) (by C-G formula based on SCr of 1.33 mg/dL (H)).  Liver Function Tests: Recent Labs  Lab 03/08/20 1613 03/09/20 1000  AST 39 34  ALT 30 29  ALKPHOS 120 101  BILITOT 0.7 1.1  PROT 6.7 5.9*  ALBUMIN 3.9 3.3*    CBG: Recent Labs  Lab 03/08/20 1727 03/09/20 0215 03/09/20 0626 03/09/20 1312 03/09/20 1647    GLUCAP 173* 118* 93 118* 151*     Recent Results (from the past 240 hour(s))  SARS Coronavirus 2 by RT PCR (hospital order, performed in [name] hospital lab) Nasopharyngeal Nasopharyngeal Swab     Status: None   Collection Time: 03/08/20  5:03 PM   Specimen: Nasopharyngeal Swab  Result Value Ref Range Status   SARS Coronavirus 2 NEGATIVE NEGATIVE Final    Comment: (NOTE) SARS-CoV-2 target nucleic acids are NOT DETECTED.  The SARS-CoV-2 RNA is generally detectable in upper and lower respiratory specimens during the acute phase of infection. The lowest concentration of SARS-CoV-2 viral copies this assay can detect is 250 copies / mL. Rebeca Valdivia negative result does not preclude SARS-CoV-2 infection and should not be used as the sole basis for treatment or other patient management decisions.  Haik Mahoney negative result may occur with improper specimen collection / handling, submission of specimen other than nasopharyngeal swab, presence of viral mutation(s) within the areas targeted by this assay, and inadequate number of viral copies (<250 copies / mL). Dniyah Grant negative result must be combined with clinical observations, patient history, and epidemiological information.  Fact Sheet for Patients:   StrictlyIdeas.no  Fact Sheet for Healthcare Providers: BankingDealers.co.za  This test is not yet approved or  cleared by the [place] FDA and has been authorized for detection and/or diagnosis of SARS-CoV-2 by FDA under an Emergency Use Authorization (EUA).  This EUA will remain in effect (meaning this test can be used) for the duration of the COVID-19 declaration under Section 564(b)(1) of the Act, 21 U.S.C. section 360bbb-3(b)(1), unless the authorization is terminated or revoked sooner.  Performed at [name], [place] [address] [place], [place] [address]          Radiology Studies: CT Angio Head W or Wo Contrast  Result Date:  03/08/2020 CLINICAL DATA:  Neuro deficit EXAM: CT ANGIOGRAPHY HEAD AND NECK TECHNIQUE: Multidetector CT imaging of the head and neck was performed using the standard protocol during bolus administration of intravenous contrast. Multiplanar CT  image reconstructions and MIPs were obtained to evaluate the vascular anatomy. Carotid stenosis measurements (when applicable) are obtained utilizing NASCET criteria, using the distal internal carotid diameter as the denominator. CONTRAST:  88mL OMNIPAQUE IOHEXOL 350 MG/ML SOLN COMPARISON:  03/08/2020 head CT and prior. FINDINGS: CTA NECK FINDINGS Aortic arch: Standard branching. Imaged portion shows no evidence of aneurysm or dissection. Extensive noncalcified atheromatous plaque involving the aortic arch and great vessel origins. Some of the plaque demonstrates Kai Calico pedunculated appearance. Approximately 30% luminal narrowing of the innominate artery secondary to atheromatous disease. No significant stenosis of the left common carotid and left subclavian artery origins. Right carotid system: Noncalcified atheromatous disease involving the proximal right common carotid artery without significant luminal narrowing. Carotid bifurcation calcified atheromatous plaque without high-grade narrowing. Mild narrowing of the proximal right petrous segment secondary to noncalcified atheromatous plaque. Left carotid system: Patent normal caliber common carotid artery. Carotid bifurcation atherosclerotic calcifications with approximately 80-90% narrowing of the proximal ICA. The carotid bulb is narrowed by approximately 50%. Vertebral arteries: Dominant right vertebral artery. Right V1 and V4 segment calcified atheromatous plaque with mild narrowing. Left vertebral artery atheromatous disease with no greater than mild narrowing. Skeleton: Multilevel spondylosis.  No acute osseous abnormalities. Other neck: No adenopathy.  No soft tissue mass. Upper chest: Emphysema. Dependent atelectasis.  Motion artifact limits evaluation. Review of the MIP images confirms the above findings CTA HEAD FINDINGS Anterior circulation: Bilateral carotid siphon atherosclerotic calcifications. Mild narrowing of the left paraclinoid ICA. Patent, normal caliber appearance of the anterior and middle cerebral arteries. Posterior circulation: Dominant right vertebral artery. Normal caliber patent basilar, superior cerebellar and posterior cerebral arteries. Venous sinuses: As permitted by contrast timing, patent. Anatomic variants: None. 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 30% luminal narrowing of the innominate secondary to atheromatous plaque. Approximately 50% narrowing of the left carotid bulb and 80-90% narrowing of the proximal left ICA secondary to atheromatous plaque. Mild right V1 and V4 segment narrowing secondary to atheromatous disease. Mild narrowing of the proximal right petrous and left paraclinoid ICA. Electronically Signed   By: Primitivo Gauze M.D.   On: 03/08/2020 19:00   CT Head Wo Contrast  Result Date: 03/08/2020 CLINICAL DATA:  Head trauma, minor (Age >= 65y) Post fall with vomiting. EXAM: CT HEAD WITHOUT CONTRAST TECHNIQUE: Contiguous axial images were obtained from the base of the skull through the vertex without intravenous contrast. COMPARISON:  Head CT 12/05/2019, brain MRI 12/07/2019 FINDINGS: Brain: No acute hemorrhage or evidence of acute ischemia. Stable degree of atrophy, ventricular dilatation, and periventricular chronic small vessel ischemia. Remote lacunar infarcts in the left greater than right basal ganglia. No midline shift or mass effect. No subdural or extra-axial collection. Vascular: Atherosclerosis of skullbase vasculature without hyperdense vessel or abnormal calcification. Skull: No fracture or focal lesion. Sinuses/Orbits: Minimal chronic mucosal thickening paranasal  sinuses. No sinus fluid level or acute findings. The mastoid air cells are clear. Orbits are unremarkable. Other: None. IMPRESSION: 1. No acute intracranial abnormality. No skull fracture. 2. Stable atrophy, chronic small vessel ischemia, and remote basal gangliar lacunar infarcts. Electronically Signed   By: Keith Rake M.D.   On: 03/08/2020 17:00   CT Angio Neck W and/or Wo Contrast  Result Date: 03/08/2020 CLINICAL DATA:  Neuro deficit EXAM: CT ANGIOGRAPHY HEAD AND NECK TECHNIQUE: Multidetector CT imaging of the head and neck was performed using the standard protocol during bolus administration of intravenous  contrast. Multiplanar CT image reconstructions and MIPs were obtained to evaluate the vascular anatomy. Carotid stenosis measurements (when applicable) are obtained utilizing NASCET criteria, using the distal internal carotid diameter as the denominator. CONTRAST:  51mL OMNIPAQUE IOHEXOL 350 MG/ML SOLN COMPARISON:  03/08/2020 head CT and prior. FINDINGS: CTA NECK FINDINGS Aortic arch: Standard branching. Imaged portion shows no evidence of aneurysm or dissection. Extensive noncalcified atheromatous plaque involving the aortic arch and great vessel origins. Some of the plaque demonstrates Romeka Scifres pedunculated appearance. Approximately 30% luminal narrowing of the innominate artery secondary to atheromatous disease. No significant stenosis of the left common carotid and left subclavian artery origins. Right carotid system: Noncalcified atheromatous disease involving the proximal right common carotid artery without significant luminal narrowing. Carotid bifurcation calcified atheromatous plaque without high-grade narrowing. Mild narrowing of the proximal right petrous segment secondary to noncalcified atheromatous plaque. Left carotid system: Patent normal caliber common carotid artery. Carotid bifurcation atherosclerotic calcifications with approximately 80-90% narrowing of the proximal ICA. The carotid bulb  is narrowed by approximately 50%. Vertebral arteries: Dominant right vertebral artery. Right V1 and V4 segment calcified atheromatous plaque with mild narrowing. Left vertebral artery atheromatous disease with no greater than mild narrowing. Skeleton: Multilevel spondylosis.  No acute osseous abnormalities. Other neck: No adenopathy.  No soft tissue mass. Upper chest: Emphysema. Dependent atelectasis. Motion artifact limits evaluation. Review of the MIP images confirms the above findings CTA HEAD FINDINGS Anterior circulation: Bilateral carotid siphon atherosclerotic calcifications. Mild narrowing of the left paraclinoid ICA. Patent, normal caliber appearance of the anterior and middle cerebral arteries. Posterior circulation: Dominant right vertebral artery. Normal caliber patent basilar, superior cerebellar and posterior cerebral arteries. Venous sinuses: As permitted by contrast timing, patent. Anatomic variants: None. 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 30% luminal narrowing of the innominate secondary to atheromatous plaque. Approximately 50% narrowing of the left carotid bulb and 80-90% narrowing of the proximal left ICA secondary to atheromatous plaque. Mild right V1 and V4 segment narrowing secondary to atheromatous disease. Mild narrowing of the proximal right petrous and left paraclinoid ICA. Electronically Signed   By: Primitivo Gauze M.D.   On: 03/08/2020 19:00   CT Cervical Spine Wo Contrast  Result Date: 03/08/2020 CLINICAL DATA:  Neck trauma (Age >= 65y) Post fall with vomiting. EXAM: CT CERVICAL SPINE WITHOUT CONTRAST TECHNIQUE: Multidetector CT imaging of the cervical spine was performed without intravenous contrast. Multiplanar CT image reconstructions were also generated. COMPARISON:  None. FINDINGS: Alignment: Normal. Skull base and vertebrae: No acute fracture. Vertebral body heights  are maintained. The dens and skull base are intact. Soft tissues and spinal canal: No prevertebral fluid or swelling. No visible canal hematoma. Disc levels: Disc space narrowing and endplate spurring at P5-K9. There is multilevel endplate spurring at additional levels with preservation of disc spaces. Facet hypertrophy at C4-C5 on the left and multilevel on the right. Upper chest: Breathing motion artifact. No evidence of acute abnormality. Other: Carotid calcifications. IMPRESSION: Degenerative change in the cervical spine without acute fracture or subluxation. Electronically Signed   By: Keith Rake M.D.   On: 03/08/2020 17:04   MR BRAIN WO CONTRAST  Result Date: 03/08/2020 CLINICAL DATA:  Neuro deficit EXAM: MRI HEAD WITHOUT CONTRAST TECHNIQUE: Multiplanar, multiecho pulse sequences of the brain and surrounding structures were obtained without intravenous contrast. COMPARISON:  Concurrent CTA head and neck. 12/07/2019 MRI head and prior. FINDINGS: Brain: Moderate cerebral atrophy with ex  vacuo dilatation. Scattered and confluent T2/FLAIR hyperintense foci involving the periventricular and subcortical white matter are nonspecific however commonly associated with chronic microvascular ischemic changes. Small foci of restricted diffusion involving the left frontal lobe at the vertex and left occipital lobe (2:20, 44). No intracranial hemorrhage. No midline shift, mass lesion or extra-axial fluid collection. Sequela of remote bilateral basal ganglia, thalamic and cerebellar insults. Vascular: Please see concurrent CTA Skull and upper cervical spine: Normal marrow signal. Sinuses/Orbits: Normal orbits. Mild pansinus disease. No mastoid effusion. Other: None. IMPRESSION: Small acute infarcts involving the left frontal and occipital lobes. Moderate cerebral atrophy and chronic microvascular ischemic changes. Remote bilateral basal ganglia, thalamic and cerebellar insults. Mild pansinus disease. These results  were called by telephone at the time of interpretation on 03/08/2020 at 7:13 pm to provider ADAM CURATOLO , who verbally acknowledged these results. Electronically Signed   By: Primitivo Gauze M.D.   On: 03/08/2020 19:15   MR CERVICAL SPINE WO CONTRAST  Result Date: 03/08/2020 CLINICAL DATA:  Initial evaluation for acute myelopathy. EXAM: MRI CERVICAL AND THORACIC SPINE WITHOUT CONTRAST TECHNIQUE: Multiplanar and multiecho pulse sequences of the cervical spine, to include the craniocervical junction and cervicothoracic junction, and the thoracic spine, were obtained without intravenous contrast. COMPARISON:  None available. FINDINGS: MRI CERVICAL SPINE FINDINGS Examination degraded by motion artifact. Alignment: Physiologic with preservation of the normal cervical lordosis. No listhesis. Vertebrae: Vertebral body height maintained without acute or chronic fracture. Bone marrow signal intensity within normal limits. No worrisome osseous lesions. Minimal reactive marrow edema seen about the left C6-7 facet due to facet arthritis. No other abnormal marrow edema. Cord: Normal signal and morphology. Posterior Fossa, vertebral arteries, paraspinal tissues: Visualized brain and posterior fossa within normal limits. Craniocervical junction normal. Paraspinous and prevertebral soft tissues within normal limits. Normal intravascular [name] voids seen within the vertebral arteries bilaterally. Disc levels: C2-C3: Negative interspace. Right greater than left facet hypertrophy with associated ankylosis on the right. No spinal stenosis. Foramina remain patent. C3-C4: Minimal disc bulge with uncovertebral hypertrophy. Moderate right greater than left facet and ligament flavum hypertrophy. No significant spinal stenosis. Moderate bilateral C4 foraminal narrowing. C4-C5: Mild disc bulge with uncovertebral hypertrophy. Superimposed small central/right paracentral disc protrusion mildly indents the ventral thecal sac (series 5,  image 20). Moderate right with mild left facet hypertrophy. No significant spinal stenosis. Moderate left with mild to moderate right C5 foraminal stenosis. C5-C6: Degenerative intervertebral disc space narrowing with diffuse disc bulge and bilateral uncovertebral hypertrophy. Moderate left with mild right facet degeneration with associated ankylosis on the left. No significant spinal stenosis. Moderate left with mild right C6 foraminal narrowing. C6-C7: Mild annular disc bulge. Mild to moderate bilateral facet hypertrophy. No significant spinal stenosis. Foramina remain patent. C7-T1: Minimal disc bulge. Mild facet hypertrophy. No canal or foraminal stenosis. MRI THORACIC SPINE FINDINGS Examination degraded by motion artifact. Alignment: Mild scoliosis with exaggeration of the normal thoracic kyphosis. No listhesis. Vertebrae: Vertebral body height maintained without acute or chronic fracture. Bone marrow signal intensity within normal limits. Few scattered benign hemangiomata noted. No worrisome osseous lesions. No abnormal marrow edema. Cord: Normal signal and morphology. No convincing cord signal abnormality on this motion degraded exam. Conus medullaris terminates at approximately the L1 level. Paraspinal and other soft tissues: Negative. Disc levels: No significant degenerative disc disease seen for patient age. No disc bulge or focal disc herniation. No significant canal or foraminal stenosis. No neural impingement. IMPRESSION: 1. Normal MRI of the cervicothoracic spinal cord. No  cord signal changes to suggest myelopathy or other abnormality. 2. Mild multilevel cervical spondylosis and facet arthrosis without significant spinal stenosis. Associated mild to moderate bilateral C4 through C6 foraminal narrowing as above. 3. No significant disc pathology within the thoracic spine for patient age. No thoracic spinal stenosis or neural impingement. Electronically Signed   By: [name] [name] M.D.   On:  03/08/2020 23:30   MR THORACIC SPINE WO CONTRAST  Result Date: 03/08/2020 CLINICAL DATA:  Initial evaluation for acute myelopathy. EXAM: MRI CERVICAL AND THORACIC SPINE WITHOUT CONTRAST TECHNIQUE: Multiplanar and multiecho pulse sequences of the cervical spine, to include the craniocervical junction and cervicothoracic junction, and the thoracic spine, were obtained without intravenous contrast. COMPARISON:  None available. FINDINGS: MRI CERVICAL SPINE FINDINGS Examination degraded by motion artifact. Alignment: Physiologic with preservation of the normal cervical lordosis. No listhesis. Vertebrae: Vertebral body height maintained without acute or chronic fracture. Bone marrow signal intensity within normal limits. No worrisome osseous lesions. Minimal reactive marrow edema seen about the left C6-7 facet due to facet arthritis. No other abnormal marrow edema. Cord: Normal signal and morphology. Posterior Fossa, vertebral arteries, paraspinal tissues: Visualized brain and posterior fossa within normal limits. Craniocervical junction normal. Paraspinous and prevertebral soft tissues within normal limits. Normal intravascular [name] voids seen within the vertebral arteries bilaterally. Disc levels: C2-C3: Negative interspace. Right greater than left facet hypertrophy with associated ankylosis on the right. No spinal stenosis. Foramina remain patent. C3-C4: Minimal disc bulge with uncovertebral hypertrophy. Moderate right greater than left facet and ligament flavum hypertrophy. No significant spinal stenosis. Moderate bilateral C4 foraminal narrowing. C4-C5: Mild disc bulge with uncovertebral hypertrophy. Superimposed small central/right paracentral disc protrusion mildly indents the ventral thecal sac (series 5, image 20). Moderate right with mild left facet hypertrophy. No significant spinal stenosis. Moderate left with mild to moderate right C5 foraminal stenosis. C5-C6: Degenerative intervertebral disc space  narrowing with diffuse disc bulge and bilateral uncovertebral hypertrophy. Moderate left with mild right facet degeneration with associated ankylosis on the left. No significant spinal stenosis. Moderate left with mild right C6 foraminal narrowing. C6-C7: Mild annular disc bulge. Mild to moderate bilateral facet hypertrophy. No significant spinal stenosis. Foramina remain patent. C7-T1: Minimal disc bulge. Mild facet hypertrophy. No canal or foraminal stenosis. MRI THORACIC SPINE FINDINGS Examination degraded by motion artifact. Alignment: Mild scoliosis with exaggeration of the normal thoracic kyphosis. No listhesis. Vertebrae: Vertebral body height maintained without acute or chronic fracture. Bone marrow signal intensity within normal limits. Few scattered benign hemangiomata noted. No worrisome osseous lesions. No abnormal marrow edema. Cord: Normal signal and morphology. No convincing cord signal abnormality on this motion degraded exam. Conus medullaris terminates at approximately the L1 level. Paraspinal and other soft tissues: Negative. Disc levels: No significant degenerative disc disease seen for patient age. No disc bulge or focal disc herniation. No significant canal or foraminal stenosis. No neural impingement. IMPRESSION: 1. Normal MRI of the cervicothoracic spinal cord. No cord signal changes to suggest myelopathy or other abnormality. 2. Mild multilevel cervical spondylosis and facet arthrosis without significant spinal stenosis. Associated mild to moderate bilateral C4 through C6 foraminal narrowing as above. 3. No significant disc pathology within the thoracic spine for patient age. No thoracic spinal stenosis or neural impingement. Electronically Signed   By: [name] [name] M.D.   On: 03/08/2020 23:30   ECHOCARDIOGRAM COMPLETE  Result Date: 03/09/2020    ECHOCARDIOGRAM REPORT   Patient Name:   EDILSON VITAL Date of Exam: 03/09/2020 Medical Rec #:  [id number]     Height:       69.0 in  Accession #:    [id number]    Weight:       185.4 lb Date of Birth:  02-25-1946     BSA:          2.001 m Patient Age:    45 years      BP:           132/75 mmHg Patient Gender: M             HR:           71 bpm. Exam Location:  Inpatient Procedure: 2D Echo, Cardiac Doppler and Color Doppler Indications:    Stroke 434.91 / I163.9  History:        Patient has no prior history of Echocardiogram examinations.                 Risk Factors:Hypertension, Dyslipidemia and Diabetes. GERD. CKD.                 Abdominal Aortic Aneurysm.  Sonographer:    Jonelle Sidle Dance Referring Phys: [id number] Saunders  1. Left ventricular ejection fraction, by estimation, is 55 to 60%. The left ventricle has normal function. The left ventricle has no regional wall motion abnormalities. Left ventricular diastolic parameters were normal.  2. Right ventricular systolic function is normal. The right ventricular size is normal.  3. Left atrial size was mildly dilated.  4. The mitral valve is normal in structure. Mild mitral valve regurgitation. No evidence of mitral stenosis.  5. The aortic valve is tricuspid. Aortic valve regurgitation is mild. Mild aortic valve sclerosis is present, with no evidence of aortic valve stenosis.  6. The inferior vena cava is normal in size with greater than 50% respiratory variability, suggesting right atrial pressure of 3 mmHg. FINDINGS  Left Ventricle: Left ventricular ejection fraction, by estimation, is 55 to 60%. The left ventricle has normal function. The left ventricle has no regional wall motion abnormalities. The left ventricular internal cavity size was normal in size. There is  no left ventricular hypertrophy. Left ventricular diastolic parameters were normal. Right Ventricle: The right ventricular size is normal. No increase in right ventricular wall thickness. Right ventricular systolic function is normal. Left Atrium: Left atrial size was mildly dilated. Right Atrium: Right atrial  size was normal in size. Pericardium: There is no evidence of pericardial effusion. Mitral Valve: The mitral valve is normal in structure. Mild mitral valve regurgitation. No evidence of mitral valve stenosis. Tricuspid Valve: The tricuspid valve is normal in structure. Tricuspid valve regurgitation is not demonstrated. No evidence of tricuspid stenosis. Aortic Valve: The aortic valve is tricuspid. Aortic valve regurgitation is mild. Aortic regurgitation PHT measures 390 msec. Mild aortic valve sclerosis is present, with no evidence of aortic valve stenosis. Pulmonic Valve: The pulmonic valve was normal in structure. Pulmonic valve regurgitation is not visualized. No evidence of pulmonic stenosis. Aorta: The aortic root is normal in size and structure. Venous: The inferior vena cava is normal in size with greater than 50% respiratory variability, suggesting right atrial pressure of 3 mmHg. IAS/Shunts: No atrial level shunt detected by color [name] Doppler.  LEFT VENTRICLE PLAX 2D LVIDd:         5.30 cm LVIDs:         3.80 cm LV PW:         1.10 cm LV IVS:  1.10 cm LVOT diam:     2.20 cm LV SV:         90 LV SV Index:   45 LVOT Area:     3.80 cm  RIGHT VENTRICLE          IVC RV Basal diam:  1.90 cm  IVC diam: 2.00 cm TAPSE (M-mode): 1.7 cm LEFT ATRIUM             Index       RIGHT ATRIUM          Index LA diam:        4.20 cm 2.10 cm/m  RA Area:     9.05 cm LA Vol (A2C):   85.8 ml 42.89 ml/m RA Volume:   13.90 ml 6.95 ml/m LA Vol (A4C):   66.5 ml 33.24 ml/m LA Biplane Vol: 76.3 ml 38.14 ml/m  AORTIC VALVE LVOT Vmax:   111.50 cm/s LVOT Vmean:  73.400 cm/s LVOT VTI:    0.236 m AI PHT:      390 msec  AORTA Ao Root diam: 3.40 cm Ao Asc diam:  3.50 cm MITRAL VALVE MV Area (PHT): 2.95 cm    SHUNTS MV Decel Time: 257 msec    Systemic VTI:  0.24 m MV E velocity: 90.40 cm/s  Systemic Diam: 2.20 cm MV Kyrstal Monterrosa velocity: 80.60 cm/s MV E/Rooney Swails ratio:  1.12 Jenkins Rouge MD Electronically signed by Jenkins Rouge MD Signature  Date/Time: 03/09/2020/11:14:58 AM    Final    VAS US CAROTID  Result Date: 03/09/2020 Carotid Arterial Duplex Study Indications:       CVA and Aphasia. History of carotid stenosis. Risk Factors:      Hypertension, hyperlipidemia. Other Factors:     Abdominal Aortic Endovascular Stent Graft in 04/2013. Comparison Study:  CTA of the neck on 03/08/2020 showed right ICA withoug                    significant stenosis. Left proximal ICA 80-90% stenosis with                    approximately 50% narrowing at the bulb.                    Carotid US on 05/31/2016 Performing Technologist: Velva Harman Sturdivant RDMS, RVT  Examination Guidelines: [name] [name] complete evaluation includes B-mode imaging, spectral Doppler, color Doppler, and power Doppler as needed of all accessible portions of each vessel. Bilateral testing is considered an integral part of [name] [name] complete examination. Limited examinations for reoccurring indications may be performed as noted.  Right Carotid Findings: +----------+--------+--------+--------+------------------+--------+           PSV cm/sEDV cm/sStenosisPlaque DescriptionComments +----------+--------+--------+--------+------------------+--------+ CCA Prox  81      17                                         +----------+--------+--------+--------+------------------+--------+ CCA Distal75      17                                         +----------+--------+--------+--------+------------------+--------+ ICA Prox  81      19                                         +----------+--------+--------+--------+------------------+--------+  ICA Distal81      20                                         +----------+--------+--------+--------+------------------+--------+ ECA       136     12                                         +----------+--------+--------+--------+------------------+--------+ +----------+--------+-------+----------------+-------------------+           PSV cm/sEDV  cmsDescribe        Arm Pressure (mmHG) +----------+--------+-------+----------------+-------------------+ Subclavian206            Multiphasic, WNL                    +----------+--------+-------+----------------+-------------------+ +---------+--------+--+--------+--+---------+ VertebralPSV cm/s64EDV cm/s19Antegrade +---------+--------+--+--------+--+---------+  Left Carotid Findings: +----------+--------+--------+--------+------------------+--------+           PSV cm/sEDV cm/sStenosisPlaque DescriptionComments +----------+--------+--------+--------+------------------+--------+ CCA Prox  121     21                                         +----------+--------+--------+--------+------------------+--------+ CCA Distal75      18                                         +----------+--------+--------+--------+------------------+--------+ ICA Prox  422     76      60-79%  homogeneous                +----------+--------+--------+--------+------------------+--------+ ICA Mid   91      30                                         +----------+--------+--------+--------+------------------+--------+ ICA Distal62      23                                         +----------+--------+--------+--------+------------------+--------+ ECA       243     27                                         +----------+--------+--------+--------+------------------+--------+ +----------+--------+--------+----------------+-------------------+           PSV cm/sEDV cm/sDescribe        Arm Pressure (mmHG) +----------+--------+--------+----------------+-------------------+ Subclavian170             Multiphasic, WNL                    +----------+--------+--------+----------------+-------------------+ +---------+--------+--+--------+--+---------+ VertebralPSV cm/s62EDV cm/s14Antegrade +---------+--------+--+--------+--+---------+ On 2D imaging the narrowing appears to be at  the bulb/origin level with PSV suggest 80-99% stenosis. However, EDV suggest less - 60-79% stenosis.  Summary: Right Carotid: The extracranial vessels were near-normal with only minimal wall                thickening or plaque. Left Carotid: Velocities in  the left ICA are consistent with [name] [name] 60-79% stenosis.               PSV at the bulb level suggest 80-99% stenosis.  *See table(s) above for measurements and observations.  Electronically signed by Monica Martinez MD on 03/09/2020 at 4:45:36 PM.    Final         Scheduled Meds: . [START ON 03/10/2020] aspirin  81 mg Oral Daily  . Chlorhexidine Gluconate Cloth  6 each Topical Daily  . citalopram  20 mg Oral Daily  . [START ON 03/10/2020] clopidogrel  75 mg Oral Daily  . donepezil  10 mg Oral QHS  . enoxaparin (LOVENOX) injection  40 mg Subcutaneous QHS  . folic acid  1 mg Oral Daily  . insulin aspart  0-5 Units Subcutaneous QHS  . insulin aspart  0-9 Units Subcutaneous TID WC  . LORazepam  0-4 mg Intravenous Q6H   Followed by  . [START ON 03/10/2020] LORazepam  0-4 mg Intravenous Q12H  . memantine  10 mg Oral BID  . multivitamin with minerals  1 tablet Oral Daily  . rosuvastatin  40 mg Oral q1800  . [START ON 03/12/2020] thiamine injection  100 mg Intravenous Daily  . [START ON 03/10/2020] vitamin B-12  1,000 mcg Oral Daily   Continuous Infusions: . thiamine injection 400 mg (03/09/20 1746)     LOS: 0 days    Time spent: over 30 min    Fayrene Helper, MD [facility]   To contact the attending provider between 7A-7P or the covering provider during after hours 7P-7A, please log into the web site [email or web address] and access using universal [place] password for that web site. If you do not have the password, please call the hospital operator.  03/09/2020, 6:16 PM

## 2020-03-09 NOTE — Progress Notes (Signed)
Ortho statics:  Lying VS: HR 66 O2 sats 96% on room air BP 146/64  Standing VS: HR 79 O2 98% on room air BP 142/74

## 2020-03-10 ENCOUNTER — Inpatient Hospital Stay (HOSPITAL_COMMUNITY): Payer: Medicare PPO

## 2020-03-10 LAB — COMPREHENSIVE METABOLIC PANEL
ALT: 33 U/L (ref 0–44)
AST: 31 U/L (ref 15–41)
Albumin: 3.4 g/dL — ABNORMAL LOW (ref 3.5–5.0)
Alkaline Phosphatase: 98 U/L (ref 38–126)
Anion gap: 9 (ref 5–15)
BUN: 12 mg/dL (ref 8–23)
CO2: 24 mmol/L (ref 22–32)
Calcium: 9.3 mg/dL (ref 8.9–10.3)
Chloride: 107 mmol/L (ref 98–111)
Creatinine, Ser: 1.2 mg/dL (ref 0.61–1.24)
GFR calc Af Amer: >60 mL/min (ref >60)
GFR calc non Af Amer: 59 mL/min — ABNORMAL LOW (ref >60)
Glucose, Bld: 106 mg/dL — ABNORMAL HIGH (ref 70–99)
Potassium: 3.4 mmol/L — ABNORMAL LOW (ref 3.5–5.1)
Sodium: 140 mmol/L (ref 135–145)
Total Bilirubin: 1 mg/dL (ref 0.3–1.2)
Total Protein: 6.3 g/dL — ABNORMAL LOW (ref 6.5–8.1)

## 2020-03-10 LAB — GLUCOSE, CAPILLARY
Glucose-Capillary: 116 mg/dL — ABNORMAL HIGH (ref 70–99)
Glucose-Capillary: 161 mg/dL — ABNORMAL HIGH (ref 70–99)
Glucose-Capillary: 139 mg/dL — ABNORMAL HIGH (ref 70–99)
Glucose-Capillary: 148 mg/dL — ABNORMAL HIGH (ref 70–99)

## 2020-03-10 LAB — CBC WITH DIFFERENTIAL/PLATELET
Abs Immature Granulocytes: 0.03 10*3/uL (ref 0.00–0.07)
Basophils Absolute: 0 10*3/uL (ref 0.0–0.1)
Basophils Relative: 1 %
Eosinophils Absolute: 0.3 10*3/uL (ref 0.0–0.5)
Eosinophils Relative: 3 %
HCT: 37.6 % — ABNORMAL LOW (ref 39.0–52.0)
Hemoglobin: 12.4 g/dL — ABNORMAL LOW (ref 13.0–17.0)
Immature Granulocytes: 0 %
Lymphocytes Relative: 17 %
Lymphs Abs: 1.4 10*3/uL (ref 0.7–4.0)
MCH: 30.3 pg (ref 26.0–34.0)
MCHC: 33 g/dL (ref 30.0–36.0)
MCV: 91.9 fL (ref 80.0–100.0)
Monocytes Absolute: 1 10*3/uL (ref 0.1–1.0)
Monocytes Relative: 11 %
Neutro Abs: 5.8 10*3/uL (ref 1.7–7.7)
Neutrophils Relative %: 68 %
Platelets: 148 10*3/uL — ABNORMAL LOW (ref 150–400)
RBC: 4.09 MIL/uL — ABNORMAL LOW (ref 4.22–5.81)
RDW: 13.1 % (ref 11.5–15.5)
WBC: 8.5 10*3/uL (ref 4.0–10.5)
nRBC: 0 % (ref 0.0–0.2)

## 2020-03-10 LAB — VITAMIN B1: Vitamin B1 (Thiamine): 274.2 nmol/L — ABNORMAL HIGH (ref 66.5–200.0)

## 2020-03-10 LAB — PHOSPHORUS: Phosphorus: 2.7 mg/dL (ref 2.5–4.6)

## 2020-03-10 LAB — MAGNESIUM: Magnesium: 2 mg/dL (ref 1.7–2.4)

## 2020-03-10 IMAGING — DX DG CHEST 1V PORT
2 series · 2 of 2 positions shown · non-contrast
Comparison: Radiograph [DATE]

CLINICAL DATA: Cough.

EXAM:
PORTABLE CHEST 1 VIEW

[chest ap (1 of 2)]
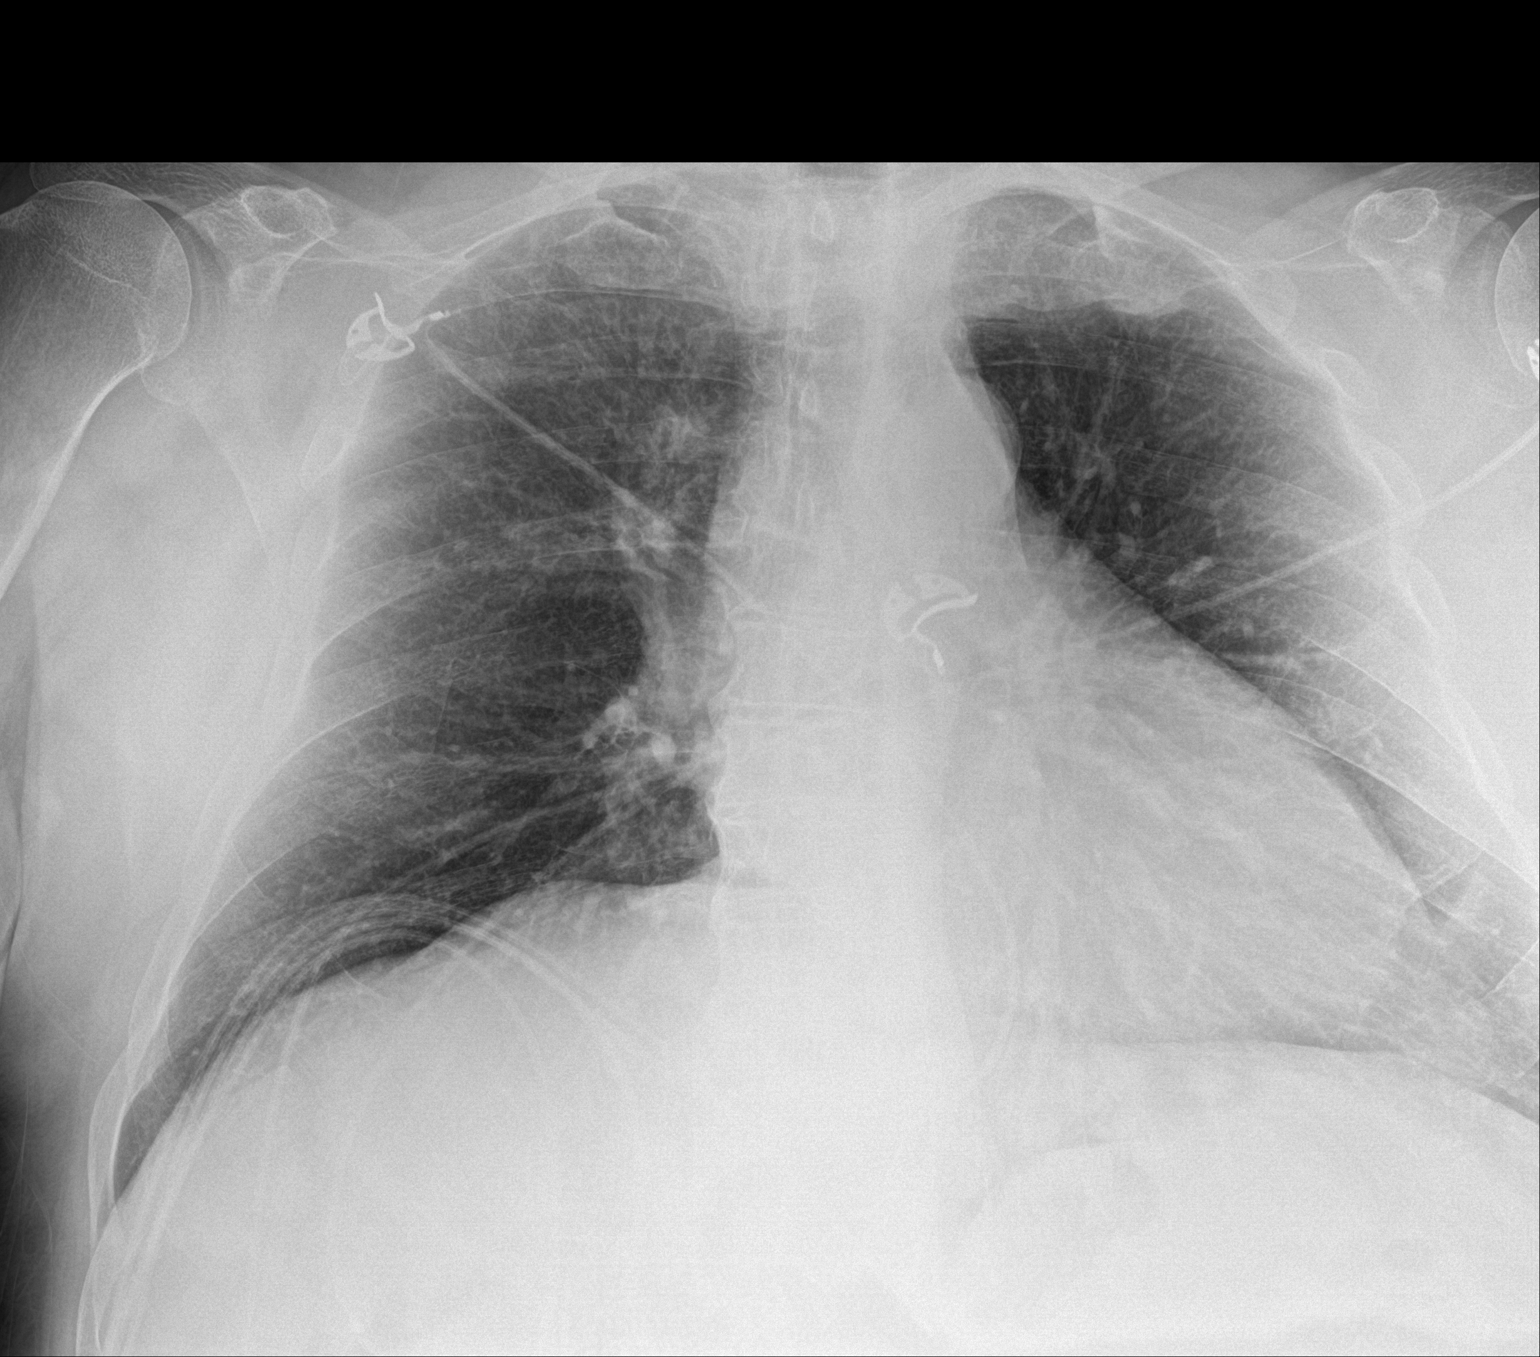

[chest ap (2 of 2)]
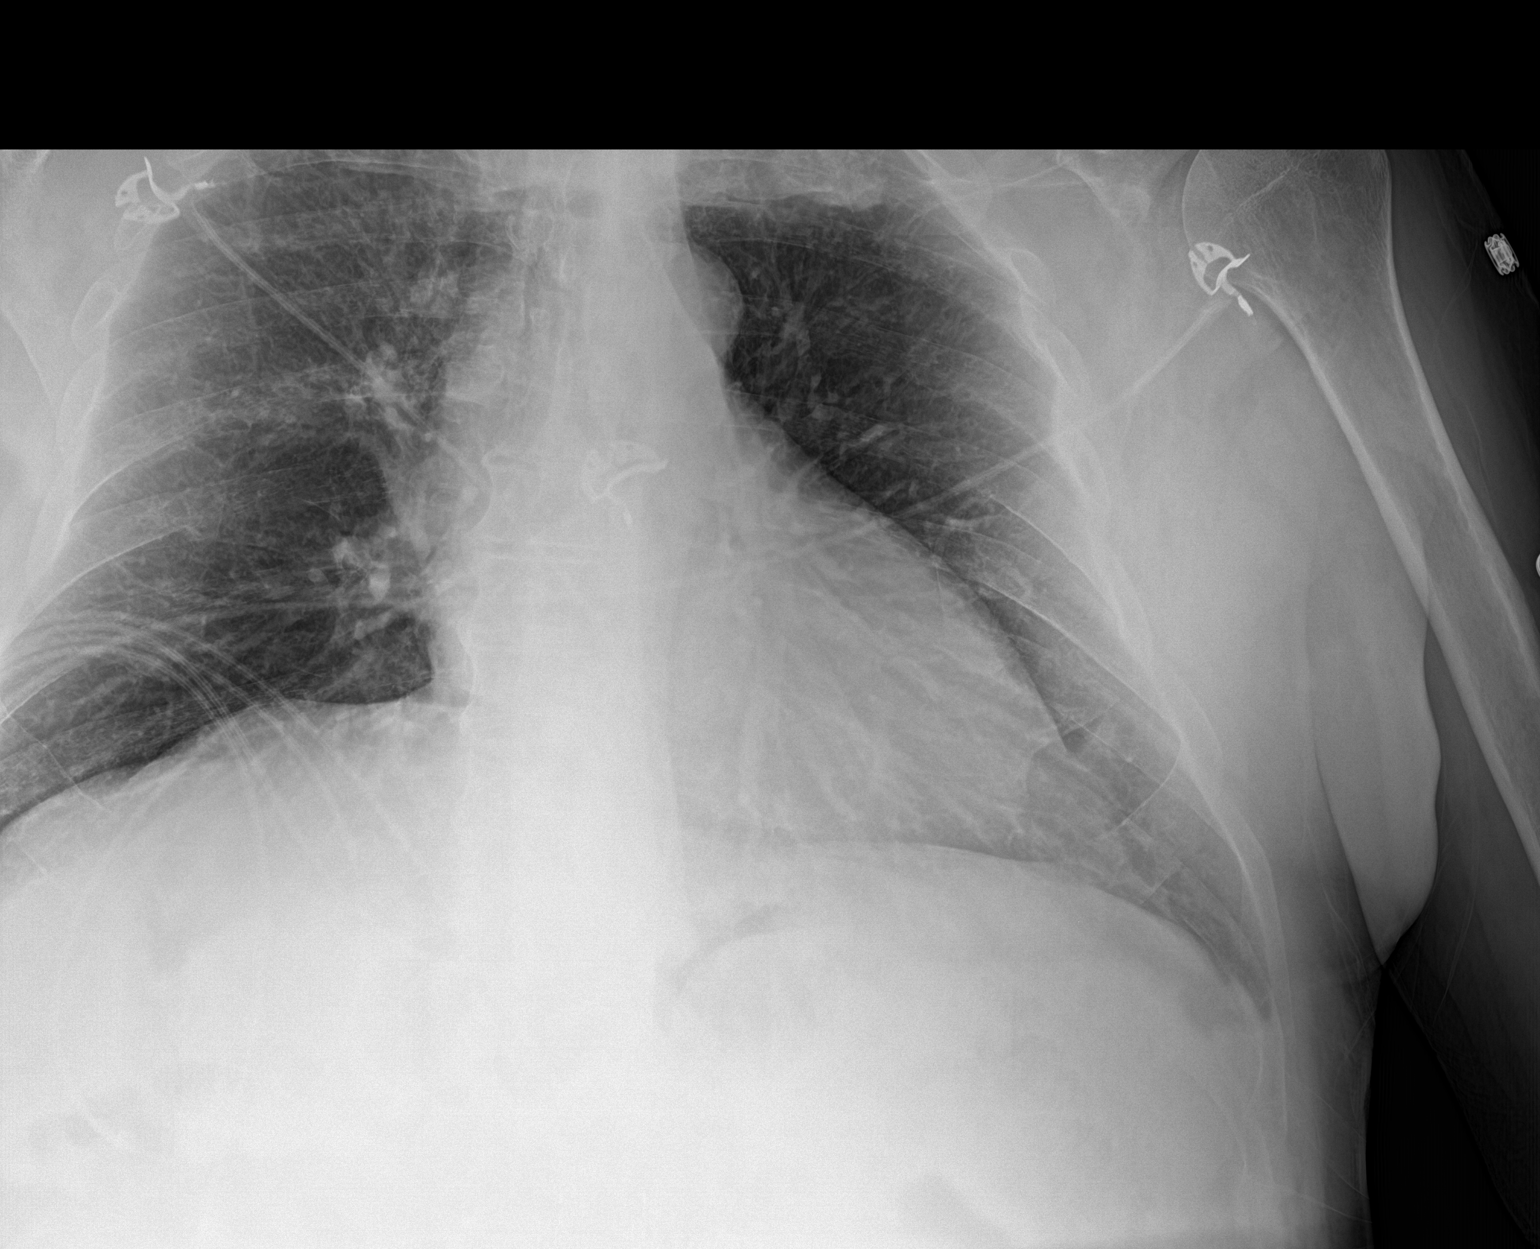

[2 of 2 positions shown; findings below may reference images not displayed]

FINDINGS: Mild cardiomegaly, similar to prior. Unchanged mediastinal contours.
Mild peribronchial thickening. No confluent airspace disease. No
significant pleural effusion. No pneumothorax. No acute osseous
abnormalities are seen.
IMPRESSION: 1. Stable mild cardiomegaly.
2. Mild peribronchial thickening, can be seen with bronchitis or
asthma.

## 2020-03-10 MED ORDER — POTASSIUM CHLORIDE CRYS ER 20 MEQ PO TBCR
40.0000 meq | EXTENDED_RELEASE_TABLET | Freq: Once | ORAL | Status: AC
  Administered 2020-03-10: 40 meq via ORAL
  Filled 2020-03-10: qty 2

## 2020-03-10 MED ORDER — SODIUM CHLORIDE 0.9 % IV SOLN
INTRAVENOUS | Status: DC | PRN
  Administered 2020-03-10: 250 mL via INTRAVENOUS
  Administered 2020-03-15: 1000 mL via INTRAVENOUS
  Administered 2020-03-19 – 2020-03-20 (×3): 500 mL via INTRAVENOUS

## 2020-03-10 MED ORDER — ALUM & MAG HYDROXIDE-SIMETH 200-200-20 MG/5ML PO SUSP
30.0000 mL | Freq: Four times a day (QID) | ORAL | Status: DC | PRN
  Administered 2020-03-10 – 2020-03-11 (×2): 30 mL via ORAL
  Filled 2020-03-10 (×2): qty 30

## 2020-03-10 NOTE — TOC Initial Note (Signed)
Transition of Care [facility]) - Initial/Assessment Note    Patient Details  Name: [name] [name] MRN: [id number] Date of Birth: 1946-01-17  [facility] CM/SW Contact:    Pollie Friar, RN Phone Number: 03/10/2020, 12:24 PM  Clinical Narrative:                 Pt lives with spouse at home that is able to provide needed transportation and who over sees his meds at home.  Recommendations for outpatient ST. CM met with the patient and he lives in [place]. He asked to attend at [facility] outpatient if still needed at d/c.  TOC following.   Expected Discharge Plan: OP Rehab Barriers to Discharge: Continued Medical Work up   Patient Goals and CMS Choice     Choice offered to / list presented to : Patient  Expected Discharge Plan and Services Expected Discharge Plan: OP Rehab   Discharge Planning Services: CM Consult   Living arrangements for the past 2 months: Single Family Home                                      Prior Living Arrangements/Services Living arrangements for the past 2 months: Single Family Home Lives with:: Spouse Patient language and need for interpreter reviewed:: Yes Do you feel safe going back to the place where you live?: Yes      Need for Family Participation in Patient Care: Yes (Comment) Care giver support system in place?: Yes (comment) (spouse)   Criminal Activity/Legal Involvement Pertinent to Current Situation/Hospitalization: No - Comment as needed  Activities of Daily Living Home Assistive Devices/Equipment: Cane (specify quad or straight) ADL Screening (condition at time of admission) Patient's cognitive ability adequate to safely complete daily activities?: Yes Is the patient deaf or have difficulty hearing?: No Does the patient have difficulty seeing, even when wearing glasses/contacts?: No Does the patient have difficulty concentrating, remembering, or making decisions?: Yes Patient able to express need for  assistance with ADLs?: No Does the patient have difficulty dressing or bathing?: No Independently performs ADLs?: Yes (appropriate for developmental age) Does the patient have difficulty walking or climbing stairs?: No Weakness of Legs: None Weakness of Arms/Hands: None  Permission Sought/Granted                  Emotional Assessment Appearance:: Appears stated age Attitude/Demeanor/Rapport: Engaged Affect (typically observed): Accepting Orientation: : Oriented to Self, Oriented to Situation, Oriented to Place   Psych Involvement: No (comment)  Admission diagnosis:  Hypoglycemia [E16.2] Acute ischemic stroke ([place]) [I63.9] Fall, initial encounter [W19.XXXA] Acute CVA (cerebrovascular accident) ([place]) [I63.9] Stroke [facility]) [I63.9] Patient Active Problem List   Diagnosis Date Noted  . Stroke ([place]) 03/09/2020  . Acute ischemic stroke ([place]) 03/08/2020  . Diabetes mellitus type II, non insulin dependent ([place]) 03/08/2020  . Memory loss 03/08/2020  . Renal insufficiency 03/08/2020  . Hypertension   . Lung nodule, solitary 07/06/2014  . 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 AAA (abdominal aortic aneurysm) without rupture ([place]) 04/07/2013  . Abdominal aneurysm without mention of rupture 03/18/2012   PCP:  [name], MD Pharmacy:   [facility] [place], [address] [place] [id number] Phone: [phone] Fax: [phone]     Social Determinants of Health (SDOH) Interventions  Readmission Risk Interventions No flowsheet data found.

## 2020-03-10 NOTE — Progress Notes (Signed)
[facility]  Subjective  - no new neurologic events overnight.   Objective (!) 144/68 62 98.4 F (36.9 C) (Oral) 14 94%  Intake/Output Summary (Last 24 hours) at 03/10/2020 0912 Last data filed at 03/10/2020 0800 Gross per 24 hour  Intake 460 ml  Output 1750 ml  Net -1290 ml    Neuro intact, CN II-XII grossly intact.  Laboratory Lab Results: Recent Labs    03/09/20 1000 03/10/20 0423  WBC 9.0 8.5  HGB 12.1* 12.4*  HCT 36.7* 37.6*  PLT 155 148*   BMET Recent Labs    03/09/20 1000 03/10/20 0423  NA 138 140  K 3.4* 3.4*  CL 105 107  CO2 23 24  GLUCOSE 203* 106*  BUN 14 12  CREATININE 1.33* 1.20  CALCIUM 9.1 9.3    COAG Lab Results  Component Value Date   INR 0.9 03/08/2020   INR 1.02 04/29/2013   INR 0.90 04/24/2013   No results found for: PTT  Assessment/Planning:  74 year old male that vascular surgery was consulted for a high-grade symptomatic left ICA stenosis.  Patient initially presented with slurred speech on Tuesday and has ultimately undergone stroke work-up and been evaluated by neurology.  MRI shows left hemispheric infarcts in the frontal and occipital lobe.  Neurology feels this is likely related to MCA distribution.  CTA neck does confirm a high-grade very short focal proximal left ICA stenosis greater than 80%. Duplex also confirms.  Plan left CEA tomorrow with me in OR.  NPO after midnight.  Risks and benefits discussed.  Continue aspirin and plavix.  Marty Heck 03/10/2020 9:12 AM --

## 2020-03-10 NOTE — Progress Notes (Signed)
PROGRESS NOTE    [name] [name]  WPY:[id number] DOB: [birth year] DOA: 03/08/2020 PCP: [name], MD   Chief Complaint  Patient presents with  . Hypoglycemia  . Fall    Brief Narrative:  [name] [name] is [name] [name] 74 y.o. male with medical history significant for memory loss, chronic kidney disease, type 2 diabetes mellitus, AAA, hypertension, and hyperlipidemia, now presenting to the emergency department for evaluation of confusion, hypoglycemia, and [name] [name] fall. Patient reports that he was in his usual state of health throughout the day yesterday but last night began to feel drowsy and generally poor but without fevers, chills, headache, chest pain, palpitations, or shortness of breath at that time. He continued to feel sleepy today, did not have any appetite, and then fell in the bathroom, hitting his head but without losing consciousness. His wife noted that he seemed to be having difficulty finding the correct words to use and seemed to be confused. EMS was called. CBG was 52, improving with juice, but the speech difficulty and confusion persisted. Patient reports [name] Schliep headache that he attributes to the fall, but notes that it resolved with acetaminophen. He denies any focal numbness or weakness.  ED Course: Upon arrival to the ED, patient is found to be afebrile, saturating well on room air, and with stable blood pressure. EKG features [name] [name] sinus rhythm with left axis deviation. Noncontrast head CT is negative for acute intracranial abnormality. Cervical spine CT negative for acute fracture or subluxation. CTA head and neck with extensive atheromatous disease involving the aorta and branch vessels, 30% narrowing of the innominate artery, 50% narrowing left carotid bulb, and 80 to 90% narrowing involving the proximal left ICA. MRI brain is concerning for small acute infarctions involving the left frontal and occipital lobes. Chemistry panel notable for glucose 191 and creatinine 1.46. CBC with [name] [name] leukocytosis to  12,700. COVID-19 screening test was negative. Patient was treated with Zofran and acetaminophen in the ED and neurology was consulted by the ED physician.  Assessment & Plan:   Principal Problem:   Acute ischemic stroke [facility]) Active Problems:   Diabetes mellitus type II, non insulin dependent ([place])   Memory loss   Renal insufficiency   Hypertension   Stroke ([place])  1. Acute ischemic CVA  - Presents with speech difficulty, confusion, and Vienne Corcoran fall and is found to have acute ischemic infarctions on MRI involving left frontal and occipital lobes  - CTA head/neck with extensive atheromatous disease of the aorta and branch vessels.  30% luminal narrowing of innominate 2/2 atheromatous plaque.  50% narrowing of L carotid bulb and 80-90% narrowing of proximal L ICA 2/2 atheromaous plaque.  Mild R V1 and V4 segment narrowing 2/2 atheromatous diseae.  Mild narrowing of proximal R petrous and L paraclinoid ICA. - carotid US notable for L ICA velocities consistent with 60-79% stenosis, PSV at bulb suggest 80-99% stenosis.  - Neurology, recommending DAPT, crestor increased to 40 mg, vascular surgery consult -> plan for CEA with vascular surgery on 03/11/20 - echo with EF 55-60%, no RWMA (see report) - Continue cardiac monitoring, frequent neuro checks  - PT/OT/SLP eval -> OT recommending outpatient OT, 24 hr supervision/assistance, 3 in 1 bedside commode.  Outpatient SLP.  No PT follow up.   - LDL 95, a1c 6.8   # Carotid Artery Stenosis: planning for L CEA Friday with Dr. Carlis Abbott  2. Type II DM  - on glimepiride at home, currently on hold - A1c 6.8 - continue SSI  3. Renal insufficiency  - SCr is 1.46 on admission  - creatinine 1.2 today, continue to follow - He has CKD on problem list but most recent prior labs available are from 2014 when SCr was 0.9   - Renally-dose medications, monitor    4. Hypertension  - BP normal in ED, hold antihypertensives for now in setting of acute ischemic CVA.   Will gradually resume in coming days.  Per neurology, will defer to vascular surgery post CEA goals.   5. Hyperlipidemia  - Continue Crestor, check fasting lipids    6. Memory loss  Etoh Abuse:  - Followed by neurology, treated with Aricept and Namenda  - follow thiamine level (pending) - on high dose thiamine x 3 days, then 100 mg daily - follow B12 (low normal, follow MMA) - continue folate supplementation - CIWA  7. OSA  - Patient reports CPAP intolerance   # Cough: x2-3 weeks, follow CXR - follow outpatient  DVT prophylaxis: lovenox Code Status: full  Family Communication: none at bedside Disposition:   Status is: Observation  The patient will require care spanning > 2 midnights and should be moved to inpatient because: Inpatient level of care appropriate due to severity of illness  Dispo: The patient is from: Home              Anticipated d/c is to: Home              Anticipated d/c date is: > 3 days              Patient currently is not medically stable to d/c.       Consultants:   Neurology  Vascular surgery  Procedures:  Echo IMPRESSIONS    1. Left ventricular ejection fraction, by estimation, is 55 to 60%. The  left ventricle has normal function. The left ventricle has no regional  wall motion abnormalities. Left ventricular diastolic parameters were  normal.  2. Right ventricular systolic function is normal. The right ventricular  size is normal.  3. Left atrial size was mildly dilated.  4. The mitral valve is normal in structure. Mild mitral valve  regurgitation. No evidence of mitral stenosis.  5. The aortic valve is tricuspid. Aortic valve regurgitation is mild.  Mild aortic valve sclerosis is present, with no evidence of aortic valve  stenosis.  6. The inferior vena cava is normal in size with greater than 50%  respiratory variability, suggesting right atrial pressure of 3 mmHg.   Carotid US Summary:  Right Carotid: The  extracranial vessels were near-normal with only minimal  wall         thickening or plaque.   Left Carotid: Velocities in the left ICA are consistent with Jawara Latorre 60-79%  stenosis.        PSV at the bulb level suggest 80-99% stenosis.     *See table(s) above for measurements and observations.    Antimicrobials Anti-infectives (From admission, onward)   None        Subjective: [name] [name] No new complaints. Notes cough x2-3 weeks.  Objective: Vitals:   03/09/20 2325 03/10/20 0410 03/10/20 0700 03/10/20 1100  BP: (!) 154/64 (!) 128/56 (!) 144/68 (!) 145/64  Pulse: 60 63 62 62  Resp: 17 17 14 18   Temp: 98.5 F (36.9 C) 98.7 F (37.1 C) 98.4 F (36.9 C) 98.4 F (36.9 C)  TempSrc: Oral Oral Oral Oral  SpO2: 96% 95% 94% 96%  Weight:      Height:  Intake/Output Summary (Last 24 hours) at 03/10/2020 1601 Last data filed at 03/10/2020 1500 Gross per 24 hour  Intake 1382 ml  Output 2500 ml  Net -1118 ml   Filed Weights   03/09/20 0201  Weight: 84.1 kg    Examination:  General: No acute distress. Cardiovascular: Heart sounds show Husayn Reim regular rate, and rhythm. Lungs: Clear to auscultation bilaterally  Abdomen: Soft, nontender, nondistended Neurological: Alert and oriented 3. Moves all extremities 4. Cranial nerves II through XII grossly intact. Skin: Warm and dry. No rashes or lesions. Extremities: No clubbing or cyanosis. No edema.   Data Reviewed: I have personally reviewed following labs and imaging studies  CBC: Recent Labs  Lab 03/08/20 1613 03/09/20 1000 03/10/20 0423  WBC 12.7* 9.0 8.5  NEUTROABS 10.6*  --  5.8  HGB 14.0 12.1* 12.4*  HCT 41.7 36.7* 37.6*  MCV 91.0 90.2 91.9  PLT 187 155 148*    Basic Metabolic Panel: Recent Labs  Lab 03/08/20 1613 03/09/20 0338 03/09/20 1000 03/10/20 0423  NA 136  --  138 140  K 4.2  --  3.4* 3.4*  CL 102  --  105 107  CO2 24  --  23 24  GLUCOSE 191*  --  203* 106*  BUN 20  --  14 12    CREATININE 1.46*  --  1.33* 1.20  CALCIUM 9.7  --  9.1 9.3  MG  --  2.2  --  2.0  PHOS  --   --   --  2.7    GFR: Estimated Creatinine Clearance: 54 mL/min (by C-G formula based on SCr of 1.2 mg/dL).  Liver Function Tests: Recent Labs  Lab 03/08/20 1613 03/09/20 1000 03/10/20 0423  AST 39 34 31  ALT 30 29 33  ALKPHOS 120 101 98  BILITOT 0.7 1.1 1.0  PROT 6.7 5.9* 6.3*  ALBUMIN 3.9 3.3* 3.4*    CBG: Recent Labs  Lab 03/09/20 1312 03/09/20 1647 03/09/20 2112 03/10/20 0626 03/10/20 1227  GLUCAP 118* 151* 119* 116* 161*     Recent Results (from the past 240 hour(s))  SARS Coronavirus 2 by RT PCR (hospital order, performed in [facility] hospital lab) Nasopharyngeal Nasopharyngeal Swab     Status: None   Collection Time: 03/08/20  5:03 PM   Specimen: Nasopharyngeal Swab  Result Value Ref Range Status   SARS Coronavirus 2 NEGATIVE NEGATIVE Final    Comment: (NOTE) SARS-CoV-2 target nucleic acids are NOT DETECTED.  The SARS-CoV-2 RNA is generally detectable in upper and lower respiratory specimens during the acute phase of infection. The lowest concentration of SARS-CoV-2 viral copies this assay can detect is 250 copies / mL. [name] [name] negative result does not preclude SARS-CoV-2 infection and should not be used as the sole basis for treatment or other patient management decisions.  [name] [name] negative result may occur with improper specimen collection / handling, submission of specimen other than nasopharyngeal swab, presence of viral mutation(s) within the areas targeted by this assay, and inadequate number of viral copies (<250 copies / mL). [name] [name] negative result must be combined with clinical observations, patient history, and epidemiological information.  Fact Sheet for Patients:   StrictlyIdeas.no  Fact Sheet for Healthcare Providers: BankingDealers.co.za  This test is not yet approved or  cleared by the [place] FDA  and has been authorized for detection and/or diagnosis of SARS-CoV-2 by FDA under an Emergency Use Authorization (EUA).  This EUA will remain in effect (meaning this test can be used)  for the duration of the COVID-19 declaration under Section 564(b)(1) of the Act, 21 U.S.C. section 360bbb-3(b)(1), unless the authorization is terminated or revoked sooner.  Performed at [facility], [place] [address], [place], [place] [address]   Culture, Urine     Status: None   Collection Time: 03/08/20 10:21 PM   Specimen: Urine, Random  Result Value Ref Range Status   Specimen Description URINE, RANDOM  Final   Special Requests NONE  Final   Culture   Final    NO GROWTH Performed at [facility], [place] [address] [place], [place] [address]    Report Status 03/09/2020 FINAL  Final         Radiology Studies: CT Angio Head W or Wo Contrast  Result Date: 03/08/2020 CLINICAL DATA:  Neuro deficit EXAM: CT ANGIOGRAPHY HEAD AND NECK TECHNIQUE: Multidetector CT imaging of the head and neck was performed using the standard protocol during bolus administration of intravenous contrast. Multiplanar CT image reconstructions and MIPs were obtained to evaluate the vascular anatomy. Carotid stenosis measurements (when applicable) are obtained utilizing NASCET criteria, using the distal internal carotid diameter as the denominator. CONTRAST:  25mL OMNIPAQUE IOHEXOL 350 MG/ML SOLN COMPARISON:  03/08/2020 head CT and prior. FINDINGS: CTA NECK FINDINGS Aortic arch: Standard branching. Imaged portion shows no evidence of aneurysm or dissection. Extensive noncalcified atheromatous plaque involving the aortic arch and great vessel origins. Some of the plaque demonstrates Sina Sumpter pedunculated appearance. Approximately 30% luminal narrowing of the innominate artery secondary to atheromatous disease. No significant stenosis of the left common carotid and left subclavian artery origins. Right carotid system: Noncalcified  atheromatous disease involving the proximal right common carotid artery without significant luminal narrowing. Carotid bifurcation calcified atheromatous plaque without high-grade narrowing. Mild narrowing of the proximal right petrous segment secondary to noncalcified atheromatous plaque. Left carotid system: Patent normal caliber common carotid artery. Carotid bifurcation atherosclerotic calcifications with approximately 80-90% narrowing of the proximal ICA. The carotid bulb is narrowed by approximately 50%. Vertebral arteries: Dominant right vertebral artery. Right V1 and V4 segment calcified atheromatous plaque with mild narrowing. Left vertebral artery atheromatous disease with no greater than mild narrowing. Skeleton: Multilevel spondylosis.  No acute osseous abnormalities. Other neck: No adenopathy.  No soft tissue mass. Upper chest: Emphysema. Dependent atelectasis. Motion artifact limits evaluation. Review of the MIP images confirms the above findings CTA HEAD FINDINGS Anterior circulation: Bilateral carotid siphon atherosclerotic calcifications. Mild narrowing of the left paraclinoid ICA. Patent, normal caliber appearance of the anterior and middle cerebral arteries. Posterior circulation: Dominant right vertebral artery. Normal caliber patent basilar, superior cerebellar and posterior cerebral arteries. Venous sinuses: As permitted by contrast timing, patent. Anatomic variants: None. 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 30% luminal narrowing of the innominate secondary to atheromatous plaque. Approximately 50% narrowing of the left carotid bulb and 80-90% narrowing of the proximal left ICA secondary to atheromatous plaque. Mild right V1 and V4 segment narrowing secondary to atheromatous disease. Mild narrowing of the proximal right petrous and left paraclinoid ICA. Electronically Signed   By: Primitivo Gauze M.D.   On: 03/08/2020 19:00   CT Head Wo Contrast  Result Date: 03/08/2020 CLINICAL DATA:  Head trauma, minor (Age >= 65y) Post fall with vomiting. EXAM: CT HEAD WITHOUT CONTRAST TECHNIQUE: Contiguous axial images were obtained from the base of the skull through the vertex without intravenous contrast. COMPARISON:  Head CT  12/05/2019, brain MRI 12/07/2019 FINDINGS: Brain: No acute hemorrhage or evidence of acute ischemia. Stable degree of atrophy, ventricular dilatation, and periventricular chronic small vessel ischemia. Remote lacunar infarcts in the left greater than right basal ganglia. No midline shift or mass effect. No subdural or extra-axial collection. Vascular: Atherosclerosis of skullbase vasculature without hyperdense vessel or abnormal calcification. Skull: No fracture or focal lesion. Sinuses/Orbits: Minimal chronic mucosal thickening paranasal sinuses. No sinus fluid level or acute findings. The mastoid air cells are clear. Orbits are unremarkable. Other: None. IMPRESSION: 1. No acute intracranial abnormality. No skull fracture. 2. Stable atrophy, chronic small vessel ischemia, and remote basal gangliar lacunar infarcts. Electronically Signed   By: Keith Rake M.D.   On: 03/08/2020 17:00   CT Angio Neck W and/or Wo Contrast  Result Date: 03/08/2020 CLINICAL DATA:  Neuro deficit EXAM: CT ANGIOGRAPHY HEAD AND NECK TECHNIQUE: Multidetector CT imaging of the head and neck was performed using the standard protocol during bolus administration of intravenous contrast. Multiplanar CT image reconstructions and MIPs were obtained to evaluate the vascular anatomy. Carotid stenosis measurements (when applicable) are obtained utilizing NASCET criteria, using the distal internal carotid diameter as the denominator. CONTRAST:  62mL OMNIPAQUE IOHEXOL 350 MG/ML SOLN COMPARISON:  03/08/2020 head CT and prior. FINDINGS: CTA NECK FINDINGS Aortic arch: Standard branching. Imaged portion shows no  evidence of aneurysm or dissection. Extensive noncalcified atheromatous plaque involving the aortic arch and great vessel origins. Some of the plaque demonstrates Chanel Mcadams pedunculated appearance. Approximately 30% luminal narrowing of the innominate artery secondary to atheromatous disease. No significant stenosis of the left common carotid and left subclavian artery origins. Right carotid system: Noncalcified atheromatous disease involving the proximal right common carotid artery without significant luminal narrowing. Carotid bifurcation calcified atheromatous plaque without high-grade narrowing. Mild narrowing of the proximal right petrous segment secondary to noncalcified atheromatous plaque. Left carotid system: Patent normal caliber common carotid artery. Carotid bifurcation atherosclerotic calcifications with approximately 80-90% narrowing of the proximal ICA. The carotid bulb is narrowed by approximately 50%. Vertebral arteries: Dominant right vertebral artery. Right V1 and V4 segment calcified atheromatous plaque with mild narrowing. Left vertebral artery atheromatous disease with no greater than mild narrowing. Skeleton: Multilevel spondylosis.  No acute osseous abnormalities. Other neck: No adenopathy.  No soft tissue mass. Upper chest: Emphysema. Dependent atelectasis. Motion artifact limits evaluation. Review of the MIP images confirms the above findings CTA HEAD FINDINGS Anterior circulation: Bilateral carotid siphon atherosclerotic calcifications. Mild narrowing of the left paraclinoid ICA. Patent, normal caliber appearance of the anterior and middle cerebral arteries. Posterior circulation: Dominant right vertebral artery. Normal caliber patent basilar, superior cerebellar and posterior cerebral arteries. Venous sinuses: As permitted by contrast timing, patent. Anatomic variants: None. Review of the MIP images confirms the above findings IMPRESSION: Extensive atheromatous disease involving the aorta and its  branch vessels. 30% luminal narrowing of the innominate secondary to atheromatous plaque. Approximately 50% narrowing of the left carotid bulb and 80-90% narrowing of the proximal left ICA secondary to atheromatous plaque. Mild right V1 and V4 segment narrowing secondary to atheromatous disease. Mild narrowing of the proximal right petrous and left paraclinoid ICA. Electronically Signed   By: Primitivo Gauze M.D.   On: 03/08/2020 19:00   CT Cervical Spine Wo Contrast  Result Date: 03/08/2020 CLINICAL DATA:  Neck trauma (Age >= 65y) Post fall with vomiting. EXAM: CT CERVICAL SPINE WITHOUT CONTRAST TECHNIQUE: Multidetector CT imaging of the cervical spine was performed without intravenous contrast. Multiplanar CT image reconstructions were  also generated. COMPARISON:  None. FINDINGS: Alignment: Normal. Skull base and vertebrae: No acute fracture. Vertebral body heights are maintained. The dens and skull base are intact. Soft tissues and spinal canal: No prevertebral fluid or swelling. No visible canal hematoma. Disc levels: Disc space narrowing and endplate spurring at A3-F5. There is multilevel endplate spurring at additional levels with preservation of disc spaces. Facet hypertrophy at C4-C5 on the left and multilevel on the right. Upper chest: Breathing motion artifact. No evidence of acute abnormality. Other: Carotid calcifications. IMPRESSION: Degenerative change in the cervical spine without acute fracture or subluxation. Electronically Signed   By: Keith Rake M.D.   On: 03/08/2020 17:04   MR BRAIN WO CONTRAST  Result Date: 03/08/2020 CLINICAL DATA:  Neuro deficit EXAM: MRI HEAD WITHOUT CONTRAST TECHNIQUE: Multiplanar, multiecho pulse sequences of the brain and surrounding structures were obtained without intravenous contrast. COMPARISON:  Concurrent CTA head and neck. 12/07/2019 MRI head and prior. FINDINGS: Brain: Moderate cerebral atrophy with ex vacuo dilatation. Scattered and confluent  T2/FLAIR hyperintense foci involving the periventricular and subcortical white matter are nonspecific however commonly associated with chronic microvascular ischemic changes. Small foci of restricted diffusion involving the left frontal lobe at the vertex and left occipital lobe (2:20, 44). No intracranial hemorrhage. No midline shift, mass lesion or extra-axial fluid collection. Sequela of remote bilateral basal ganglia, thalamic and cerebellar insults. Vascular: Please see concurrent CTA Skull and upper cervical spine: Normal marrow signal. Sinuses/Orbits: Normal orbits. Mild pansinus disease. No mastoid effusion. Other: None. IMPRESSION: Small acute infarcts involving the left frontal and occipital lobes. Moderate cerebral atrophy and chronic microvascular ischemic changes. Remote bilateral basal ganglia, thalamic and cerebellar insults. Mild pansinus disease. These results were called by telephone at the time of interpretation on 03/08/2020 at 7:13 pm to provider ADAM CURATOLO , who verbally acknowledged these results. Electronically Signed   By: Primitivo Gauze M.D.   On: 03/08/2020 19:15   MR CERVICAL SPINE WO CONTRAST  Result Date: 03/08/2020 CLINICAL DATA:  Initial evaluation for acute myelopathy. EXAM: MRI CERVICAL AND THORACIC SPINE WITHOUT CONTRAST TECHNIQUE: Multiplanar and multiecho pulse sequences of the cervical spine, to include the craniocervical junction and cervicothoracic junction, and the thoracic spine, were obtained without intravenous contrast. COMPARISON:  None available. FINDINGS: MRI CERVICAL SPINE FINDINGS Examination degraded by motion artifact. Alignment: Physiologic with preservation of the normal cervical lordosis. No listhesis. Vertebrae: Vertebral body height maintained without acute or chronic fracture. Bone marrow signal intensity within normal limits. No worrisome osseous lesions. Minimal reactive marrow edema seen about the left C6-7 facet due to facet arthritis. No other  abnormal marrow edema. Cord: Normal signal and morphology. Posterior Fossa, vertebral arteries, paraspinal tissues: Visualized brain and posterior fossa within normal limits. Craniocervical junction normal. Paraspinous and prevertebral soft tissues within normal limits. Normal intravascular flow voids seen within the vertebral arteries bilaterally. Disc levels: C2-C3: Negative interspace. Right greater than left facet hypertrophy with associated ankylosis on the right. No spinal stenosis. Foramina remain patent. C3-C4: Minimal disc bulge with uncovertebral hypertrophy. Moderate right greater than left facet and ligament flavum hypertrophy. No significant spinal stenosis. Moderate bilateral C4 foraminal narrowing. C4-C5: Mild disc bulge with uncovertebral hypertrophy. Superimposed small central/right paracentral disc protrusion mildly indents the ventral thecal sac (series 5, image 20). Moderate right with mild left facet hypertrophy. No significant spinal stenosis. Moderate left with mild to moderate right C5 foraminal stenosis. C5-C6: Degenerative intervertebral disc space narrowing with diffuse disc bulge and bilateral uncovertebral hypertrophy. Moderate left  with mild right facet degeneration with associated ankylosis on the left. No significant spinal stenosis. Moderate left with mild right C6 foraminal narrowing. C6-C7: Mild annular disc bulge. Mild to moderate bilateral facet hypertrophy. No significant spinal stenosis. Foramina remain patent. C7-T1: Minimal disc bulge. Mild facet hypertrophy. No canal or foraminal stenosis. MRI THORACIC SPINE FINDINGS Examination degraded by motion artifact. Alignment: Mild scoliosis with exaggeration of the normal thoracic kyphosis. No listhesis. Vertebrae: Vertebral body height maintained without acute or chronic fracture. Bone marrow signal intensity within normal limits. Few scattered benign hemangiomata noted. No worrisome osseous lesions. No abnormal marrow edema. Cord:  Normal signal and morphology. No convincing cord signal abnormality on this motion degraded exam. Conus medullaris terminates at approximately the L1 level. Paraspinal and other soft tissues: Negative. Disc levels: No significant degenerative disc disease seen for patient age. No disc bulge or focal disc herniation. No significant canal or foraminal stenosis. No neural impingement. IMPRESSION: 1. Normal MRI of the cervicothoracic spinal cord. No cord signal changes to suggest myelopathy or other abnormality. 2. Mild multilevel cervical spondylosis and facet arthrosis without significant spinal stenosis. Associated mild to moderate bilateral C4 through C6 foraminal narrowing as above. 3. No significant disc pathology within the thoracic spine for patient age. No thoracic spinal stenosis or neural impingement. Electronically Signed   By: Jeannine Boga M.D.   On: 03/08/2020 23:30   MR THORACIC SPINE WO CONTRAST  Result Date: 03/08/2020 CLINICAL DATA:  Initial evaluation for acute myelopathy. EXAM: MRI CERVICAL AND THORACIC SPINE WITHOUT CONTRAST TECHNIQUE: Multiplanar and multiecho pulse sequences of the cervical spine, to include the craniocervical junction and cervicothoracic junction, and the thoracic spine, were obtained without intravenous contrast. COMPARISON:  None available. FINDINGS: MRI CERVICAL SPINE FINDINGS Examination degraded by motion artifact. Alignment: Physiologic with preservation of the normal cervical lordosis. No listhesis. Vertebrae: Vertebral body height maintained without acute or chronic fracture. Bone marrow signal intensity within normal limits. No worrisome osseous lesions. Minimal reactive marrow edema seen about the left C6-7 facet due to facet arthritis. No other abnormal marrow edema. Cord: Normal signal and morphology. Posterior Fossa, vertebral arteries, paraspinal tissues: Visualized brain and posterior fossa within normal limits. Craniocervical junction normal.  Paraspinous and prevertebral soft tissues within normal limits. Normal intravascular flow voids seen within the vertebral arteries bilaterally. Disc levels: C2-C3: Negative interspace. Right greater than left facet hypertrophy with associated ankylosis on the right. No spinal stenosis. Foramina remain patent. C3-C4: Minimal disc bulge with uncovertebral hypertrophy. Moderate right greater than left facet and ligament flavum hypertrophy. No significant spinal stenosis. Moderate bilateral C4 foraminal narrowing. C4-C5: Mild disc bulge with uncovertebral hypertrophy. Superimposed small central/right paracentral disc protrusion mildly indents the ventral thecal sac (series 5, image 20). Moderate right with mild left facet hypertrophy. No significant spinal stenosis. Moderate left with mild to moderate right C5 foraminal stenosis. C5-C6: Degenerative intervertebral disc space narrowing with diffuse disc bulge and bilateral uncovertebral hypertrophy. Moderate left with mild right facet degeneration with associated ankylosis on the left. No significant spinal stenosis. Moderate left with mild right C6 foraminal narrowing. C6-C7: Mild annular disc bulge. Mild to moderate bilateral facet hypertrophy. No significant spinal stenosis. Foramina remain patent. C7-T1: Minimal disc bulge. Mild facet hypertrophy. No canal or foraminal stenosis. MRI THORACIC SPINE FINDINGS Examination degraded by motion artifact. Alignment: Mild scoliosis with exaggeration of the normal thoracic kyphosis. No listhesis. Vertebrae: Vertebral body height maintained without acute or chronic fracture. Bone marrow signal intensity within normal limits. Few scattered benign  hemangiomata noted. No worrisome osseous lesions. No abnormal marrow edema. Cord: Normal signal and morphology. No convincing cord signal abnormality on this motion degraded exam. Conus medullaris terminates at approximately the L1 level. Paraspinal and other soft tissues: Negative. Disc  levels: No significant degenerative disc disease seen for patient age. No disc bulge or focal disc herniation. No significant canal or foraminal stenosis. No neural impingement. IMPRESSION: 1. Normal MRI of the cervicothoracic spinal cord. No cord signal changes to suggest myelopathy or other abnormality. 2. Mild multilevel cervical spondylosis and facet arthrosis without significant spinal stenosis. Associated mild to moderate bilateral C4 through C6 foraminal narrowing as above. 3. No significant disc pathology within the thoracic spine for patient age. No thoracic spinal stenosis or neural impingement. Electronically Signed   By: Jeannine Boga M.D.   On: 03/08/2020 23:30   ECHOCARDIOGRAM COMPLETE  Result Date: 03/09/2020    ECHOCARDIOGRAM REPORT   Patient Name:   LILIANA BRENTLINGER Date of Exam: 03/09/2020 Medical Rec #:  [id number]     Height:       69.0 in Accession #:    [id number]    Weight:       185.4 lb Date of Birth:  10-Sep-1945     BSA:          2.001 m Patient Age:    37 years      BP:           132/75 mmHg Patient Gender: M             HR:           71 bpm. Exam Location:  Inpatient Procedure: 2D Echo, Cardiac Doppler and Color Doppler Indications:    Stroke 434.91 / I163.9  History:        Patient has no prior history of Echocardiogram examinations.                 Risk Factors:Hypertension, Dyslipidemia and Diabetes. GERD. CKD.                 Abdominal Aortic Aneurysm.  Sonographer:    Jonelle Sidle Dance Referring Phys: [id number] [place]  1. Left ventricular ejection fraction, by estimation, is 55 to 60%. The left ventricle has normal function. The left ventricle has no regional wall motion abnormalities. Left ventricular diastolic parameters were normal.  2. Right ventricular systolic function is normal. The right ventricular size is normal.  3. Left atrial size was mildly dilated.  4. The mitral valve is normal in structure. Mild mitral valve regurgitation. No evidence of mitral  stenosis.  5. The aortic valve is tricuspid. Aortic valve regurgitation is mild. Mild aortic valve sclerosis is present, with no evidence of aortic valve stenosis.  6. The inferior vena cava is normal in size with greater than 50% respiratory variability, suggesting right atrial pressure of 3 mmHg. FINDINGS  Left Ventricle: Left ventricular ejection fraction, by estimation, is 55 to 60%. The left ventricle has normal function. The left ventricle has no regional wall motion abnormalities. The left ventricular internal cavity size was normal in size. There is  no left ventricular hypertrophy. Left ventricular diastolic parameters were normal. Right Ventricle: The right ventricular size is normal. No increase in right ventricular wall thickness. Right ventricular systolic function is normal. Left Atrium: Left atrial size was mildly dilated. Right Atrium: Right atrial size was normal in size. Pericardium: There is no evidence of pericardial effusion. Mitral Valve: The mitral valve is normal in structure.  Mild mitral valve regurgitation. No evidence of mitral valve stenosis. Tricuspid Valve: The tricuspid valve is normal in structure. Tricuspid valve regurgitation is not demonstrated. No evidence of tricuspid stenosis. Aortic Valve: The aortic valve is tricuspid. Aortic valve regurgitation is mild. Aortic regurgitation PHT measures 390 msec. Mild aortic valve sclerosis is present, with no evidence of aortic valve stenosis. Pulmonic Valve: The pulmonic valve was normal in structure. Pulmonic valve regurgitation is not visualized. No evidence of pulmonic stenosis. Aorta: The aortic root is normal in size and structure. Venous: The inferior vena cava is normal in size with greater than 50% respiratory variability, suggesting right atrial pressure of 3 mmHg. IAS/Shunts: No atrial level shunt detected by color flow Doppler.  LEFT VENTRICLE PLAX 2D LVIDd:         5.30 cm LVIDs:         3.80 cm LV PW:         1.10 cm LV IVS:         1.10 cm LVOT diam:     2.20 cm LV SV:         90 LV SV Index:   45 LVOT Area:     3.80 cm  RIGHT VENTRICLE          IVC RV Basal diam:  1.90 cm  IVC diam: 2.00 cm TAPSE (M-mode): 1.7 cm LEFT ATRIUM             Index       RIGHT ATRIUM          Index LA diam:        4.20 cm 2.10 cm/m  RA Area:     9.05 cm LA Vol (A2C):   85.8 ml 42.89 ml/m RA Volume:   13.90 ml 6.95 ml/m LA Vol (A4C):   66.5 ml 33.24 ml/m LA Biplane Vol: 76.3 ml 38.14 ml/m  AORTIC VALVE LVOT Vmax:   111.50 cm/s LVOT Vmean:  73.400 cm/s LVOT VTI:    0.236 m AI PHT:      390 msec  AORTA Ao Root diam: 3.40 cm Ao Asc diam:  3.50 cm MITRAL VALVE MV Area (PHT): 2.95 cm    SHUNTS MV Decel Time: 257 msec    Systemic VTI:  0.24 m MV E velocity: 90.40 cm/s  Systemic Diam: 2.20 cm MV Kejon Feild velocity: 80.60 cm/s MV E/Marit Goodwill ratio:  1.12 Jenkins Rouge MD Electronically signed by Jenkins Rouge MD Signature Date/Time: 03/09/2020/11:14:58 AM    Final    VAS US CAROTID  Result Date: 03/09/2020 Carotid Arterial Duplex Study Indications:       CVA and Aphasia. History of carotid stenosis. Risk Factors:      Hypertension, hyperlipidemia. Other Factors:     Abdominal Aortic Endovascular Stent Graft in 04/2013. Comparison Study:  CTA of the neck on 03/08/2020 showed right ICA withoug                    significant stenosis. Left proximal ICA 80-90% stenosis with                    approximately 50% narrowing at the bulb.                    Carotid US on 05/31/2016 Performing Technologist: Velva Harman Sturdivant RDMS, RVT  Examination Guidelines: Athelene Hursey complete evaluation includes B-mode imaging, spectral Doppler, color Doppler, and power Doppler as needed of all accessible portions of each vessel. Bilateral testing is considered  an integral part of Randilyn Foisy complete examination. Limited examinations for reoccurring indications may be performed as noted.  Right Carotid Findings: +----------+--------+--------+--------+------------------+--------+           PSV cm/sEDV cm/sStenosisPlaque  DescriptionComments +----------+--------+--------+--------+------------------+--------+ CCA Prox  81      17                                         +----------+--------+--------+--------+------------------+--------+ CCA Distal75      17                                         +----------+--------+--------+--------+------------------+--------+ ICA Prox  81      19                                         +----------+--------+--------+--------+------------------+--------+ ICA Distal81      20                                         +----------+--------+--------+--------+------------------+--------+ ECA       136     12                                         +----------+--------+--------+--------+------------------+--------+ +----------+--------+-------+----------------+-------------------+           PSV cm/sEDV cmsDescribe        Arm Pressure (mmHG) +----------+--------+-------+----------------+-------------------+ Subclavian206            Multiphasic, WNL                    +----------+--------+-------+----------------+-------------------+ +---------+--------+--+--------+--+---------+ VertebralPSV cm/s64EDV cm/s19Antegrade +---------+--------+--+--------+--+---------+  Left Carotid Findings: +----------+--------+--------+--------+------------------+--------+           PSV cm/sEDV cm/sStenosisPlaque DescriptionComments +----------+--------+--------+--------+------------------+--------+ CCA Prox  121     21                                         +----------+--------+--------+--------+------------------+--------+ CCA Distal75      18                                         +----------+--------+--------+--------+------------------+--------+ ICA Prox  422     76      60-79%  homogeneous                +----------+--------+--------+--------+------------------+--------+ ICA Mid   91      30                                          +----------+--------+--------+--------+------------------+--------+ ICA Distal62      23                                         +----------+--------+--------+--------+------------------+--------+  ECA       243     27                                         +----------+--------+--------+--------+------------------+--------+ +----------+--------+--------+----------------+-------------------+           PSV cm/sEDV cm/sDescribe        Arm Pressure (mmHG) +----------+--------+--------+----------------+-------------------+ Subclavian170             Multiphasic, WNL                    +----------+--------+--------+----------------+-------------------+ +---------+--------+--+--------+--+---------+ VertebralPSV cm/s62EDV cm/s14Antegrade +---------+--------+--+--------+--+---------+ On 2D imaging the narrowing appears to be at the bulb/origin level with PSV suggest 80-99% stenosis. However, EDV suggest less - 60-79% stenosis.  Summary: Right Carotid: The extracranial vessels were near-normal with only minimal wall                thickening or plaque. Left Carotid: Velocities in the left ICA are consistent with Garrison Michie 60-79% stenosis.               PSV at the bulb level suggest 80-99% stenosis.  *See table(s) above for measurements and observations.  Electronically signed by Monica Martinez MD on 03/09/2020 at 4:45:36 PM.    Final         Scheduled Meds: . aspirin  81 mg Oral Daily  . Chlorhexidine Gluconate Cloth  6 each Topical Daily  . citalopram  20 mg Oral Daily  . clopidogrel  75 mg Oral Daily  . donepezil  10 mg Oral QHS  . enoxaparin (LOVENOX) injection  40 mg Subcutaneous QHS  . folic acid  1 mg Oral Daily  . insulin aspart  0-5 Units Subcutaneous QHS  . insulin aspart  0-9 Units Subcutaneous TID WC  . LORazepam  0-4 mg Intravenous Q6H   Followed by  . LORazepam  0-4 mg Intravenous Q12H  . memantine  10 mg Oral BID  . multivitamin with minerals  1 tablet Oral Daily  .  rosuvastatin  40 mg Oral q1800  . [START ON 03/12/2020] thiamine injection  100 mg Intravenous Daily  . vitamin B-12  1,000 mcg Oral Daily   Continuous Infusions: . thiamine injection 400 mg (03/10/20 1429)     LOS: 1 day    Time spent: over 30 min    [name] Helper, MD [facility]   To contact the attending provider between 7A-7P or the covering provider during after hours 7P-7A, please log into the web site [email or web address] and access using universal [place] password for that web site. If you do not have the password, please call the hospital operator.  03/10/2020, 4:01 PM   PROGRESS NOTE    [name] [name]  XLK:[id number] DOB: [birth year] DOA: 03/08/2020 PCP: [name], MD   Chief Complaint  Patient presents with  . Hypoglycemia  . Fall    Brief Narrative:  [name] [name] is [name] [name] 74 y.o. male with medical history significant for memory loss, chronic kidney disease, type 2 diabetes mellitus, AAA, hypertension, and hyperlipidemia, now presenting to the emergency department for evaluation of confusion, hypoglycemia, and Elain Wixon fall. Patient reports that he was in his usual state of health throughout the day yesterday but last night began to feel drowsy and generally poor but without fevers, chills, headache, chest pain, palpitations, or shortness of breath at that time. He continued to feel sleepy today,  did not have any appetite, and then fell in the bathroom, hitting his head but without losing consciousness. His wife noted that he seemed to be having difficulty finding the correct words to use and seemed to be confused. EMS was called. CBG was 52, improving with juice, but the speech difficulty and confusion persisted. Patient reports Bridget Westbrooks headache that he attributes to the fall, but notes that it resolved with acetaminophen. He denies any focal numbness or weakness.  ED Course: Upon arrival to the ED, patient is found to be afebrile, saturating well on room air, and with stable  blood pressure. EKG features Shellie Rogoff sinus rhythm with left axis deviation. Noncontrast head CT is negative for acute intracranial abnormality. Cervical spine CT negative for acute fracture or subluxation. CTA head and neck with extensive atheromatous disease involving the aorta and branch vessels, 30% narrowing of the innominate artery, 50% narrowing left carotid bulb, and 80 to 90% narrowing involving the proximal left ICA. MRI brain is concerning for small acute infarctions involving the left frontal and occipital lobes. Chemistry panel notable for glucose 191 and creatinine 1.46. CBC with Kandas Oliveto leukocytosis to 12,700. COVID-19 screening test was negative. Patient was treated with Zofran and acetaminophen in the ED and neurology was consulted by the ED physician.  Assessment & Plan:   Principal Problem:   Acute ischemic stroke [facility]) Active Problems:   Diabetes mellitus type II, non insulin dependent ([place])   Memory loss   Renal insufficiency   Hypertension   Stroke ([place])  1. Acute ischemic CVA  - Presents with speech difficulty, confusion, and Talayla Doyel fall and is found to have acute ischemic infarctions on MRI involving left frontal and occipital lobes  - CTA head/neck with extensive atheromatous disease of the aorta and branch vessels.  30% luminal narrowing of innominate 2/2 atheromatous plaque.  50% narrowing of L carotid bulb and 80-90% narrowing of proximal L ICA 2/2 atheromaous plaque.  Mild R V1 and V4 segment narrowing 2/2 atheromatous diseae.  Mild narrowing of proximal R petrous and L paraclinoid ICA. - carotid US notable for L ICA velocities consistent with 60-79% stenosis, PSV at bulb suggest 80-99% stenosis.  - Neurology, recommending DAPT, crestor, vascular surgery consult - echo with EF 55-60%, no RWMA (see report) - Continue cardiac monitoring, frequent neuro checks  - PT/OT/SLP eval - LDL 95, a1c 6.8   # Carotid Artery Stenosis: planning for L CEA Friday with Dr. Carlis Abbott  2. Type II DM  -  on glimepiride at home, currently on hold - A1c 6.8 - continue SSI   3. Renal insufficiency  - SCr is 1.46 on admission  - He has CKD on problem list but most recent prior labs available are from 2014 when SCr was 0.9   - Renally-dose medications, monitor    4. Hypertension  - BP normal in ED, hold antihypertensives for now in setting of acute ischemic CVA    5. Hyperlipidemia  - Continue Crestor, check fasting lipids    6. Memory loss  Etoh Abuse:  - Followed by neurology, treated with Aricept and Namenda  - follow thiamine level - on high dose thiamine x 3 days, then 100 mg daily - follow B12 (low normal, follow MMA) - continue folate supplementation - CIWA  7. OSA  - Patient reports CPAP intolerance   DVT prophylaxis: lovenox Code Status: full  Family Communication: none at bedside Disposition:   Status is: Observation  The patient will require care spanning > 2 midnights  and should be moved to inpatient because: Inpatient level of care appropriate due to severity of illness  Dispo: The patient is from: Home              Anticipated d/c is to: Home              Anticipated d/c date is: > 3 days              Patient currently is not medically stable to d/c.       Consultants:   Neurology  Vascular surgery  Procedures:  Echo IMPRESSIONS    1. Left ventricular ejection fraction, by estimation, is 55 to 60%. The  left ventricle has normal function. The left ventricle has no regional  wall motion abnormalities. Left ventricular diastolic parameters were  normal.  2. Right ventricular systolic function is normal. The right ventricular  size is normal.  3. Left atrial size was mildly dilated.  4. The mitral valve is normal in structure. Mild mitral valve  regurgitation. No evidence of mitral stenosis.  5. The aortic valve is tricuspid. Aortic valve regurgitation is mild.  Mild aortic valve sclerosis is present, with no evidence of aortic valve    stenosis.  6. The inferior vena cava is normal in size with greater than 50%  respiratory variability, suggesting right atrial pressure of 3 mmHg.   Carotid US Summary:  Right Carotid: The extracranial vessels were near-normal with only minimal  wall         thickening or plaque.   Left Carotid: Velocities in the left ICA are consistent with Wylee Ogden 60-79%  stenosis.        PSV at the bulb level suggest 80-99% stenosis.     *See table(s) above for measurements and observations.    Antimicrobials Anti-infectives (From admission, onward)   None        Subjective: [name] [name] Asking when he'll be able to go home  Objective: Vitals:   03/09/20 2325 03/10/20 0410 03/10/20 0700 03/10/20 1100  BP: (!) 154/64 (!) 128/56 (!) 144/68 (!) 145/64  Pulse: 60 63 62 62  Resp: 17 17 14 18   Temp: 98.5 F (36.9 C) 98.7 F (37.1 C) 98.4 F (36.9 C) 98.4 F (36.9 C)  TempSrc: Oral Oral Oral Oral  SpO2: 96% 95% 94% 96%  Weight:      Height:        Intake/Output Summary (Last 24 hours) at 03/10/2020 1601 Last data filed at 03/10/2020 1500 Gross per 24 hour  Intake 1382 ml  Output 2500 ml  Net -1118 ml   Filed Weights   03/09/20 0201  Weight: 84.1 kg    Examination:  General exam: Appears calm and comfortable  Respiratory system: Clear to auscultation. Respiratory effort normal. Cardiovascular system: S1 & S2 heard, RRR. Gastrointestinal system: Abdomen is nondistended, soft and nontender.  Central nervous system: Alert and oriented x3.  5/5 strength upper and lower. \ Extremities: no LEE Skin: No rashes, lesions or ulcers Psychiatry: Judgement and insight appear normal. Mood & affect appropriate.     Data Reviewed: I have personally reviewed following labs and imaging studies  CBC: Recent Labs  Lab 03/08/20 1613 03/09/20 1000 03/10/20 0423  WBC 12.7* 9.0 8.5  NEUTROABS 10.6*  --  5.8  HGB 14.0 12.1* 12.4*  HCT 41.7 36.7* 37.6*  MCV 91.0 90.2 91.9  PLT  187 155 148*    Basic Metabolic Panel: Recent Labs  Lab 03/08/20 1613 03/09/20 0338 03/09/20 1000  03/10/20 0423  NA 136  --  138 140  K 4.2  --  3.4* 3.4*  CL 102  --  105 107  CO2 24  --  23 24  GLUCOSE 191*  --  203* 106*  BUN 20  --  14 12  CREATININE 1.46*  --  1.33* 1.20  CALCIUM 9.7  --  9.1 9.3  MG  --  2.2  --  2.0  PHOS  --   --   --  2.7    GFR: Estimated Creatinine Clearance: 54 mL/min (by C-G formula based on SCr of 1.2 mg/dL).  Liver Function Tests: Recent Labs  Lab 03/08/20 1613 03/09/20 1000 03/10/20 0423  AST 39 34 31  ALT 30 29 33  ALKPHOS 120 101 98  BILITOT 0.7 1.1 1.0  PROT 6.7 5.9* 6.3*  ALBUMIN 3.9 3.3* 3.4*    CBG: Recent Labs  Lab 03/09/20 1312 03/09/20 1647 03/09/20 2112 03/10/20 0626 03/10/20 1227  GLUCAP 118* 151* 119* 116* 161*     Recent Results (from the past 240 hour(s))  SARS Coronavirus 2 by RT PCR (hospital order, performed in [facility] hospital lab) Nasopharyngeal Nasopharyngeal Swab     Status: None   Collection Time: 03/08/20  5:03 PM   Specimen: Nasopharyngeal Swab  Result Value Ref Range Status   SARS Coronavirus 2 NEGATIVE NEGATIVE Final    Comment: (NOTE) SARS-CoV-2 target nucleic acids are NOT DETECTED.  The SARS-CoV-2 RNA is generally detectable in upper and lower respiratory specimens during the acute phase of infection. The lowest concentration of SARS-CoV-2 viral copies this assay can detect is 250 copies / mL. Meade Hogeland negative result does not preclude SARS-CoV-2 infection and should not be used as the sole basis for treatment or other patient management decisions.  Shemica Meath negative result may occur with improper specimen collection / handling, submission of specimen other than nasopharyngeal swab, presence of viral mutation(s) within the areas targeted by this assay, and inadequate number of viral copies (<250 copies / mL). Delara Shepheard negative result must be combined with clinical observations, patient history, and  epidemiological information.  Fact Sheet for Patients:   StrictlyIdeas.no  Fact Sheet for Healthcare Providers: BankingDealers.co.za  This test is not yet approved or  cleared by the [place] FDA and has been authorized for detection and/or diagnosis of SARS-CoV-2 by FDA under an Emergency Use Authorization (EUA).  This EUA will remain in effect (meaning this test can be used) for the duration of the COVID-19 declaration under Section 564(b)(1) of the Act, 21 U.S.C. section 360bbb-3(b)(1), unless the authorization is terminated or revoked sooner.  Performed at [facility], [place] [address], [place], [place] [address]   Culture, Urine     Status: None   Collection Time: 03/08/20 10:21 PM   Specimen: Urine, Random  Result Value Ref Range Status   Specimen Description URINE, RANDOM  Final   Special Requests NONE  Final   Culture   Final    NO GROWTH Performed at [facility], [place] [address], [place], [place] [address]    Report Status 03/09/2020 FINAL  Final         Radiology Studies: CT Angio Head W or Wo Contrast  Result Date: 03/08/2020 CLINICAL DATA:  Neuro deficit EXAM: CT ANGIOGRAPHY HEAD AND NECK TECHNIQUE: Multidetector CT imaging of the head and neck was performed using the standard protocol during bolus administration of intravenous contrast. Multiplanar CT image reconstructions and MIPs were obtained  to evaluate the vascular anatomy. Carotid stenosis measurements (when applicable) are obtained utilizing NASCET criteria, using the distal internal carotid diameter as the denominator. CONTRAST:  17mL OMNIPAQUE IOHEXOL 350 MG/ML SOLN COMPARISON:  03/08/2020 head CT and prior. FINDINGS: CTA NECK FINDINGS Aortic arch: Standard branching. Imaged portion shows no evidence of aneurysm or dissection. Extensive noncalcified atheromatous plaque involving the aortic arch and great vessel origins. Some of the plaque  demonstrates Lurie Mullane pedunculated appearance. Approximately 30% luminal narrowing of the innominate artery secondary to atheromatous disease. No significant stenosis of the left common carotid and left subclavian artery origins. Right carotid system: Noncalcified atheromatous disease involving the proximal right common carotid artery without significant luminal narrowing. Carotid bifurcation calcified atheromatous plaque without high-grade narrowing. Mild narrowing of the proximal right petrous segment secondary to noncalcified atheromatous plaque. Left carotid system: Patent normal caliber common carotid artery. Carotid bifurcation atherosclerotic calcifications with approximately 80-90% narrowing of the proximal ICA. The carotid bulb is narrowed by approximately 50%. Vertebral arteries: Dominant right vertebral artery. Right V1 and V4 segment calcified atheromatous plaque with mild narrowing. Left vertebral artery atheromatous disease with no greater than mild narrowing. Skeleton: Multilevel spondylosis.  No acute osseous abnormalities. Other neck: No adenopathy.  No soft tissue mass. Upper chest: Emphysema. Dependent atelectasis. Motion artifact limits evaluation. Review of the MIP images confirms the above findings CTA HEAD FINDINGS Anterior circulation: Bilateral carotid siphon atherosclerotic calcifications. Mild narrowing of the left paraclinoid ICA. Patent, normal caliber appearance of the anterior and middle cerebral arteries. Posterior circulation: Dominant right vertebral artery. Normal caliber patent basilar, superior cerebellar and posterior cerebral arteries. Venous sinuses: As permitted by contrast timing, patent. Anatomic variants: None. 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 30% luminal narrowing of the innominate secondary to atheromatous plaque. Approximately 50% narrowing of the left carotid bulb and  80-90% narrowing of the proximal left ICA secondary to atheromatous plaque. Mild right V1 and V4 segment narrowing secondary to atheromatous disease. Mild narrowing of the proximal right petrous and left paraclinoid ICA. Electronically Signed   By: Primitivo Gauze M.D.   On: 03/08/2020 19:00   CT Head Wo Contrast  Result Date: 03/08/2020 CLINICAL DATA:  Head trauma, minor (Age >= 65y) Post fall with vomiting. EXAM: CT HEAD WITHOUT CONTRAST TECHNIQUE: Contiguous axial images were obtained from the base of the skull through the vertex without intravenous contrast. COMPARISON:  Head CT 12/05/2019, brain MRI 12/07/2019 FINDINGS: Brain: No acute hemorrhage or evidence of acute ischemia. Stable degree of atrophy, ventricular dilatation, and periventricular chronic small vessel ischemia. Remote lacunar infarcts in the left greater than right basal ganglia. No midline shift or mass effect. No subdural or extra-axial collection. Vascular: Atherosclerosis of skullbase vasculature without hyperdense vessel or abnormal calcification. Skull: No fracture or focal lesion. Sinuses/Orbits: Minimal chronic mucosal thickening paranasal sinuses. No sinus fluid level or acute findings. The mastoid air cells are clear. Orbits are unremarkable. Other: None. IMPRESSION: 1. No acute intracranial abnormality. No skull fracture. 2. Stable atrophy, chronic small vessel ischemia, and remote basal gangliar lacunar infarcts. Electronically Signed   By: Keith Rake M.D.   On: 03/08/2020 17:00   CT Angio Neck W and/or Wo Contrast  Result Date: 03/08/2020 CLINICAL DATA:  Neuro deficit EXAM: CT ANGIOGRAPHY HEAD AND NECK TECHNIQUE: Multidetector CT imaging of the head and neck was performed using the standard protocol during bolus administration of intravenous contrast. Multiplanar CT image reconstructions and  MIPs were obtained to evaluate the vascular anatomy. Carotid stenosis measurements (when applicable) are obtained utilizing  NASCET criteria, using the distal internal carotid diameter as the denominator. CONTRAST:  46mL OMNIPAQUE IOHEXOL 350 MG/ML SOLN COMPARISON:  03/08/2020 head CT and prior. FINDINGS: CTA NECK FINDINGS Aortic arch: Standard branching. Imaged portion shows no evidence of aneurysm or dissection. Extensive noncalcified atheromatous plaque involving the aortic arch and great vessel origins. Some of the plaque demonstrates Kerria Sapien pedunculated appearance. Approximately 30% luminal narrowing of the innominate artery secondary to atheromatous disease. No significant stenosis of the left common carotid and left subclavian artery origins. Right carotid system: Noncalcified atheromatous disease involving the proximal right common carotid artery without significant luminal narrowing. Carotid bifurcation calcified atheromatous plaque without high-grade narrowing. Mild narrowing of the proximal right petrous segment secondary to noncalcified atheromatous plaque. Left carotid system: Patent normal caliber common carotid artery. Carotid bifurcation atherosclerotic calcifications with approximately 80-90% narrowing of the proximal ICA. The carotid bulb is narrowed by approximately 50%. Vertebral arteries: Dominant right vertebral artery. Right V1 and V4 segment calcified atheromatous plaque with mild narrowing. Left vertebral artery atheromatous disease with no greater than mild narrowing. Skeleton: Multilevel spondylosis.  No acute osseous abnormalities. Other neck: No adenopathy.  No soft tissue mass. Upper chest: Emphysema. Dependent atelectasis. Motion artifact limits evaluation. Review of the MIP images confirms the above findings CTA HEAD FINDINGS Anterior circulation: Bilateral carotid siphon atherosclerotic calcifications. Mild narrowing of the left paraclinoid ICA. Patent, normal caliber appearance of the anterior and middle cerebral arteries. Posterior circulation: Dominant right vertebral artery. Normal caliber patent basilar,  superior cerebellar and posterior cerebral arteries. Venous sinuses: As permitted by contrast timing, patent. Anatomic variants: None. 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 30% luminal narrowing of the innominate secondary to atheromatous plaque. Approximately 50% narrowing of the left carotid bulb and 80-90% narrowing of the proximal left ICA secondary to atheromatous plaque. Mild right V1 and V4 segment narrowing secondary to atheromatous disease. Mild narrowing of the proximal right petrous and left paraclinoid ICA. Electronically Signed   By: Primitivo Gauze M.D.   On: 03/08/2020 19:00   CT Cervical Spine Wo Contrast  Result Date: 03/08/2020 CLINICAL DATA:  Neck trauma (Age >= 65y) Post fall with vomiting. EXAM: CT CERVICAL SPINE WITHOUT CONTRAST TECHNIQUE: Multidetector CT imaging of the cervical spine was performed without intravenous contrast. Multiplanar CT image reconstructions were also generated. COMPARISON:  None. FINDINGS: Alignment: Normal. Skull base and vertebrae: No acute fracture. Vertebral body heights are maintained. The dens and skull base are intact. Soft tissues and spinal canal: No prevertebral fluid or swelling. No visible canal hematoma. Disc levels: Disc space narrowing and endplate spurring at I4-P3. There is multilevel endplate spurring at additional levels with preservation of disc spaces. Facet hypertrophy at C4-C5 on the left and multilevel on the right. Upper chest: Breathing motion artifact. No evidence of acute abnormality. Other: Carotid calcifications. IMPRESSION: Degenerative change in the cervical spine without acute fracture or subluxation. Electronically Signed   By: Keith Rake M.D.   On: 03/08/2020 17:04   MR BRAIN WO CONTRAST  Result Date: 03/08/2020 CLINICAL DATA:  Neuro deficit EXAM: MRI HEAD WITHOUT CONTRAST TECHNIQUE: Multiplanar, multiecho pulse  sequences of the brain and surrounding structures were obtained without intravenous contrast. COMPARISON:  Concurrent CTA head and neck. 12/07/2019 MRI head and prior. FINDINGS: Brain: Moderate cerebral atrophy with ex vacuo dilatation. Scattered and confluent  T2/FLAIR hyperintense foci involving the periventricular and subcortical white matter are nonspecific however commonly associated with chronic microvascular ischemic changes. Small foci of restricted diffusion involving the left frontal lobe at the vertex and left occipital lobe (2:20, 44). No intracranial hemorrhage. No midline shift, mass lesion or extra-axial fluid collection. Sequela of remote bilateral basal ganglia, thalamic and cerebellar insults. Vascular: Please see concurrent CTA Skull and upper cervical spine: Normal marrow signal. Sinuses/Orbits: Normal orbits. Mild pansinus disease. No mastoid effusion. Other: None. IMPRESSION: Small acute infarcts involving the left frontal and occipital lobes. Moderate cerebral atrophy and chronic microvascular ischemic changes. Remote bilateral basal ganglia, thalamic and cerebellar insults. Mild pansinus disease. These results were called by telephone at the time of interpretation on 03/08/2020 at 7:13 pm to provider ADAM CURATOLO , who verbally acknowledged these results. Electronically Signed   By: Primitivo Gauze M.D.   On: 03/08/2020 19:15   MR CERVICAL SPINE WO CONTRAST  Result Date: 03/08/2020 CLINICAL DATA:  Initial evaluation for acute myelopathy. EXAM: MRI CERVICAL AND THORACIC SPINE WITHOUT CONTRAST TECHNIQUE: Multiplanar and multiecho pulse sequences of the cervical spine, to include the craniocervical junction and cervicothoracic junction, and the thoracic spine, were obtained without intravenous contrast. COMPARISON:  None available. FINDINGS: MRI CERVICAL SPINE FINDINGS Examination degraded by motion artifact. Alignment: Physiologic with preservation of the normal cervical lordosis. No  listhesis. Vertebrae: Vertebral body height maintained without acute or chronic fracture. Bone marrow signal intensity within normal limits. No worrisome osseous lesions. Minimal reactive marrow edema seen about the left C6-7 facet due to facet arthritis. No other abnormal marrow edema. Cord: Normal signal and morphology. Posterior Fossa, vertebral arteries, paraspinal tissues: Visualized brain and posterior fossa within normal limits. Craniocervical junction normal. Paraspinous and prevertebral soft tissues within normal limits. Normal intravascular flow voids seen within the vertebral arteries bilaterally. Disc levels: C2-C3: Negative interspace. Right greater than left facet hypertrophy with associated ankylosis on the right. No spinal stenosis. Foramina remain patent. C3-C4: Minimal disc bulge with uncovertebral hypertrophy. Moderate right greater than left facet and ligament flavum hypertrophy. No significant spinal stenosis. Moderate bilateral C4 foraminal narrowing. C4-C5: Mild disc bulge with uncovertebral hypertrophy. Superimposed small central/right paracentral disc protrusion mildly indents the ventral thecal sac (series 5, image 20). Moderate right with mild left facet hypertrophy. No significant spinal stenosis. Moderate left with mild to moderate right C5 foraminal stenosis. C5-C6: Degenerative intervertebral disc space narrowing with diffuse disc bulge and bilateral uncovertebral hypertrophy. Moderate left with mild right facet degeneration with associated ankylosis on the left. No significant spinal stenosis. Moderate left with mild right C6 foraminal narrowing. C6-C7: Mild annular disc bulge. Mild to moderate bilateral facet hypertrophy. No significant spinal stenosis. Foramina remain patent. C7-T1: Minimal disc bulge. Mild facet hypertrophy. No canal or foraminal stenosis. MRI THORACIC SPINE FINDINGS Examination degraded by motion artifact. Alignment: Mild scoliosis with exaggeration of the normal  thoracic kyphosis. No listhesis. Vertebrae: Vertebral body height maintained without acute or chronic fracture. Bone marrow signal intensity within normal limits. Few scattered benign hemangiomata noted. No worrisome osseous lesions. No abnormal marrow edema. Cord: Normal signal and morphology. No convincing cord signal abnormality on this motion degraded exam. Conus medullaris terminates at approximately the L1 level. Paraspinal and other soft tissues: Negative. Disc levels: No significant degenerative disc disease seen for patient age. No disc bulge or focal disc herniation. No significant canal or foraminal stenosis. No neural impingement. IMPRESSION: 1. Normal MRI of the cervicothoracic spinal cord. No cord signal changes to suggest  myelopathy or other abnormality. 2. Mild multilevel cervical spondylosis and facet arthrosis without significant spinal stenosis. Associated mild to moderate bilateral C4 through C6 foraminal narrowing as above. 3. No significant disc pathology within the thoracic spine for patient age. No thoracic spinal stenosis or neural impingement. Electronically Signed   By: Jeannine Boga M.D.   On: 03/08/2020 23:30   MR THORACIC SPINE WO CONTRAST  Result Date: 03/08/2020 CLINICAL DATA:  Initial evaluation for acute myelopathy. EXAM: MRI CERVICAL AND THORACIC SPINE WITHOUT CONTRAST TECHNIQUE: Multiplanar and multiecho pulse sequences of the cervical spine, to include the craniocervical junction and cervicothoracic junction, and the thoracic spine, were obtained without intravenous contrast. COMPARISON:  None available. FINDINGS: MRI CERVICAL SPINE FINDINGS Examination degraded by motion artifact. Alignment: Physiologic with preservation of the normal cervical lordosis. No listhesis. Vertebrae: Vertebral body height maintained without acute or chronic fracture. Bone marrow signal intensity within normal limits. No worrisome osseous lesions. Minimal reactive marrow edema seen about the  left C6-7 facet due to facet arthritis. No other abnormal marrow edema. Cord: Normal signal and morphology. Posterior Fossa, vertebral arteries, paraspinal tissues: Visualized brain and posterior fossa within normal limits. Craniocervical junction normal. Paraspinous and prevertebral soft tissues within normal limits. Normal intravascular flow voids seen within the vertebral arteries bilaterally. Disc levels: C2-C3: Negative interspace. Right greater than left facet hypertrophy with associated ankylosis on the right. No spinal stenosis. Foramina remain patent. C3-C4: Minimal disc bulge with uncovertebral hypertrophy. Moderate right greater than left facet and ligament flavum hypertrophy. No significant spinal stenosis. Moderate bilateral C4 foraminal narrowing. C4-C5: Mild disc bulge with uncovertebral hypertrophy. Superimposed small central/right paracentral disc protrusion mildly indents the ventral thecal sac (series 5, image 20). Moderate right with mild left facet hypertrophy. No significant spinal stenosis. Moderate left with mild to moderate right C5 foraminal stenosis. C5-C6: Degenerative intervertebral disc space narrowing with diffuse disc bulge and bilateral uncovertebral hypertrophy. Moderate left with mild right facet degeneration with associated ankylosis on the left. No significant spinal stenosis. Moderate left with mild right C6 foraminal narrowing. C6-C7: Mild annular disc bulge. Mild to moderate bilateral facet hypertrophy. No significant spinal stenosis. Foramina remain patent. C7-T1: Minimal disc bulge. Mild facet hypertrophy. No canal or foraminal stenosis. MRI THORACIC SPINE FINDINGS Examination degraded by motion artifact. Alignment: Mild scoliosis with exaggeration of the normal thoracic kyphosis. No listhesis. Vertebrae: Vertebral body height maintained without acute or chronic fracture. Bone marrow signal intensity within normal limits. Few scattered benign hemangiomata noted. No worrisome  osseous lesions. No abnormal marrow edema. Cord: Normal signal and morphology. No convincing cord signal abnormality on this motion degraded exam. Conus medullaris terminates at approximately the L1 level. Paraspinal and other soft tissues: Negative. Disc levels: No significant degenerative disc disease seen for patient age. No disc bulge or focal disc herniation. No significant canal or foraminal stenosis. No neural impingement. IMPRESSION: 1. Normal MRI of the cervicothoracic spinal cord. No cord signal changes to suggest myelopathy or other abnormality. 2. Mild multilevel cervical spondylosis and facet arthrosis without significant spinal stenosis. Associated mild to moderate bilateral C4 through C6 foraminal narrowing as above. 3. No significant disc pathology within the thoracic spine for patient age. No thoracic spinal stenosis or neural impingement. Electronically Signed   By: Jeannine Boga M.D.   On: 03/08/2020 23:30   ECHOCARDIOGRAM COMPLETE  Result Date: 03/09/2020    ECHOCARDIOGRAM REPORT   Patient Name:   DAXON KYNE Date of Exam: 03/09/2020 Medical Rec #:  [id number]  Height:       69.0 in Accession #:    [id number]    Weight:       185.4 lb Date of Birth:  [birth year]     BSA:          2.001 m Patient Age:    89 years      BP:           132/75 mmHg Patient Gender: M             HR:           71 bpm. Exam Location:  Inpatient Procedure: 2D Echo, Cardiac Doppler and Color Doppler Indications:    Stroke 434.91 / I163.9  History:        Patient has no prior history of Echocardiogram examinations.                 Risk Factors:Hypertension, Dyslipidemia and Diabetes. GERD. CKD.                 Abdominal Aortic Aneurysm.  Sonographer:    Jonelle Sidle Dance Referring Phys: [id number] [place]  1. Left ventricular ejection fraction, by estimation, is 55 to 60%. The left ventricle has normal function. The left ventricle has no regional wall motion abnormalities. Left ventricular diastolic  parameters were normal.  2. Right ventricular systolic function is normal. The right ventricular size is normal.  3. Left atrial size was mildly dilated.  4. The mitral valve is normal in structure. Mild mitral valve regurgitation. No evidence of mitral stenosis.  5. The aortic valve is tricuspid. Aortic valve regurgitation is mild. Mild aortic valve sclerosis is present, with no evidence of aortic valve stenosis.  6. The inferior vena cava is normal in size with greater than 50% respiratory variability, suggesting right atrial pressure of 3 mmHg. FINDINGS  Left Ventricle: Left ventricular ejection fraction, by estimation, is 55 to 60%. The left ventricle has normal function. The left ventricle has no regional wall motion abnormalities. The left ventricular internal cavity size was normal in size. There is  no left ventricular hypertrophy. Left ventricular diastolic parameters were normal. Right Ventricle: The right ventricular size is normal. No increase in right ventricular wall thickness. Right ventricular systolic function is normal. Left Atrium: Left atrial size was mildly dilated. Right Atrium: Right atrial size was normal in size. Pericardium: There is no evidence of pericardial effusion. Mitral Valve: The mitral valve is normal in structure. Mild mitral valve regurgitation. No evidence of mitral valve stenosis. Tricuspid Valve: The tricuspid valve is normal in structure. Tricuspid valve regurgitation is not demonstrated. No evidence of tricuspid stenosis. Aortic Valve: The aortic valve is tricuspid. Aortic valve regurgitation is mild. Aortic regurgitation PHT measures 390 msec. Mild aortic valve sclerosis is present, with no evidence of aortic valve stenosis. Pulmonic Valve: The pulmonic valve was normal in structure. Pulmonic valve regurgitation is not visualized. No evidence of pulmonic stenosis. Aorta: The aortic root is normal in size and structure. Venous: The inferior vena cava is normal in size with  greater than 50% respiratory variability, suggesting right atrial pressure of 3 mmHg. IAS/Shunts: No atrial level shunt detected by color flow Doppler.  LEFT VENTRICLE PLAX 2D LVIDd:         5.30 cm LVIDs:         3.80 cm LV PW:         1.10 cm LV IVS:        1.10 cm LVOT diam:  2.20 cm LV SV:         90 LV SV Index:   45 LVOT Area:     3.80 cm  RIGHT VENTRICLE          IVC RV Basal diam:  1.90 cm  IVC diam: 2.00 cm TAPSE (M-mode): 1.7 cm LEFT ATRIUM             Index       RIGHT ATRIUM          Index LA diam:        4.20 cm 2.10 cm/m  RA Area:     9.05 cm LA Vol (A2C):   85.8 ml 42.89 ml/m RA Volume:   13.90 ml 6.95 ml/m LA Vol (A4C):   66.5 ml 33.24 ml/m LA Biplane Vol: 76.3 ml 38.14 ml/m  AORTIC VALVE LVOT Vmax:   111.50 cm/s LVOT Vmean:  73.400 cm/s LVOT VTI:    0.236 m AI PHT:      390 msec  AORTA Ao Root diam: 3.40 cm Ao Asc diam:  3.50 cm MITRAL VALVE MV Area (PHT): 2.95 cm    SHUNTS MV Decel Time: 257 msec    Systemic VTI:  0.24 m MV E velocity: 90.40 cm/s  Systemic Diam: 2.20 cm MV Vernor Monnig velocity: 80.60 cm/s MV E/Bobbie Virden ratio:  1.12 Jenkins Rouge MD Electronically signed by Jenkins Rouge MD Signature Date/Time: 03/09/2020/11:14:58 AM    Final    VAS US CAROTID  Result Date: 03/09/2020 Carotid Arterial Duplex Study Indications:       CVA and Aphasia. History of carotid stenosis. Risk Factors:      Hypertension, hyperlipidemia. Other Factors:     Abdominal Aortic Endovascular Stent Graft in 04/2013. Comparison Study:  CTA of the neck on 03/08/2020 showed right ICA withoug                    significant stenosis. Left proximal ICA 80-90% stenosis with                    approximately 50% narrowing at the bulb.                    Carotid US on 05/31/2016 Performing Technologist: Velva Harman Sturdivant RDMS, RVT  Examination Guidelines: [place] complete evaluation includes B-mode imaging, spectral Doppler, color Doppler, and power Doppler as needed of all accessible portions of each vessel. Bilateral testing is considered an  integral part of [facility] complete examination. Limited examinations for reoccurring indications may be performed as noted.  Right Carotid Findings: +----------+--------+--------+--------+------------------+--------+           PSV cm/sEDV cm/sStenosisPlaque DescriptionComments +----------+--------+--------+--------+------------------+--------+ CCA Prox  81      17                                         +----------+--------+--------+--------+------------------+--------+ CCA Distal75      17                                         +----------+--------+--------+--------+------------------+--------+ ICA Prox  81      19                                         +----------+--------+--------+--------+------------------+--------+  ICA Distal81      20                                         +----------+--------+--------+--------+------------------+--------+ ECA       136     12                                         +----------+--------+--------+--------+------------------+--------+ +----------+--------+-------+----------------+-------------------+           PSV cm/sEDV cmsDescribe        Arm Pressure (mmHG) +----------+--------+-------+----------------+-------------------+ Subclavian206            Multiphasic, WNL                    +----------+--------+-------+----------------+-------------------+ +---------+--------+--+--------+--+---------+ VertebralPSV cm/s64EDV cm/s19Antegrade +---------+--------+--+--------+--+---------+  Left Carotid Findings: +----------+--------+--------+--------+------------------+--------+           PSV cm/sEDV cm/sStenosisPlaque DescriptionComments +----------+--------+--------+--------+------------------+--------+ CCA Prox  121     21                                         +----------+--------+--------+--------+------------------+--------+ CCA Distal75      18                                          +----------+--------+--------+--------+------------------+--------+ ICA Prox  422     76      60-79%  homogeneous                +----------+--------+--------+--------+------------------+--------+ ICA Mid   91      30                                         +----------+--------+--------+--------+------------------+--------+ ICA Distal62      23                                         +----------+--------+--------+--------+------------------+--------+ ECA       243     27                                         +----------+--------+--------+--------+------------------+--------+ +----------+--------+--------+----------------+-------------------+           PSV cm/sEDV cm/sDescribe        Arm Pressure (mmHG) +----------+--------+--------+----------------+-------------------+ Subclavian170             Multiphasic, WNL                    +----------+--------+--------+----------------+-------------------+ +---------+--------+--+--------+--+---------+ VertebralPSV cm/s62EDV cm/s14Antegrade +---------+--------+--+--------+--+---------+ On 2D imaging the narrowing appears to be at the bulb/origin level with PSV suggest 80-99% stenosis. However, EDV suggest less - 60-79% stenosis.  Summary: Right Carotid: The extracranial vessels were near-normal with only minimal wall                thickening or plaque. Left Carotid: Velocities in the  left ICA are consistent with Tywone Bembenek 60-79% stenosis.               PSV at the bulb level suggest 80-99% stenosis.  *See table(s) above for measurements and observations.  Electronically signed by Monica Martinez MD on 03/09/2020 at 4:45:36 PM.    Final         Scheduled Meds: . aspirin  81 mg Oral Daily  . Chlorhexidine Gluconate Cloth  6 each Topical Daily  . citalopram  20 mg Oral Daily  . clopidogrel  75 mg Oral Daily  . donepezil  10 mg Oral QHS  . enoxaparin (LOVENOX) injection  40 mg Subcutaneous QHS  . folic acid  1 mg Oral Daily    . insulin aspart  0-5 Units Subcutaneous QHS  . insulin aspart  0-9 Units Subcutaneous TID WC  . LORazepam  0-4 mg Intravenous Q6H   Followed by  . LORazepam  0-4 mg Intravenous Q12H  . memantine  10 mg Oral BID  . multivitamin with minerals  1 tablet Oral Daily  . rosuvastatin  40 mg Oral q1800  . [START ON 03/12/2020] thiamine injection  100 mg Intravenous Daily  . vitamin B-12  1,000 mcg Oral Daily   Continuous Infusions: . thiamine injection 400 mg (03/10/20 1429)     LOS: 1 day    Time spent: over 30 min    [name] Helper, MD [facility]   To contact the attending provider between 7A-7P or the covering provider during after hours 7P-7A, please log into the web site [email or web address] and access using universal [place] password for that web site. If you do not have the password, please call the hospital operator.  03/10/2020, 4:01 PM

## 2020-03-10 NOTE — Progress Notes (Signed)
Occupational Therapy Treatment Patient Details Name: [name] [name] MRN: [id number] DOB: [birth year] Today's Date: 03/10/2020    History of present illness Pt is a 74 y/o male with PMH of memory loss, CKD, DM2, AAA, HTN, presenting to ED for evaluation of confusion, hypoglycemia, and a fall. 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    OT comments  Patient standing in room upon entry, NT assisting patient to restroom.  Patient completing toileting standing with supervision, but requires cueing for hand hygiene after toileting- completing with supervision.    Functional cognition further assessed with The Pillbox Test: A Measure of Executive Functioning and Estimate of Medication Management. A straight pass/fail designation is determined by 3 or more errors of omission or misplacement on the task. The pt completed the test with 23 errors, in 11 minutes, and failed the test--demonstrating poor attention, poor planning, mental flexibility, suboptimal search strategies, concrete thinking and the inability to multitask (see below for details). Pt agreeable to have spouse assist with all driving at dc.  Based on performance, updated dc recommendations to OP OT services and patient reports that he feels this is different then before his CVA (althogh his memory deficits are baseline).  Will follow acutely.    Follow Up Recommendations  Outpatient OT;Supervision/Assistance - 24 hour    Equipment Recommendations  3 in 1 bedside commode (for shower chair )    Recommendations for Other Services      Precautions / Restrictions Precautions Precautions: Fall Restrictions Weight Bearing Restrictions: No       Mobility Bed Mobility Overal bed mobility: Modified Independent Bed Mobility: Sit to Supine       Sit to supine: Modified independent (Device/Increase time)   General bed mobility comments: HOB flat and rails lowered to simulate home environment.  Transitioned to EOB quickly and without difficulty.   Transfers Overall transfer level: Modified independent Equipment used: None Transfers: Sit to/from Stand           General transfer comment: No unsteadiness or LOB noted. Pt with min UE use to power-up to full stand.     Balance Overall balance assessment: Needs assistance Sitting-balance support: Feet supported;No upper extremity supported Sitting balance-Leahy Scale: Fair Sitting balance - Comments: Posterior lean during MMT   Standing balance support: No upper extremity supported;During functional activity Standing balance-Leahy Scale: Fair                   Standardized Balance Assessment Standardized Balance Assessment : Dynamic Gait Index   Dynamic Gait Index Level Surface: Normal Change in Gait Speed: Normal Gait with Horizontal Head Turns: Normal Gait with Vertical Head Turns: Normal Gait and Pivot Turn: Normal Step Over Obstacle: Mild Impairment Step Around Obstacles: Normal Steps: Normal Total Score: 23     ADL either performed or assessed with clinical judgement   ADL Overall ADL's : Needs assistance/impaired     Grooming: Supervision/safety;Wash/dry hands;Standing Grooming Details (indicate cue type and reason): cueing to wash hands after toileting                     Toileting- Clothing Manipulation and Hygiene: Supervision/safety;Sit to/from stand Toileting - Clothing Manipulation Details (indicate cue type and reason): for safety      Functional mobility during ADLs: Supervision/safety       Vision       Perception     Praxis      Cognition Arousal/Alertness: Awake/alert Behavior During Therapy: [facility] for  tasks assessed/performed Overall Cognitive Status: History of cognitive impairments - at baseline Area of Impairment: Memory;Problem solving;Attention;Awareness;Following commands                   Current Attention Level: Sustained Memory: Decreased  short-term memory Following Commands: Follows multi-step commands inconsistently   Awareness: Emergent Problem Solving: Difficulty sequencing;Requires verbal cues;Slow processing General Comments: pt with hx of memory deficits, engaged in pill box test for further functional cognition testing see below for details    General Comments: Assessed using the Pill Box Test. Pt failed the assessment, demonstrating poor planning, mental flexibility, suboptimal search strategies, concrete thinking and the inability to multitask. Pt had a total of 23 errors, where more than 3 errors is considered a fail.  Errors: One tablet 3x/day (yellow) - 12 errors (omission/misplacement) One tablet 2x/day with breakfast and dinner (green) - 10 errors (omission/misplacement) One tablet in the morning (Blue)- 0 errors (omission/misplacement) One tablet daily at bedtime (orange) - 0 errors(omission/misplacement) One tablet every other day (red) - 0 errors (omission/misplacement)   Number of misplaced movement errors (pills placed in incorrect compartment)- 6 Number of total errors (sum of omissions; misplacements) - 16 Total time to complete task (allowed 5 min) - 10 min  Patient began by reading pill bottle instructions without assistance, but once starting to place medications in pill box provided with 1 verbal cue to ensure understanding of pill box (1 week, multiple times/day).  Able to correct every other day pill with this instruction, and no further cueing provided.  Patient correctly placed morning, bedtime pills; then demonstrated difficulty with following simple directions for "take 1 pill 2x per day at breakfast and dinner" putting 2 pills in sporadically and then looking confused when he ran out of pills and placing "3x/day pills" in only 3 days but doing those 3 days correctly.  Observed patient looking attention and difficulty multitasking, by continuing to refer back to pill bottle once starting to sort a  medicine; also noted poor organizational skills as patient skipped a medication in the line but was able to recall to go back to it.  Based on this performance, discussed how this relates to higher level cognitive processing and IADL engagement --especially driving.  Patient verbalized understanding and good awareness, agreeable to have his spouse assist with with driving and higher level IADLs at this time (cooking, etc), as she already assists with med mgmt.           Exercises     Shoulder Instructions       General Comments      Pertinent Vitals/ Pain       Pain Assessment: No/denies pain  Home Living                                          Prior Functioning/Environment              Frequency  Min 2X/week        Progress Toward Goals  OT Goals(current goals can now be found in the care plan section)  Progress towards OT goals: Progressing toward goals  Acute Rehab OT Goals Patient Stated Goal: home ASAP OT Goal Formulation: With patient  Plan Discharge plan needs to be updated;Frequency remains appropriate    Co-evaluation                 AM-PAC OT "6  Clicks" Daily Activity     Outcome Measure   Help from another person eating meals?: None Help from another person taking care of personal grooming?: A Little Help from another person toileting, which includes using toliet, bedpan, or urinal?: A Little Help from another person bathing (including washing, rinsing, drying)?: A Little Help from another person to put on and taking off regular upper body clothing?: A Little Help from another person to put on and taking off regular lower body clothing?: A Little 6 Click Score: 19    End of Session    OT Visit Diagnosis: Other abnormalities of gait and mobility (R26.89);History of falling (Z91.81);Other symptoms and signs involving cognitive function   Activity Tolerance Patient tolerated treatment well   Patient Left in bed;with call  bell/phone within reach;with bed alarm set   Nurse Communication Mobility status        Time: 1586-8257 OT Time Calculation (min): 26 min  Charges: OT General Charges $OT Visit: 1 Visit OT Treatments $Self Care/Home Management : 8-22 mins $Cognitive Funtion inital: Initial 15 mins  Jolaine Artist, OT Acute Rehabilitation Services Pager [phone] Office [phone]    Delight Stare 03/10/2020, 3:01 PM

## 2020-03-10 NOTE — Progress Notes (Signed)
Physical Therapy Treatment Patient Details Name: [name] [name] MRN: [id number] DOB: February 05, 1946 Today's Date: 03/10/2020    History of Present Illness Pt is a 74 y/o male with PMH of memory loss, CKD, DM2, AAA, HTN, presenting to ED for evaluation of confusion, hypoglycemia, and a fall. 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     PT Comments    Pt progressing towards physical therapy goals. Pt was eager to participate with PT this afternoon, and overall demonstrated a good rehab effort. Repeated DGI and pt scored 23/24 which is an improvement from evaluation. Processing multi-step commands appeared to be improved today with the DGI however unsure if the pt remembered the activities from yesterday's session or if processing was truly improved. He did continue to have difficulty with path finding activity. Pt utilized signage in the hallway without cues however was not following the directions on the sign well. Will continue to follow and progress as able per POC.    Follow Up Recommendations  No PT follow up;Supervision - Intermittent     Equipment Recommendations  None recommended by PT    Recommendations for Other Services       Precautions / Restrictions Precautions Precautions: Fall Restrictions Weight Bearing Restrictions: No    Mobility  Bed Mobility Overal bed mobility: Modified Independent Bed Mobility: Supine to Sit           General bed mobility comments: HOB flat and rails lowered to simulate home environment. Transitioned to EOB quickly and without difficulty.   Transfers Overall transfer level: Modified independent Equipment used: None Transfers: Sit to/from Stand           General transfer comment: No unsteadiness or LOB noted. Pt with min UE use to power-up to full stand.   Ambulation/Gait Ambulation/Gait assistance: Supervision Gait Distance (Feet): 400 Feet Assistive device: None Gait Pattern/deviations:  Step-through pattern;Decreased stride length;Drifts right/left Gait velocity: Decreased Gait velocity interpretation: 1.31 - 2.62 ft/sec, indicative of limited community ambulator General Gait Details: Generally improved compared to evaluation. Pt appears stiff and holding his L hip - reports he feels stiff and sore from laying in the bed.    Stairs Stairs: Yes Stairs assistance: Supervision Stair Management: No rails;Alternating pattern;Forwards Number of Stairs: 5 General stair comments: VC's to attempt without rails for DGI. Pt completed well.    Wheelchair Mobility    Modified Rankin (Stroke Patients Only)       Balance Overall balance assessment: Needs assistance Sitting-balance support: Feet supported;No upper extremity supported Sitting balance-Leahy Scale: Fair Sitting balance - Comments: Posterior lean during MMT   Standing balance support: No upper extremity supported;During functional activity Standing balance-Leahy Scale: Fair                   Standardized Balance Assessment Standardized Balance Assessment : Dynamic Gait Index   Dynamic Gait Index Level Surface: Normal Change in Gait Speed: Normal Gait with Horizontal Head Turns: Normal Gait with Vertical Head Turns: Normal Gait and Pivot Turn: Normal Step Over Obstacle: Mild Impairment Step Around Obstacles: Normal Steps: Normal Total Score: 23      Cognition Arousal/Alertness: Awake/alert Behavior During Therapy: WFL for tasks assessed/performed Overall Cognitive Status: History of cognitive impairments - at baseline Area of Impairment: Memory;Problem solving;Attention                   Current Attention Level: Sustained Memory: Decreased short-term memory       Problem Solving: Requires verbal  cues        Exercises      General Comments        Pertinent Vitals/Pain Pain Assessment: No/denies pain    Home Living                      Prior Function             PT Goals (current goals can now be found in the care plan section) Acute Rehab PT Goals Patient Stated Goal: home ASAP PT Goal Formulation: With patient Time For Goal Achievement: 03/16/20 Potential to Achieve Goals: Good Progress towards PT goals: Progressing toward goals    Frequency    Min 4X/week      PT Plan Current plan remains appropriate    Co-evaluation              AM-PAC PT "6 Clicks" Mobility   Outcome Measure  Help needed turning from your back to your side while in a flat bed without using bedrails?: None Help needed moving from lying on your back to sitting on the side of a flat bed without using bedrails?: None Help needed moving to and from a bed to a chair (including a wheelchair)?: None Help needed standing up from a chair using your arms (e.g., wheelchair or bedside chair)?: None Help needed to walk in hospital room?: None Help needed climbing 3-5 steps with a railing? : None 6 Click Score: 24    End of Session Equipment Utilized During Treatment: Gait belt Activity Tolerance: Patient tolerated treatment well Patient left: in chair;with call bell/phone within reach;with nursing/sitter in room Production assistant, radio and instructor in room) Nurse Communication: Mobility status PT Visit Diagnosis: Unsteadiness on feet (R26.81);Other symptoms and signs involving the nervous system (R29.898)     Time: 3254-9826 PT Time Calculation (min) (ACUTE ONLY): 15 min  Charges:  $Gait Training: 8-22 mins                     Rolinda Roan, PT, DPT Acute Rehabilitation Services Pager: [phone] Office: [phone]    Thelma Comp 03/10/2020, 2:45 PM

## 2020-03-10 NOTE — Progress Notes (Addendum)
STROKE TEAM PROGRESS NOTE   Interval History   Patient is up and resting in bed upon entering the room. He has no family at bedside. He is asking when his urinary catheter can possibly be removed. No acute events overnight.   Vitals:   03/09/20 2325 03/10/20 0410 03/10/20 0700 03/10/20 1100  BP: (!) 154/64 (!) 128/56 (!) 144/68 (!) 145/64  Pulse: 60 63 62 62  Resp: 17 17 14 18   Temp: 98.5 F (36.9 C) 98.7 F (37.1 C) 98.4 F (36.9 C) 98.4 F (36.9 C)  TempSrc: Oral Oral Oral Oral  SpO2: 96% 95% 94% 96%  Weight:      Height:        Pertinent Imaging and Lab Work   03/08/20 CT Head WO Contrast 1. No acute intracranial abnormality. No skull fracture. 2. Stable atrophy, chronic small vessel ischemia, and remote basal gangliar lacunar infarcts.  03/08/20 MRI Brain WO Contrast Small acute infarcts involving the left frontal and occipital lobes.Moderate cerebral atrophy and chronic microvascular ischemicchanges. Remote bilateral basal ganglia, thalamic and cerebellar Insults. Mild pansinus disease.  03/08/20 CT Angio Head and Neck W WO Contrast 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 30% luminal narrowing of the innominate secondary to atheromatous plaque. Approximately 50% narrowing of the left carotid bulb and 80-90% narrowing of the proximal left ICA secondary to atheromatous plaque. Mild right V1 and V4 segment narrowing secondary to atheromatous disease. Mild narrowing of the proximal right petrous and left paraclinoid ICA.  03/08/20 MR Cervical +Thoracic Spine WO Contrast 1. Normal MRI of the cervicothoracic spinal cord. No cord signalchanges to suggest myelopathy or other abnormality. 2. Mild multilevel cervical spondylosis and facet arthrosis without significant spinal stenosis. Associated mild to moderate bilateral C4 through C6 foraminal narrowing as above. 3. No significant disc pathology within the thoracic spine for patient age. No thoracic  spinal stenosis or neural impingement.  03/09/20 Echocardiogram Complete  1. Left ventricular ejection fraction, by estimation, is 55 to 60%. The left ventricle has normal function. The left ventricle has no regional wall motion abnormalities. Left ventricular diastolic parameters were  normal.  2. Right ventricular systolic function is normal. The right ventricular size is normal.  3. Left atrial size was mildly dilated.  4. The mitral valve is normal in structure. Mild mitral valve regurgitation. No evidence of mitral stenosis.  5. The aortic valve is tricuspid. Aortic valve regurgitation is mild. Mild aortic valve sclerosis is present, with no evidence of aortic valve stenosis.  6. The inferior vena cava is normal in size with greater than 50%  respiratory variability, suggesting right atrial pressure of 3 mmHg.   03/09/20 Bilateral Carotid US  Right Carotid: The extracranial vessels were near-normal with only minimal wall thickening or plaque.  Left Carotid: Velocities in the left ICA are consistent with a 60-79% stenosis. PSV at the bulb level suggest 80-99% stenosis.   Stroke labs: Hemoglobin A1C: 6.8 Lipid panel: Total cholesterol 166, Triglycerides 105, HDL 50, LDL 95  Physical Examination Constitutional: Alert, resting in bed, very pleasant  MS: AAOx4 Speech: Fluent with repetition and naming intact CN: EOMI, VFF, No facial droop, sensation intact to light touch in V1,V2,V3, tongue midline, shoulder shrug intact  Strength: 5/5 throughout  Sensation: Intact to light tough throughout  Coordination: Intact FNF + HTS  Gait: Deferred   NIHSS 0   Assessment and Plan Mr. Kaelob Everton is a 74 y.o. male w/pmh of DMII, HTN, AAA status post graph (  2014), Femoral artery stenosis, [name], memory changes followed at [facility], chronic back pain who presents with drowsiness + a fall in the bathroom in which he hit his head but did not lose consciousness. At the time, his  wife noted that he had difficulty finding the correct words to use and seemed confused thus EMS was called. BG was noted to be 52, was treated however speech difficulty and confusion persisted. In the ED his MRI Brain was notable for small acute stroke involving the left frontal and occipital lobes for which neurology was consulted.   # Left Frontal and Occipital Lobe Stroke  He presented with the symptoms described above and his MRI was notable for small acute stroke involving the left frontal and occipital lobes. His CTA Head and Neck was notable for approximately 50% narrowing of the left carotid bulb and 80-90% narrowing of the proximal left ICA secondary to atheromatous plaque. Mild right V1 and V4 segment narrowing secondary to atheromatous disease. Echocardiogram revealed EF 55 to 60 %, left atrium mildly dilated, left ventricle with normal function, mild mitral valve regurgitation. A Carotid US was recommend by vascular surgery and findings were pertinent for 60-79 % stenosis of the left carotid, PSV at the bulb suggesting 80-99 % stenosis, right carotid with minimal thickening or plaque. Stroke labs have been completed including hemoglobin A1C 6.8, LDL 95. His stroke work up is complete at this time. More than likely his stroke etiology is due to large artery atherosclerosis; given his strokes, including the left occipital stroke are in the L MCA/PCA watershed territory. DAPT was initiated for secondary stroke prevention. His home Crestor was increased from 20 mg to 40 mg, aiming for an LDL < 70. Given his symptomatic left carotid, vascular surgery was consulted on 03/09/20.  - Continue DAPT for secondary stroke prevention  - Continue Crestor 40 mg for secondary stroke prevention  - CEA with vascular surgery planned for 03/11/20 - At discharge please place an ambulatory referral to neurology with instructions to follow up with stroke provider   # Hypertension He has a history of HTN and takes  Metoprolol XL 100 mg QD at home. Blood pressure is currently trending 130-160. Recommend permissive HTN for the first 48 hours post stroke then can gradually normalize blood pressure. Given stenosis, be careful lowering blood pressure and avoid acute drops. Long term blood pressure goal is < 140/90. Will defer to vascular surgery for blood pressure goal post CEA.   # DMII  From a stroke prevention standpoint his A1C is at goal at 6.8, < 7. At discharge his home diabetic regimen can be resumed  # Ethanol abuse  # Chronic memory issues  - Thiamine level, empiric supplementation 400 mg q8hr x 3 days then 100 mg daily  - Folate supplementation  - B12 level and repletion if indicated  - CIWAS protocol   Will sign off at this time. Please reach out with any questions or concerns via Amion.   Ruta Hinds, NP  [facility] Nurse Practitioner Patient seen and discussed with attending physician Dr. Erlinda Hong  ATTENDING NOTE: I reviewed above note and agree with the assessment and plan. Pt was seen and examined.   Pt is sitting in bed and on the telephone. He requested to remove his foley catheter. I spoke with RN and she is going to remove his foley. Pt has no other complains. He is scheduled left CEA tomorrow with Dr. Carlis Abbott. Currently on DAPT and statin. Neurology  will sign off. Please call with questions. Pt will follow up with Dr. Leta Baptist at [facility] in about 4 weeks. Thanks for the consult.   Rosalin Hawking, MD PhD Stroke Neurology 03/10/2020 12:57 PM      To contact Stroke Continuity provider, please refer to [email or web address] After hours, contact General Neurology

## 2020-03-11 ENCOUNTER — Encounter (HOSPITAL_COMMUNITY): Payer: Self-pay | Admitting: Family Medicine

## 2020-03-11 ENCOUNTER — Inpatient Hospital Stay (HOSPITAL_COMMUNITY): Payer: Medicare PPO | Admitting: Anesthesiology

## 2020-03-11 ENCOUNTER — Encounter (HOSPITAL_COMMUNITY)
Admission: EM | Disposition: A | Discharge status: Home / Self Care | Payer: Self-pay | Source: Home / Self Care | Attending: Family Medicine

## 2020-03-11 HISTORY — PX: ENDARTERECTOMY: SHX5162

## 2020-03-11 HISTORY — PX: PATCH ANGIOPLASTY: SHX6230

## 2020-03-11 LAB — COMPREHENSIVE METABOLIC PANEL
ALT: 33 U/L (ref 0–44)
AST: 26 U/L (ref 15–41)
Albumin: 3.1 g/dL — ABNORMAL LOW (ref 3.5–5.0)
Alkaline Phosphatase: 116 U/L (ref 38–126)
Anion gap: 11 (ref 5–15)
BUN: 13 mg/dL (ref 8–23)
CO2: 24 mmol/L (ref 22–32)
Calcium: 9.6 mg/dL (ref 8.9–10.3)
Chloride: 105 mmol/L (ref 98–111)
Creatinine, Ser: 1.19 mg/dL (ref 0.61–1.24)
GFR calc Af Amer: >60 mL/min (ref >60)
GFR calc non Af Amer: 60 mL/min — ABNORMAL LOW (ref >60)
Glucose, Bld: 90 mg/dL (ref 70–99)
Potassium: 3.8 mmol/L (ref 3.5–5.1)
Sodium: 140 mmol/L (ref 135–145)
Total Bilirubin: 0.9 mg/dL (ref 0.3–1.2)
Total Protein: 6.1 g/dL — ABNORMAL LOW (ref 6.5–8.1)

## 2020-03-11 LAB — TYPE AND SCREEN
ABO/RH(D): O NEG
Antibody Screen: NEGATIVE

## 2020-03-11 LAB — GLUCOSE, CAPILLARY
Glucose-Capillary: 108 mg/dL — ABNORMAL HIGH (ref 70–99)
Glucose-Capillary: 135 mg/dL — ABNORMAL HIGH (ref 70–99)
Glucose-Capillary: 126 mg/dL — ABNORMAL HIGH (ref 70–99)
Glucose-Capillary: 137 mg/dL — ABNORMAL HIGH (ref 70–99)
Glucose-Capillary: 227 mg/dL — ABNORMAL HIGH (ref 70–99)

## 2020-03-11 LAB — CBC WITH DIFFERENTIAL/PLATELET
Abs Immature Granulocytes: 0.02 10*3/uL (ref 0.00–0.07)
Basophils Absolute: 0 10*3/uL (ref 0.0–0.1)
Basophils Relative: 0 %
Eosinophils Absolute: 0.4 10*3/uL (ref 0.0–0.5)
Eosinophils Relative: 6 %
HCT: 36.5 % — ABNORMAL LOW (ref 39.0–52.0)
Hemoglobin: 12.4 g/dL — ABNORMAL LOW (ref 13.0–17.0)
Immature Granulocytes: 0 %
Lymphocytes Relative: 27 %
Lymphs Abs: 1.9 10*3/uL (ref 0.7–4.0)
MCH: 30.9 pg (ref 26.0–34.0)
MCHC: 34 g/dL (ref 30.0–36.0)
MCV: 91 fL (ref 80.0–100.0)
Monocytes Absolute: 0.8 10*3/uL (ref 0.1–1.0)
Monocytes Relative: 11 %
Neutro Abs: 3.9 10*3/uL (ref 1.7–7.7)
Neutrophils Relative %: 56 %
Platelets: 163 10*3/uL (ref 150–400)
RBC: 4.01 MIL/uL — ABNORMAL LOW (ref 4.22–5.81)
RDW: 12.9 % (ref 11.5–15.5)
WBC: 7.1 10*3/uL (ref 4.0–10.5)
nRBC: 0 % (ref 0.0–0.2)

## 2020-03-11 LAB — MAGNESIUM: Magnesium: 2 mg/dL (ref 1.7–2.4)

## 2020-03-11 LAB — PROTIME-INR
INR: 0.9 (ref 0.8–1.2)
Prothrombin Time: 12.2 seconds (ref 11.4–15.2)

## 2020-03-11 LAB — POCT ACTIVATED CLOTTING TIME: Activated Clotting Time: 274 seconds

## 2020-03-11 LAB — PHOSPHORUS: Phosphorus: 3.4 mg/dL (ref 2.5–4.6)

## 2020-03-11 LAB — MRSA PCR SCREENING: MRSA by PCR: NEGATIVE

## 2020-03-11 SURGERY — ENDARTERECTOMY, CAROTID
Anesthesia: General | Site: Neck | Laterality: Left

## 2020-03-11 MED ORDER — LIDOCAINE HCL (PF) 1 % IJ SOLN
INTRAMUSCULAR | Status: AC
  Filled 2020-03-11: qty 5

## 2020-03-11 MED ORDER — LABETALOL HCL 5 MG/ML IV SOLN
INTRAVENOUS | Status: AC
  Filled 2020-03-11: qty 4

## 2020-03-11 MED ORDER — HEPARIN SODIUM (PORCINE) 1000 UNIT/ML IJ SOLN
INTRAMUSCULAR | Status: AC
  Filled 2020-03-11: qty 2

## 2020-03-11 MED ORDER — HEMOSTATIC AGENTS (NO CHARGE) OPTIME
TOPICAL | Status: DC | PRN
  Administered 2020-03-11: 1 via TOPICAL

## 2020-03-11 MED ORDER — LACTATED RINGERS IV SOLN: INTRAVENOUS | Status: DC | PRN

## 2020-03-11 MED ORDER — PROPOFOL 10 MG/ML IV BOLUS
INTRAVENOUS | Status: DC | PRN
  Administered 2020-03-11: 200 mg via INTRAVENOUS

## 2020-03-11 MED ORDER — LACTATED RINGERS IV SOLN: INTRAVENOUS | Status: DC

## 2020-03-11 MED ORDER — PHENYLEPHRINE HCL-NACL 10-0.9 MG/250ML-% IV SOLN
INTRAVENOUS | Status: DC | PRN
  Administered 2020-03-11: 50 ug/min via INTRAVENOUS

## 2020-03-11 MED ORDER — ROCURONIUM BROMIDE 100 MG/10ML IV SOLN
INTRAVENOUS | Status: DC | PRN
  Administered 2020-03-11: 50 mg via INTRAVENOUS

## 2020-03-11 MED ORDER — DOCUSATE SODIUM 100 MG PO CAPS: 100.0000 mg | ORAL_CAPSULE | Freq: Every day | ORAL | Status: DC

## 2020-03-11 MED ORDER — BISACODYL 10 MG RE SUPP: 10.0000 mg | Freq: Every day | RECTAL | Status: DC | PRN

## 2020-03-11 MED ORDER — LIDOCAINE HCL (CARDIAC) PF 100 MG/5ML IV SOSY
PREFILLED_SYRINGE | INTRAVENOUS | Status: DC | PRN
  Administered 2020-03-11: 60 mg via INTRATRACHEAL

## 2020-03-11 MED ORDER — HYDRALAZINE HCL 20 MG/ML IJ SOLN
5.0000 mg | INTRAMUSCULAR | Status: AC | PRN
  Administered 2020-03-12 (×2): 5 mg via INTRAVENOUS
  Filled 2020-03-11 (×2): qty 1

## 2020-03-11 MED ORDER — ONDANSETRON HCL 4 MG/2ML IJ SOLN: 4.0000 mg | Freq: Once | INTRAMUSCULAR | Status: DC | PRN

## 2020-03-11 MED ORDER — HEPARIN SODIUM (PORCINE) 1000 UNIT/ML IJ SOLN
INTRAMUSCULAR | Status: DC | PRN
  Administered 2020-03-11: 9000 [IU] via INTRAVENOUS
  Administered 2020-03-11: 2000 [IU] via INTRAVENOUS

## 2020-03-11 MED ORDER — ALBUTEROL SULFATE (2.5 MG/3ML) 0.083% IN NEBU: 2.5000 mg | INHALATION_SOLUTION | RESPIRATORY_TRACT | Status: DC | PRN

## 2020-03-11 MED ORDER — ALBUMIN HUMAN 5 % IV SOLN
12.5000 g | Freq: Once | INTRAVENOUS | Status: AC
  Administered 2020-03-11: 12.5 g via INTRAVENOUS

## 2020-03-11 MED ORDER — ORAL CARE MOUTH RINSE: 15.0000 mL | Freq: Once | OROMUCOSAL | Status: AC

## 2020-03-11 MED ORDER — SODIUM CHLORIDE 0.9 % IV SOLN: INTRAVENOUS | Status: DC

## 2020-03-11 MED ORDER — PANTOPRAZOLE SODIUM 40 MG PO TBEC: 40.0000 mg | DELAYED_RELEASE_TABLET | Freq: Every day | ORAL | Status: DC

## 2020-03-11 MED ORDER — LABETALOL HCL 5 MG/ML IV SOLN
10.0000 mg | INTRAVENOUS | Status: AC | PRN
  Administered 2020-03-12 – 2020-03-17 (×4): 10 mg via INTRAVENOUS
  Filled 2020-03-11 (×3): qty 4

## 2020-03-11 MED ORDER — PHENYLEPHRINE 40 MCG/ML (10ML) SYRINGE FOR IV PUSH (FOR BLOOD PRESSURE SUPPORT)
PREFILLED_SYRINGE | INTRAVENOUS | Status: DC | PRN
  Administered 2020-03-11 (×3): 40 ug via INTRAVENOUS
  Administered 2020-03-11 (×2): 80 ug via INTRAVENOUS
  Administered 2020-03-11 (×2): 40 ug via INTRAVENOUS

## 2020-03-11 MED ORDER — CEFAZOLIN SODIUM-DEXTROSE 2-3 GM-%(50ML) IV SOLR
INTRAVENOUS | Status: DC | PRN
  Administered 2020-03-11: 2 g via INTRAVENOUS

## 2020-03-11 MED ORDER — PHENOL 1.4 % MT LIQD: 1.0000 | OROMUCOSAL | Status: DC | PRN

## 2020-03-11 MED ORDER — CEFAZOLIN SODIUM-DEXTROSE 2-4 GM/100ML-% IV SOLN
2.0000 g | Freq: Three times a day (TID) | INTRAVENOUS | Status: AC
  Administered 2020-03-11 – 2020-03-12 (×2): 2 g via INTRAVENOUS
  Filled 2020-03-11 (×2): qty 100

## 2020-03-11 MED ORDER — PROPOFOL 10 MG/ML IV BOLUS
INTRAVENOUS | Status: AC
  Filled 2020-03-11: qty 20

## 2020-03-11 MED ORDER — MORPHINE SULFATE (PF) 2 MG/ML IV SOLN
2.0000 mg | INTRAVENOUS | Status: DC | PRN
  Administered 2020-03-12 – 2020-03-13 (×6): 2 mg via INTRAVENOUS
  Filled 2020-03-11 (×6): qty 1

## 2020-03-11 MED ORDER — SODIUM CHLORIDE 0.9 % IV SOLN
0.0125 ug/kg/min | INTRAVENOUS | Status: AC
  Administered 2020-03-11: .1 ug/kg/min via INTRAVENOUS
  Filled 2020-03-11: qty 2000

## 2020-03-11 MED ORDER — HYDROMORPHONE HCL 1 MG/ML IJ SOLN: 0.2500 mg | INTRAMUSCULAR | Status: DC | PRN

## 2020-03-11 MED ORDER — MAGNESIUM SULFATE 2 GM/50ML IV SOLN: 2.0000 g | Freq: Every day | INTRAVENOUS | Status: DC | PRN

## 2020-03-11 MED ORDER — OXYCODONE-ACETAMINOPHEN 5-325 MG PO TABS
1.0000 | ORAL_TABLET | ORAL | Status: DC | PRN
  Administered 2020-03-11: 1 via ORAL
  Filled 2020-03-11: qty 1

## 2020-03-11 MED ORDER — CHLORHEXIDINE GLUCONATE 0.12 % MT SOLN: 15.0000 mL | Freq: Once | OROMUCOSAL | Status: AC

## 2020-03-11 MED ORDER — FENTANYL CITRATE (PF) 250 MCG/5ML IJ SOLN
INTRAMUSCULAR | Status: DC | PRN
  Administered 2020-03-11: 200 ug via INTRAVENOUS

## 2020-03-11 MED ORDER — CHLORHEXIDINE GLUCONATE 0.12 % MT SOLN: 15.0000 mL | Freq: Once | OROMUCOSAL | Status: DC

## 2020-03-11 MED ORDER — SUCCINYLCHOLINE CHLORIDE 20 MG/ML IJ SOLN
INTRAMUSCULAR | Status: DC | PRN
  Administered 2020-03-11: 140 mg via INTRAVENOUS

## 2020-03-11 MED ORDER — 0.9 % SODIUM CHLORIDE (POUR BTL) OPTIME
TOPICAL | Status: DC | PRN
  Administered 2020-03-11: 2000 mL

## 2020-03-11 MED ORDER — ONDANSETRON HCL 4 MG/2ML IJ SOLN
4.0000 mg | Freq: Four times a day (QID) | INTRAMUSCULAR | Status: DC | PRN
  Administered 2020-03-12: 4 mg via INTRAVENOUS
  Filled 2020-03-11: qty 2

## 2020-03-11 MED ORDER — POTASSIUM CHLORIDE CRYS ER 20 MEQ PO TBCR: 20.0000 meq | EXTENDED_RELEASE_TABLET | Freq: Every day | ORAL | Status: DC | PRN

## 2020-03-11 MED ORDER — PROTAMINE SULFATE 10 MG/ML IV SOLN
INTRAVENOUS | Status: DC | PRN
  Administered 2020-03-11: 50 mg via INTRAVENOUS

## 2020-03-11 MED ORDER — ONDANSETRON HCL 4 MG/2ML IJ SOLN
INTRAMUSCULAR | Status: DC | PRN
  Administered 2020-03-11: 4 mg via INTRAVENOUS

## 2020-03-11 MED ORDER — DEXAMETHASONE SODIUM PHOSPHATE 10 MG/ML IJ SOLN
INTRAMUSCULAR | Status: DC | PRN
  Administered 2020-03-11: 8 mg via INTRAVENOUS

## 2020-03-11 MED ORDER — FENTANYL CITRATE (PF) 250 MCG/5ML IJ SOLN
INTRAMUSCULAR | Status: AC
  Filled 2020-03-11: qty 5

## 2020-03-11 MED ORDER — LABETALOL HCL 5 MG/ML IV SOLN
INTRAVENOUS | Status: DC | PRN
  Administered 2020-03-11: 5 mg via INTRAVENOUS

## 2020-03-11 MED ORDER — SUGAMMADEX SODIUM 200 MG/2ML IV SOLN
INTRAVENOUS | Status: DC | PRN
  Administered 2020-03-11: 200 mg via INTRAVENOUS
  Administered 2020-03-11: 50 mg via INTRAVENOUS

## 2020-03-11 MED ORDER — METOPROLOL TARTRATE 5 MG/5ML IV SOLN: 2.0000 mg | INTRAVENOUS | Status: DC | PRN

## 2020-03-11 MED ORDER — SODIUM CHLORIDE 0.9 % IV SOLN
INTRAVENOUS | Status: AC
  Filled 2020-03-11: qty 1.2

## 2020-03-11 MED ORDER — ALBUMIN HUMAN 5 % IV SOLN
INTRAVENOUS | Status: AC
  Filled 2020-03-11: qty 250

## 2020-03-11 MED ORDER — GUAIFENESIN-DM 100-10 MG/5ML PO SYRP: 15.0000 mL | ORAL_SOLUTION | ORAL | Status: DC | PRN

## 2020-03-11 MED ORDER — SODIUM CHLORIDE 0.9 % IV SOLN: 500.0000 mL | Freq: Once | INTRAVENOUS | Status: DC | PRN

## 2020-03-11 MED ORDER — GLYCOPYRROLATE 0.2 MG/ML IJ SOLN
INTRAMUSCULAR | Status: DC | PRN
  Administered 2020-03-11: .2 mg via INTRAVENOUS

## 2020-03-11 MED ORDER — CEFAZOLIN SODIUM-DEXTROSE 2-4 GM/100ML-% IV SOLN
INTRAVENOUS | Status: AC
  Filled 2020-03-11: qty 100

## 2020-03-11 MED ORDER — SODIUM CHLORIDE 0.9 % IV SOLN
INTRAVENOUS | Status: DC | PRN
  Administered 2020-03-11: 500 mL

## 2020-03-11 MED ORDER — CHLORHEXIDINE GLUCONATE 0.12 % MT SOLN
OROMUCOSAL | Status: AC
  Administered 2020-03-11: 15 mL via OROMUCOSAL
  Filled 2020-03-11: qty 15

## 2020-03-11 SURGICAL SUPPLY — 51 items
CANISTER SUCT 3000ML PPV (MISCELLANEOUS) ×3 IMPLANT
CATH ROBINSON RED A/P 18FR (CATHETERS) ×3 IMPLANT
CLIP VESOCCLUDE MED 24/CT (CLIP) ×3 IMPLANT
CLIP VESOCCLUDE SM WIDE 24/CT (CLIP) ×3 IMPLANT
COVER PROBE W GEL 5X96 (DRAPES) IMPLANT
COVER TRANSDUCER ULTRASND GEL (DISPOSABLE) ×3 IMPLANT
COVER WAND RF STERILE (DRAPES) IMPLANT
DERMABOND ADVANCED (GAUZE/BANDAGES/DRESSINGS) ×2
DERMABOND ADVANCED .7 DNX12 (GAUZE/BANDAGES/DRESSINGS) ×1 IMPLANT
DRAIN CHANNEL 15F RND FF W/TCR (WOUND CARE) IMPLANT
ELECT REM PT RETURN 9FT ADLT (ELECTROSURGICAL) ×3
ELECTRODE REM PT RTRN 9FT ADLT (ELECTROSURGICAL) ×1 IMPLANT
EVACUATOR SILICONE 100CC (DRAIN) IMPLANT
GLOVE BIO SURGEON STRL SZ7.5 (GLOVE) ×3 IMPLANT
GLOVE BIOGEL PI IND STRL 8 (GLOVE) ×1 IMPLANT
GLOVE BIOGEL PI INDICATOR 8 (GLOVE) ×2
GOWN STRL REUS W/ TWL LRG LVL3 (GOWN DISPOSABLE) ×2 IMPLANT
GOWN STRL REUS W/ TWL XL LVL3 (GOWN DISPOSABLE) ×2 IMPLANT
GOWN STRL REUS W/TWL LRG LVL3 (GOWN DISPOSABLE) ×4
GOWN STRL REUS W/TWL XL LVL3 (GOWN DISPOSABLE) ×4
HEMOSTAT SNOW SURGICEL 2X4 (HEMOSTASIS) ×3 IMPLANT
IV ADAPTER SYR DOUBLE MALE LL (MISCELLANEOUS) IMPLANT
KIT BASIN OR (CUSTOM PROCEDURE TRAY) ×3 IMPLANT
KIT SHUNT ARGYLE CAROTID ART 6 (VASCULAR PRODUCTS) IMPLANT
KIT TURNOVER KIT B (KITS) ×3 IMPLANT
LOOP VESSEL MINI RED (MISCELLANEOUS) IMPLANT
NEEDLE HYPO 25GX1X1/2 BEV (NEEDLE) IMPLANT
NEEDLE SPNL 20GX3.5 QUINCKE YW (NEEDLE) IMPLANT
NS IRRIG 1000ML POUR BTL (IV SOLUTION) ×9 IMPLANT
PACK CAROTID (CUSTOM PROCEDURE TRAY) ×3 IMPLANT
PAD ARMBOARD 7.5X6 YLW CONV (MISCELLANEOUS) ×6 IMPLANT
PATCH VASC XENOSURE 1CMX6CM (Vascular Products) ×2 IMPLANT
PATCH VASC XENOSURE 1X6 (Vascular Products) ×1 IMPLANT
POSITIONER HEAD DONUT 9IN (MISCELLANEOUS) ×3 IMPLANT
SHUNT CAROTID BYPASS 10 (VASCULAR PRODUCTS) ×3 IMPLANT
SHUNT CAROTID BYPASS 12FRX15.5 (VASCULAR PRODUCTS) IMPLANT
SPONGE SURGIFOAM ABS GEL 100 (HEMOSTASIS) IMPLANT
STOPCOCK 4 WAY LG BORE MALE ST (IV SETS) IMPLANT
SUT ETHILON 3 0 PS 1 (SUTURE) IMPLANT
SUT MNCRL AB 4-0 PS2 18 (SUTURE) ×3 IMPLANT
SUT PROLENE 5 0 C 1 24 (SUTURE) ×6 IMPLANT
SUT PROLENE 6 0 BV (SUTURE) ×18 IMPLANT
SUT PROLENE BLUE 7 0 (SUTURE) ×3 IMPLANT
SUT SILK 3 0 (SUTURE)
SUT SILK 3-0 18XBRD TIE 12 (SUTURE) IMPLANT
SUT VIC AB 3-0 SH 27 (SUTURE) ×2
SUT VIC AB 3-0 SH 27X BRD (SUTURE) ×1 IMPLANT
SYR CONTROL 10ML LL (SYRINGE) IMPLANT
TOWEL GREEN STERILE (TOWEL DISPOSABLE) ×3 IMPLANT
TUBING ART PRESS 48 MALE/FEM (TUBING) IMPLANT
WATER STERILE IRR 1000ML POUR (IV SOLUTION) ×3 IMPLANT

## 2020-03-11 NOTE — Progress Notes (Signed)
Pt transported off unit to OR for procedure. Report called off to short stay RN. Delia Heady RN

## 2020-03-11 NOTE — Anesthesia Procedure Notes (Signed)
Procedure Name: Intubation Date/Time: 03/11/2020 12:43 PM Performed by: Mariea Clonts, CRNA Pre-anesthesia Checklist: Patient identified, Emergency Drugs available, Suction available and Patient being monitored Patient Re-evaluated:Patient Re-evaluated prior to induction Oxygen Delivery Method: Circle System Utilized Preoxygenation: Pre-oxygenation with 100% oxygen Induction Type: IV induction Ventilation: Mask ventilation without difficulty Laryngoscope Size: Mac and 3 Grade View: Grade I Tube type: Oral Tube size: 7.5 mm Number of attempts: 1 Airway Equipment and Method: Stylet and Oral airway Placement Confirmation: ETT inserted through vocal cords under direct vision,  positive ETCO2 and breath sounds checked- equal and bilateral Tube secured with: Tape Dental Injury: Teeth and Oropharynx as per pre-operative assessment

## 2020-03-11 NOTE — Progress Notes (Signed)
Per Dr. Carlis Abbott, lovenox to be rescheduled until 0800. Moved to 0800 on [facility]

## 2020-03-11 NOTE — Anesthesia Postprocedure Evaluation (Signed)
Anesthesia Post Note  Patient: [name] [name]  Procedure(s) Performed: ENDARTERECTOMY CAROTID LEFT (Left Neck) PATCH ANGIOPLASTY WITH XENOSURE BIOLOGIC PATCH (Left Neck)     Patient location during evaluation: PACU Anesthesia Type: General Level of consciousness: awake Pain management: pain level controlled Vital Signs Assessment: post-procedure vital signs reviewed and stable Respiratory status: spontaneous breathing Cardiovascular status: stable Postop Assessment: no apparent nausea or vomiting Anesthetic complications: no   No complications documented.  Last Vitals:  Vitals:   03/11/20 1520 03/11/20 1535  BP: 100/76 (!) 96/42  Pulse: 76 69  Resp: 14 18  Temp: 36.7 C   SpO2: 92% 95%    Last Pain:  Vitals:   03/11/20 1520  TempSrc:   PainSc: 0-No pain                 Jeri Jeanbaptiste

## 2020-03-11 NOTE — Anesthesia Procedure Notes (Signed)
Arterial Line Insertion Start/End9/04/2020 12:10 PM Performed by: Janace Litten, CRNA, CRNA  Patient location: Pre-op. Preanesthetic checklist: patient identified, IV checked, risks and benefits discussed, surgical consent and monitors and equipment checked Lidocaine 1% used for infiltration Right, radial was placed Catheter size: 20 G Hand hygiene performed  and maximum sterile barriers used   Attempts: 1 Procedure performed without using ultrasound guided technique. Following insertion, Biopatch and dressing applied. Post procedure assessment: normal  Patient tolerated the procedure well with no immediate complications.

## 2020-03-11 NOTE — Plan of Care (Signed)
  Problem: Health Behavior/Discharge Planning: Goal: Ability to manage health-related needs will improve Outcome: Progressing   

## 2020-03-11 NOTE — Transfer of Care (Signed)
Immediate Anesthesia Transfer of Care Note  Patient: [name] [name]  Procedure(s) Performed: ENDARTERECTOMY CAROTID LEFT (Left Neck) PATCH ANGIOPLASTY WITH XENOSURE BIOLOGIC PATCH (Left Neck)  Patient Location: PACU  Anesthesia Type:General  Level of Consciousness: awake  Airway & Oxygen Therapy: Patient Spontanous Breathing and Patient connected to nasal cannula oxygen  Post-op Assessment: Report given to RN and Post -op Vital signs reviewed and stable  Post vital signs: Reviewed  Last Vitals:  Vitals Value Taken Time  BP 104/52 03/11/20 1524  Temp    Pulse 70 03/11/20 1528  Resp 14 03/11/20 1528  SpO2 94 % 03/11/20 1528  Vitals shown include unvalidated device data.  Last Pain:  Vitals:   03/11/20 1520  TempSrc:   PainSc: (P) 0-No pain      Patients Stated Pain Goal: 2 (29/29/09 0301)  Complications: No complications documented. pt moving extremities x4.

## 2020-03-11 NOTE — Progress Notes (Signed)
Pt received from PACU. VSS. Neuro intact. VSS. Patient oriented to room and unit. Will continue to monitor.  Clyde Canterbury, RN

## 2020-03-11 NOTE — Progress Notes (Signed)
PT Cancellation Note  Patient Details Name: [name] [name] MRN: [id number] DOB: Nov 02, 1945   Cancelled Treatment:    Reason Eval/Treat Not Completed: Patient at procedure or test/unavailable. Per chart review pt currently off unit for CEA with vascular surgery. Will continue to follow.   Thelma Comp 03/11/2020, 11:36 AM   Rolinda Roan, PT, DPT Acute Rehabilitation Services Pager: [phone] Office: [phone]

## 2020-03-11 NOTE — Op Note (Signed)
OPERATIVE NOTE  PROCEDURE:   1.  left carotid endarterectomy with bovine patch angioplasty 2.  left intraoperative carotid ultrasound  PRE-OPERATIVE DIAGNOSIS: left symptomatic high grade carotid stenosis >80%  POST-OPERATIVE DIAGNOSIS: same as above   SURGEON: Marty Heck, MD  ASSISTANT(S): Risa Grill, PA  ANESTHESIA: general  ESTIMATED BLOOD LOSS: <75 cc  FINDING(S): 1.  No evidence of intimal flap visualized on transverse or longitudinal ultrasonography. 2.  Continuous Doppler audible flow signatures are  appropriate for each carotid artery after endarterectomy with patch angioplasty.  SPECIMEN(S):  Carotid plaque (sent to Pathology)  INDICATIONS:   [name] [name] is a 74 y.o. male who presents with left symptomatic high grade carotid stenosis >80%.  I discussed with the patient the risks, benefits, and alternatives to carotid endarterectomy.  I discussed the procedural details of carotid endarterectomy with the patient.  The patient is aware that the risks of carotid endarterectomy include but are not limited to: bleeding, infection, stroke, myocardial infarction, death, cranial nerve injuries both temporary and permanent, neck hematoma, possible airway compromise, labile blood pressure post-operatively, cerebral hyperperfusion syndrome, and possible need for additional interventions in the future. The patient is aware of the risks and agrees to proceed forward with the procedure.  DESCRIPTION: After full informed written consent was obtained from the patient, the patient was brought back to the operating room and placed supine upon the operating table.  Prior to induction, the patient received IV antibiotics.  After obtaining adequate anesthesia, the patient was placed into semi-Fowler position with a shoulder roll in place and the patient's neck slightly hyperextended and rotated away from the surgical site.  The patient was prepped in the standard fashion for a  left carotid endarterectomy.  I made an incision anterior to the sternocleidomastoid muscle and dissected down through the subcutaneous tissue.  The platysma was opened with electrocautery.  I then used Bovie cautery and blunt dissection to dissect through the underlying platysma and to mobilize the anterior border of the sternocleidomastoid as well as the internal jugular vein laterally.  The facial vein was ligated with 3-0 silk and surgical clips and divided.  After identifying the carotid artery I used Metzenbaum scissors to bluntly dissect the common carotid artery and then controlled this with both a large vessel loop and a umbilical tape.  At this point in time the patient was given 100 units/kg of IV heparin and we checked an ACT to ensure it was greater than 250.  I then carried my dissection cephalad and mobilized the external carotid artery and superior thyroid artery and controlled each of these with a vessel loop.  I then dissected out the internal carotid artery well past the distal plaque.  The internal carotid artery was then controlled with a Vesseloop as well. I was careful to identify the vagus nerve between the internal jugular and common carotid and this was presereved.  I was also careful to identify and preserve the hypoglossal nerve and this was preserved.    Once our ACT was confirmed, I proceeded by clamping the internal carotid artery with a angled bulldog clamp first.  The proximal common carotid artery was controlled with a angled debakey clamp.  The external carotid was controlled with a vessel loop.  I subsequently opened the common carotid artery with an 11 blade scalpel in longitudinal fashion and extended the arteriotomy with Potts scissors onto the ICA past the distal plaque.   I then used a Garment/textile technologist and performed a  endarterectomy starting in the common carotid artery.  The external carotid artery was endarterectomized with an eversion technique and I was careful to  feather the distal ICA plaque.  The specimen was passed off the field.  The endarterectomy site was then flushed with heparinized saline and I was careful to ensure there were no flaps in the endarterectomy site.   At this point a 10 [place] shunt was brought to the field and then initially placed distally into the ICA after removing the clamp and controlled with a vessel loop. The shunt was back bleed with good flow.  I then placed the proximal end of the shunt in the common carotid artery and controlled this with a Rummel tourniquet. I then brought a bovine carotid patch on the field and this was sewn in place with a running anastomosis using a 6-0 Prolene distally and a 5-0 proximal.  The bovine patch was trimmed accordingly.  The shunt was removed just before completion of the patch.  The artery was flushed antegrade and retrograde prior to completion of the patch.  Once the patch was complete, I flushed up the external carotid artery first prior to releasing the internal carotid artery clamp.  An intraoperative duplex was performed that showed no evidence of any flaps.  Doppler also confirmed good flow in the ICA and ECA.  Once I was happy with the intraoperative ultrasound the patient was given protamine for reversal.  I used surgicel snow to get hemostasis around the patch.  Ultimately the platysma was closed in running fashion with 3-0 Vicryl.  The skin was closed with a running 4-0 Monocryl.  Dermabond was applied with a dry sterile dressing.  The patient was awakened from anesthesia with no new neurological deficit and taken to PACU in stable condition.    COMPLICATIONS: None  CONDITION: Stable  Marty Heck, MD [facility]: [phone]  03/11/2020, 2:52 PM

## 2020-03-11 NOTE — TOC Progression Note (Signed)
Transition of Care [facility]) - Progression Note    Patient Details  Name: [name] [name] MRN: [id number] Date of Birth: 1946-04-14  Transition of Care [facility]) CM/SW Contact  Pollie Friar, RN Phone Number: 03/11/2020, 11:17 AM  Clinical Narrative:    CM has faxed orders for outpatient therapy to [facility] [facility].  ToC following for further d/c needs.    Expected Discharge Plan: OP Rehab Barriers to Discharge: Continued Medical Work up  Expected Discharge Plan and Services Expected Discharge Plan: OP Rehab   Discharge Planning Services: CM Consult   Living arrangements for the past 2 months: Single Family Home                                       Social Determinants of Health (SDOH) Interventions    Readmission Risk Interventions No flowsheet data found.

## 2020-03-11 NOTE — Progress Notes (Signed)
PROGRESS NOTE    [name] [name]  TWS:[id number] DOB: 09/07/1945 DOA: 03/08/2020 PCP: [name] Flow, MD   Chief Complaint  Patient presents with   Hypoglycemia   Fall    Brief Narrative:  [name] [name] is [name] [name] 74 y.o. male with medical history significant for memory loss, chronic kidney disease, type 2 diabetes mellitus, AAA, hypertension, and hyperlipidemia, now presenting to the emergency department for evaluation of confusion, hypoglycemia, and [name] [name] fall.  MRI showed stroke involving the L frontal and ocipital lobes.  He was noted to have L carotid artery disease and vascular surgery was c/s for CEA.  S/p CEA on 9/10.  Discharge pending clearance by vascular.  Will need outpatient PT and 3 in 1 for DME.   Assessment & Plan:   Principal Problem:   Acute ischemic stroke [facility]) Active Problems:   Diabetes mellitus type II, non insulin dependent ([place])   Memory loss   Renal insufficiency   Hypertension   Stroke ([place])  1. Acute ischemic CVA  - Presents with speech difficulty, confusion, and [name] [name] fall and is found to have acute ischemic infarctions on MRI involving left frontal and occipital lobes  - CTA head/neck with extensive atheromatous disease of the aorta and branch vessels.  30% luminal narrowing of innominate 2/2 atheromatous plaque.  50% narrowing of L carotid bulb and 80-90% narrowing of proximal L ICA 2/2 atheromaous plaque.  Mild R V1 and V4 segment narrowing 2/2 atheromatous diseae.  Mild narrowing of proximal R petrous and L paraclinoid ICA. - carotid US notable for L ICA velocities consistent with 60-79% stenosis, PSV at bulb suggest 80-99% stenosis.  - Neurology, recommending DAPT, crestor increased to 40 mg, vascular surgery consult -> he's now s/p L CEA on 9/10 by vascular - echo with EF 55-60%, no RWMA (see report) - Continue cardiac monitoring, frequent neuro checks  - PT/OT/SLP eval -> OT recommending outpatient OT, 24 hr supervision/assistance, 3 in 1 bedside commode.   Outpatient SLP.  No PT follow up.   - LDL 95, a1c 6.8   # Carotid Artery Stenosis: s/p L CEA 9/10  2. Type II DM  - on glimepiride at home, currently on hold - A1c 6.8 - continue SSI   3. Renal insufficiency  - SCr is 1.46 on admission  - creatinine 1.2 today, continue to follow - He has CKD on problem list but most recent prior labs available are from 2014 when SCr was 0.9   - Renally-dose medications, monitor    4. Hypertension  - BP normal in ED, hold antihypertensives for now in setting of acute ischemic CVA.  Will gradually resume in coming days.  Per neurology, will defer to vascular surgery post CEA goals.   5. Hyperlipidemia  - Continue Crestor, check fasting lipids    6. Memory loss   Etoh Abuse:  - Followed by neurology, treated with Aricept and Namenda  - follow thiamine level (elevated) - on high dose thiamine x 3 days, then 100 mg daily - follow B12 (low normal, follow MMA) - continue folate supplementation - CIWA  7. OSA  - Patient reports CPAP intolerance   # Cough: x2-3 weeks, follow CXR - follow outpatient - CXR with mild peribronchial thickening, consider discharge with inhaler  DVT prophylaxis: lovenox Code Status: full  Family Communication: none at bedside Disposition:   Status is: Inpatient  The patient will require care spanning > 2 midnights and should be moved to inpatient because: Inpatient level of care appropriate  due to severity of illness  Dispo: The patient is from: Home              Anticipated d/c is to: Home              Anticipated d/c date is: > 3 days              Patient currently is not medically stable to d/c.       Consultants:   Neurology  Vascular surgery  Procedures:  Echo IMPRESSIONS    1. Left ventricular ejection fraction, by estimation, is 55 to 60%. The  left ventricle has normal function. The left ventricle has no regional  wall motion abnormalities. Left ventricular diastolic parameters were    normal.  2. Right ventricular systolic function is normal. The right ventricular  size is normal.  3. Left atrial size was mildly dilated.  4. The mitral valve is normal in structure. Mild mitral valve  regurgitation. No evidence of mitral stenosis.  5. The aortic valve is tricuspid. Aortic valve regurgitation is mild.  Mild aortic valve sclerosis is present, with no evidence of aortic valve  stenosis.  6. The inferior vena cava is normal in size with greater than 50%  respiratory variability, suggesting right atrial pressure of 3 mmHg.   Carotid US Summary:  Right Carotid: The extracranial vessels were near-normal with only minimal  wall         thickening or plaque.   Left Carotid: Velocities in the left ICA are consistent with [name] [name] 60-79%  stenosis.        PSV at the bulb level suggest 80-99% stenosis.     *See table(s) above for measurements and observations.    Antimicrobials Anti-infectives (From admission, onward)   None        Subjective: [name] [name]&Ox3 No new complaints  Objective: Vitals:   03/10/20 2340 03/11/20 0410 03/11/20 0753 03/11/20 1520  BP: (!) 161/84 132/65 (!) 151/70 (P) 100/76  Pulse: (!) 56 62 (!) 52   Resp: 17 17 17    Temp: 98.3 F (36.8 C) 98.2 F (36.8 C) 98.2 F (36.8 C) (P) 98 F (36.7 C)  TempSrc: Oral Oral Oral   SpO2: 96% 95% 97%   Weight:      Height:        Intake/Output Summary (Last 24 hours) at 03/11/2020 1527 Last data filed at 03/11/2020 1526 Gross per 24 hour  Intake 2056.95 ml  Output 900 ml  Net 1156.95 ml   Filed Weights   03/09/20 0201  Weight: 84.1 kg    Examination:  General: No acute distress. Cardiovascular: Heart sounds show [name] [name] regular rate, and rhythm Lungs: Clear to auscultation bilaterally  Abdomen: Soft, nontender, nondistended  Neurological: Alert and oriented 3. Moves all extremities 4. Cranial nerves II through XII grossly intact. Skin: Warm and dry. No rashes or  lesions. Extremities: No clubbing or cyanosis. No edema.   Data Reviewed: I have personally reviewed following labs and imaging studies  CBC: Recent Labs  Lab 03/08/20 1613 03/09/20 1000 03/10/20 0423 03/11/20 0139  WBC 12.7* 9.0 8.5 7.1  NEUTROABS 10.6*  --  5.8 3.9  HGB 14.0 12.1* 12.4* 12.4*  HCT 41.7 36.7* 37.6* 36.5*  MCV 91.0 90.2 91.9 91.0  PLT 187 155 148* 983    Basic Metabolic Panel: Recent Labs  Lab 03/08/20 1613 03/09/20 0338 03/09/20 1000 03/10/20 0423 03/11/20 0139  NA 136  --  138 140 140  K 4.2  --  3.4* 3.4* 3.8  CL 102  --  105 107 105  CO2 24  --  23 24 24   GLUCOSE 191*  --  203* 106* 90  BUN 20  --  14 12 13   CREATININE 1.46*  --  1.33* 1.20 1.19  CALCIUM 9.7  --  9.1 9.3 9.6  MG  --  2.2  --  2.0 2.0  PHOS  --   --   --  2.7 3.4    GFR: Estimated Creatinine Clearance: 54.5 mL/min (by C-G formula based on SCr of 1.19 mg/dL).  Liver Function Tests: Recent Labs  Lab 03/08/20 1613 03/09/20 1000 03/10/20 0423 03/11/20 0139  AST 39 34 31 26  ALT 30 29 33 33  ALKPHOS 120 101 98 116  BILITOT 0.7 1.1 1.0 0.9  PROT 6.7 5.9* 6.3* 6.1*  ALBUMIN 3.9 3.3* 3.4* 3.1*    CBG: Recent Labs  Lab 03/10/20 1611 03/10/20 2111 03/11/20 0624 03/11/20 1047 03/11/20 1520  GLUCAP 139* 148* 108* 135* 126*     Recent Results (from the past 240 hour(s))  SARS Coronavirus 2 by RT PCR (hospital order, performed in [facility] hospital lab) Nasopharyngeal Nasopharyngeal Swab     Status: None   Collection Time: 03/08/20  5:03 PM   Specimen: Nasopharyngeal Swab  Result Value Ref Range Status   SARS Coronavirus 2 NEGATIVE NEGATIVE Final    Comment: (NOTE) SARS-CoV-2 target nucleic acids are NOT DETECTED.  The SARS-CoV-2 RNA is generally detectable in upper and lower respiratory specimens during the acute phase of infection. The lowest concentration of SARS-CoV-2 viral copies this assay can detect is 250 copies / mL. [name] [name] negative result does not preclude  SARS-CoV-2 infection and should not be used as the sole basis for treatment or other patient management decisions.  [name] [name] negative result may occur with improper specimen collection / handling, submission of specimen other than nasopharyngeal swab, presence of viral mutation(s) within the areas targeted by this assay, and inadequate number of viral copies (<250 copies / mL). [name] [name] negative result must be combined with clinical observations, patient history, and epidemiological information.  Fact Sheet for Patients:   StrictlyIdeas.no  Fact Sheet for Healthcare Providers: BankingDealers.co.za  This test is not yet approved or  cleared by the [place] FDA and has been authorized for detection and/or diagnosis of SARS-CoV-2 by FDA under an Emergency Use Authorization (EUA).  This EUA will remain in effect (meaning this test can be used) for the duration of the COVID-19 declaration under Section 564(b)(1) of the Act, 21 U.S.C. section 360bbb-3(b)(1), unless the authorization is terminated or revoked sooner.  Performed at [facility], [place] [address], [place], [name] [name] [address]   Culture, Urine     Status: None   Collection Time: 03/08/20 10:21 PM   Specimen: Urine, Random  Result Value Ref Range Status   Specimen Description URINE, RANDOM  Final   Special Requests NONE  Final   Culture   Final    NO GROWTH Performed at [facility], [place] [address] [place], [place] [address]    Report Status 03/09/2020 FINAL  Final  MRSA PCR Screening     Status: None   Collection Time: 03/11/20  6:34 AM   Specimen: Nasopharyngeal  Result Value Ref Range Status   MRSA by PCR NEGATIVE NEGATIVE Final    Comment:        The GeneXpert MRSA Assay (FDA approved for NASAL specimens only), is one component  of [name] [name] comprehensive MRSA colonization surveillance program. It is not intended to diagnose MRSA infection nor to guide or monitor  treatment for MRSA infections. Performed at [facility], [place] [address] [place], [place] [address]          Radiology Studies: DG CHEST PORT 1 VIEW  Result Date: 03/10/2020 CLINICAL DATA:  Cough. EXAM: PORTABLE CHEST 1 VIEW COMPARISON:  Radiograph 12/05/2019 FINDINGS: Mild cardiomegaly, similar to prior. Unchanged mediastinal contours. Mild peribronchial thickening. No confluent airspace disease. No significant pleural effusion. No pneumothorax. No acute osseous abnormalities are seen. IMPRESSION: 1. Stable mild cardiomegaly. 2. Mild peribronchial thickening, can be seen with bronchitis or asthma. Electronically Signed   By: Keith Rake M.D.   On: 03/10/2020 17:31        Scheduled Meds:  [MAR Hold] aspirin  81 mg Oral Daily   [MAR Hold] Chlorhexidine Gluconate Cloth  6 each Topical Daily   [MAR Hold] citalopram  20 mg Oral Daily   [MAR Hold] clopidogrel  75 mg Oral Daily   [MAR Hold] donepezil  10 mg Oral QHS   [MAR Hold] enoxaparin (LOVENOX) injection  40 mg Subcutaneous QHS   [MAR Hold] folic acid  1 mg Oral Daily   [MAR Hold] insulin aspart  0-5 Units Subcutaneous QHS   [MAR Hold] insulin aspart  0-9 Units Subcutaneous TID WC   [MAR Hold] LORazepam  0-4 mg Intravenous Q12H   [MAR Hold] memantine  10 mg Oral BID   [MAR Hold] multivitamin with minerals  1 tablet Oral Daily   [MAR Hold] rosuvastatin  40 mg Oral q1800   [MAR Hold] thiamine injection  100 mg Intravenous Daily   [MAR Hold] vitamin B-12  1,000 mcg Oral Daily   Continuous Infusions:  [MAR Hold] sodium chloride Stopped (03/11/20 0532)   lactated ringers Stopped (03/11/20 1505)   [MAR Hold] thiamine injection 400 mg (03/11/20 0532)     LOS: 2 days    Time spent: over 30 min    Fayrene Helper, MD [facility]

## 2020-03-11 NOTE — Anesthesia Preprocedure Evaluation (Signed)
Anesthesia Evaluation  Patient identified by MRN, date of birth, ID band Patient awake    Reviewed: Allergy & Precautions, H&P , NPO status , Patient's Chart, lab work & pertinent test results, reviewed documented beta blocker date and time   History of Anesthesia Complications Negative for: history of anesthetic complications  Airway Mallampati: II  TM Distance: >3 FB Neck ROM: Full    Dental  (+) Teeth Intact, Dental Advisory Given   Pulmonary former smoker,    breath sounds clear to auscultation       Cardiovascular hypertension, Pt. on medications and Pt. on home beta blockers + Peripheral Vascular Disease   Rhythm:Regular Rate:Normal  Echo 03/09/2020 1. Left ventricular ejection fraction, by estimation, is 55 to 60%. The left ventricle has normal function. The left ventricle has no regional wall motion abnormalities. Left ventricular diastolic parameters were normal.  2. Right ventricular systolic function is normal. The right ventricular size is normal.  3. Left atrial size was mildly dilated.  4. The mitral valve is normal in structure. Mild mitral valve  regurgitation. No evidence of mitral stenosis.  5. The aortic valve is tricuspid. Aortic valve regurgitation is mild. Mild aortic valve sclerosis is present, with no evidence of aortic valve stenosis.  6. The inferior vena cava is normal in size with greater than 50% respiratory variability, suggesting right atrial pressure of 3 mmHg.    Neuro/Psych CVA negative psych ROS   GI/Hepatic Neg liver ROS, GERD  Medicated and Controlled,  Endo/Other  negative endocrine ROSdiabetes  Renal/GU Renal disease     Musculoskeletal  (+) Arthritis ,   Abdominal   Peds  Hematology negative hematology ROS (+)   Anesthesia Other Findings   Reproductive/Obstetrics                            Anesthesia Physical  Anesthesia Plan  ASA:  III  Anesthesia Plan: General   Post-op Pain Management:    Induction: Intravenous  PONV Risk Score and Plan: 3 and Ondansetron, Treatment may vary due to age or medical condition and Dexamethasone  Airway Management Planned: Oral ETT  Additional Equipment: Arterial line  Intra-op Plan:   Post-operative Plan: Extubation in OR  Informed Consent: I have reviewed the patients History and Physical, chart, labs and discussed the procedure including the risks, benefits and alternatives for the proposed anesthesia with the patient or authorized representative who has indicated his/her understanding and acceptance.     Dental advisory given  Plan Discussed with: CRNA and Anesthesiologist  Anesthesia Plan Comments: (Remi gtt)       Anesthesia Quick Evaluation

## 2020-03-11 NOTE — Progress Notes (Signed)
[facility]  Subjective  - no new neurologic events overnight.   Objective (!) 151/70 (!) 52 98.2 F (36.8 C) (Oral) 17 97%  Intake/Output Summary (Last 24 hours) at 03/11/2020 1158 Last data filed at 03/11/2020 0532 Gross per 24 hour  Intake 528.95 ml  Output 600 ml  Net -71.05 ml    Neuro intact, CN II-XII grossly intact.  Laboratory Lab Results: Recent Labs    03/10/20 0423 03/11/20 0139  WBC 8.5 7.1  HGB 12.4* 12.4*  HCT 37.6* 36.5*  PLT 148* 163   BMET Recent Labs    03/10/20 0423 03/11/20 0139  NA 140 140  K 3.4* 3.8  CL 107 105  CO2 24 24  GLUCOSE 106* 90  BUN 12 13  CREATININE 1.20 1.19  CALCIUM 9.3 9.6    COAG Lab Results  Component Value Date   INR 0.9 03/11/2020   INR 0.9 03/08/2020   INR 1.02 04/29/2013   No results found for: PTT  Assessment/Planning:  74 year old male that vascular surgery was consulted for a high-grade symptomatic left ICA stenosis.  Patient initially presented with slurred speech on Tuesday and has ultimately undergone stroke work-up and been evaluated by neurology.  MRI shows left hemispheric infarcts in the frontal and occipital lobe.  Neurology feels this is likely related to MCA distribution.  CTA neck does confirm a high-grade very short focal proximal left ICA stenosis greater than 80%. Duplex also confirms.  Plan left CEA and risks/benefits discussed.    Marty Heck 03/11/2020 11:58 AM --

## 2020-03-12 ENCOUNTER — Inpatient Hospital Stay (HOSPITAL_COMMUNITY): Payer: Medicare PPO | Admitting: Anesthesiology

## 2020-03-12 ENCOUNTER — Inpatient Hospital Stay (HOSPITAL_COMMUNITY): Payer: Medicare PPO

## 2020-03-12 ENCOUNTER — Encounter (HOSPITAL_COMMUNITY)
Admission: EM | Disposition: A | Discharge status: Home / Self Care | Payer: Self-pay | Source: Home / Self Care | Attending: Family Medicine

## 2020-03-12 DIAGNOSIS — J9601 Acute respiratory failure with hypoxia: Secondary | ICD-10-CM

## 2020-03-12 DIAGNOSIS — I97638 Postprocedural hematoma of a circulatory system organ or structure following other circulatory system procedure: Secondary | ICD-10-CM

## 2020-03-12 DIAGNOSIS — J9811 Atelectasis: Secondary | ICD-10-CM

## 2020-03-12 HISTORY — PX: ENDARTERECTOMY: SHX5162

## 2020-03-12 LAB — POCT I-STAT 7, (LYTES, BLD GAS, ICA,H+H)
Acid-base deficit: 3 mmol/L — ABNORMAL HIGH (ref 0.0–2.0)
Bicarbonate: 22.8 mmol/L (ref 20.0–28.0)
Calcium, Ion: 1.22 mmol/L (ref 1.15–1.40)
HCT: 28 % — ABNORMAL LOW (ref 39.0–52.0)
Hemoglobin: 9.5 g/dL — ABNORMAL LOW (ref 13.0–17.0)
O2 Saturation: 90 %
Potassium: 3.7 mmol/L (ref 3.5–5.1)
Sodium: 143 mmol/L (ref 135–145)
TCO2: 24 mmol/L (ref 22–32)
pCO2 arterial: 41.5 mmHg (ref 32.0–48.0)
pH, Arterial: 7.347 — ABNORMAL LOW (ref 7.350–7.450)
pO2, Arterial: 61 mmHg — ABNORMAL LOW (ref 83.0–108.0)

## 2020-03-12 LAB — BASIC METABOLIC PANEL
Anion gap: 10 (ref 5–15)
BUN: 12 mg/dL (ref 8–23)
CO2: 21 mmol/L — ABNORMAL LOW (ref 22–32)
Calcium: 8.8 mg/dL — ABNORMAL LOW (ref 8.9–10.3)
Chloride: 109 mmol/L (ref 98–111)
Creatinine, Ser: 1.24 mg/dL (ref 0.61–1.24)
GFR calc Af Amer: >60 mL/min (ref >60)
GFR calc non Af Amer: 57 mL/min — ABNORMAL LOW (ref >60)
Glucose, Bld: 145 mg/dL — ABNORMAL HIGH (ref 70–99)
Potassium: 4.1 mmol/L (ref 3.5–5.1)
Sodium: 140 mmol/L (ref 135–145)

## 2020-03-12 LAB — CBC
HCT: 32.2 % — ABNORMAL LOW (ref 39.0–52.0)
HCT: 30.7 % — ABNORMAL LOW (ref 39.0–52.0)
Hemoglobin: 10.6 g/dL — ABNORMAL LOW (ref 13.0–17.0)
Hemoglobin: 10 g/dL — ABNORMAL LOW (ref 13.0–17.0)
MCH: 29.9 pg (ref 26.0–34.0)
MCH: 30.8 pg (ref 26.0–34.0)
MCHC: 32.9 g/dL (ref 30.0–36.0)
MCHC: 32.6 g/dL (ref 30.0–36.0)
MCV: 91 fL (ref 80.0–100.0)
MCV: 94.5 fL (ref 80.0–100.0)
Platelets: 173 10*3/uL (ref 150–400)
Platelets: 201 10*3/uL (ref 150–400)
RBC: 3.54 MIL/uL — ABNORMAL LOW (ref 4.22–5.81)
RBC: 3.25 MIL/uL — ABNORMAL LOW (ref 4.22–5.81)
RDW: 13 % (ref 11.5–15.5)
RDW: 13.2 % (ref 11.5–15.5)
WBC: 9.4 10*3/uL (ref 4.0–10.5)
WBC: 12.7 10*3/uL — ABNORMAL HIGH (ref 4.0–10.5)
nRBC: 0 % (ref 0.0–0.2)
nRBC: 0 % (ref 0.0–0.2)

## 2020-03-12 LAB — GLUCOSE, CAPILLARY
Glucose-Capillary: 130 mg/dL — ABNORMAL HIGH (ref 70–99)
Glucose-Capillary: 129 mg/dL — ABNORMAL HIGH (ref 70–99)
Glucose-Capillary: 132 mg/dL — ABNORMAL HIGH (ref 70–99)
Glucose-Capillary: 131 mg/dL — ABNORMAL HIGH (ref 70–99)

## 2020-03-12 LAB — METHYLMALONIC ACID, SERUM: Methylmalonic Acid, Quantitative: 248 nmol/L (ref 0–378)

## 2020-03-12 IMAGING — DX DG CHEST 1V PORT
1 series · 1 of 1 positions shown · non-contrast
Comparison: [DATE] and earlier, including CT chest [DATE].

CLINICAL DATA: 74-year-old with acute LEFT frontal and LEFT
occipital stroke who is postop day 1 carotid endarterectomy.

EXAM:
PORTABLE CHEST 1 VIEW

[chest ap]
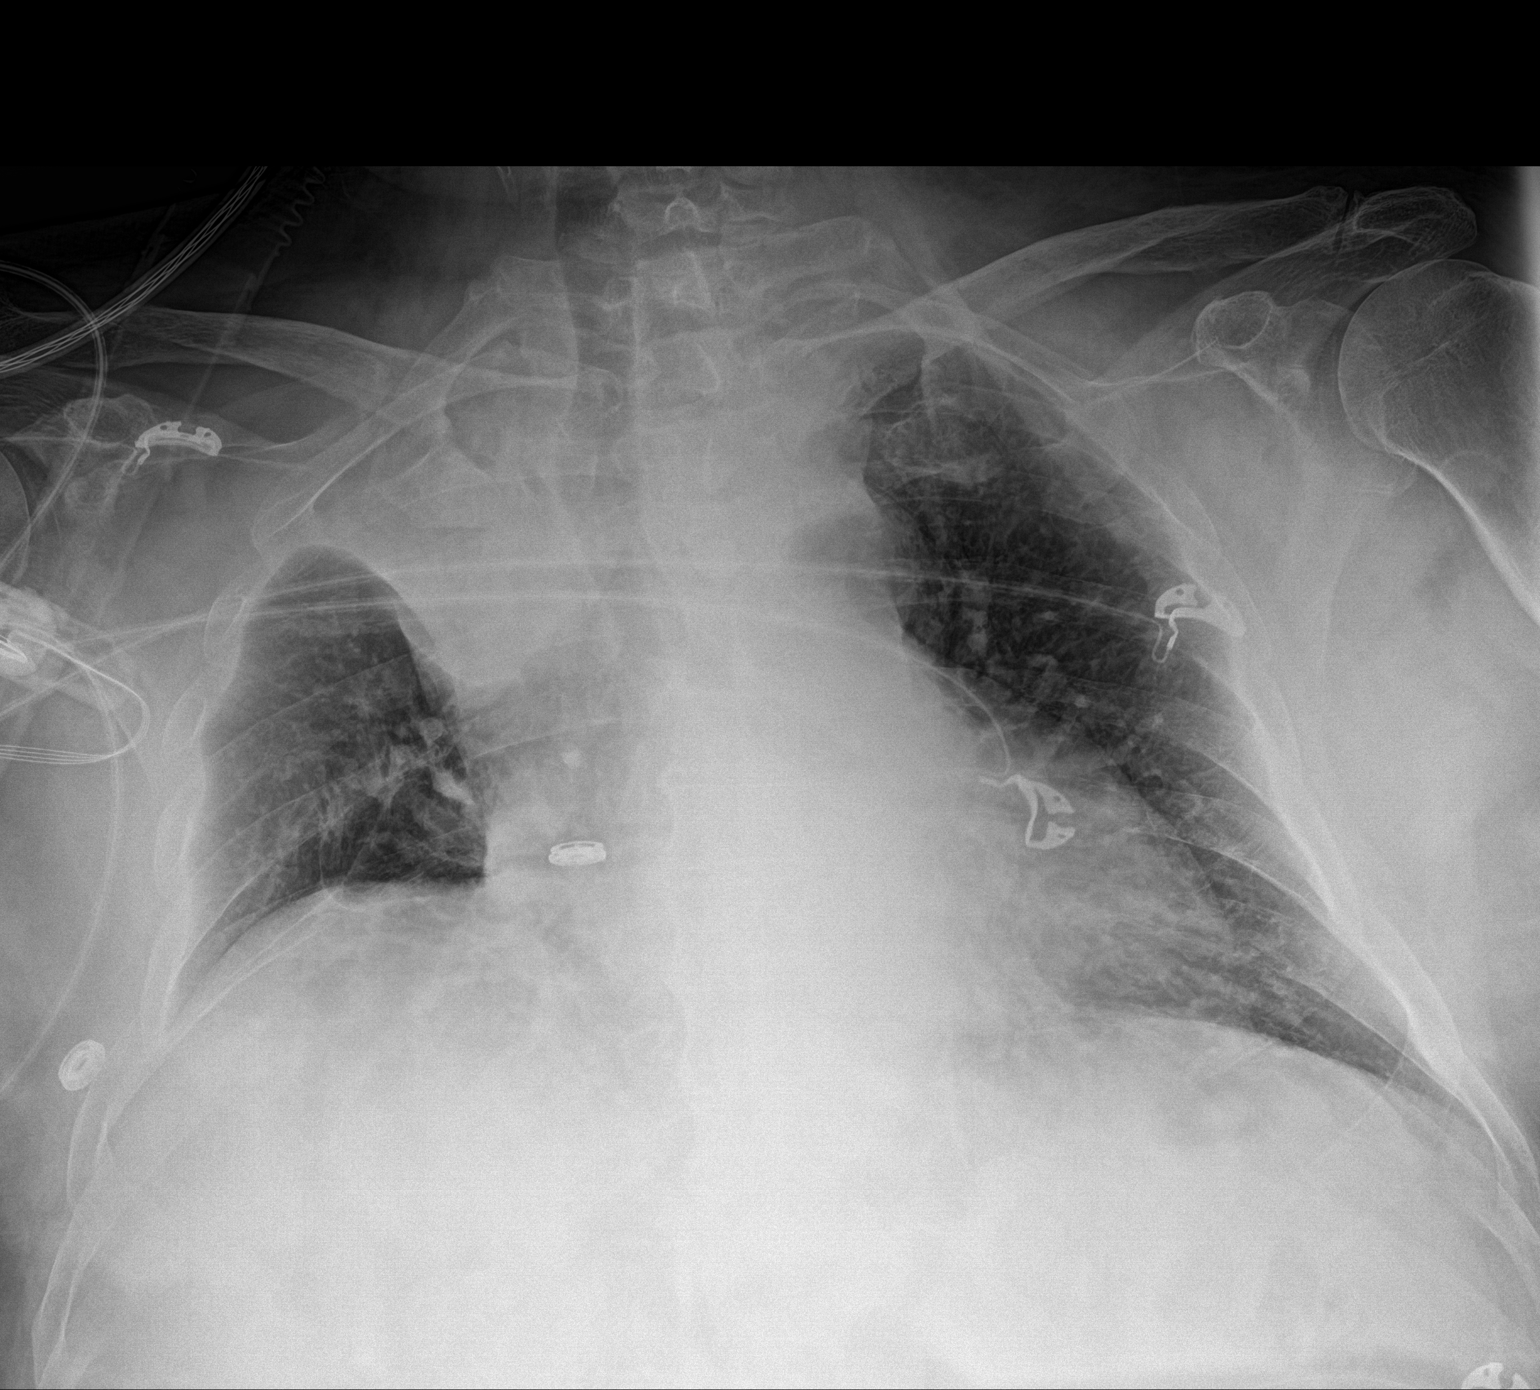

[1 of 1 positions shown; findings below may reference images not displayed]

FINDINGS: Cardiac silhouette mildly enlarged for the AP portable technique and
the patient's suboptimal inspiration. Complete dense atelectasis of
the RIGHT UPPER LOBE. Mild atelectasis at the RIGHT lung base. LEFT
lung remains clear. Pulmonary vascularity normal. No visible pleural
effusions.
IMPRESSION: 1. Complete dense atelectasis of the RIGHT UPPER LOBE. Mild
atelectasis at the RIGHT lung base.
2. Mild cardiomegaly without pulmonary edema.

## 2020-03-12 SURGERY — ENDARTERECTOMY, SUBCLAVIAN
Anesthesia: General | Laterality: Left

## 2020-03-12 MED ORDER — PROMETHAZINE HCL 25 MG/ML IJ SOLN
12.5000 mg | Freq: Four times a day (QID) | INTRAMUSCULAR | Status: DC | PRN
  Administered 2020-03-12: 12.5 mg via INTRAVENOUS
  Filled 2020-03-12: qty 1

## 2020-03-12 MED ORDER — ROCURONIUM BROMIDE 100 MG/10ML IV SOLN
INTRAVENOUS | Status: DC | PRN
  Administered 2020-03-12: 50 mg via INTRAVENOUS

## 2020-03-12 MED ORDER — PROPOFOL 10 MG/ML IV BOLUS
INTRAVENOUS | Status: AC
  Filled 2020-03-12: qty 40

## 2020-03-12 MED ORDER — HEMOSTATIC AGENTS (NO CHARGE) OPTIME
TOPICAL | Status: DC | PRN
  Administered 2020-03-12: 1 via TOPICAL

## 2020-03-12 MED ORDER — HYDRALAZINE HCL 20 MG/ML IJ SOLN
5.0000 mg | Freq: Once | INTRAMUSCULAR | Status: AC
  Administered 2020-03-12: 5 mg via INTRAVENOUS

## 2020-03-12 MED ORDER — ESMOLOL HCL 100 MG/10ML IV SOLN
INTRAVENOUS | Status: DC | PRN
  Administered 2020-03-12 (×2): 30 mg via INTRAVENOUS

## 2020-03-12 MED ORDER — THROMBIN (RECOMBINANT) 20000 UNITS EX SOLR
CUTANEOUS | Status: AC
  Filled 2020-03-12: qty 20000

## 2020-03-12 MED ORDER — FENTANYL CITRATE (PF) 250 MCG/5ML IJ SOLN
INTRAMUSCULAR | Status: AC
  Filled 2020-03-12: qty 5

## 2020-03-12 MED ORDER — PROPOFOL 10 MG/ML IV BOLUS
INTRAVENOUS | Status: DC | PRN
  Administered 2020-03-12: 200 mg via INTRAVENOUS

## 2020-03-12 MED ORDER — CLEVIDIPINE BUTYRATE 0.5 MG/ML IV EMUL
0.0000 mg/h | INTRAVENOUS | Status: DC
  Administered 2020-03-12 – 2020-03-13 (×9): 21 mg/h via INTRAVENOUS
  Administered 2020-03-13: 10 mg/h via INTRAVENOUS
  Administered 2020-03-13: 12 mg/h via INTRAVENOUS
  Administered 2020-03-13: 21 mg/h via INTRAVENOUS
  Administered 2020-03-13: 17 mg/h via INTRAVENOUS
  Administered 2020-03-14: 18 mg/h via INTRAVENOUS
  Administered 2020-03-14: 17 mg/h via INTRAVENOUS
  Administered 2020-03-14: 11 mg/h via INTRAVENOUS
  Administered 2020-03-14 (×4): 18 mg/h via INTRAVENOUS
  Administered 2020-03-15 (×2): 12 mg/h via INTRAVENOUS
  Administered 2020-03-15: 19 mg/h via INTRAVENOUS
  Administered 2020-03-15: 12 mg/h via INTRAVENOUS
  Administered 2020-03-15: 15 mg/h via INTRAVENOUS
  Administered 2020-03-15: 17 mg/h via INTRAVENOUS
  Administered 2020-03-15 (×2): 18 mg/h via INTRAVENOUS
  Administered 2020-03-16: 10 mg/h via INTRAVENOUS
  Administered 2020-03-16 (×2): 15 mg/h via INTRAVENOUS
  Administered 2020-03-16: 13 mg/h via INTRAVENOUS
  Administered 2020-03-16: 10 mg/h via INTRAVENOUS
  Administered 2020-03-17: 17 mg/h via INTRAVENOUS
  Administered 2020-03-17: 7 mg/h via INTRAVENOUS
  Administered 2020-03-17 (×2): 11 mg/h via INTRAVENOUS
  Filled 2020-03-12 (×41): qty 100

## 2020-03-12 MED ORDER — LIDOCAINE HCL (PF) 1 % IJ SOLN
INTRAMUSCULAR | Status: AC
  Filled 2020-03-12: qty 30

## 2020-03-12 MED ORDER — CLEVIDIPINE BUTYRATE 0.5 MG/ML IV EMUL
0.0000 mg/h | INTRAVENOUS | Status: AC
  Administered 2020-03-12: 1 mg/h via INTRAVENOUS
  Filled 2020-03-12: qty 50

## 2020-03-12 MED ORDER — LACTATED RINGERS IV SOLN: INTRAVENOUS | Status: DC | PRN

## 2020-03-12 MED ORDER — SODIUM CHLORIDE 0.9 % IV SOLN
INTRAVENOUS | Status: DC | PRN
  Administered 2020-03-12: 05:00:00 500 mL

## 2020-03-12 MED ORDER — FOLIC ACID 5 MG/ML IJ SOLN
1.0000 mg | Freq: Every day | INTRAMUSCULAR | Status: DC
  Administered 2020-03-12 – 2020-03-14 (×3): 1 mg via INTRAVENOUS
  Filled 2020-03-12 (×3): qty 0.2

## 2020-03-12 MED ORDER — PHENYLEPHRINE HCL-NACL 10-0.9 MG/250ML-% IV SOLN
INTRAVENOUS | Status: DC | PRN
  Administered 2020-03-12: 40 ug/min via INTRAVENOUS

## 2020-03-12 MED ORDER — SODIUM CHLORIDE 0.9 % IV SOLN
INTRAVENOUS | Status: AC
  Filled 2020-03-12: qty 1.2

## 2020-03-12 MED ORDER — CEFAZOLIN SODIUM-DEXTROSE 2-3 GM-%(50ML) IV SOLR
INTRAVENOUS | Status: DC | PRN
  Administered 2020-03-12: 2 g via INTRAVENOUS

## 2020-03-12 MED ORDER — FENTANYL CITRATE (PF) 250 MCG/5ML IJ SOLN
INTRAMUSCULAR | Status: DC | PRN
  Administered 2020-03-12: 100 ug via INTRAVENOUS
  Administered 2020-03-12: 50 ug via INTRAVENOUS

## 2020-03-12 MED ORDER — EPHEDRINE SULFATE 50 MG/ML IJ SOLN
INTRAMUSCULAR | Status: DC | PRN
  Administered 2020-03-12 (×2): 5 mg via INTRAVENOUS

## 2020-03-12 MED ORDER — 0.9 % SODIUM CHLORIDE (POUR BTL) OPTIME
TOPICAL | Status: DC | PRN
  Administered 2020-03-12: 1000 mL

## 2020-03-12 MED ORDER — ONDANSETRON HCL 4 MG/2ML IJ SOLN
INTRAMUSCULAR | Status: DC | PRN
  Administered 2020-03-12: 4 mg via INTRAVENOUS

## 2020-03-12 MED ORDER — METOPROLOL TARTRATE 5 MG/5ML IV SOLN
5.0000 mg | Freq: Four times a day (QID) | INTRAVENOUS | Status: DC
  Administered 2020-03-12 – 2020-03-13 (×4): 5 mg via INTRAVENOUS
  Filled 2020-03-12 (×4): qty 5

## 2020-03-12 MED ORDER — SUGAMMADEX SODIUM 500 MG/5ML IV SOLN
INTRAVENOUS | Status: DC | PRN
  Administered 2020-03-12: 300 mg via INTRAVENOUS

## 2020-03-12 MED ORDER — SODIUM CHLORIDE 3 % IN NEBU
4.0000 mL | INHALATION_SOLUTION | Freq: Three times a day (TID) | RESPIRATORY_TRACT | Status: AC
  Administered 2020-03-12 – 2020-03-15 (×8): 4 mL via RESPIRATORY_TRACT
  Filled 2020-03-12 (×9): qty 4

## 2020-03-12 MED ORDER — FENTANYL CITRATE (PF) 100 MCG/2ML IJ SOLN
25.0000 ug | INTRAMUSCULAR | Status: DC | PRN
  Administered 2020-03-12 (×2): 50 ug via INTRAVENOUS

## 2020-03-12 MED ORDER — CLOPIDOGREL BISULFATE 75 MG PO TABS: 75.0000 mg | ORAL_TABLET | Freq: Every day | ORAL | Status: DC

## 2020-03-12 MED ORDER — STERILE WATER FOR IRRIGATION IR SOLN
Status: DC | PRN
  Administered 2020-03-12: 1000 mL

## 2020-03-12 MED ORDER — AMISULPRIDE (ANTIEMETIC) 5 MG/2ML IV SOLN: 10.0000 mg | Freq: Once | INTRAVENOUS | Status: DC | PRN

## 2020-03-12 MED ORDER — SUCCINYLCHOLINE CHLORIDE 20 MG/ML IJ SOLN
INTRAMUSCULAR | Status: DC | PRN
  Administered 2020-03-12: 120 mg via INTRAVENOUS

## 2020-03-12 MED ORDER — LIDOCAINE HCL (CARDIAC) PF 100 MG/5ML IV SOSY
PREFILLED_SYRINGE | INTRAVENOUS | Status: DC | PRN
  Administered 2020-03-12: 60 mg via INTRATRACHEAL

## 2020-03-12 MED ORDER — FENTANYL CITRATE (PF) 100 MCG/2ML IJ SOLN
INTRAMUSCULAR | Status: AC
  Filled 2020-03-12: qty 2

## 2020-03-12 MED ORDER — PANTOPRAZOLE SODIUM 40 MG IV SOLR
40.0000 mg | INTRAVENOUS | Status: DC
  Administered 2020-03-12 – 2020-03-13 (×2): 40 mg via INTRAVENOUS
  Filled 2020-03-12 (×2): qty 40

## 2020-03-12 MED ORDER — ENOXAPARIN SODIUM 40 MG/0.4ML ~~LOC~~ SOLN: 40.0000 mg | Freq: Every day | SUBCUTANEOUS | Status: DC

## 2020-03-12 SURGICAL SUPPLY — 56 items
BAG DECANTER FOR FLEXI CONT (MISCELLANEOUS) ×3 IMPLANT
CANISTER SUCT 3000ML PPV (MISCELLANEOUS) ×3 IMPLANT
CATH ROBINSON RED A/P 18FR (CATHETERS) ×3 IMPLANT
CLIP VESOCCLUDE MED 24/CT (CLIP) IMPLANT
CLIP VESOCCLUDE MED 6/CT (CLIP) ×3 IMPLANT
CLIP VESOCCLUDE SM WIDE 24/CT (CLIP) IMPLANT
CLIP VESOCCLUDE SM WIDE 6/CT (CLIP) ×3 IMPLANT
COVER PROBE W GEL 5X96 (DRAPES) IMPLANT
COVER WAND RF STERILE (DRAPES) IMPLANT
DERMABOND ADVANCED (GAUZE/BANDAGES/DRESSINGS) ×2
DERMABOND ADVANCED .7 DNX12 (GAUZE/BANDAGES/DRESSINGS) ×1 IMPLANT
DRAIN CHANNEL 15F RND FF W/TCR (WOUND CARE) IMPLANT
ELECT REM PT RETURN 9FT ADLT (ELECTROSURGICAL) ×3
ELECTRODE REM PT RTRN 9FT ADLT (ELECTROSURGICAL) ×1 IMPLANT
EVACUATOR SILICONE 100CC (DRAIN) ×3 IMPLANT
GAUZE SPONGE 2X2 8PLY STRL LF (GAUZE/BANDAGES/DRESSINGS) ×1 IMPLANT
GLOVE BIO SURGEON STRL SZ7.5 (GLOVE) ×3 IMPLANT
GLOVE BIOGEL PI IND STRL 6.5 (GLOVE) ×1 IMPLANT
GLOVE BIOGEL PI IND STRL 7.0 (GLOVE) ×1 IMPLANT
GLOVE BIOGEL PI IND STRL 7.5 (GLOVE) ×1 IMPLANT
GLOVE BIOGEL PI IND STRL 8 (GLOVE) ×1 IMPLANT
GLOVE BIOGEL PI INDICATOR 6.5 (GLOVE) ×2
GLOVE BIOGEL PI INDICATOR 7.0 (GLOVE) ×2
GLOVE BIOGEL PI INDICATOR 7.5 (GLOVE) ×2
GLOVE BIOGEL PI INDICATOR 8 (GLOVE) ×2
GLOVE SURG SS PI 7.5 STRL IVOR (GLOVE) ×3 IMPLANT
GOWN STRL REUS W/ TWL LRG LVL3 (GOWN DISPOSABLE) ×3 IMPLANT
GOWN STRL REUS W/ TWL XL LVL3 (GOWN DISPOSABLE) ×1 IMPLANT
GOWN STRL REUS W/TWL LRG LVL3 (GOWN DISPOSABLE) ×6
GOWN STRL REUS W/TWL XL LVL3 (GOWN DISPOSABLE) ×2
HEMOSTAT SNOW SURGICEL 2X4 (HEMOSTASIS) ×3 IMPLANT
KIT BASIN OR (CUSTOM PROCEDURE TRAY) ×3 IMPLANT
KIT TURNOVER KIT B (KITS) ×3 IMPLANT
NS IRRIG 1000ML POUR BTL (IV SOLUTION) ×6 IMPLANT
PACK CAROTID (CUSTOM PROCEDURE TRAY) ×3 IMPLANT
PAD ARMBOARD 7.5X6 YLW CONV (MISCELLANEOUS) ×6 IMPLANT
POSITIONER HEAD DONUT 9IN (MISCELLANEOUS) ×3 IMPLANT
SET COLLECT BLD 21X3/4 12 (NEEDLE) IMPLANT
SHUNT CAROTID BYPASS 10 (VASCULAR PRODUCTS) IMPLANT
SHUNT CAROTID BYPASS 12FRX15.5 (VASCULAR PRODUCTS) IMPLANT
SPECIMEN JAR SMALL (MISCELLANEOUS) IMPLANT
SPONGE GAUZE 2X2 STER 10/PKG (GAUZE/BANDAGES/DRESSINGS) ×2
SUT ETHILON 3 0 PS 1 (SUTURE) ×6 IMPLANT
SUT MNCRL AB 4-0 PS2 18 (SUTURE) ×3 IMPLANT
SUT PROLENE 5 0 C 1 36 (SUTURE) ×3 IMPLANT
SUT PROLENE 6 0 BV (SUTURE) ×12 IMPLANT
SUT PROLENE 7 0 BV 1 (SUTURE) IMPLANT
SUT SILK 2 0 PERMA HAND 18 BK (SUTURE) IMPLANT
SUT VIC AB 3-0 SH 27 (SUTURE) ×2
SUT VIC AB 3-0 SH 27X BRD (SUTURE) ×1 IMPLANT
SYR TB 1ML LUER SLIP (SYRINGE) IMPLANT
SYSTEM CHEST DRAIN TLS 7FR (DRAIN) IMPLANT
TAPE CLOTH SURG 4X10 WHT LF (GAUZE/BANDAGES/DRESSINGS) ×3 IMPLANT
TOWEL GREEN STERILE (TOWEL DISPOSABLE) ×6 IMPLANT
TRAY FOLEY MTR SLVR 16FR STAT (SET/KITS/TRAYS/PACK) IMPLANT
WATER STERILE IRR 1000ML POUR (IV SOLUTION) ×3 IMPLANT

## 2020-03-12 NOTE — Op Note (Signed)
Date: March 12, 2020  Preoperative diagnosis: Left neck hematoma following carotid endarterectomy  Postoperative diagnosis: Same  Procedure: Evacuation of left neck hematoma with placement of surgical drain (10 French flat drain)  Surgeon: Dr. Marty Heck, MD  Assistant: Roxy Horseman, PA  Indications: Patient is a 74 year old male who is postop day 1 status post left carotid endarterectomy.  Noted to have sudden development of a large left neck hematoma and I was called about early this morning.  We took him emergently the OR for washout of his neck hematoma.  An assistant was needed given the possible complexity of the case.  Findings: The patch itself looked intact with no obvious bleeding.  There did appear to be a suture disrupted from the facial vein where there was active bleeding.  The hematoma was completely evacuated.  10 French flat drain was placed in the surgical wound.  Anesthesia: General  Details: Patient was taken emergently to the operating room.  He was placed supine on table and general anesthesia was induced.  He was placed with a shoulder roll and his neck was turned away.  His left neck was then prepped and draped in usual sterile fashion.  He did get preoperative antibiotics.  Ultimately we reopened his previous incision with 11 blade scalpel and Metzenbaum scissors and evacuated fairly large hematoma.  Initial inspection of the patch did not show any disruption of the patch or bleeding from the patch itself.  Most of the soft tissue was very oozy.  As we continued to evacuate hematoma, itI did appear that the suture on on the facial vein had become disrupted with active venous bleeding.  This was controlled a right angle clamp and then tied off with a 3-0 silk..  We continued to irrigate out the wound until we got out all the hematoma and old Surgicel.  I was very deliberate in inspecting all the tissue and I did not see any other source of bleeding.   When I manually manipulated the patch there was a little bit of oozing proximally on the common carotid that I placed some 6-0 Prolene interrupted, but I do not think this was any obvious source of bleeding.  Ultimately given the amount of ooziness in the soft tissue I put Surgicel snow around the endarterectomy site.  A 10 French flat drain was tunneled in the surgical bed out the subcutaneous soft tissue in the neck.  This platysma was closed with 3-0 Vicryl and skin was closed with 4-0 Monocryl and Dermabond.  Awakened from anesthesia with no new deficits.  Complication: None  Condition: Stable  Marty Heck, MD [facility] [place] Office: [place]

## 2020-03-12 NOTE — Progress Notes (Signed)
Pt received from PACU. On arrival to 4E R neck incision noted to be hard and swollen. Dr. Carlis Abbott called to bedside for assessment.  Clyde Canterbury, RN

## 2020-03-12 NOTE — Anesthesia Procedure Notes (Signed)
Procedure Name: Intubation Date/Time: 03/12/2020 5:02 AM Performed by: Clovis Cao, CRNA Pre-anesthesia Checklist: Patient identified, Emergency Drugs available, Suction available, Patient being monitored and Timeout performed Patient Re-evaluated:Patient Re-evaluated prior to induction Oxygen Delivery Method: Circle system utilized Preoxygenation: Pre-oxygenation with 100% oxygen Induction Type: IV induction, Rapid sequence and Cricoid Pressure applied Laryngoscope Size: Glidescope Grade View: Grade I Tube type: Oral Tube size: 8.0 mm Number of attempts: 1 Airway Equipment and Method: Stylet and Video-laryngoscopy Placement Confirmation: ETT inserted through vocal cords under direct vision,  positive ETCO2 and breath sounds checked- equal and bilateral Secured at: 22 cm Tube secured with: Tape Dental Injury: Teeth and Oropharynx as per pre-operative assessment

## 2020-03-12 NOTE — Progress Notes (Signed)
PROGRESS NOTE    [name] [name]  CZY:[id number] DOB: 1946-03-27 DOA: 03/08/2020 PCP: [name], MD    Brief Narrative:  74 y.o.malewith medical history significant formemory loss, chronic kidney disease, type 2 diabetes mellitus, AAA, hypertension, and hyperlipidemia, now presenting to the emergency department for evaluation of confusion, hypoglycemia, and a fall.  MRI showed stroke involving the L frontal and ocipital lobes.  He was noted to have L carotid artery disease and vascular surgery was c/s for CEA.  S/p CEA on 9/10. Developed large post-procedural hematoma requiring evacuation of hematoma  Assessment & Plan:   Principal Problem:   Acute ischemic stroke [facility]) Active Problems:   Diabetes mellitus type II, non insulin dependent ([place])   Memory loss   Renal insufficiency   Hypertension   Stroke ([place])   1.Acute ischemic CVA -Presents with speech difficulty, confusion, and a fall and is found to have acute ischemic infarctions on MRI involving left frontal and occipital lobes - CTA head/neck with extensive atheromatous disease of the aorta and branch vessels.  30% luminal narrowing of innominate 2/2 atheromatous plaque.  50% narrowing of L carotid bulb and 80-90% narrowing of proximal L ICA 2/2 atheromaous plaque.  Mild R V1 and V4 segment narrowing 2/2 atheromatous diseae.  Mild narrowing of proximal R petrous and L paraclinoid ICA. - carotid US notable for L ICA velocities consistent with 60-79% stenosis, PSV at bulb suggest 80-99% stenosis.  -Neurology, recommended crestor 40 mg, vascular surgery consult -> he's now s/p L CEA on 9/10 by vascular, see below - echo with EF 55-60%, no RWMA (see report) -Continue cardiac monitoring, frequent neuro checks -PT/OT/SLP eval -> OT recommending outpatient OT, 24 hr supervision/assistance, 3 in 1 bedside commode.  Outpatient SLP.  No PT follow up.   - LDL 95, a1c 6.8  - Following development of hematoma, discussed case with  stroke MD who recommends holding ASA and plavix for now and when cleared to resume antiplatelet by vascular surgery, to resume ASA alone  # Carotid Artery Stenosis: s/p L CEA 9/10. Overnight, pt developed large post-procedural hematoma, required evacuation and placement of drainage catheter. Vascular surgery following. Concern for bleed secondary to uncontrolled BP overnight, see below. Now NPO on Cleviprex gtt per vascular. Cont aggressive Bp control  2.Type II DM - on glimepiride at home, currently on hold - A1c 6.8 - continue SSI as needed  3.Renal insufficiency -SCr is 1.46 on admission - creatinine 1.24 today, continue to follow -He has CKD on problem list but most recent prior labs available are from 2014 when SCr was 0.9 -Renally-dose medications, monitor  4.Hypertension - Had initially allowed permissive HTN -BP suboptimally controlled overnight, likely precipitated acute blood loss post-CEA -Pt now on Cleviprex gtt per Vascular surgery with much improved BP -Plan to ultimately wean off Cleviprex and titrate bp meds when stable.   5.Hyperlipidemia -Continue Crestor when no longer NPO -LDL of 95  6.Memory loss Etoh Abuse:  -Followed by neurology, treated with Aricept and Namenda - thiamine level (elevated) - on high dose thiamine x 3 days, then 100 mg daily - follow B12 (low normal, follow MMA, pending) - continue folate supplementation - CIWA  7.OSA -Patient reports CPAP intolerance  # Cough: x2-3 weeks, follow CXR - follow outpatient - CXR with mild peribronchial thickening, consider discharge with inhaler  9. Acute blood loss anemia -secondary to L neck hematoma following CEA, likely related to poorly controlled BP -Repeat CBC this afternoon reviewed, hgb stable -Will repeat  CBC in AM  DVT prophylaxis: SCD's Code Status: Full Family Communication: Pt in room, family not at bedside  Status is: Inpatient  Remains inpatient  appropriate because:Hemodynamically unstable, Ongoing diagnostic testing needed not appropriate for outpatient work up, Unsafe d/c plan, IV treatments appropriate due to intensity of illness or inability to take PO and Inpatient level of care appropriate due to severity of illness   Dispo: The patient is from: Home              Anticipated d/c is to: Home              Anticipated d/c date is: > 3 days              Patient currently is not medically stable to d/c.       Consultants:   Neurology  Vascular surgery  Procedures:   CEA 9/10  Evacuation of L neck hematoma 9/11  Antimicrobials: Anti-infectives (From admission, onward)   Start     Dose/Rate Route Frequency Ordered Stop   03/11/20 1800  ceFAZolin (ANCEF) IVPB 2g/100 mL premix        2 g 200 mL/hr over 30 Minutes Intravenous Every 8 hours 03/11/20 1740 03/12/20 0138       Subjective: Unable to assess give neck hematoma.  Objective: Vitals:   03/12/20 1130 03/12/20 1200 03/12/20 1223 03/12/20 1227  BP:  (!) 116/46 (!) 116/46 (!) 116/46  Pulse:  74 76 76  Resp:  11 (!) 24 (!) 24  Temp: 97.7 F (36.5 C)   97.7 F (36.5 C)  TempSrc: Oral     SpO2:  94% 93% 93%  Weight:      Height:        Intake/Output Summary (Last 24 hours) at 03/12/2020 1300 Last data filed at 03/12/2020 1200 Gross per 24 hour  Intake 4312.1 ml  Output 1935 ml  Net 2377.1 ml   Filed Weights   03/09/20 0201  Weight: 84.1 kg    Examination:  General exam: Appears calm and comfortable, L neck with marked swelling Respiratory system: Clear to auscultation. Respiratory effort normal. Cardiovascular system: S1 & S2 heard, Regular Gastrointestinal system: Abdomen is nondistended, soft and nontender. No organomegaly or masses felt. Normal bowel sounds heard. Central nervous system: Alert and oriented. No focal neurological deficits. Extremities: Symmetric 5 x 5 power. Skin: No rashes, lesions, post-op incision line L  neck Psychiatry: difficult to assess as pt is not verbal at this time  Data Reviewed: I have personally reviewed following labs and imaging studies  CBC: Recent Labs  Lab 03/08/20 1613 03/08/20 1613 03/09/20 1000 03/09/20 1000 03/10/20 0423 03/11/20 0139 03/12/20 0408 03/12/20 0823 03/12/20 1130  WBC 12.7*   < > 9.0  --  8.5 7.1 9.4  --  12.7*  NEUTROABS 10.6*  --   --   --  5.8 3.9  --   --   --   HGB 14.0   < > 12.1*   < > 12.4* 12.4* 10.6* 9.5* 10.0*  HCT 41.7   < > 36.7*   < > 37.6* 36.5* 32.2* 28.0* 30.7*  MCV 91.0   < > 90.2  --  91.9 91.0 91.0  --  94.5  PLT 187   < > 155  --  148* 163 173  --  201   < > = values in this interval not displayed.   Basic Metabolic Panel: Recent Labs  Lab 03/08/20 1613 03/08/20 1613 03/09/20 1027  03/09/20 1000 03/10/20 0423 03/11/20 0139 03/12/20 0408 03/12/20 0823  NA 136   < >  --  138 140 140 140 143  K 4.2   < >  --  3.4* 3.4* 3.8 4.1 3.7  CL 102  --   --  105 107 105 109  --   CO2 24  --   --  23 24 24  21*  --   GLUCOSE 191*  --   --  203* 106* 90 145*  --   BUN 20  --   --  14 12 13 12   --   CREATININE 1.46*  --   --  1.33* 1.20 1.19 1.24  --   CALCIUM 9.7  --   --  9.1 9.3 9.6 8.8*  --   MG  --   --  2.2  --  2.0 2.0  --   --   PHOS  --   --   --   --  2.7 3.4  --   --    < > = values in this interval not displayed.   GFR: Estimated Creatinine Clearance: 52.3 mL/min (by C-G formula based on SCr of 1.24 mg/dL). Liver Function Tests: Recent Labs  Lab 03/08/20 1613 03/09/20 1000 03/10/20 0423 03/11/20 0139  AST 39 34 31 26  ALT 30 29 33 33  ALKPHOS 120 101 98 116  BILITOT 0.7 1.1 1.0 0.9  PROT 6.7 5.9* 6.3* 6.1*  ALBUMIN 3.9 3.3* 3.4* 3.1*   Recent Labs  Lab 03/08/20 1613  LIPASE 28   No results for input(s): AMMONIA in the last 168 hours. Coagulation Profile: Recent Labs  Lab 03/08/20 1613 03/11/20 0139  INR 0.9 0.9   Cardiac Enzymes: No results for input(s): CKTOTAL, CKMB, CKMBINDEX, TROPONINI in  the last 168 hours. BNP (last 3 results) No results for input(s): PROBNP in the last 8760 hours. HbA1C: No results for input(s): HGBA1C in the last 72 hours. CBG: Recent Labs  Lab 03/11/20 1520 03/11/20 1746 03/11/20 2147 03/12/20 0603 03/12/20 1128  GLUCAP 126* 137* 227* 130* 129*   Lipid Profile: No results for input(s): CHOL, HDL, LDLCALC, TRIG, CHOLHDL, LDLDIRECT in the last 72 hours. Thyroid Function Tests: No results for input(s): TSH, T4TOTAL, FREET4, T3FREE, THYROIDAB in the last 72 hours. Anemia Panel: No results for input(s): VITAMINB12, FOLATE, FERRITIN, TIBC, IRON, RETICCTPCT in the last 72 hours. Sepsis Labs: No results for input(s): PROCALCITON, LATICACIDVEN in the last 168 hours.  Recent Results (from the past 240 hour(s))  SARS Coronavirus 2 by RT PCR (hospital order, performed in [facility] hospital lab) Nasopharyngeal Nasopharyngeal Swab     Status: None   Collection Time: 03/08/20  5:03 PM   Specimen: Nasopharyngeal Swab  Result Value Ref Range Status   SARS Coronavirus 2 NEGATIVE NEGATIVE Final    Comment: (NOTE) SARS-CoV-2 target nucleic acids are NOT DETECTED.  The SARS-CoV-2 RNA is generally detectable in upper and lower respiratory specimens during the acute phase of infection. The lowest concentration of SARS-CoV-2 viral copies this assay can detect is 250 copies / mL. A negative result does not preclude SARS-CoV-2 infection and should not be used as the sole basis for treatment or other patient management decisions.  A negative result may occur with improper specimen collection / handling, submission of specimen other than nasopharyngeal swab, presence of viral mutation(s) within the areas targeted by this assay, and inadequate number of viral copies (<250 copies / mL). A negative result must be  combined with clinical observations, patient history, and epidemiological information.  Fact Sheet for Patients:    StrictlyIdeas.no  Fact Sheet for Healthcare Providers: BankingDealers.co.za  This test is not yet approved or  cleared by the [place] FDA and has been authorized for detection and/or diagnosis of SARS-CoV-2 by FDA under an Emergency Use Authorization (EUA).  This EUA will remain in effect (meaning this test can be used) for the duration of the COVID-19 declaration under Section 564(b)(1) of the Act, 21 U.S.C. section 360bbb-3(b)(1), unless the authorization is terminated or revoked sooner.  Performed at [facility], [place] [address], [place], [place] [address]   Culture, Urine     Status: None   Collection Time: 03/08/20 10:21 PM   Specimen: Urine, Random  Result Value Ref Range Status   Specimen Description URINE, RANDOM  Final   Special Requests NONE  Final   Culture   Final    NO GROWTH Performed at [facility], [place] [address], [place], [place] [address]    Report Status 03/09/2020 FINAL  Final  MRSA PCR Screening     Status: None   Collection Time: 03/11/20  6:34 AM   Specimen: Nasopharyngeal  Result Value Ref Range Status   MRSA by PCR NEGATIVE NEGATIVE Final    Comment:        The GeneXpert MRSA Assay (FDA approved for NASAL specimens only), is one component of a comprehensive MRSA colonization surveillance program. It is not intended to diagnose MRSA infection nor to guide or monitor treatment for MRSA infections. Performed at [facility], [place] [address] [place], [place] [address]      Radiology Studies: DG CHEST PORT 1 VIEW  Result Date: 03/10/2020 CLINICAL DATA:  Cough. EXAM: PORTABLE CHEST 1 VIEW COMPARISON:  Radiograph 12/05/2019 FINDINGS: Mild cardiomegaly, similar to prior. Unchanged mediastinal contours. Mild peribronchial thickening. No confluent airspace disease. No significant pleural effusion. No pneumothorax. No acute osseous abnormalities are seen. IMPRESSION: 1. Stable mild  cardiomegaly. 2. Mild peribronchial thickening, can be seen with bronchitis or asthma. Electronically Signed   By: Keith Rake M.D.   On: 03/10/2020 17:31    Scheduled Meds: . Chlorhexidine Gluconate Cloth  6 each Topical Daily  . [START ON 03/13/2020] enoxaparin (LOVENOX) injection  40 mg Subcutaneous QHS  . fentaNYL      . folic acid  1 mg Intravenous Daily  . insulin aspart  0-5 Units Subcutaneous QHS  . insulin aspart  0-9 Units Subcutaneous TID WC  . LORazepam  0-4 mg Intravenous Q12H  . pantoprazole (PROTONIX) IV  40 mg Intravenous Q24H  . thiamine injection  100 mg Intravenous Daily   Continuous Infusions: . sodium chloride Stopped (03/11/20 0532)  . sodium chloride    . sodium chloride 100 mL/hr at 03/12/20 1200  . clevidipine 21 mg/hr (03/12/20 1200)  . magnesium sulfate bolus IVPB       LOS: 3 days   Marylu Lund, MD [facility] Pager On Amion  If 7PM-7AM, please contact night-coverage 03/12/2020, 1:00 PM

## 2020-03-12 NOTE — Treatment Plan (Signed)
Overnight events noted. Pt will be seen later and full note will be placed. Large hematoma noted at CEA site overnight and pt underwent emergent evacuation of hematoma, now transferred to [facility]. Discussed case with Stroke MD who recommends holding both ASA and plavix for now, and when clear to resume antiplatelet by Vascular Surgeon, to resume ASA 81mg  alone.

## 2020-03-12 NOTE — Transfer of Care (Signed)
Immediate Anesthesia Transfer of Care Note  Patient: [name] [name]  Procedure(s) Performed: EVACUATION HEMATOMA; WASHOUT OUT LEFT NECK WITH DRAIN PLACEMENT (Left )  Patient Location: PACU  Anesthesia Type:General  Level of Consciousness: drowsy  Airway & Oxygen Therapy: Patient Spontanous Breathing and Patient connected to face mask oxygen  Post-op Assessment: Report given to RN and Post -op Vital signs reviewed and stable  Post vital signs: Reviewed and stable  Last Vitals:  Vitals Value Taken Time  BP 127/63 03/12/20 0603  Temp    Pulse 70 03/12/20 0603  Resp 14 03/12/20 0603  SpO2 96 % 03/12/20 0603  Vitals shown include unvalidated device data.  Last Pain:  Vitals:   03/12/20 0312  TempSrc: Oral  PainSc:       Patients Stated Pain Goal: 2 (64/68/03 2122)  Complications: No complications documented.

## 2020-03-12 NOTE — Consult Note (Signed)
NAME:  [name] [name], MRN:  [id number], DOB:  [birth year], LOS: 3 ADMISSION DATE:  03/08/2020, CONSULTATION DATE:  03/12/2020  REFERRING MD:  Wyline Copas, triad, CHIEF COMPLAINT:  resp distress   Brief History   74 year old man admitted 9/7 with  left frontal and occipital CVA , underwent left CEA 9/10 , required left neck washout for hematoma.  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  Underwent left CEA 9/10.  Was on aspirin and Plavix developed left-sided hematoma and underwent evacuation by vascular 9/11, transferred to ICU for blood pressure management and Cleviprex drip.  Developed hypoxia through 9/11, PCCM consulted for finding of right upper lobe atelectasis on chest x-ray  Past Medical History  Diabetes type 2 Hypertension Hyperlipidemia  Significant Hospital Events   9/10 left CEA 9/11 transferred to ICU post evacuation of left hematoma for blood pressure management  Consults:  Vascular  Procedures:    Significant Diagnostic Tests:  MRI brain 9/7 small acute infarcts left frontal and occipital lobes, moderate cerebral atrophy, sequelae of remote bilateral basal ganglia thalamic and cerebellar insults MRI cervical and thoracic spine 9/7>> mild cervical spondylosis  CT angiogram neck 9/7 >> 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 30% luminal narrowing of the innominate secondary to atheromatous plaque.  Approximately 50% narrowing of the left carotid bulb and 80-90% narrowing of the proximal left ICA secondary to atheromatous plaque.  Micro Data:    Antimicrobials:     Interim history/subjective:    Objective   Blood pressure (!) 122/100, pulse (!) 102,  temperature 99.1 F (37.3 C), temperature source Oral, resp. rate (!) 21, height 5\' 9"  (1.753 m), weight 84.1 kg, SpO2 (!) 89 %.    FiO2 (%):  [50 %] 50 %   Intake/Output Summary (Last 24 hours) at 03/12/2020 1836 Last data filed at 03/12/2020 1451 Gross per 24 hour  Intake 2759.67 ml  Output 2335 ml  Net 424.67 ml   Filed Weights   03/09/20 0201  Weight: 84.1 kg    Examination: General: Elderly man, mild distress, on Ventimask HENT: Mild pallor, no icterus, extensive hematoma left neck with swelling extending to right of midline, JP drain with minimal frank blood , hoarse voice  Lungs: No accessory muscle use, able to speak in full sentences, decreased breath sounds on right axilla, clear on left Cardiovascular: S1-S2 regular Abdomen: Soft nontender abdomen Extremities: No rash, no edema Neuro: Awake, alert, interactive, power 4+/5 all 4 extremities, nonfocal   Chest x-ray 9/11 personally reviewed which shows right upper lobe atelectasis which is new compared to x-ray from 9/9  Labs show mild leukocytosis, stable anemia, normal electrolytes  Resolved Hospital Problem list     Assessment & Plan:  Acute respiratory failure related to right upper lobe atelectasis likely from aspirated blood due to airway bleeding,?  Traumatic intubation for emergent surgery Work of breathing is acceptable, currently requiring Ventimask  -We will try conservative management with incentive spirometry -Hypertonic saline nebs -Try to position right shoulder up when sleeping or sitting up position, he does have a good cough -No evidence of impending airway obstruction, hematoma appears soft, reviewed with vascular at bedside -Repeat chest x-ray in a.m., if worsening atelectasis may need intubation  and bronchoscopy unfortunately with this high oxygen requirements would not tolerate conscious sedation   Hypertensive urgency -Cleviprex drip for aggressive blood pressure control  Acute CVA Hematoma  post left CEA -aspirin and Plavix on hold   Best practice:  Diet: NPO Pain/Anxiety/Delirium protocol (if indicated): N/A VAP protocol (if indicated): N/A DVT prophylaxis: SCDs GI prophylaxis: Protonix Glucose control: SSI Mobility: Bedrest Code Status: Full Family Communication: per primary Disposition: ICU  Labs   CBC: Recent Labs  Lab 03/08/20 1613 03/08/20 1613 03/09/20 1000 03/09/20 1000 03/10/20 0423 03/11/20 0139 03/12/20 0408 03/12/20 0823 03/12/20 1130  WBC 12.7*   < > 9.0  --  8.5 7.1 9.4  --  12.7*  NEUTROABS 10.6*  --   --   --  5.8 3.9  --   --   --   HGB 14.0   < > 12.1*   < > 12.4* 12.4* 10.6* 9.5* 10.0*  HCT 41.7   < > 36.7*   < > 37.6* 36.5* 32.2* 28.0* 30.7*  MCV 91.0   < > 90.2  --  91.9 91.0 91.0  --  94.5  PLT 187   < > 155  --  148* 163 173  --  201   < > = values in this interval not displayed.    Basic Metabolic Panel: Recent Labs  Lab 03/08/20 1613 03/08/20 1613 03/09/20 0338 03/09/20 1000 03/10/20 0423 03/11/20 0139 03/12/20 0408 03/12/20 0823  NA 136   < >  --  138 140 140 140 143  K 4.2   < >  --  3.4* 3.4* 3.8 4.1 3.7  CL 102  --   --  105 107 105 109  --   CO2 24  --   --  23 24 24  21*  --   GLUCOSE 191*  --   --  203* 106* 90 145*  --   BUN 20  --   --  14 12 13 12   --   CREATININE 1.46*  --   --  1.33* 1.20 1.19 1.24  --   CALCIUM 9.7  --   --  9.1 9.3 9.6 8.8*  --   MG  --   --  2.2  --  2.0 2.0  --   --   PHOS  --   --   --   --  2.7 3.4  --   --    < > = values in this interval not displayed.   GFR: Estimated Creatinine Clearance: 52.3 mL/min (by C-G formula based on SCr of 1.24 mg/dL). Recent Labs  Lab 03/10/20 0423 03/11/20 0139 03/12/20 0408 03/12/20 1130  WBC 8.5 7.1 9.4 12.7*    Liver Function Tests: Recent Labs  Lab 03/08/20 1613 03/09/20 1000 03/10/20 0423 03/11/20 0139  AST 39 34 31 26  ALT 30 29 33 33  ALKPHOS 120 101 98 116  BILITOT 0.7 1.1 1.0 0.9  PROT 6.7 5.9* 6.3* 6.1*  ALBUMIN 3.9 3.3*  3.4* 3.1*   Recent Labs  Lab 03/08/20 1613  LIPASE 28   No results for input(s): AMMONIA in the last 168 hours.  ABG    Component Value Date/Time   PHART 7.347 (L) 03/12/2020 0823   PCO2ART 41.5 03/12/2020 0823   PO2ART 61 (L) 03/12/2020 0823   HCO3 22.8 03/12/2020 0823   TCO2 24 03/12/2020 0823   ACIDBASEDEF 3.0 (H) 03/12/2020 0823   O2SAT 90.0 03/12/2020 0823     Coagulation Profile:  Recent Labs  Lab 03/08/20 1613 03/11/20 0139  INR 0.9 0.9    Cardiac Enzymes: No results for input(s): CKTOTAL, CKMB, CKMBINDEX, TROPONINI in the last 168 hours.  HbA1C: Hgb A1c MFr Bld  Date/Time Value Ref Range Status  03/09/2020 03:38 AM 6.8 (H) 4.8 - 5.6 % Final    Comment:    (NOTE) Pre diabetes:          5.7%-6.4%  Diabetes:              >6.4%  Glycemic control for   <7.0% adults with diabetes     CBG: Recent Labs  Lab 03/11/20 1746 03/11/20 2147 03/12/20 0603 03/12/20 1128 03/12/20 1541  GLUCAP 137* 227* 130* 129* 132*    Review of Systems:   Reports breathing "okay" Hoarse voice Hematoma over left neck has become softer per RN and shifted to right of midline Progressive increased oxygen requirements through the day Coughed up and suctioned out blood on one occasion  Past Medical History  He,  has a past medical history of AAA (abdominal aortic aneurysm) ([place]), Arthritis, Back pain, CKD (chronic kidney disease), Diabetes mellitus without complication ([place]), Encephalopathy acute, GERD (gastroesophageal reflux disease), Gout, Gout, Hyperlipidemia, Hypertension, Lung nodule, solitary (2008), Memory change, Polyarthralgia, Reflux, and Stroke ([place]).   Surgical History    Past Surgical History:  Procedure Laterality Date  . ABDOMINAL AORTIC ENDOVASCULAR STENT GRAFT N/A 04/29/2013   Procedure: ABDOMINAL AORTIC ENDOVASCULAR STENT GRAFT -GORE;  Surgeon: Mal Misty, MD;  Location: [facility];  Service: Vascular;  Laterality: N/A;  . COLONOSCOPY  2010  . [place]  . TIBIA FRACTURE SURGERY Left   . VASECTOMY    . WRIST FRACTURE SURGERY Left 1970     Social History   reports that he quit smoking about 36 years ago. His smoking use included cigarettes. He has a 20.00 pack-year smoking history. He has never used smokeless tobacco. He reports current alcohol use of about 12.0 standard drinks of alcohol per week. He reports that he does not use drugs.   Family History   His family history includes Alcohol abuse in his brother, brother, father, maternal grandfather, maternal uncle, paternal grandfather, and paternal uncle; Coronary artery disease in his mother; Dementia in his maternal aunt; Diabetes in his brother, father, mother, and sister; Heart attack in his brother, father, and mother; Hypertension in his brother and brother; Stroke in his father.   Allergies No Known Allergies   Home Medications  Prior to Admission medications   Medication Sig Start Date End Date Taking? Authorizing Provider  acetaminophen (TYLENOL) 500 MG tablet Take 500 mg by mouth every 6 (six) hours as needed for mild pain or headache.    Yes [provider]  allopurinol (ZYLOPRIM) 100 MG tablet Take 200 mg by mouth daily.    Yes [provider]  citalopram (CELEXA) 20 MG tablet Take 20 mg by mouth daily.   Yes [provider]  clopidogrel (PLAVIX) 75 MG tablet Take 75 mg by mouth daily.  12/15/19  Yes [provider]  colestipol (COLESTID) 1 g tablet Take 1 g by mouth 2 (two) times daily between meals as needed (for diarrhea).  12/10/19  Yes [provider]  donepezil (ARICEPT) 10 MG tablet Take 10 mg by mouth every evening.  12/16/19  Yes [provider]  furosemide (LASIX) 20 MG tablet Take 20 mg by mouth daily as needed for edema.   Yes [provider]  glimepiride (AMARYL) 1 MG tablet Take 1 mg by mouth daily with breakfast.   Yes [provider]  HYDROcodone-homatropine (HYCODAN)  5-1.5 MG/5ML syrup Take 5 mLs by mouth every 6 (six) hours as needed for cough.   Yes [provider]  memantine (NAMENDA) 10 MG tablet Take 10 mg by mouth 2 (two) times daily.  12/16/19  Yes [provider]  metoprolol succinate (TOPROL-XL) 100 MG 24 hr tablet Take 100 mg by mouth daily. Take with or immediately following a meal.    Yes [provider]  Multiple Vitamins-Minerals ([employer] SPECTRAVITE MEN 50+) TABS Take 1 tablet by mouth daily with breakfast.   Yes [provider]  omeprazole (PRILOSEC) 40 MG capsule Take 40 mg by mouth daily before breakfast.   Yes [provider]  oxymetazoline (AFRIN) 0.05 % nasal spray Place 1 spray into both nostrils 2 (two) times daily as needed for congestion.   Yes [provider]  rosuvastatin (CRESTOR) 20 MG tablet Take 20 mg by mouth daily.    Yes [provider]  tamsulosin (FLOMAX) 0.4 MG CAPS capsule Take 0.4 mg by mouth daily.   Yes [provider]  citalopram (CELEXA) 10 MG tablet Take 10 mg by mouth daily.  Patient not taking: Reported on 03/08/2020 08/07/16   [provider]  furosemide (LASIX) 40 MG tablet Take 40 mg by mouth daily. Patient not taking: Reported on 03/08/2020    [provider]  metoprolol succinate (TOPROL-XL) 50 MG 24 hr tablet Take 50 mg by mouth daily. Take with or immediately following a meal. Patient not taking: Reported on 03/08/2020    [provider]  omeprazole (PRILOSEC) 20 MG capsule Take 20 mg by mouth daily. Patient not taking: Reported on 03/08/2020    [provider]     Kara Mead MD. FCCP. [facility]  If no response to pager , please call [phone]  After 7:00 pm call Elink  [phone]   03/12/2020

## 2020-03-12 NOTE — Progress Notes (Signed)
74 year old male postop day 1 status post left CEA.  Taken back this morning to OR to evacuate hematoma from his neck.  I only found a small vein that was bleeding otherwise the patch itself looked fine.  He subsequently reaccumulated neck hematoma after he got upstairs from PACU.  At that time his MAPS were greater than 601 with a systolic of over 093.  I think now we are dealing with labile blood pressures that is likely contributing to soft tissue oozing in the wound bed.  I am moving him to the ICU for Cleviprex drip and more aggressive monitoring of his blood pressure.  Have updated his wife.  Marty Heck, MD [facility] Office: [place]

## 2020-03-12 NOTE — Progress Notes (Signed)
Pt transferred to [facility] with rapid response RN. All belongings, including wedding band and glasses taken with patient. Patients wife, [name] Updated on new new room number.  Clyde Canterbury, RN

## 2020-03-12 NOTE — OR Nursing (Signed)
Patient neurologically intact on arrival to OR, good bilateral strength bilateral arms and legs; able to follow commands appropriately per Marliss Czar RN.  Tongue midline per anesthesia.

## 2020-03-12 NOTE — Progress Notes (Signed)
[facility]  Subjective  -moved to ICU for better control blood pressure to watch his left neck.  Neurologically intact.   Objective (!) 124/47 61 98.6 F (37 C) 14 93%  Intake/Output Summary (Last 24 hours) at 03/12/2020 1007 Last data filed at 03/12/2020 0823 Gross per 24 hour  Intake 3609.96 ml  Output 1905 ml  Net 1704.96 ml    Cranial nerves II through XII grossly intact. No upper or lower extremity deficits. Drain in left endarterectomy site tunneled to the neck with bloody drainage Fair amount of ecchymosis to the left neck  Laboratory Lab Results: Recent Labs    03/11/20 0139 03/11/20 0139 03/12/20 0408 03/12/20 0823  WBC 7.1  --  9.4  --   HGB 12.4*   < > 10.6* 9.5*  HCT 36.5*   < > 32.2* 28.0*  PLT 163  --  173  --    < > = values in this interval not displayed.   BMET Recent Labs    03/11/20 0139 03/11/20 0139 03/12/20 0408 03/12/20 0823  NA 140   < > 140 143  K 3.8   < > 4.1 3.7  CL 105  --  109  --   CO2 24  --  21*  --   GLUCOSE 90  --  145*  --   BUN 13  --  12  --   CREATININE 1.19  --  1.24  --   CALCIUM 9.6  --  8.8*  --    < > = values in this interval not displayed.    COAG Lab Results  Component Value Date   INR 0.9 03/11/2020   INR 0.9 03/08/2020   INR 1.02 04/29/2013   No results found for: PTT  Assessment/Planning:  74 year old male postop day 1 status post left carotid endarterectomy.  Required left neck washout this morning for recurrent hematoma.  Drain was placed.  Subsequently had reaccumulation of additional hematoma after washout with very high blood pressures on the floor.  Moved to ICU for Cleviprex drip.  Neurologically intact and very aggressively treating his blood pressure and monitoring his neck.  Have updated his wife.  Would hold Plavix at this time.  Marty Heck 03/12/2020 10:07 AM --

## 2020-03-12 NOTE — Progress Notes (Signed)
Chip, RN started cleviprex infusion per verbal order of Dr. Carlis Abbott.  Clyde Canterbury, RN

## 2020-03-12 NOTE — Anesthesia Preprocedure Evaluation (Addendum)
Anesthesia Evaluation  Patient identified by MRN, date of birth, ID band Patient awake    Reviewed: Allergy & Precautions, H&P , NPO status , Patient's Chart, lab work & pertinent test results, reviewed documented beta blocker date and time Preop documentation limited or incomplete due to emergent nature of procedure.  History of Anesthesia Complications Negative for: history of anesthetic complications  Airway Mallampati: II  TM Distance: >3 FB Neck ROM: Full    Dental  (+) Teeth Intact, Dental Advisory Given   Pulmonary former smoker,    breath sounds clear to auscultation       Cardiovascular hypertension, Pt. on medications and Pt. on home beta blockers + Peripheral Vascular Disease   Rhythm:Regular Rate:Normal  Echo 03/09/2020 1. Left ventricular ejection fraction, by estimation, is 55 to 60%. The left ventricle has normal function. The left ventricle has no regional wall motion abnormalities. Left ventricular diastolic parameters were normal.  2. Right ventricular systolic function is normal. The right ventricular size is normal.  3. Left atrial size was mildly dilated.  4. The mitral valve is normal in structure. Mild mitral valve  regurgitation. No evidence of mitral stenosis.  5. The aortic valve is tricuspid. Aortic valve regurgitation is mild. Mild aortic valve sclerosis is present, with no evidence of aortic valve stenosis.  6. The inferior vena cava is normal in size with greater than 50% respiratory variability, suggesting right atrial pressure of 3 mmHg.    Neuro/Psych CVA negative psych ROS   GI/Hepatic Neg liver ROS, GERD  Medicated and Controlled,  Endo/Other  negative endocrine ROSdiabetes  Renal/GU Renal disease     Musculoskeletal  (+) Arthritis ,   Abdominal   Peds  Hematology  (+) anemia ,   Anesthesia Other Findings   Reproductive/Obstetrics                              Anesthesia Physical  Anesthesia Plan  ASA: IV and emergent  Anesthesia Plan: General   Post-op Pain Management:    Induction: Intravenous  PONV Risk Score and Plan: 3 and Ondansetron, Treatment may vary due to age or medical condition and Dexamethasone  Airway Management Planned: Oral ETT  Additional Equipment: Arterial line  Intra-op Plan:   Post-operative Plan: Extubation in OR and Possible Post-op intubation/ventilation  Informed Consent: I have reviewed the patients History and Physical, chart, labs and discussed the procedure including the risks, benefits and alternatives for the proposed anesthesia with the patient or authorized representative who has indicated his/her understanding and acceptance.     Dental advisory given and Only emergency history available  Plan Discussed with: CRNA, Anesthesiologist and Surgeon  Anesthesia Plan Comments: (Remi gtt)      Anesthesia Quick Evaluation

## 2020-03-12 NOTE — Progress Notes (Signed)
75 year old male postop day 1 status post left carotid endarterectomy.  I was just called that he suddenly developed a very large left neck hematoma.  On evaluation he does have very large tense hematoma in his neck.  I posted him emergently for washout of left neck hematoma.  Marty Heck, MD [facility] Office: [place]

## 2020-03-13 ENCOUNTER — Inpatient Hospital Stay (HOSPITAL_COMMUNITY): Payer: Medicare PPO

## 2020-03-13 DIAGNOSIS — R05 Cough: Secondary | ICD-10-CM

## 2020-03-13 LAB — COMPREHENSIVE METABOLIC PANEL
ALT: 12 U/L (ref 0–44)
AST: 15 U/L (ref 15–41)
Albumin: 2.8 g/dL — ABNORMAL LOW (ref 3.5–5.0)
Alkaline Phosphatase: 80 U/L (ref 38–126)
Anion gap: 7 (ref 5–15)
BUN: 8 mg/dL (ref 8–23)
CO2: 24 mmol/L (ref 22–32)
Calcium: 8.4 mg/dL — ABNORMAL LOW (ref 8.9–10.3)
Chloride: 109 mmol/L (ref 98–111)
Creatinine, Ser: 1.13 mg/dL (ref 0.61–1.24)
GFR calc Af Amer: >60 mL/min (ref >60)
GFR calc non Af Amer: >60 mL/min (ref >60)
Glucose, Bld: 142 mg/dL — ABNORMAL HIGH (ref 70–99)
Potassium: 3.7 mmol/L (ref 3.5–5.1)
Sodium: 140 mmol/L (ref 135–145)
Total Bilirubin: 0.5 mg/dL (ref 0.3–1.2)
Total Protein: 5.1 g/dL — ABNORMAL LOW (ref 6.5–8.1)

## 2020-03-13 LAB — GLUCOSE, CAPILLARY
Glucose-Capillary: 124 mg/dL — ABNORMAL HIGH (ref 70–99)
Glucose-Capillary: 120 mg/dL — ABNORMAL HIGH (ref 70–99)
Glucose-Capillary: 117 mg/dL — ABNORMAL HIGH (ref 70–99)
Glucose-Capillary: 118 mg/dL — ABNORMAL HIGH (ref 70–99)

## 2020-03-13 LAB — CBC
HCT: 27.7 % — ABNORMAL LOW (ref 39.0–52.0)
Hemoglobin: 8.9 g/dL — ABNORMAL LOW (ref 13.0–17.0)
MCH: 30.6 pg (ref 26.0–34.0)
MCHC: 32.1 g/dL (ref 30.0–36.0)
MCV: 95.2 fL (ref 80.0–100.0)
Platelets: 191 10*3/uL (ref 150–400)
RBC: 2.91 MIL/uL — ABNORMAL LOW (ref 4.22–5.81)
RDW: 13.5 % (ref 11.5–15.5)
WBC: 9.5 10*3/uL (ref 4.0–10.5)
nRBC: 0 % (ref 0.0–0.2)

## 2020-03-13 IMAGING — DX DG CHEST 1V PORT
1 series · 1 of 1 positions shown · non-contrast
Comparison: [DATE] and earlier.

CLINICAL DATA: Postop day 2 carotid endarterectomy. Follow-up RIGHT
UPPER LOBE atelectasis.

EXAM:
PORTABLE CHEST 1 VIEW

[chest ap]
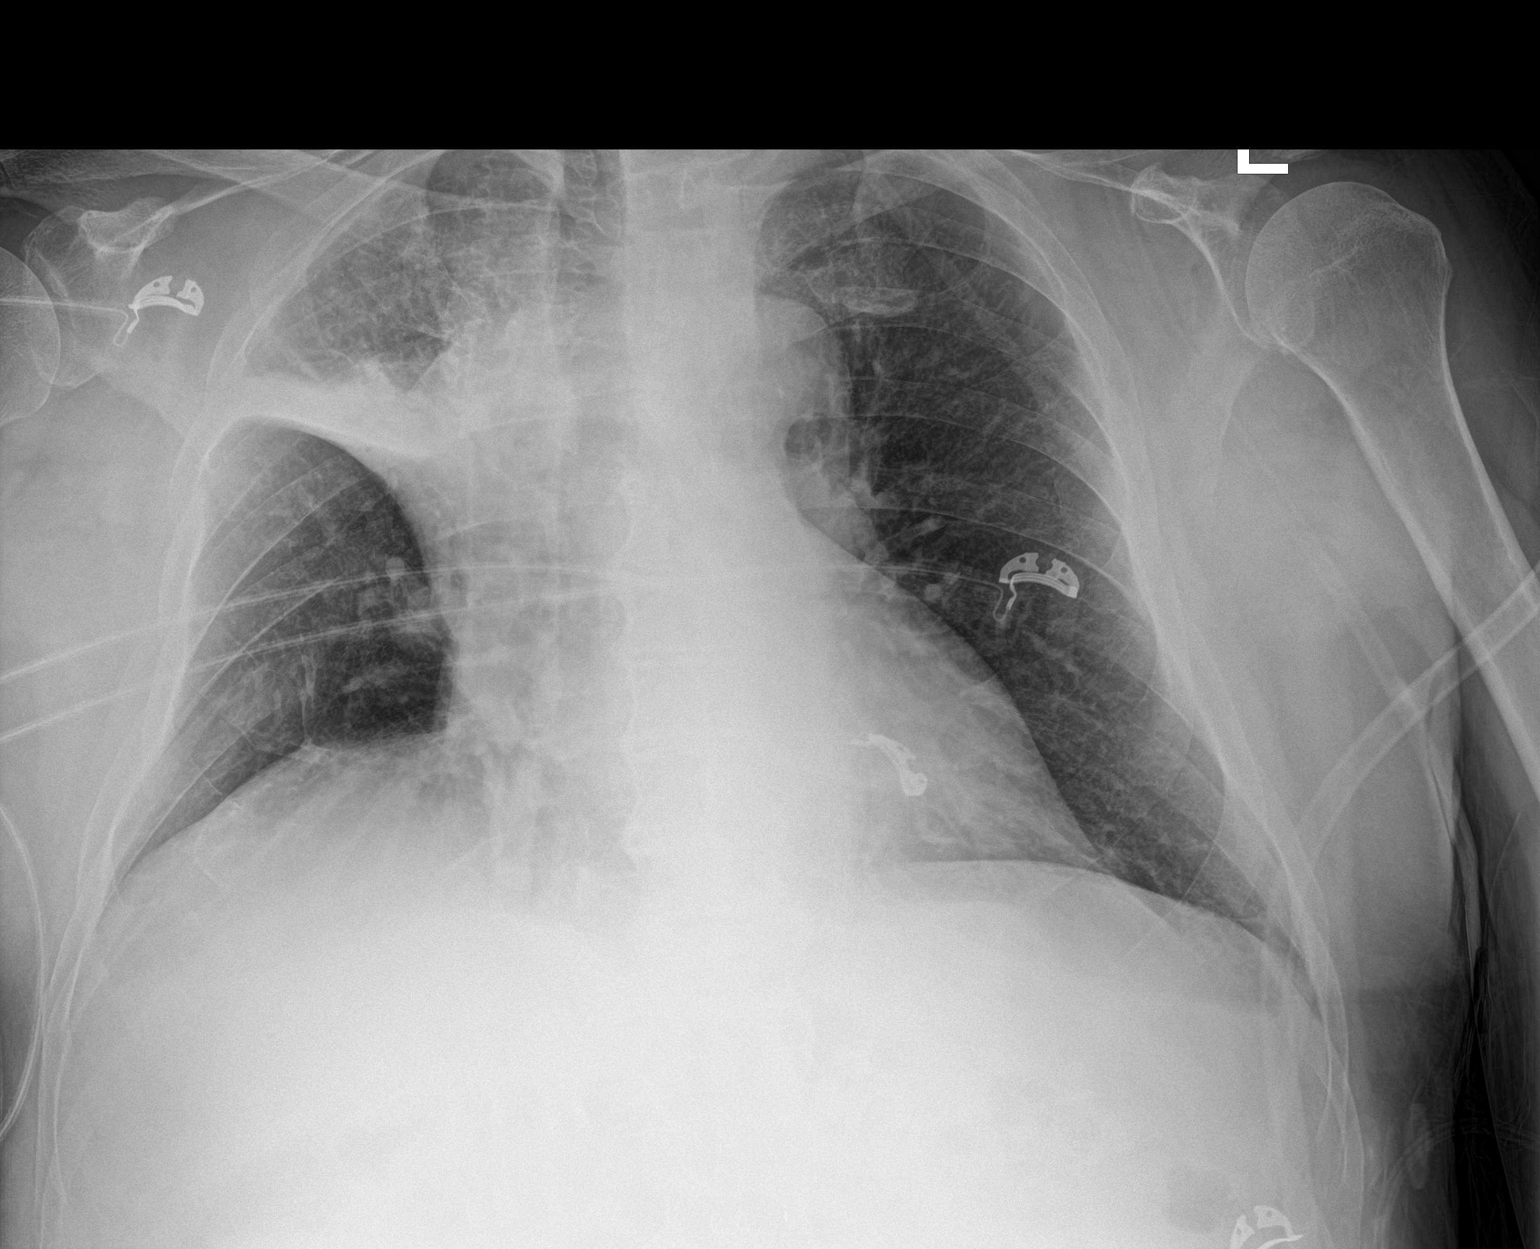

[1 of 1 positions shown; findings below may reference images not displayed]

FINDINGS: Cardiac silhouette normal in size for AP portable technique.
Improved aeration in the RIGHT UPPER LOBE since yesterday, though
atelectasis persists. Stable scarring at the RIGHT lung base. No new
pulmonary parenchymal abnormalities.
IMPRESSION: Improved aeration in the RIGHT UPPER LOBE since yesterday, though
atelectasis persists. No new abnormalities.

## 2020-03-13 MED ORDER — METOPROLOL TARTRATE 50 MG PO TABS
50.0000 mg | ORAL_TABLET | Freq: Two times a day (BID) | ORAL | Status: DC
  Administered 2020-03-13 – 2020-03-16 (×7): 50 mg via ORAL
  Filled 2020-03-13 (×7): qty 1

## 2020-03-13 MED ORDER — PANTOPRAZOLE SODIUM 40 MG PO TBEC
40.0000 mg | DELAYED_RELEASE_TABLET | Freq: Every day | ORAL | Status: DC
  Administered 2020-03-14 – 2020-03-23 (×10): 40 mg via ORAL
  Filled 2020-03-13 (×11): qty 1

## 2020-03-13 MED ORDER — CITALOPRAM HYDROBROMIDE 20 MG PO TABS
20.0000 mg | ORAL_TABLET | Freq: Every day | ORAL | Status: DC
  Administered 2020-03-13 – 2020-03-23 (×11): 20 mg via ORAL
  Filled 2020-03-13 (×12): qty 1

## 2020-03-13 MED ORDER — PROMETHAZINE HCL 25 MG/ML IJ SOLN: 12.5000 mg | Freq: Four times a day (QID) | INTRAMUSCULAR | Status: DC | PRN

## 2020-03-13 MED ORDER — ROSUVASTATIN CALCIUM 20 MG PO TABS
40.0000 mg | ORAL_TABLET | Freq: Every day | ORAL | Status: DC
  Administered 2020-03-13 – 2020-03-15 (×3): 40 mg via ORAL
  Filled 2020-03-13 (×3): qty 2

## 2020-03-13 MED ORDER — MEMANTINE HCL 10 MG PO TABS
10.0000 mg | ORAL_TABLET | Freq: Two times a day (BID) | ORAL | Status: DC
  Administered 2020-03-13 – 2020-03-23 (×21): 10 mg via ORAL
  Filled 2020-03-13 (×21): qty 1

## 2020-03-13 MED ORDER — OXYCODONE-ACETAMINOPHEN 5-325 MG PO TABS: 1.0000 | ORAL_TABLET | ORAL | Status: DC | PRN

## 2020-03-13 MED ORDER — DONEPEZIL HCL 5 MG PO TABS
10.0000 mg | ORAL_TABLET | Freq: Every day | ORAL | Status: DC
  Administered 2020-03-13 – 2020-03-22 (×10): 10 mg via ORAL
  Filled 2020-03-13: qty 1
  Filled 2020-03-13 (×2): qty 2
  Filled 2020-03-13: qty 1
  Filled 2020-03-13: qty 2
  Filled 2020-03-13: qty 1
  Filled 2020-03-13: qty 2
  Filled 2020-03-13 (×3): qty 1

## 2020-03-13 MED ORDER — THIAMINE HCL 100 MG/ML IJ SOLN: 100.0000 mg | Freq: Every day | INTRAMUSCULAR | Status: DC

## 2020-03-13 MED ORDER — VITAMIN B-12 1000 MCG PO TABS
1000.0000 ug | ORAL_TABLET | Freq: Every day | ORAL | Status: DC
  Administered 2020-03-14 – 2020-03-23 (×10): 1000 ug via ORAL
  Filled 2020-03-13 (×11): qty 1

## 2020-03-13 NOTE — Progress Notes (Signed)
NAME:  [name] [name], MRN:  [id number], DOB:  April 16, 1946, LOS: 4 ADMISSION DATE:  03/08/2020, CONSULTATION DATE:  03/13/2020  REFERRING MD:  Wyline Copas, triad, CHIEF COMPLAINT:  resp distress   Brief History   74 year old man admitted 9/7 with  left frontal and occipital CVA , underwent left CEA 9/10 , required left neck washout for hematoma.  Transferred to ICU for blood pressure management developed hypoxia postop with right upper lobe atelectasis, hence PCCM consulted   Past Medical History  Diabetes type 2 Hypertension Hyperlipidemia  Significant Hospital Events   9/10 left CEA 9/11 transferred to ICU post evacuation of left hematoma for blood pressure management  Consults:  Vascular  Procedures:    Significant Diagnostic Tests:  MRI brain 9/7 small acute infarcts left frontal and occipital lobes, moderate cerebral atrophy, sequelae of remote bilateral basal ganglia thalamic and cerebellar insults MRI cervical and thoracic spine 9/7>> mild cervical spondylosis  CT angiogram neck 9/7 >> 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 30% luminal narrowing of the innominate secondary to atheromatous plaque.  Approximately 50% narrowing of the left carotid bulb and 80-90% narrowing of the proximal left ICA secondary to atheromatous plaque.  Micro Data:    Antimicrobials:     Interim history/subjective:   Remains critically ill, on Ventimask Breathing "better" 100 cc drain via JP  Objective   Blood pressure (!) 124/53, pulse 77, temperature 98.4 F (36.9 C), resp. rate 13, height 5\' 9"  (1.753 m), weight 84.1 kg, SpO2 94 %.    FiO2 (%):  [50 %] 50 %   Intake/Output Summary (Last 24 hours) at 03/13/2020 0847 Last data filed at 03/13/2020 0600 Gross per 24 hour  Intake 3282.68 ml  Output 1840 ml  Net 1442.68 ml   Filed Weights   03/09/20 0201  Weight: 84.1 kg    Examination: General: Elderly man, no distress, on Ventimask HENT: Mild  pallor, no icterus, left neck hematoma appears soft, JP drain bloody, Lungs: No accessory muscle use, able to speak in full sentences, improved breath sounds on right, clear on left Cardiovascular: S1-S2 regular Abdomen: Soft nontender abdomen Extremities: No rash, no edema Neuro: Awake, alert, interactive, power 4+/5 all 4 extremities, nonfocal  Able to move 1500 cc on incentive spirometer Chest x-ray 9/12 independently reviewed shows improved aeration right upper lobe  Labs show resolved leukocytosis, slight drop in hemoglobin from 10-9, normal electrolytes  Resolved Hospital Problem list     Assessment & Plan:  Acute respiratory failure related to right upper lobe atelectasis likely from aspirated blood due to airway bleeding,?  Traumatic intubation for emergent surgery Work of breathing is acceptable, oxygen requirements decreasing  -Continue incentive spirometry -Hypertonic saline nebs -Mobilize in a chair or upright -No evidence of impending airway obstruction, hematoma appears decreased & softer , reviewed with vascular at bedside -With improved aeration on chest x-ray does not need bronchoscopy  Hypertensive urgency -Cleviprex drip for aggressive blood pressure control -Add metoprolol 50 twice daily  Acute CVA Hematoma post left CEA -aspirin and Plavix on hold -Mobilize per PT   Best practice:  Diet: NPO Pain/Anxiety/Delirium protocol (if indicated): N/A VAP protocol (if indicated): N/A DVT prophylaxis: SCDs GI prophylaxis: Protonix Glucose control: SSI Mobility: Bedrest Code Status: Full Family Communication: per primary Disposition: ICU  Labs   CBC: Recent Labs  Lab 03/08/20 1613 03/09/20 1000 03/10/20 0423 03/10/20 0423 03/11/20 0139 03/12/20 0408 03/12/20 0823 03/12/20 1130 03/13/20 0500  WBC 12.7*   < >  8.5  --  7.1 9.4  --  12.7* 9.5  NEUTROABS 10.6*  --  5.8  --  3.9  --   --   --   --   HGB 14.0   < > 12.4*   < > 12.4* 10.6* 9.5* 10.0* 8.9*    HCT 41.7   < > 37.6*   < > 36.5* 32.2* 28.0* 30.7* 27.7*  MCV 91.0   < > 91.9  --  91.0 91.0  --  94.5 95.2  PLT 187   < > 148*  --  163 173  --  201 191   < > = values in this interval not displayed.    Basic Metabolic Panel: Recent Labs  Lab 03/08/20 1613 03/09/20 0338 03/09/20 1000 03/09/20 1000 03/10/20 0423 03/11/20 0139 03/12/20 0408 03/12/20 0823 03/13/20 0500  NA   < >  --  138   < > 140 140 140 143 140  K   < >  --  3.4*   < > 3.4* 3.8 4.1 3.7 3.7  CL   < >  --  105  --  107 105 109  --  109  CO2   < >  --  23  --  24 24 21*  --  24  GLUCOSE   < >  --  203*  --  106* 90 145*  --  142*  BUN   < >  --  14  --  12 13 12   --  8  CREATININE   < >  --  1.33*  --  1.20 1.19 1.24  --  1.13  CALCIUM   < >  --  9.1  --  9.3 9.6 8.8*  --  8.4*  MG  --  2.2  --   --  2.0 2.0  --   --   --   PHOS  --   --   --   --  2.7 3.4  --   --   --    < > = values in this interval not displayed.   GFR: Estimated Creatinine Clearance: 57.4 mL/min (by C-G formula based on SCr of 1.13 mg/dL). Recent Labs  Lab 03/11/20 0139 03/12/20 0408 03/12/20 1130 03/13/20 0500  WBC 7.1 9.4 12.7* 9.5     Kara Mead MD. FCCP. [facility]  If no response to pager , please call [phone]  After 7:00 pm call Elink  [phone]   03/13/2020

## 2020-03-13 NOTE — Progress Notes (Signed)
[facility]  Subjective  -neck hematoma is softer today. Drain in place with 100 mL output in the last 24 hours. Remains on max dose of Cleviprex drip. Neurologically intact. States his pain is improving.   Objective (!) 116/50 89 98.4 F (36.9 C) 13 94%  Intake/Output Summary (Last 24 hours) at 03/13/2020 1015 Last data filed at 03/13/2020 0600 Gross per 24 hour  Intake 3282.68 ml  Output 1840 ml  Net 1442.68 ml    Cranial nerves II through XII grossly intact. No upper or lower extremity deficits. Drain in left endarterectomy site tunneled to the neck with drainage that is clearer today Fair amount of ecchymosis to the left neck with some recurrent hematoma  Laboratory Lab Results: Recent Labs    03/12/20 1130 03/13/20 0500  WBC 12.7* 9.5  HGB 10.0* 8.9*  HCT 30.7* 27.7*  PLT 201 191   BMET Recent Labs    03/12/20 0408 03/12/20 0408 03/12/20 0823 03/13/20 0500  NA 140   < > 143 140  K 4.1   < > 3.7 3.7  CL 109  --   --  109  CO2 21*  --   --  24  GLUCOSE 145*  --   --  142*  BUN 12  --   --  8  CREATININE 1.24  --   --  1.13  CALCIUM 8.8*  --   --  8.4*   < > = values in this interval not displayed.    COAG Lab Results  Component Value Date   INR 0.9 03/11/2020   INR 0.9 03/08/2020   INR 1.02 04/29/2013   No results found for: PTT  Assessment/Planning:  74 year old male postop day 2 status post left carotid endarterectomy. He required washout of a left neck hematoma yesterday morning. When he got to the floor after surgery he had profound volatile blood pressure with systolics over 341 and reaccumulated another neck hematoma. Moved to the ICU on a Cleviprex drip that has been maxed out. Neck hematomas softer today. Voice is improving. Drain put out 100 mL in the last 24 hours. We will leave drain today. I think he aspirated some blood from a traumatic intubation and had atelectasis of the right upper lobe. That is now  improved on x-ray. Pulling 1500 mL on incentive spirometer. Appreciate medicine/critical care input and will need to get him on oral blood pressure management and wean Cleviprex. Holding his Plavix and when resume anticoagulation would just do aspirin  Marty Heck, MD [facility] Office: [phone]    .Marty Heck 03/13/2020 10:15 AM --

## 2020-03-13 NOTE — Progress Notes (Signed)
PROGRESS NOTE    [name] [name]  TMH:[id number] DOB: 05-21-1946 DOA: 03/08/2020 PCP: [name], MD    Brief Narrative:  74 y.o.malewith medical history significant formemory loss, chronic kidney disease, type 2 diabetes mellitus, AAA, hypertension, and hyperlipidemia, now presenting to the emergency department for evaluation of confusion, hypoglycemia, and a fall.  MRI showed stroke involving the L frontal and ocipital lobes.  He was noted to have L carotid artery disease and vascular surgery was c/s for CEA.  S/p CEA on 9/10. Developed large post-procedural hematoma requiring evacuation of hematoma  Assessment & Plan:   Principal Problem:   Acute ischemic stroke [facility]) Active Problems:   Diabetes mellitus type II, non insulin dependent ([place])   Memory loss   Renal insufficiency   Hypertension   Stroke ([place])   1.Acute ischemic CVA -Presents with speech difficulty, confusion, and a fall and is found to have acute ischemic infarctions on MRI involving left frontal and occipital lobes - CTA head/neck with extensive atheromatous disease of the aorta and branch vessels.  30% luminal narrowing of innominate 2/2 atheromatous plaque.  50% narrowing of L carotid bulb and 80-90% narrowing of proximal L ICA 2/2 atheromaous plaque.  Mild R V1 and V4 segment narrowing 2/2 atheromatous diseae.  Mild narrowing of proximal R petrous and L paraclinoid ICA. - carotid US notable for L ICA velocities consistent with 60-79% stenosis, PSV at bulb suggest 80-99% stenosis.  -Neurology, recommended crestor 40 mg, vascular surgery consult -> he's now s/p L CEA on 9/10 by vascular, see below - echo with EF 55-60%, no RWMA (see report) -Continue cardiac monitoring, frequent neuro checks -PT/OT/SLP eval -> OT recommending outpatient OT, 24 hr supervision/assistance, 3 in 1 bedside commode.  Outpatient SLP.  No PT follow up.   - LDL 95, a1c 6.8  - Following development of recent hematoma, discussed case  with stroke MD who recommends holding both ASA and plavix and when cleared to resume antiplatelet by vascular surgery, to resume ASA alone  # Carotid Artery Stenosis: s/p L CEA 9/10. With development of large post-procedural hematoma, required evacuation and placement of drainage catheter. Vascular surgery following. Concern for bleed secondary to recent uncontrolled BP, see below. Continued on Cleviprex gtt per vascular with beta blocker  2.Type II DM - on glimepiride at home, currently on hold - A1c 6.8 - continue SSI as needed  3.Renal insufficiency -SCr is 1.46 on admission - creatinine 1.24 today, continue to follow -He has CKD on problem list but most recent prior labs available are from 2014 when SCr was 0.9 -Renally-dose medications, monitor  4.Hypertension - Had initially allowed permissive HTN -BP suboptimally controlled overnight, likely precipitated acute blood loss post-CEA -Pt now on Cleviprex gtt per Vascular surgery with much improved BP -Plan to ultimately wean off Cleviprex, continued on metoprolol. Cont to titrate bp meds with goal of weaning off cleviprex  5.Hyperlipidemia -Diet resumed, thus will resume crestor -LDL of 95  6.Memory loss Etoh Abuse:  -Followed by neurology, treated with Aricept and Namenda - thiamine level (elevated) - on high dose thiamine x 3 days, then 100 mg daily - follow B12 (low normal, follow MMA normal) - continue folate supplementation - CIWA  7.OSA -Patient reports CPAP intolerance  # Cough: x2-3 weeks, follow CXR - follow outpatient - CXR with mild peribronchial thickening, consider discharge with inhaler  9. Acute blood loss anemia -secondary to L neck hematoma following CEA, likely related to poorly controlled BP -Recheck cbc in AM  10. R upper lobe atelectasis -Recently hypoxemic requiring up to venturi mask -CXR personally reviewed with findings of dense RUL atelectasis -Appreciate  assistance by Pulmonary. Findings likely secondary to blood during intubation -This AM, clinically much improved, still requiring 10LNC -Continue to wean O2 as tolerated  DVT prophylaxis: SCD's Code Status: Full Family Communication: Pt in room, family not at bedside  Status is: Inpatient  Remains inpatient appropriate because:Hemodynamically unstable, Ongoing diagnostic testing needed not appropriate for outpatient work up, Unsafe d/c plan, IV treatments appropriate due to intensity of illness or inability to take PO and Inpatient level of care appropriate due to severity of illness   Dispo: The patient is from: Home              Anticipated d/c is to: Home              Anticipated d/c date is: > 3 days              Patient currently is not medically stable to d/c.       Consultants:   Neurology  Vascular surgery  Pulmonary  Procedures:   CEA 9/10  Evacuation of L neck hematoma 9/11  Antimicrobials: Anti-infectives (From admission, onward)   Start     Dose/Rate Route Frequency Ordered Stop   03/11/20 1800  ceFAZolin (ANCEF) IVPB 2g/100 mL premix        2 g 200 mL/hr over 30 Minutes Intravenous Every 8 hours 03/11/20 1740 03/12/20 0138      Subjective: Reports feeling and breathing better this AM.  Objective: Vitals:   03/13/20 1230 03/13/20 1300 03/13/20 1330 03/13/20 1349  BP: 117/63 (!) 123/53 (!) 107/54   Pulse: 92 92 89   Resp: 11 (!) 8 12   Temp:      TempSrc:      SpO2: 97% 98% 93% 98%  Weight:      Height:        Intake/Output Summary (Last 24 hours) at 03/13/2020 1425 Last data filed at 03/13/2020 1104 Gross per 24 hour  Intake 2730.86 ml  Output 2210 ml  Net 520.86 ml   Filed Weights   03/09/20 0201  Weight: 84.1 kg    Examination: General exam: Awake, laying in bed, in nad, L neck still swollen, much improved since yesterday Respiratory system: Normal respiratory effort, no wheezing Cardiovascular system: regular rate, s1,  s2 Gastrointestinal system: Soft, nondistended, positive BS Central nervous system: CN2-12 grossly intact, strength intact Extremities: Perfused, no clubbing Skin: Normal skin turgor, no notable skin lesions seen Psychiatry: Mood normal // no visual hallucinations    Data Reviewed: I have personally reviewed following labs and imaging studies  CBC: Recent Labs  Lab 03/08/20 1613 03/09/20 1000 03/10/20 0423 03/10/20 0423 03/11/20 0139 03/12/20 0408 03/12/20 0823 03/12/20 1130 03/13/20 0500  WBC 12.7*   < > 8.5  --  7.1 9.4  --  12.7* 9.5  NEUTROABS 10.6*  --  5.8  --  3.9  --   --   --   --   HGB 14.0   < > 12.4*   < > 12.4* 10.6* 9.5* 10.0* 8.9*  HCT 41.7   < > 37.6*   < > 36.5* 32.2* 28.0* 30.7* 27.7*  MCV 91.0   < > 91.9  --  91.0 91.0  --  94.5 95.2  PLT 187   < > 148*  --  163 173  --  201 191   < > =  values in this interval not displayed.   Basic Metabolic Panel: Recent Labs  Lab 03/08/20 1613 03/09/20 0338 03/09/20 1000 03/09/20 1000 03/10/20 0423 03/11/20 0139 03/12/20 0408 03/12/20 0823 03/13/20 0500  NA   < >  --  138   < > 140 140 140 143 140  K   < >  --  3.4*   < > 3.4* 3.8 4.1 3.7 3.7  CL   < >  --  105  --  107 105 109  --  109  CO2   < >  --  23  --  24 24 21*  --  24  GLUCOSE   < >  --  203*  --  106* 90 145*  --  142*  BUN   < >  --  14  --  12 13 12   --  8  CREATININE   < >  --  1.33*  --  1.20 1.19 1.24  --  1.13  CALCIUM   < >  --  9.1  --  9.3 9.6 8.8*  --  8.4*  MG  --  2.2  --   --  2.0 2.0  --   --   --   PHOS  --   --   --   --  2.7 3.4  --   --   --    < > = values in this interval not displayed.   GFR: Estimated Creatinine Clearance: 57.4 mL/min (by C-G formula based on SCr of 1.13 mg/dL). Liver Function Tests: Recent Labs  Lab 03/08/20 1613 03/09/20 1000 03/10/20 0423 03/11/20 0139 03/13/20 0500  AST 39 34 31 26 15   ALT 30 29 33 33 12  ALKPHOS 120 101 98 116 80  BILITOT 0.7 1.1 1.0 0.9 0.5  PROT 6.7 5.9* 6.3* 6.1* 5.1*   ALBUMIN 3.9 3.3* 3.4* 3.1* 2.8*   Recent Labs  Lab 03/08/20 1613  LIPASE 28   No results for input(s): AMMONIA in the last 168 hours. Coagulation Profile: Recent Labs  Lab 03/08/20 1613 03/11/20 0139  INR 0.9 0.9   Cardiac Enzymes: No results for input(s): CKTOTAL, CKMB, CKMBINDEX, TROPONINI in the last 168 hours. BNP (last 3 results) No results for input(s): PROBNP in the last 8760 hours. HbA1C: No results for input(s): HGBA1C in the last 72 hours. CBG: Recent Labs  Lab 03/12/20 1128 03/12/20 1541 03/12/20 2135 03/13/20 0636 03/13/20 1111  GLUCAP 129* 132* 131* 124* 120*   Lipid Profile: No results for input(s): CHOL, HDL, LDLCALC, TRIG, CHOLHDL, LDLDIRECT in the last 72 hours. Thyroid Function Tests: No results for input(s): TSH, T4TOTAL, FREET4, T3FREE, THYROIDAB in the last 72 hours. Anemia Panel: No results for input(s): VITAMINB12, FOLATE, FERRITIN, TIBC, IRON, RETICCTPCT in the last 72 hours. Sepsis Labs: No results for input(s): PROCALCITON, LATICACIDVEN in the last 168 hours.  Recent Results (from the past 240 hour(s))  SARS Coronavirus 2 by RT PCR (hospital order, performed in [facility] hospital lab) Nasopharyngeal Nasopharyngeal Swab     Status: None   Collection Time: 03/08/20  5:03 PM   Specimen: Nasopharyngeal Swab  Result Value Ref Range Status   SARS Coronavirus 2 NEGATIVE NEGATIVE Final    Comment: (NOTE) SARS-CoV-2 target nucleic acids are NOT DETECTED.  The SARS-CoV-2 RNA is generally detectable in upper and lower respiratory specimens during the acute phase of infection. The lowest concentration of SARS-CoV-2 viral copies this assay can detect is 250 copies / mL.  A negative result does not preclude SARS-CoV-2 infection and should not be used as the sole basis for treatment or other patient management decisions.  A negative result may occur with improper specimen collection / handling, submission of specimen other than nasopharyngeal  swab, presence of viral mutation(s) within the areas targeted by this assay, and inadequate number of viral copies (<250 copies / mL). A negative result must be combined with clinical observations, patient history, and epidemiological information.  Fact Sheet for Patients:   StrictlyIdeas.no  Fact Sheet for Healthcare Providers: BankingDealers.co.za  This test is not yet approved or  cleared by the [place] FDA and has been authorized for detection and/or diagnosis of SARS-CoV-2 by FDA under an Emergency Use Authorization (EUA).  This EUA will remain in effect (meaning this test can be used) for the duration of the COVID-19 declaration under Section 564(b)(1) of the Act, 21 U.S.C. section 360bbb-3(b)(1), unless the authorization is terminated or revoked sooner.  Performed at [facility], [place] [address], [place], [place] [address]   Culture, Urine     Status: None   Collection Time: 03/08/20 10:21 PM   Specimen: Urine, Random  Result Value Ref Range Status   Specimen Description URINE, RANDOM  Final   Special Requests NONE  Final   Culture   Final    NO GROWTH Performed at [facility], [place] [address] [place], [place] [address]    Report Status 03/09/2020 FINAL  Final  MRSA PCR Screening     Status: None   Collection Time: 03/11/20  6:34 AM   Specimen: Nasopharyngeal  Result Value Ref Range Status   MRSA by PCR NEGATIVE NEGATIVE Final    Comment:        The GeneXpert MRSA Assay (FDA approved for NASAL specimens only), is one component of a comprehensive MRSA colonization surveillance program. It is not intended to diagnose MRSA infection nor to guide or monitor treatment for MRSA infections. Performed at [facility], [place] [address] [place], [place] [address]      Radiology Studies: DG Chest Port 1 View  Result Date: 03/13/2020 CLINICAL DATA:  Postop day 2 carotid endarterectomy. Follow-up  RIGHT UPPER LOBE atelectasis. EXAM: PORTABLE CHEST 1 VIEW COMPARISON:  03/12/2020 and earlier. FINDINGS: Cardiac silhouette normal in size for AP portable technique. Improved aeration in the RIGHT UPPER LOBE since yesterday, though atelectasis persists. Stable scarring at the RIGHT lung base. No new pulmonary parenchymal abnormalities. IMPRESSION: Improved aeration in the RIGHT UPPER LOBE since yesterday, though atelectasis persists. No new abnormalities. Electronically Signed   By: Evangeline Dakin M.D.   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 EXAM: PORTABLE CHEST 1 VIEW COMPARISON:  03/10/2020 and earlier, including CT chest 01/28/2018. FINDINGS: Cardiac silhouette mildly enlarged for the AP portable technique and the patient's suboptimal inspiration. Complete dense atelectasis of the RIGHT UPPER LOBE. Mild atelectasis at the RIGHT lung base. LEFT lung remains clear. Pulmonary vascularity normal. No visible pleural effusions. IMPRESSION: 1. Complete dense atelectasis of the RIGHT UPPER LOBE. Mild atelectasis at the RIGHT lung base. 2. Mild cardiomegaly without pulmonary edema. Electronically Signed   By: Evangeline Dakin M.D.   On: 03/12/2020 16:16    Scheduled Meds: . Chlorhexidine Gluconate Cloth  6 each Topical Daily  . folic acid  1 mg Intravenous  Daily  . insulin aspart  0-5 Units Subcutaneous QHS  . insulin aspart  0-9 Units Subcutaneous TID WC  . metoprolol tartrate  50 mg Oral BID  . pantoprazole (PROTONIX) IV  40 mg Intravenous Q24H  . sodium chloride HYPERTONIC  4 mL Nebulization TID   Continuous Infusions: . sodium chloride Stopped (03/11/20 0532)  . sodium chloride    . sodium chloride 50 mL/hr at 03/13/20 1013  . clevidipine 17 mg/hr (03/13/20 1104)  . magnesium sulfate bolus IVPB       LOS: 4 days   Marylu Lund, MD [facility] Pager On Amion  If 7PM-7AM, please contact night-coverage 03/13/2020, 2:25 PM

## 2020-03-14 ENCOUNTER — Encounter (HOSPITAL_COMMUNITY): Payer: Self-pay | Admitting: Vascular Surgery

## 2020-03-14 ENCOUNTER — Inpatient Hospital Stay (HOSPITAL_COMMUNITY): Payer: Medicare PPO

## 2020-03-14 LAB — CBC
HCT: 27.9 % — ABNORMAL LOW (ref 39.0–52.0)
Hemoglobin: 9 g/dL — ABNORMAL LOW (ref 13.0–17.0)
MCH: 31.1 pg (ref 26.0–34.0)
MCHC: 32.3 g/dL (ref 30.0–36.0)
MCV: 96.5 fL (ref 80.0–100.0)
Platelets: 195 10*3/uL (ref 150–400)
RBC: 2.89 MIL/uL — ABNORMAL LOW (ref 4.22–5.81)
RDW: 13.5 % (ref 11.5–15.5)
WBC: 10.2 10*3/uL (ref 4.0–10.5)
nRBC: 0 % (ref 0.0–0.2)

## 2020-03-14 LAB — BASIC METABOLIC PANEL
Anion gap: 7 (ref 5–15)
BUN: 8 mg/dL (ref 8–23)
CO2: 25 mmol/L (ref 22–32)
Calcium: 8.5 mg/dL — ABNORMAL LOW (ref 8.9–10.3)
Chloride: 110 mmol/L (ref 98–111)
Creatinine, Ser: 1.02 mg/dL (ref 0.61–1.24)
GFR calc Af Amer: >60 mL/min (ref >60)
GFR calc non Af Amer: >60 mL/min (ref >60)
Glucose, Bld: 119 mg/dL — ABNORMAL HIGH (ref 70–99)
Potassium: 3.5 mmol/L (ref 3.5–5.1)
Sodium: 142 mmol/L (ref 135–145)

## 2020-03-14 LAB — TRIGLYCERIDES: Triglycerides: 144 mg/dL (ref <150)

## 2020-03-14 LAB — GLUCOSE, CAPILLARY
Glucose-Capillary: 107 mg/dL — ABNORMAL HIGH (ref 70–99)
Glucose-Capillary: 125 mg/dL — ABNORMAL HIGH (ref 70–99)
Glucose-Capillary: 107 mg/dL — ABNORMAL HIGH (ref 70–99)
Glucose-Capillary: 108 mg/dL — ABNORMAL HIGH (ref 70–99)

## 2020-03-14 LAB — MAGNESIUM: Magnesium: 1.9 mg/dL (ref 1.7–2.4)

## 2020-03-14 IMAGING — DX DG CHEST 1V PORT
1 series · 1 of 1 positions shown · non-contrast
Comparison: [DATE]

CLINICAL DATA: Acute respiratory failure.

EXAM:
PORTABLE CHEST 1 VIEW

[chest ap]
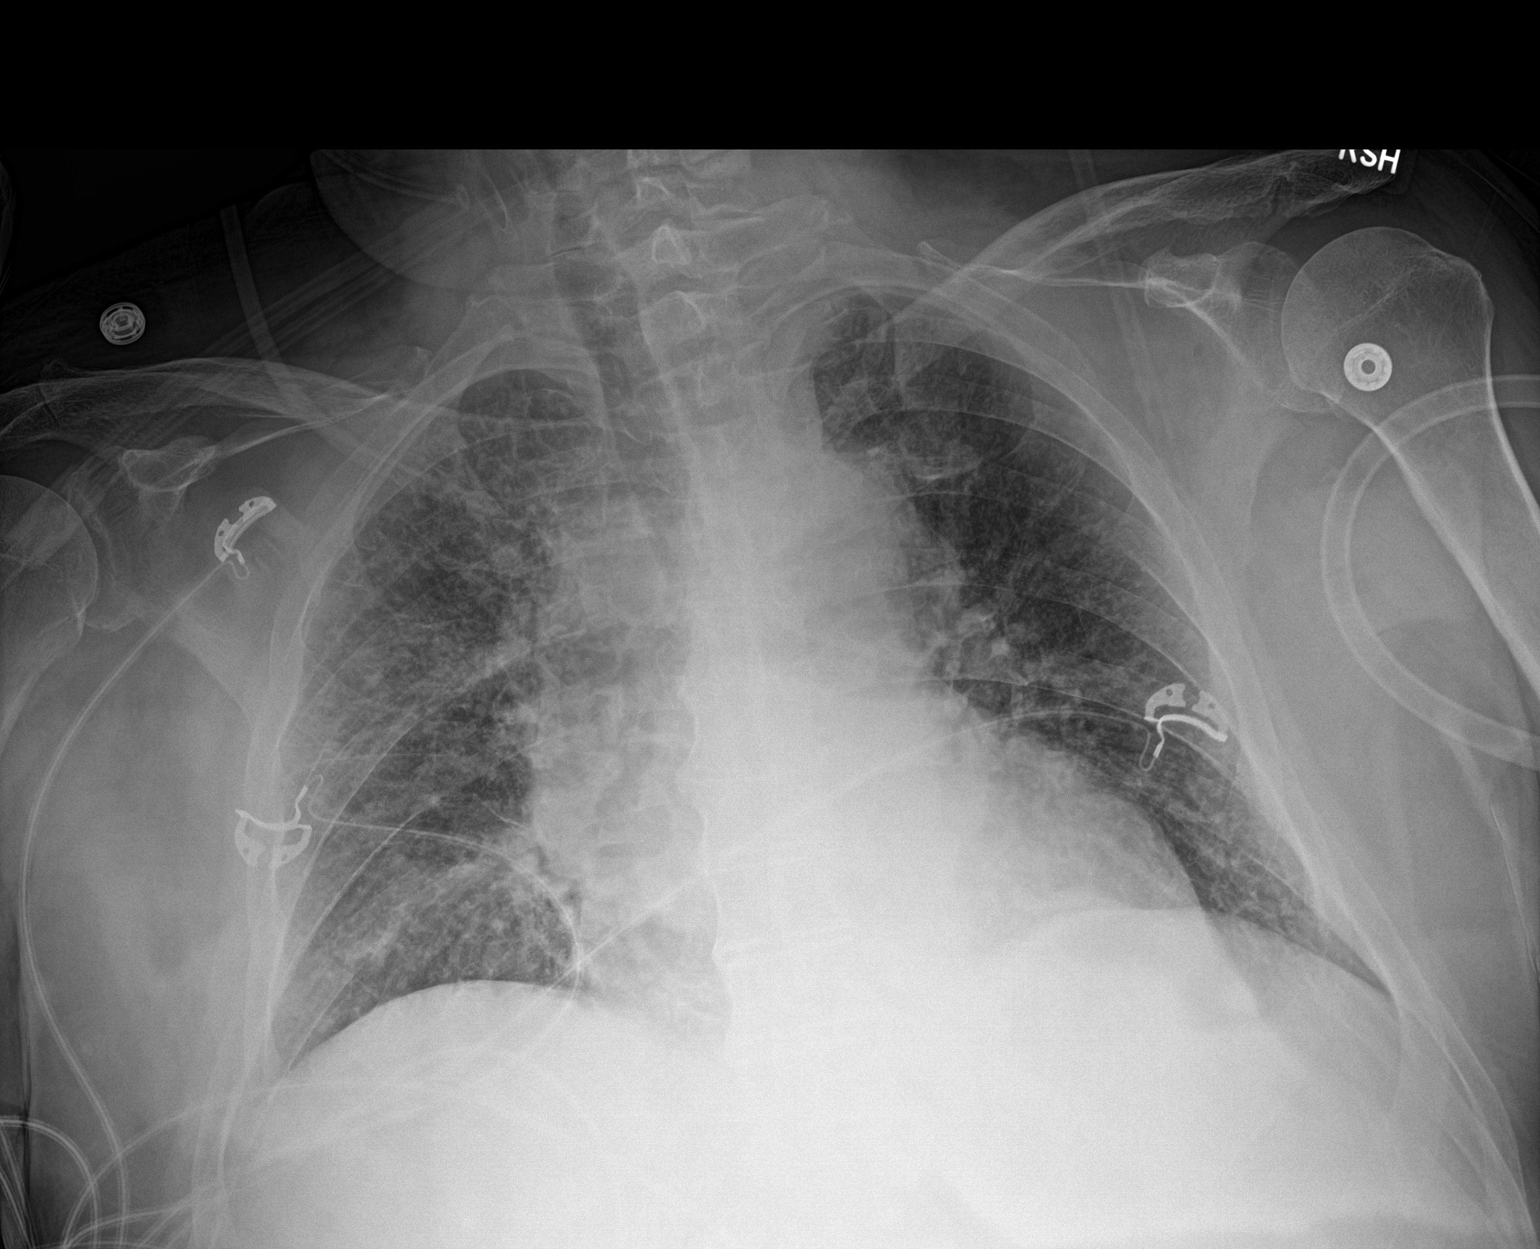

[1 of 1 positions shown; findings below may reference images not displayed]

FINDINGS: Improvement in right upper lobe atelectasis. There remains mild
airspace disease in the right upper lobe and right lower lobe. No
effusion

Negative for heart failure or edema.  Left lung clear.
IMPRESSION: Improvement in right upper lobe atelectasis. Persistent mild
airspace disease right upper lobe and right lower lobe.

## 2020-03-14 MED ORDER — FOLIC ACID 1 MG PO TABS
1.0000 mg | ORAL_TABLET | Freq: Every day | ORAL | Status: DC
  Administered 2020-03-15 – 2020-03-23 (×9): 1 mg via ORAL
  Filled 2020-03-14 (×9): qty 1

## 2020-03-14 MED ORDER — MORPHINE SULFATE (PF) 2 MG/ML IV SOLN
2.0000 mg | INTRAVENOUS | Status: DC | PRN
  Administered 2020-03-15 – 2020-03-17 (×3): 2 mg via INTRAVENOUS
  Filled 2020-03-14 (×3): qty 1

## 2020-03-14 MED ORDER — POTASSIUM CHLORIDE CRYS ER 20 MEQ PO TBCR
40.0000 meq | EXTENDED_RELEASE_TABLET | Freq: Once | ORAL | Status: AC
  Administered 2020-03-14: 40 meq via ORAL
  Filled 2020-03-14: qty 2

## 2020-03-14 MED ORDER — OXYCODONE-ACETAMINOPHEN 5-325 MG PO TABS
1.0000 | ORAL_TABLET | ORAL | Status: DC | PRN
  Administered 2020-03-14 – 2020-03-23 (×14): 1 via ORAL
  Filled 2020-03-14 (×16): qty 1

## 2020-03-14 MED ORDER — AMLODIPINE BESYLATE 10 MG PO TABS
10.0000 mg | ORAL_TABLET | Freq: Every day | ORAL | Status: DC
  Administered 2020-03-14 – 2020-03-21 (×8): 10 mg via ORAL
  Filled 2020-03-14 (×10): qty 1

## 2020-03-14 MED ORDER — CEFAZOLIN SODIUM-DEXTROSE 1-4 GM/50ML-% IV SOLN
1.0000 g | Freq: Three times a day (TID) | INTRAVENOUS | Status: DC
  Administered 2020-03-14 – 2020-03-22 (×24): 1 g via INTRAVENOUS
  Filled 2020-03-14 (×34): qty 50

## 2020-03-14 NOTE — Progress Notes (Signed)
PROGRESS NOTE    [name] [name]  EGB:[id number] DOB: [birth year] DOA: 03/08/2020 PCP: [name], MD    Brief Narrative:  74 y.o.malewith medical history significant formemory loss, chronic kidney disease, type 2 diabetes mellitus, AAA, hypertension, and hyperlipidemia, now presenting to the emergency department for evaluation of confusion, hypoglycemia, and a fall.  MRI showed stroke involving the L frontal and ocipital lobes.  He was noted to have L carotid artery disease and vascular surgery was c/s for CEA.  S/p CEA on 9/10. Developed large post-procedural hematoma requiring evacuation of hematoma  Assessment & Plan:   Principal Problem:   Acute ischemic stroke [facility]) Active Problems:   Diabetes mellitus type II, non insulin dependent ([place])   Memory loss   Renal insufficiency   Hypertension   Stroke ([place])   1.Acute ischemic CVA -Presents with speech difficulty, confusion, and a fall and is found to have acute ischemic infarctions on MRI involving left frontal and occipital lobes - CTA head/neck with extensive atheromatous disease of the aorta and branch vessels.  30% luminal narrowing of innominate 2/2 atheromatous plaque.  50% narrowing of L carotid bulb and 80-90% narrowing of proximal L ICA 2/2 atheromaous plaque.  Mild R V1 and V4 segment narrowing 2/2 atheromatous diseae.  Mild narrowing of proximal R petrous and L paraclinoid ICA. - carotid US notable for L ICA velocities consistent with 60-79% stenosis, PSV at bulb suggest 80-99% stenosis.  -Neurology, recommended crestor 40 mg, vascular surgery consult -> he's now s/p L CEA on 9/10 by vascular, see below - echo with EF 55-60%, no RWMA (see report) -Continue cardiac monitoring, frequent neuro checks -PT/OT/SLP eval -> OT recommending outpatient OT, 24 hr supervision/assistance, 3 in 1 bedside commode.  Outpatient SLP.  No PT follow up.   - LDL 95, a1c 6.8  -Pt now in rate-controlled afib, confirmed on EKG,  reviewed -Discussed with Neruology. Recommendation to continue on ASA alone for several days followed by full dose anticoagulant when and if OK with Vascular Surgery -Currently off anticoagulant/antiplatelet secondary to large hematoma following CEA  # Carotid Artery Stenosis: s/p L CEA 9/10. With development of large post-procedural hematoma, required evacuation and placement of drainage catheter. Vascular surgery following. Concern for bleed secondary to recent uncontrolled BP, see below.  -Continued on Cleviprex gtt per vascular with beta blocker and now norvasc added -See above and below. Now in afib. Per Neurology, recommend resuming ASA alone followed by full anticoagulation if/when OK with Vascular Surgery  2.Type II DM - on glimepiride at home, currently on hold - A1c 6.8 - continue SSI as needed  3.Renal insufficiency -SCr is 1.46 on admission - creatinine 1.24 today, continue to follow -He has CKD on problem list but most recent prior labs available are from 2014 when SCr was 0.9 -Renally-dose medications, continue to monitor  4.Hypertension - Had initially allowed permissive HTN -BP suboptimally controlled overnight, likely precipitated acute blood loss post-CEA -Pt now on Cleviprex gtt per Vascular surgery with much improved BP -Plan to ultimately wean off Cleviprex -continued on metoprolol with norvasc added today  5.Hyperlipidemia -Diet resumed, thus will resume crestor -LDL of 95  6.Memory loss Etoh Abuse:  -Followed by neurology, treated with Aricept and Namenda - thiamine level (elevated) - on high dose thiamine x 3 days, then 100 mg daily - follow B12 (low normal, follow MMA normal) - continue folate supplementation - CIWA  7.OSA -Patient reports CPAP intolerance  # Cough: x2-3 weeks, follow CXR - follow outpatient -  CXR with mild peribronchial thickening, consider discharge with inhaler  9. Acute blood loss  anemia -secondary to L neck hematoma following CEA, likely related to poorly controlled BP -Hgb stable thus far -Repeat CBC in AM  10. R upper lobe atelectasis -Recently hypoxemic requiring up to venturi mask -CXR personally reviewed with findings of dense RUL atelectasis -Appreciate assistance by Pulmonary. Findings likely secondary to blood during intubation -O2 requirements are improving  11. New Afib -rate controlled, confirmed on tele and EKG -Discussed case with Neurology. Recommendation to start ASA alone x several days to ensure no further bleed followed by full dose anticoagulation when and if OK with Vascular Surgery -Will check Mg level  DVT prophylaxis: SCD's Code Status: Full Family Communication: Pt in room, family not at bedside  Status is: Inpatient  Remains inpatient appropriate because:Hemodynamically unstable, Ongoing diagnostic testing needed not appropriate for outpatient work up, Unsafe d/c plan, IV treatments appropriate due to intensity of illness or inability to take PO and Inpatient level of care appropriate due to severity of illness   Dispo: The patient is from: Home              Anticipated d/c is to: Home              Anticipated d/c date is: > 3 days              Patient currently is not medically stable to d/c.   Consultants:   Neurology  Vascular surgery  Pulmonary  Procedures:   CEA 9/10  Evacuation of L neck hematoma 9/11  Antimicrobials: Anti-infectives (From admission, onward)   Start     Dose/Rate Route Frequency Ordered Stop   03/14/20 1600  ceFAZolin (ANCEF) IVPB 1 g/50 mL premix        1 g 100 mL/hr over 30 Minutes Intravenous Every 8 hours 03/14/20 1516     03/11/20 1800  ceFAZolin (ANCEF) IVPB 2g/100 mL premix        2 g 200 mL/hr over 30 Minutes Intravenous Every 8 hours 03/11/20 1740 03/12/20 0138      Subjective: Without complaints this AM  Objective: Vitals:   03/14/20 1459 03/14/20 1500 03/14/20 1530  03/14/20 1533  BP: 131/62 (!) 123/58 (!) 127/57   Pulse: 72 70 70   Resp: 16 11 18    Temp:    97.7 F (36.5 C)  TempSrc:    Oral  SpO2: 98% 100% 92%   Weight:      Height:        Intake/Output Summary (Last 24 hours) at 03/14/2020 1543 Last data filed at 03/14/2020 1400 Gross per 24 hour  Intake 1377.51 ml  Output 2005 ml  Net -627.49 ml   Filed Weights   03/09/20 0201 03/14/20 0600  Weight: 84.1 kg 88.7 kg    Examination: General exam: Conversant, in no acute distress Respiratory system: normal chest rise, clear, no audible wheezing Cardiovascular system: regula rate, s1-s2 Gastrointestinal system: Nondistended, nontender, pos BS Central nervous system: No seizures, no tremors Extremities: No cyanosis, no joint deformities Skin: No rashes, no pallor Psychiatry: Affect normal // no auditory hallucinations   Data Reviewed: I have personally reviewed following labs and imaging studies  CBC: Recent Labs  Lab 03/08/20 1613 03/09/20 1000 03/10/20 0423 03/10/20 0423 03/11/20 0139 03/11/20 0139 03/12/20 0408 03/12/20 0823 03/12/20 1130 03/13/20 0500 03/14/20 0318  WBC 12.7*   < > 8.5   < > 7.1  --  9.4  --  12.7* 9.5 10.2  NEUTROABS 10.6*  --  5.8  --  3.9  --   --   --   --   --   --   HGB 14.0   < > 12.4*   < > 12.4*   < > 10.6* 9.5* 10.0* 8.9* 9.0*  HCT 41.7   < > 37.6*   < > 36.5*   < > 32.2* 28.0* 30.7* 27.7* 27.9*  MCV 91.0   < > 91.9   < > 91.0  --  91.0  --  94.5 95.2 96.5  PLT 187   < > 148*   < > 163  --  173  --  201 191 195   < > = values in this interval not displayed.   Basic Metabolic Panel: Recent Labs  Lab 03/09/20 0338 03/09/20 1000 03/10/20 0423 03/10/20 0423 03/11/20 0139 03/12/20 0408 03/12/20 0823 03/13/20 0500 03/14/20 0318  NA  --    < > 140   < > 140 140 143 140 142  K  --    < > 3.4*   < > 3.8 4.1 3.7 3.7 3.5  CL  --    < > 107  --  105 109  --  109 110  CO2  --    < > 24  --  24 21*  --  24 25  GLUCOSE  --    < > 106*  --  90  145*  --  142* 119*  BUN  --    < > 12  --  13 12  --  8 8  CREATININE  --    < > 1.20  --  1.19 1.24  --  1.13 1.02  CALCIUM  --    < > 9.3  --  9.6 8.8*  --  8.4* 8.5*  MG 2.2  --  2.0  --  2.0  --   --   --   --   PHOS  --   --  2.7  --  3.4  --   --   --   --    < > = values in this interval not displayed.   GFR: Estimated Creatinine Clearance: 70 mL/min (by C-G formula based on SCr of 1.02 mg/dL). Liver Function Tests: Recent Labs  Lab 03/08/20 1613 03/09/20 1000 03/10/20 0423 03/11/20 0139 03/13/20 0500  AST 39 34 31 26 15   ALT 30 29 33 33 12  ALKPHOS 120 101 98 116 80  BILITOT 0.7 1.1 1.0 0.9 0.5  PROT 6.7 5.9* 6.3* 6.1* 5.1*  ALBUMIN 3.9 3.3* 3.4* 3.1* 2.8*   Recent Labs  Lab 03/08/20 1613  LIPASE 28   No results for input(s): AMMONIA in the last 168 hours. Coagulation Profile: Recent Labs  Lab 03/08/20 1613 03/11/20 0139  INR 0.9 0.9   Cardiac Enzymes: No results for input(s): CKTOTAL, CKMB, CKMBINDEX, TROPONINI in the last 168 hours. BNP (last 3 results) No results for input(s): PROBNP in the last 8760 hours. HbA1C: No results for input(s): HGBA1C in the last 72 hours. CBG: Recent Labs  Lab 03/13/20 1514 03/13/20 2149 03/14/20 0650 03/14/20 1103 03/14/20 1534  GLUCAP 117* 118* 107* 125* 107*   Lipid Profile: Recent Labs    03/14/20 0318  TRIG 144   Thyroid Function Tests: No results for input(s): TSH, T4TOTAL, FREET4, T3FREE, THYROIDAB in the last 72 hours. Anemia Panel: No results for input(s): VITAMINB12, FOLATE, FERRITIN, TIBC, IRON, RETICCTPCT  in the last 72 hours. Sepsis Labs: No results for input(s): PROCALCITON, LATICACIDVEN in the last 168 hours.  Recent Results (from the past 240 hour(s))  SARS Coronavirus 2 by RT PCR (hospital order, performed in [facility] hospital lab) Nasopharyngeal Nasopharyngeal Swab     Status: None   Collection Time: 03/08/20  5:03 PM   Specimen: Nasopharyngeal Swab  Result Value Ref Range Status    SARS Coronavirus 2 NEGATIVE NEGATIVE Final    Comment: (NOTE) SARS-CoV-2 target nucleic acids are NOT DETECTED.  The SARS-CoV-2 RNA is generally detectable in upper and lower respiratory specimens during the acute phase of infection. The lowest concentration of SARS-CoV-2 viral copies this assay can detect is 250 copies / mL. A negative result does not preclude SARS-CoV-2 infection and should not be used as the sole basis for treatment or other patient management decisions.  A negative result may occur with improper specimen collection / handling, submission of specimen other than nasopharyngeal swab, presence of viral mutation(s) within the areas targeted by this assay, and inadequate number of viral copies (<250 copies / mL). A negative result must be combined with clinical observations, patient history, and epidemiological information.  Fact Sheet for Patients:   StrictlyIdeas.no  Fact Sheet for Healthcare Providers: BankingDealers.co.za  This test is not yet approved or  cleared by the [place] FDA and has been authorized for detection and/or diagnosis of SARS-CoV-2 by FDA under an Emergency Use Authorization (EUA).  This EUA will remain in effect (meaning this test can be used) for the duration of the COVID-19 declaration under Section 564(b)(1) of the Act, 21 U.S.C. section 360bbb-3(b)(1), unless the authorization is terminated or revoked sooner.  Performed at [facility], [place] [address] [place], [place] [address]   Culture, Urine     Status: None   Collection Time: 03/08/20 10:21 PM   Specimen: Urine, Random  Result Value Ref Range Status   Specimen Description URINE, RANDOM  Final   Special Requests NONE  Final   Culture   Final    NO GROWTH Performed at [facility], [place] [address] [place], [place] [address]    Report Status 03/09/2020 FINAL  Final  MRSA PCR Screening     Status: None   Collection  Time: 03/11/20  6:34 AM   Specimen: Nasopharyngeal  Result Value Ref Range Status   MRSA by PCR NEGATIVE NEGATIVE Final    Comment:        The GeneXpert MRSA Assay (FDA approved for NASAL specimens only), is one component of a comprehensive MRSA colonization surveillance program. It is not intended to diagnose MRSA infection nor to guide or monitor treatment for MRSA infections. Performed at [facility], [place] [address], [place],  [address]      Radiology Studies: DG Chest Port 1 View  Result Date: 03/14/2020 CLINICAL DATA:  Acute respiratory failure. EXAM: PORTABLE CHEST 1 VIEW COMPARISON:  03/13/2020 FINDINGS: Improvement in right upper lobe atelectasis. There remains mild airspace disease in the right upper lobe and right lower lobe. No effusion Negative for heart failure or edema.  Left lung clear. IMPRESSION: Improvement in right upper lobe atelectasis. Persistent mild airspace disease right upper lobe and right lower lobe. Electronically Signed   By: Franchot Gallo M.D.   On: 03/14/2020 08:17   DG Chest Port 1 View  Result Date: 03/13/2020 CLINICAL DATA:  Postop day 2 carotid endarterectomy. Follow-up RIGHT UPPER LOBE atelectasis. EXAM: PORTABLE CHEST 1  VIEW COMPARISON:  03/12/2020 and earlier. FINDINGS: Cardiac silhouette normal in size for AP portable technique. Improved aeration in the RIGHT UPPER LOBE since yesterday, though atelectasis persists. Stable scarring at the RIGHT lung base. No new pulmonary parenchymal abnormalities. IMPRESSION: Improved aeration in the RIGHT UPPER LOBE since yesterday, though atelectasis persists. No new abnormalities. Electronically Signed   By: Evangeline Dakin M.D.   On: 03/13/2020 09:09    Scheduled Meds: . amLODipine  10 mg Oral Daily  . Chlorhexidine Gluconate Cloth  6 each Topical Daily  . citalopram  20 mg Oral Daily  . donepezil  10 mg Oral QHS  . [START ON 7/47/1855] folic acid  1 mg Oral Daily  . insulin aspart  0-5  Units Subcutaneous QHS  . insulin aspart  0-9 Units Subcutaneous TID WC  . memantine  10 mg Oral BID  . metoprolol tartrate  50 mg Oral BID  . pantoprazole  40 mg Oral Daily  . rosuvastatin  40 mg Oral q1800  . sodium chloride HYPERTONIC  4 mL Nebulization TID  . vitamin B-12  1,000 mcg Oral Daily   Continuous Infusions: . sodium chloride Stopped (03/11/20 0532)  . sodium chloride    . sodium chloride 50 mL/hr at 03/14/20 0700  .  ceFAZolin (ANCEF) IV    . clevidipine 16 mg/hr (03/14/20 1400)  . magnesium sulfate bolus IVPB       LOS: 5 days   Marylu Lund, MD [facility] Pager On Amion  If 7PM-7AM, please contact night-coverage 03/14/2020, 3:43 PM

## 2020-03-14 NOTE — Progress Notes (Signed)
[facility] [place]  Subjective  - no acute events overnight.  Neck hematoma softer.  Drain in place.  On cleviprex.    Objective (!) 119/58 69 98 F (36.7 C) (Oral) 12 96%  Intake/Output Summary (Last 24 hours) at 03/14/2020 1056 Last data filed at 03/14/2020 1000 Gross per 24 hour  Intake 2175.85 ml  Output 2035 ml  Net 140.85 ml   Cranial nerves II through XII grossly intact. No upper or lower extremity deficits. Drain in left endarterectomy site tunneled to the neck with drainage that is clearer today Softer hematoma in left neck  Laboratory Lab Results: Recent Labs    03/13/20 0500 03/14/20 0318  WBC 9.5 10.2  HGB 8.9* 9.0*  HCT 27.7* 27.9*  PLT 191 195   BMET Recent Labs    03/13/20 0500 03/14/20 0318  NA 140 142  K 3.7 3.5  CL 109 110  CO2 24 25  GLUCOSE 142* 119*  BUN 8 8  CREATININE 1.13 1.02  CALCIUM 8.4* 8.5*    COAG Lab Results  Component Value Date   INR 0.9 03/11/2020   INR 0.9 03/08/2020   INR 1.02 04/29/2013   No results found for: PTT  Assessment/Planning:  74 year old male postop day 3 status post left carotid endarterectomy. He required washout of a left neck hematoma Saturday morning and drain placement. When he got to the floor after surgery he had profound volatile blood pressure with systolics over 846 and reaccumulated another neck hematoma. Moved to the ICU on a Cleviprex drip that has been maxed out. Neck hematoma continues to look softer today.Drain output 30 mL past 24 hours but still bloody.  Leave drain until tomorrow.   Appreciate critical care and medicine input.  Started on metoprolol and added norvasc today and trying to wean cleviprex.    Marty Heck, MD [facility] [place] Office: [phone]    .Marty Heck 03/14/2020 10:56 AM --

## 2020-03-14 NOTE — Progress Notes (Signed)
NAME:  [name] [name], MRN:  [id number], DOB:  [birth year], LOS: 5 ADMISSION DATE:  03/08/2020, CONSULTATION DATE:  03/14/2020  REFERRING MD:  Wyline Copas, [facility], CHIEF COMPLAINT:  resp distress   Brief History   74 year old man admitted 9/7 with  left frontal and occipital CVA , underwent left CEA 9/10 , required left neck washout for hematoma.  Transferred to ICU for blood pressure management developed hypoxia postop with right upper lobe atelectasis, hence PCCM consulted   Past Medical History  Diabetes type 2 Hypertension Hyperlipidemia  Significant Hospital Events   9/10 left CEA 9/11 transferred to ICU post evacuation of left hematoma for blood pressure management 9/12 improved aeration right upper lobe  Consults:  Vascular  Procedures:    Significant Diagnostic Tests:  MRI brain 9/7 small acute infarcts left frontal and occipital lobes, moderate cerebral atrophy, sequelae of remote bilateral basal ganglia thalamic and cerebellar insults MRI cervical and thoracic spine 9/7>> mild cervical spondylosis  CT angiogram neck 9/7 >> 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 30% luminal narrowing of the innominate secondary to atheromatous plaque.  Approximately 50% narrowing of the left carotid bulb and 80-90% narrowing of the proximal left ICA secondary to atheromatous plaque.  Micro Data:    Antimicrobials:     Interim history/subjective:   Much improved Out of bed to chair Remains on 10 L nasal cannula  Objective   Blood pressure (!) 119/58, pulse 69, temperature 98 F (36.7 C), temperature source Oral, resp. rate 12, height 5\' 9"  (1.753 m), weight 88.7 kg, SpO2 95 %.        Intake/Output Summary (Last 24 hours) at 03/14/2020 1030 Last data filed at 03/14/2020 1000 Gross per 24 hour  Intake 2175.85 ml  Output 2035 ml  Net 140.85 ml   Filed Weights   03/09/20 0201 03/14/20 0600  Weight: 84.1 kg 88.7 kg    Examination: General:  Elderly man, no distress, on 10 L nasal cannula HENT: Mild pallor, no icterus, left neck hematoma appears soft, no increase, JP drain minimal, voice is stronger Lungs: No accessory muscle use, able to speak in full sentences, bilateral breath sounds Cardiovascular: S1-S2 regular Abdomen: Soft nontender abdomen Extremities: No rash, no edema Neuro: Awake, alert, interactive, power 4+/5 all 4 extremities, nonfocal  Able to move >1500 cc on incentive spirometer, with strong cough Chest x-ray 9/13 independently reviewed-much improved aeration right upper lobe  Labs show stable anemia, mild hypokalemia, improved renal function  Resolved Hospital Problem list     Assessment & Plan:  Acute respiratory failure related to right upper lobe atelectasis likely from aspirated blood due to airway bleeding,?  Traumatic intubation for emergent surgery Work of breathing is acceptable, oxygen requirements decreasing  -Continue incentive spirometry , continue to dial down FiO2 -Hypertonic saline nebs -Mobilize in a chair or upright -With improved aeration on chest x-ray does not need bronchoscopy  Hypertensive urgency - -Ct metoprolol 50 twice daily -Add amlodipine 10 mg and taper Cleviprex to off  Acute CVA Hematoma post left CEA -aspirin and Plavix can be resumed per vascular -Mobilize per PT  PCCM will be available as needed   Best practice:  Diet: NPO Pain/Anxiety/Delirium protocol (if indicated): N/A VAP protocol (if indicated): N/A DVT prophylaxis: SCDs GI prophylaxis: Protonix Glucose control: SSI Mobility: Bedrest Code Status: Full Family Communication: per primary Disposition: ICU  Labs   CBC: Recent Labs  Lab 03/08/20 1613 03/09/20 1000 03/10/20 0423 03/10/20 0423 03/11/20 0139 03/11/20 0139  03/12/20 0408 03/12/20 0823 03/12/20 1130 03/13/20 0500 03/14/20 0318  WBC 12.7*   < > 8.5   < > 7.1  --  9.4  --  12.7* 9.5 10.2  NEUTROABS 10.6*  --  5.8  --  3.9  --   --    --   --   --   --   HGB 14.0   < > 12.4*   < > 12.4*   < > 10.6* 9.5* 10.0* 8.9* 9.0*  HCT 41.7   < > 37.6*   < > 36.5*   < > 32.2* 28.0* 30.7* 27.7* 27.9*  MCV 91.0   < > 91.9   < > 91.0  --  91.0  --  94.5 95.2 96.5  PLT 187   < > 148*   < > 163  --  173  --  201 191 195   < > = values in this interval not displayed.    Basic Metabolic Panel: Recent Labs  Lab 03/09/20 0338 03/09/20 1000 03/10/20 0423 03/10/20 0423 03/11/20 0139 03/12/20 0408 03/12/20 0823 03/13/20 0500 03/14/20 0318  NA  --    < > 140   < > 140 140 143 140 142  K  --    < > 3.4*   < > 3.8 4.1 3.7 3.7 3.5  CL  --    < > 107  --  105 109  --  109 110  CO2  --    < > 24  --  24 21*  --  24 25  GLUCOSE  --    < > 106*  --  90 145*  --  142* 119*  BUN  --    < > 12  --  13 12  --  8 8  CREATININE  --    < > 1.20  --  1.19 1.24  --  1.13 1.02  CALCIUM  --    < > 9.3  --  9.6 8.8*  --  8.4* 8.5*  MG 2.2  --  2.0  --  2.0  --   --   --   --   PHOS  --   --  2.7  --  3.4  --   --   --   --    < > = values in this interval not displayed.   GFR: Estimated Creatinine Clearance: 70 mL/min (by C-G formula based on SCr of 1.02 mg/dL). Recent Labs  Lab 03/12/20 0408 03/12/20 1130 03/13/20 0500 03/14/20 0318  WBC 9.4 12.7* 9.5 10.2     Kara Mead MD. FCCP. [facility]  If no response to pager , please call [phone]  After 7:00 pm call Elink  [phone]   03/14/2020

## 2020-03-14 NOTE — Progress Notes (Signed)
Physical Therapy Treatment Patient Details Name: [name] [name] MRN: [id number] DOB: 03-12-1946 Today's Date: 03/14/2020    History of Present Illness Pt is a 74 y/o male admitted with confusion, hypoglycemia, and a fall. 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 Pt s/p Left CEA on 9/10 with post procedure hemorrhage requiring evacuation and hypoxia. PMHx: memory loss, CKD, DM2, AAA, HTN    PT Comments    Pt very pleasant in chair on arrival stating sore sacrum from sitting in chair with geomat provided. Pt with improved gait tolerance but requested RW use this session due to recent sx. Pt with maintained SpO2 94-96% on 4L with gait, HR 75. Pt educated for continued progression and activity with memory deficits still noted. Pt educated for not straining with neck particularly with bed mobility.     Follow Up Recommendations  No PT follow up;Supervision - Intermittent     Equipment Recommendations  None recommended by PT    Recommendations for Other Services       Precautions / Restrictions Precautions Precautions: Fall Precaution Comments: jp drain Restrictions Weight Bearing Restrictions: No    Mobility  Bed Mobility               General bed mobility comments: in chair on arrival and denied bed mobility  Transfers Overall transfer level: Needs assistance   Transfers: Sit to/from Stand Sit to Stand: Supervision         General transfer comment: supervision for lines  Ambulation/Gait Ambulation/Gait assistance: Supervision Gait Distance (Feet): 800 Feet Assistive device: Rolling walker (2 wheeled) Gait Pattern/deviations: Step-through pattern;Decreased stride length   Gait velocity interpretation: >2.62 ft/sec, indicative of community ambulatory General Gait Details: supervision for lines and direction with cues to step into RW   Stairs             Wheelchair Mobility    Modified Rankin (Stroke Patients  Only)       Balance     Sitting balance-Leahy Scale: Good     Standing balance support: Bilateral upper extremity supported Standing balance-Leahy Scale: Poor                              Cognition Arousal/Alertness: Awake/alert Behavior During Therapy: WFL for tasks assessed/performed Overall Cognitive Status: History of cognitive impairments - at baseline                       Memory: Decreased short-term memory Following Commands: Follows multi-step commands consistently       General Comments: pt with STM deficits at baseline, unable to recall room number      Exercises      General Comments        Pertinent Vitals/Pain Pain Assessment: No/denies pain    Home Living                      Prior Function            PT Goals (current goals can now be found in the care plan section) Acute Rehab PT Goals Time For Goal Achievement: 03/21/20 Potential to Achieve Goals: Good Progress towards PT goals: Progressing toward goals    Frequency    Min 3X/week      PT Plan Current plan remains appropriate;Frequency needs to be updated    Co-evaluation  AM-PAC PT "6 Clicks" Mobility   Outcome Measure  Help needed turning from your back to your side while in a flat bed without using bedrails?: None Help needed moving from lying on your back to sitting on the side of a flat bed without using bedrails?: None Help needed moving to and from a bed to a chair (including a wheelchair)?: A Little Help needed standing up from a chair using your arms (e.g., wheelchair or bedside chair)?: A Little Help needed to walk in hospital room?: A Little Help needed climbing 3-5 steps with a railing? : A Little 6 Click Score: 20    End of Session Equipment Utilized During Treatment: Oxygen Activity Tolerance: Patient tolerated treatment well Patient left: in chair;with call bell/phone within reach;with nursing/sitter in  room Nurse Communication: Mobility status PT Visit Diagnosis: Unsteadiness on feet (R26.81);Other symptoms and signs involving the nervous system (R29.898)     Time: 3149-7026 PT Time Calculation (min) (ACUTE ONLY): 25 min  Charges:  $Gait Training: 8-22 mins $Therapeutic Activity: 8-22 mins                     Nashley Cordoba P, PT Acute Rehabilitation Services Pager: [phone] Office: [place] 03/14/2020, 10:40 AM

## 2020-03-14 NOTE — Progress Notes (Addendum)
Progress Note    03/14/2020 7:05 AM 2 Days Post-Op  Subjective: Postop pain controlled.  He is out of bed to chair has not yet started his clear liquid breakfast.  He states his throat is somewhat sore when swallowing but no difficulty getting liquids down.  Ambulating and voiding spontaneously.  He has attendant dayshift nurses at bedside.  She was assigned him yesterday and stated he had sustained atrial fibrillation with controlled rate.  Clevidipine continues for hypertension.   Vitals:   03/14/20 0500 03/14/20 0600  BP:  (!) 130/52  Pulse: 72 75  Resp: 13 14  Temp:    SpO2: 40% 98%   Systolic BP range overnight: 100s-130s. Physical Exam: Cardiac: Rate and rhythm are regular Lungs: Clear to auscultation bilaterally Incisions: Left neck incision well approximated with minimal hematoma.  Soft to palpation.  Ecchymosis noted. Extremities: 5 out of 5 hand grip strength.  Lower extremity motor intact Neurologic: He is alert and oriented x4.  Face is symmetric.  Tongue is midline.  Speech is fluent  CBC    Component Value Date/Time   WBC 10.2 03/14/2020 0318   RBC 2.89 (L) 03/14/2020 0318   HGB 9.0 (L) 03/14/2020 0318   HCT 27.9 (L) 03/14/2020 0318   PLT 195 03/14/2020 0318   MCV 96.5 03/14/2020 0318   MCH 31.1 03/14/2020 0318   MCHC 32.3 03/14/2020 0318   RDW 13.5 03/14/2020 0318   LYMPHSABS 1.9 03/11/2020 0139   MONOABS 0.8 03/11/2020 0139   EOSABS 0.4 03/11/2020 0139   BASOSABS 0.0 03/11/2020 0139    BMET    Component Value Date/Time   NA 142 03/14/2020 0318   K 3.5 03/14/2020 0318   CL 110 03/14/2020 0318   CO2 25 03/14/2020 0318   GLUCOSE 119 (H) 03/14/2020 0318   BUN 8 03/14/2020 0318   CREATININE 1.02 03/14/2020 0318   CALCIUM 8.5 (L) 03/14/2020 0318   GFRNONAA >60 03/14/2020 0318   GFRAA >60 03/14/2020 0318     Intake/Output Summary (Last 24 hours) at 03/14/2020 0705 Last data filed at 03/14/2020 0600 Gross per 24 hour  Intake 1987.49 ml  Output  2420 ml  Net -432.51 ml    HOSPITAL MEDICATIONS Scheduled Meds: . Chlorhexidine Gluconate Cloth  6 each Topical Daily  . citalopram  20 mg Oral Daily  . donepezil  10 mg Oral QHS  . folic acid  1 mg Intravenous Daily  . insulin aspart  0-5 Units Subcutaneous QHS  . insulin aspart  0-9 Units Subcutaneous TID WC  . memantine  10 mg Oral BID  . metoprolol tartrate  50 mg Oral BID  . pantoprazole  40 mg Oral Daily  . rosuvastatin  40 mg Oral q1800  . sodium chloride HYPERTONIC  4 mL Nebulization TID  . vitamin B-12  1,000 mcg Oral Daily   Continuous Infusions: . sodium chloride Stopped (03/11/20 0532)  . sodium chloride    . sodium chloride 50 mL/hr at 03/14/20 0600  . clevidipine 16 mg/hr (03/14/20 0600)  . magnesium sulfate bolus IVPB     PRN Meds:.sodium chloride, sodium chloride, acetaminophen **OR** acetaminophen (TYLENOL) oral liquid 160 mg/5 mL **OR** acetaminophen, albuterol, alum & mag hydroxide-simeth, bisacodyl, labetalol, magnesium sulfate bolus IVPB, metoprolol tartrate, morphine injection, ondansetron, phenol, promethazine  Assessment: POD 3 left CEA 9/10, return to OR for evacuation of hematoma 9/11; post-op hypertension.  New onset atrial fibrillation yesterday.  Critical care management aware.  Good urine output.  Hemoglobin stable.  Plan: -Continue clevidipine titration and oral metoprolol.  Atrial fibrillation, rate controlled: continue to monitor.  Statin on board and consider restarting Plavix and aspirin>> discuss with Dr. Carlis Abbott. -DVT prophylaxis: SCDs   Risa Grill, PA-C [facility] [phone] 03/14/2020  7:05 AM

## 2020-03-14 NOTE — Progress Notes (Signed)
Added Ancef for prophylaxis until his surgical drain is removed from CEA site to decrease risk of getting surgical site infection.  Marty Heck, MD [facility] [place] Office: [place]

## 2020-03-15 DIAGNOSIS — I639 Cerebral infarction, unspecified: Principal | ICD-10-CM

## 2020-03-15 LAB — CBC
HCT: 28.4 % — ABNORMAL LOW (ref 39.0–52.0)
Hemoglobin: 9.3 g/dL — ABNORMAL LOW (ref 13.0–17.0)
MCH: 30.6 pg (ref 26.0–34.0)
MCHC: 32.7 g/dL (ref 30.0–36.0)
MCV: 93.4 fL (ref 80.0–100.0)
Platelets: 208 10*3/uL (ref 150–400)
RBC: 3.04 MIL/uL — ABNORMAL LOW (ref 4.22–5.81)
RDW: 12.9 % (ref 11.5–15.5)
WBC: 9.7 10*3/uL (ref 4.0–10.5)
nRBC: 0 % (ref 0.0–0.2)

## 2020-03-15 LAB — COMPREHENSIVE METABOLIC PANEL
ALT: 12 U/L (ref 0–44)
AST: 17 U/L (ref 15–41)
Albumin: 2.8 g/dL — ABNORMAL LOW (ref 3.5–5.0)
Alkaline Phosphatase: 96 U/L (ref 38–126)
Anion gap: 7 (ref 5–15)
BUN: 5 mg/dL — ABNORMAL LOW (ref 8–23)
CO2: 25 mmol/L (ref 22–32)
Calcium: 8.8 mg/dL — ABNORMAL LOW (ref 8.9–10.3)
Chloride: 108 mmol/L (ref 98–111)
Creatinine, Ser: 0.96 mg/dL (ref 0.61–1.24)
GFR calc Af Amer: >60 mL/min (ref >60)
GFR calc non Af Amer: >60 mL/min (ref >60)
Glucose, Bld: 126 mg/dL — ABNORMAL HIGH (ref 70–99)
Potassium: 3.2 mmol/L — ABNORMAL LOW (ref 3.5–5.1)
Sodium: 140 mmol/L (ref 135–145)
Total Bilirubin: 0.5 mg/dL (ref 0.3–1.2)
Total Protein: 5.7 g/dL — ABNORMAL LOW (ref 6.5–8.1)

## 2020-03-15 LAB — GLUCOSE, CAPILLARY
Glucose-Capillary: 113 mg/dL — ABNORMAL HIGH (ref 70–99)
Glucose-Capillary: 118 mg/dL — ABNORMAL HIGH (ref 70–99)
Glucose-Capillary: 109 mg/dL — ABNORMAL HIGH (ref 70–99)
Glucose-Capillary: 122 mg/dL — ABNORMAL HIGH (ref 70–99)

## 2020-03-15 MED ORDER — POTASSIUM CHLORIDE CRYS ER 20 MEQ PO TBCR
40.0000 meq | EXTENDED_RELEASE_TABLET | Freq: Two times a day (BID) | ORAL | Status: AC
  Administered 2020-03-15 (×2): 40 meq via ORAL
  Filled 2020-03-15 (×2): qty 2

## 2020-03-15 NOTE — Anesthesia Postprocedure Evaluation (Signed)
Anesthesia Post Note  Patient: [name] [name]  Procedure(s) Performed: EVACUATION HEMATOMA  AND WASHOUT OUT LEFT NECK WITH DRAIN PLACEMENT (Left )     Patient location during evaluation: PACU Anesthesia Type: General Level of consciousness: awake and alert Pain management: pain level controlled Vital Signs Assessment: post-procedure vital signs reviewed and stable Respiratory status: spontaneous breathing, nonlabored ventilation, respiratory function stable and patient connected to nasal cannula oxygen Cardiovascular status: blood pressure returned to baseline and stable Postop Assessment: no apparent nausea or vomiting Anesthetic complications: no   No complications documented.  Last Vitals:  Vitals:   03/15/20 1750 03/15/20 1800  BP:  (!) 117/55  Pulse: 72 74  Resp: 13 15  Temp:    SpO2: 97% 97%    Last Pain:  Vitals:   03/15/20 1800  TempSrc:   PainSc: 3                  Tiajuana Amass

## 2020-03-15 NOTE — Progress Notes (Signed)
PROGRESS NOTE    Kim Lauver  ZCH:[id number] DOB: 02-02-1946 DOA: 03/08/2020 PCP: [name], MD    Brief Narrative:  74 y.o.malewith medical history significant formemory loss, chronic kidney disease, type 2 diabetes mellitus, AAA, hypertension, and hyperlipidemia, now presenting to the emergency department for evaluation of confusion, hypoglycemia, and a fall.  MRI showed stroke involving the L frontal and ocipital lobes.  He was noted to have L carotid artery disease and vascular surgery was c/s for CEA.  S/p CEA on 9/10. Developed large post-procedural hematoma requiring evacuation of hematoma  Assessment & Plan:   Principal Problem:   Acute ischemic stroke [facility]) Active Problems:   Diabetes mellitus type II, non insulin dependent ([place])   Memory loss   Renal insufficiency   Hypertension   Stroke ([place])   1.Acute ischemic CVA -Presents with speech difficulty, confusion, and a fall and is found to have acute ischemic infarctions on MRI involving left frontal and occipital lobes - CTA head/neck with extensive atheromatous disease of the aorta and branch vessels.  30% luminal narrowing of innominate 2/2 atheromatous plaque.  50% narrowing of L carotid bulb and 80-90% narrowing of proximal L ICA 2/2 atheromaous plaque.  Mild R V1 and V4 segment narrowing 2/2 atheromatous diseae.  Mild narrowing of proximal R petrous and L paraclinoid ICA. - carotid US notable for L ICA velocities consistent with 60-79% stenosis, PSV at bulb suggest 80-99% stenosis.  -Neurology, recommended crestor 40 mg, vascular surgery consult -> he's now s/p L CEA on 9/10 by vascular, see below - echo with EF 55-60%, no RWMA (see report) -Continue cardiac monitoring, frequent neuro checks -PT/OT/SLP eval -> OT recommending outpatient OT, 24 hr supervision/assistance, 3 in 1 bedside commode.  Outpatient SLP.  No PT follow up.   - LDL 95, a1c 6.8  -Pt recently noted to have rate-controlled afib on tele,  confirmed on EKG, reviewed -Discussed with Neruology. Recommendation to continue on ASA alone for several days followed by full dose anticoagulant when and if OK with Vascular Surgery -Currently off anticoagulant/antiplatelet secondary to large hematoma following CEA, per below  # Carotid Artery Stenosis: s/p L CEA 9/10. With development of large post-procedural hematoma, required evacuation and placement of drainage catheter. Vascular surgery following. Concern for bleed secondary to recent uncontrolled BP, see below.  -Continued on Cleviprex gtt per vascular with beta blocker and now norvasc added -See above and below. Recently went into afib. Per Neurology, recommend resuming ASA alone followed by full anticoagulation if/when OK with Vascular Surgery -Per vascular surgery, likely d/c drain in next 24hrs  2.Type II DM - on glimepiride at home, currently on hold - A1c 6.8 - continue SSI as needed  3.Renal insufficiency -SCr is 1.46 on admission - creatinine 1.24 today, continue to follow -He has CKD on problem list but most recent prior labs available are from 2014 when SCr was 0.9 -Renally-dose medications, continue to monitor  4.Hypertension - Had initially allowed permissive HTN -BP suboptimally controlled overnight, likely precipitated acute blood loss post-CEA -Pt now on Cleviprex gtt per Vascular surgery with much improved BP -Plan to ultimately wean off Cleviprex -continued on metoprolol with norvasc -Given recent CVA, would avoid hypotension  5.Hyperlipidemia -Diet resumed, thus will resume crestor -LDL of 95  6.Memory loss Etoh Abuse:  -Followed by neurology, treated with Aricept and Namenda - thiamine level (elevated) - on high dose thiamine x 3 days, then 100 mg daily - follow B12 (low normal, follow MMA normal) - continue  folate supplementation - CIWA  7.OSA -Patient reports CPAP intolerance  # Cough: x2-3 weeks, follow CXR -  follow outpatient - CXR with mild peribronchial thickening, consider discharge with inhaler  9. Acute blood loss anemia -secondary to L neck hematoma following CEA, likely related to poorly controlled BP -Hgb stable thus far -Recheck CBC in AM  10. R upper lobe atelectasis -Recently hypoxemic requiring up to venturi mask -CXR personally reviewed with findings of dense RUL atelectasis -Appreciate assistance by Pulmonary. Findings likely secondary to blood during intubation -O2 requirements are improving  11. New Afib -rate controlled, confirmed on tele and EKG -Discussed case with Neurology. Recommendation to start ASA alone x several days to ensure no further bleed followed by full dose anticoagulation when and if OK with Vascular Surgery -Cont metoprolol as tolerated  DVT prophylaxis: SCD's Code Status: Full Family Communication: Pt in room, family not at bedside  Status is: Inpatient  Remains inpatient appropriate because:Hemodynamically unstable, Ongoing diagnostic testing needed not appropriate for outpatient work up, Unsafe d/c plan, IV treatments appropriate due to intensity of illness or inability to take PO and Inpatient level of care appropriate due to severity of illness   Dispo: The patient is from: Home              Anticipated d/c is to: Home              Anticipated d/c date is: > 3 days              Patient currently is not medically stable to d/c.   Consultants:   Neurology  Vascular surgery  Pulmonary  Procedures:   CEA 9/10  Evacuation of L neck hematoma 9/11  Antimicrobials: Anti-infectives (From admission, onward)   Start     Dose/Rate Route Frequency Ordered Stop   03/14/20 1600  ceFAZolin (ANCEF) IVPB 1 g/50 mL premix        1 g 100 mL/hr over 30 Minutes Intravenous Every 8 hours 03/14/20 1516     03/11/20 1800  ceFAZolin (ANCEF) IVPB 2g/100 mL premix        2 g 200 mL/hr over 30 Minutes Intravenous Every 8 hours 03/11/20 1740 03/12/20  0138      Subjective: No complaints this AM, reports feeling better  Objective: Vitals:   03/15/20 1140 03/15/20 1146 03/15/20 1200 03/15/20 1308  BP:   (!) 113/54   Pulse:  76  64  Resp:  18    Temp: 98.7 F (37.1 C)     TempSrc: Oral     SpO2:  96%  97%  Weight:      Height:        Intake/Output Summary (Last 24 hours) at 03/15/2020 1357 Last data filed at 03/15/2020 1225 Gross per 24 hour  Intake 752.56 ml  Output 3120 ml  Net -2367.44 ml   Filed Weights   03/09/20 0201 03/14/20 0600  Weight: 84.1 kg 88.7 kg    Examination: General exam: Awake, laying in bed, in nad, drain in place over L neck Respiratory system: Normal respiratory effort, no wheezing Cardiovascular system: regular rate, s1, s2 Gastrointestinal system: Soft, nondistended, positive BS Central nervous system: CN2-12 grossly intact, strength intact Extremities: Perfused, no clubbing Skin: Normal skin turgor, no notable skin lesions seen Psychiatry: Mood normal // no visual hallucinations   Data Reviewed: I have personally reviewed following labs and imaging studies  CBC: Recent Labs  Lab 03/08/20 1613 03/09/20 1000 03/10/20 0423 03/10/20 0423 03/11/20 0139  03/11/20 0139 03/12/20 0408 03/12/20 0408 03/12/20 0823 03/12/20 1130 03/13/20 0500 03/14/20 0318 03/15/20 0445  WBC 12.7*   < > 8.5   < > 7.1   < > 9.4  --   --  12.7* 9.5 10.2 9.7  NEUTROABS 10.6*  --  5.8  --  3.9  --   --   --   --   --   --   --   --   HGB 14.0   < > 12.4*   < > 12.4*   < > 10.6*   < > 9.5* 10.0* 8.9* 9.0* 9.3*  HCT 41.7   < > 37.6*   < > 36.5*   < > 32.2*   < > 28.0* 30.7* 27.7* 27.9* 28.4*  MCV 91.0   < > 91.9   < > 91.0   < > 91.0  --   --  94.5 95.2 96.5 93.4  PLT 187   < > 148*   < > 163   < > 173  --   --  201 191 195 208   < > = values in this interval not displayed.   Basic Metabolic Panel: Recent Labs  Lab 03/09/20 0338 03/09/20 1000 03/10/20 0423 03/10/20 0423 03/11/20 0139 03/11/20 0139  03/12/20 0408 03/12/20 0823 03/13/20 0500 03/14/20 0318 03/14/20 0328 03/15/20 0445  NA  --    < > 140   < > 140   < > 140 143 140 142  --  140  K  --    < > 3.4*   < > 3.8   < > 4.1 3.7 3.7 3.5  --  3.2*  CL  --    < > 107   < > 105  --  109  --  109 110  --  108  CO2  --    < > 24   < > 24  --  21*  --  24 25  --  25  GLUCOSE  --    < > 106*   < > 90  --  145*  --  142* 119*  --  126*  BUN  --    < > 12   < > 13  --  12  --  8 8  --  5*  CREATININE  --    < > 1.20   < > 1.19  --  1.24  --  1.13 1.02  --  0.96  CALCIUM  --    < > 9.3   < > 9.6  --  8.8*  --  8.4* 8.5*  --  8.8*  MG 2.2  --  2.0  --  2.0  --   --   --   --   --  1.9  --   PHOS  --   --  2.7  --  3.4  --   --   --   --   --   --   --    < > = values in this interval not displayed.   GFR: Estimated Creatinine Clearance: 74.4 mL/min (by C-G formula based on SCr of 0.96 mg/dL). Liver Function Tests: Recent Labs  Lab 03/09/20 1000 03/10/20 0423 03/11/20 0139 03/13/20 0500 03/15/20 0445  AST 34 31 26 15 17   ALT 29 33 33 12 12  ALKPHOS 101 98 116 80 96  BILITOT 1.1 1.0 0.9 0.5 0.5  PROT 5.9* 6.3* 6.1* 5.1* 5.7*  ALBUMIN 3.3* 3.4* 3.1* 2.8* 2.8*   Recent Labs  Lab 03/08/20 1613  LIPASE 28   No results for input(s): AMMONIA in the last 168 hours. Coagulation Profile: Recent Labs  Lab 03/08/20 1613 03/11/20 0139  INR 0.9 0.9   Cardiac Enzymes: No results for input(s): CKTOTAL, CKMB, CKMBINDEX, TROPONINI in the last 168 hours. BNP (last 3 results) No results for input(s): PROBNP in the last 8760 hours. HbA1C: No results for input(s): HGBA1C in the last 72 hours. CBG: Recent Labs  Lab 03/14/20 1103 03/14/20 1534 03/14/20 2142 03/15/20 0615 03/15/20 1139  GLUCAP 125* 107* 108* 113* 118*   Lipid Profile: Recent Labs    03/14/20 0318  TRIG 144   Thyroid Function Tests: No results for input(s): TSH, T4TOTAL, FREET4, T3FREE, THYROIDAB in the last 72 hours. Anemia Panel: No results for  input(s): VITAMINB12, FOLATE, FERRITIN, TIBC, IRON, RETICCTPCT in the last 72 hours. Sepsis Labs: No results for input(s): PROCALCITON, LATICACIDVEN in the last 168 hours.  Recent Results (from the past 240 hour(s))  SARS Coronavirus 2 by RT PCR (hospital order, performed in [facility] hospital lab) Nasopharyngeal Nasopharyngeal Swab     Status: None   Collection Time: 03/08/20  5:03 PM   Specimen: Nasopharyngeal Swab  Result Value Ref Range Status   SARS Coronavirus 2 NEGATIVE NEGATIVE Final    Comment: (NOTE) SARS-CoV-2 target nucleic acids are NOT DETECTED.  The SARS-CoV-2 RNA is generally detectable in upper and lower respiratory specimens during the acute phase of infection. The lowest concentration of SARS-CoV-2 viral copies this assay can detect is 250 copies / mL. A negative result does not preclude SARS-CoV-2 infection and should not be used as the sole basis for treatment or other patient management decisions.  A negative result may occur with improper specimen collection / handling, submission of specimen other than nasopharyngeal swab, presence of viral mutation(s) within the areas targeted by this assay, and inadequate number of viral copies (<250 copies / mL). A negative result must be combined with clinical observations, patient history, and epidemiological information.  Fact Sheet for Patients:   StrictlyIdeas.no  Fact Sheet for Healthcare Providers: BankingDealers.co.za  This test is not yet approved or  cleared by the [place] FDA and has been authorized for detection and/or diagnosis of SARS-CoV-2 by FDA under an Emergency Use Authorization (EUA).  This EUA will remain in effect (meaning this test can be used) for the duration of the COVID-19 declaration under Section 564(b)(1) of the Act, 21 U.S.C. section 360bbb-3(b)(1), unless the authorization is terminated or revoked sooner.  Performed at [facility], [place] [address], [place], [place] [address]   Culture, Urine     Status: None   Collection Time: 03/08/20 10:21 PM   Specimen: Urine, Random  Result Value Ref Range Status   Specimen Description URINE, RANDOM  Final   Special Requests NONE  Final   Culture   Final    NO GROWTH Performed at [facility], [place] [address], [place], [place] [address]    Report Status 03/09/2020 FINAL  Final  MRSA PCR Screening     Status: None   Collection Time: 03/11/20  6:34 AM   Specimen: Nasopharyngeal  Result Value Ref Range Status   MRSA by PCR NEGATIVE NEGATIVE Final    Comment:        The GeneXpert MRSA Assay (FDA approved for NASAL specimens only), is one component of a comprehensive MRSA colonization surveillance program. It  is not intended to diagnose MRSA infection nor to guide or monitor treatment for MRSA infections. Performed at [facility], [place] [address] [place], [place] [address]      Radiology Studies: DG Chest Port 1 View  Result Date: 03/14/2020 CLINICAL DATA:  Acute respiratory failure. EXAM: PORTABLE CHEST 1 VIEW COMPARISON:  03/13/2020 FINDINGS: Improvement in right upper lobe atelectasis. There remains mild airspace disease in the right upper lobe and right lower lobe. No effusion Negative for heart failure or edema.  Left lung clear. IMPRESSION: Improvement in right upper lobe atelectasis. Persistent mild airspace disease right upper lobe and right lower lobe. Electronically Signed   By: Franchot Gallo M.D.   On: 03/14/2020 08:17    Scheduled Meds: . amLODipine  10 mg Oral Daily  . Chlorhexidine Gluconate Cloth  6 each Topical Daily  . citalopram  20 mg Oral Daily  . donepezil  10 mg Oral QHS  . folic acid  1 mg Oral Daily  . insulin aspart  0-5 Units Subcutaneous QHS  . insulin aspart  0-9 Units Subcutaneous TID WC  . memantine  10 mg Oral BID  . metoprolol tartrate  50 mg Oral BID  . pantoprazole  40 mg Oral Daily  . potassium chloride  40  mEq Oral BID  . rosuvastatin  40 mg Oral q1800  . sodium chloride HYPERTONIC  4 mL Nebulization TID  . vitamin B-12  1,000 mcg Oral Daily   Continuous Infusions: . sodium chloride 1,000 mL (03/15/20 1025)  . sodium chloride    .  ceFAZolin (ANCEF) IV 1 g (03/15/20 0624)  . clevidipine 15 mg/hr (03/15/20 1225)  . magnesium sulfate bolus IVPB       LOS: 6 days   Marylu Lund, MD [facility] Pager On Amion  If 7PM-7AM, please contact night-coverage 03/15/2020, 1:57 PM

## 2020-03-15 NOTE — Progress Notes (Signed)
[facility] [place]  Subjective  - no acute events overnight.  Drain in place.  On cleviprex still.    Objective 123/67 72 97.8 F (36.6 C) (Oral) 16 100%  Intake/Output Summary (Last 24 hours) at 03/15/2020 0810 Last data filed at 03/15/2020 0630 Gross per 24 hour  Intake 584.43 ml  Output 3420 ml  Net -2835.57 ml   Cranial nerves II through XII grossly intact. No upper or lower extremity deficits. Drain in left endarterectomy site tunneled to the neck with bloody drainage, left neck hematoma remains soft   Laboratory Lab Results: Recent Labs    03/14/20 0318 03/15/20 0445  WBC 10.2 9.7  HGB 9.0* 9.3*  HCT 27.9* 28.4*  PLT 195 208   BMET Recent Labs    03/14/20 0318 03/15/20 0445  NA 142 140  K 3.5 3.2*  CL 110 108  CO2 25 25  GLUCOSE 119* 126*  BUN 8 5*  CREATININE 1.02 0.96  CALCIUM 8.5* 8.8*    COAG Lab Results  Component Value Date   INR 0.9 03/11/2020   INR 0.9 03/08/2020   INR 1.02 04/29/2013   No results found for: PTT  Assessment/Planning:  74 year old male postop day 4 status post left carotid endarterectomy. He required washout of a left neck hematoma Saturday morning and drain placement. When he got to the floor after surgery he had profound volatile blood pressure with systolics over 315 and reaccumulated another neck hematoma. Moved to the ICU on a Cleviprex drip that has been maxed out. Neck hematoma continues to look softer today.Drain output 30 mL past 24 hours but still bloody again today.  Leave drain until tomorrow.  Added ancef for prophylaxis since drain remains.    Appreciate critical care and medicine input.  Started on metoprolol and added norvasc yesterday and trying to wean cleviprex.  Still on cleviprex.  Hopefully remove drain tomorrow.  ICU until off cleviprex.    Marty Heck, MD [facility] [place] Office: [phone]    .Marty Heck 03/15/2020 8:10  AM --

## 2020-03-15 NOTE — Progress Notes (Signed)
Critical Care Note    03/15/2020 7:13 AM 4 Days Post-Op  Subjective:  Mild headache. No vision disturbances. Tolerating clear liquids. Ambulating with RW. Neck discomfort continues to improve.   Vitals:   03/15/20 0615 03/15/20 0630  BP:  117/62  Pulse: 73 78  Resp: 12 14  Temp:    SpO2: 397% 673%   Systolic BP: 419F-790W; diastolic: 40X-73Z HR: 32-99 UOP: 340cc Drain output: 20cc Tele: a. fib Physical Exam: Cardiac:  RRR Lungs:  CTAB Incisions:  Well approximated. Non-expanding hematoma. Drain output is thin, bloody Extremities:  Moves all well. 5/5 grip strength Abdomen:  Soft, ND Neuro: A and O times 4. CN 2-12 intact. Tongue is midline  CBC    Component Value Date/Time   WBC 9.7 03/15/2020 0445   RBC 3.04 (L) 03/15/2020 0445   HGB 9.3 (L) 03/15/2020 0445   HCT 28.4 (L) 03/15/2020 0445   PLT 208 03/15/2020 0445   MCV 93.4 03/15/2020 0445   MCH 30.6 03/15/2020 0445   MCHC 32.7 03/15/2020 0445   RDW 12.9 03/15/2020 0445   LYMPHSABS 1.9 03/11/2020 0139   MONOABS 0.8 03/11/2020 0139   EOSABS 0.4 03/11/2020 0139   BASOSABS 0.0 03/11/2020 0139    BMET    Component Value Date/Time   NA 140 03/15/2020 0445   K 3.2 (L) 03/15/2020 0445   CL 108 03/15/2020 0445   CO2 25 03/15/2020 0445   GLUCOSE 126 (H) 03/15/2020 0445   BUN 5 (L) 03/15/2020 0445   CREATININE 0.96 03/15/2020 0445   CALCIUM 8.8 (L) 03/15/2020 0445   GFRNONAA >60 03/15/2020 0445   GFRAA >60 03/15/2020 0445     Intake/Output Summary (Last 24 hours) at 03/15/2020 0713 Last data filed at 03/15/2020 0630 Gross per 24 hour  Intake 618.11 ml  Output 3420 ml  Net -2801.89 ml    HOSPITAL MEDICATIONS Scheduled Meds: . amLODipine  10 mg Oral Daily  . Chlorhexidine Gluconate Cloth  6 each Topical Daily  . citalopram  20 mg Oral Daily  . donepezil  10 mg Oral QHS  . folic acid  1 mg Oral Daily  . insulin aspart  0-5 Units Subcutaneous QHS  . insulin aspart  0-9 Units Subcutaneous TID WC  .  memantine  10 mg Oral BID  . metoprolol tartrate  50 mg Oral BID  . pantoprazole  40 mg Oral Daily  . rosuvastatin  40 mg Oral q1800  . sodium chloride HYPERTONIC  4 mL Nebulization TID  . vitamin B-12  1,000 mcg Oral Daily   Continuous Infusions: . sodium chloride Stopped (03/11/20 0532)  . sodium chloride    . sodium chloride Stopped (03/14/20 1703)  .  ceFAZolin (ANCEF) IV 1 g (03/15/20 0624)  . clevidipine 17 mg/hr (03/15/20 0450)  . magnesium sulfate bolus IVPB     PRN Meds:.sodium chloride, sodium chloride, acetaminophen **OR** acetaminophen (TYLENOL) oral liquid 160 mg/5 mL **OR** acetaminophen, albuterol, alum & mag hydroxide-simeth, bisacodyl, labetalol, magnesium sulfate bolus IVPB, metoprolol tartrate, morphine injection **OR** oxyCODONE-acetaminophen, ondansetron, phenol, promethazine  Systems Assessment: Hemodynamics: Continues to require IV BP support. Rate controlled Pulmonary: O2 supp via mask while sleeping due to likely OSA. Lungs clear. Renal: excellent UOP; normal SCr Vascular/Neuro: Intact. Hgb remains stable. Neck hematoma remains fairly soft. ID: afebrile, normal WBCs. On Ancef while drain in place Nutrition: tolerating clears  74 yo male presented with acute ischemic CVA. POD 4 left CEA with development of large neck hematoma and uncontrolled hypertension. Returned  to OR POD 1 for washout. Acute onset of rate-controlled a. Fib POD 3. Acute blood loss anemia>stable; has not required transfusion.  Plan: -Plan to leave drain in place another 24 hours, monitor neck hematoma and continue Ancef.  Advance diet. Hold antiplatelet/anticoag another 24 hours. Bp meds as per CCM/hospitalists. Appreciate management. -DVT prophylaxis:  SCDs   Risa Grill, PA-C [facility] [phone] 03/15/2020  7:13 AM

## 2020-03-15 NOTE — Progress Notes (Signed)
NAME:  [name] [name], MRN:  [id number], DOB:  February 03, 1946, LOS: 6 ADMISSION DATE:  03/08/2020, CONSULTATION DATE:  03/15/2020  REFERRING MD:  Wyline Copas, triad, CHIEF COMPLAINT:  resp distress   Brief History   74 year old man admitted 9/7 with  left frontal and occipital CVA , underwent left CEA 9/10 , required left neck washout for hematoma.  Transferred to ICU for blood pressure management developed hypoxia postop with right upper lobe atelectasis, hence PCCM consulted   Past Medical History  Diabetes type 2 Hypertension Hyperlipidemia  Significant Hospital Events   9/10 left CEA 9/11 transferred to ICU post evacuation of left hematoma for blood pressure management 9/12 improved aeration right upper lobe  Consults:  Vascular  Procedures:    Significant Diagnostic Tests:  MRI brain 9/7 small acute infarcts left frontal and occipital lobes, moderate cerebral atrophy, sequelae of remote bilateral basal ganglia thalamic and cerebellar insults MRI cervical and thoracic spine 9/7>> mild cervical spondylosis  CT angiogram neck 9/7 >> 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 30% luminal narrowing of the innominate secondary to atheromatous plaque.  Approximately 50% narrowing of the left carotid bulb and 80-90% narrowing of the proximal left ICA secondary to atheromatous plaque.  Micro Data:    Antimicrobials:     Interim history/subjective:  Weaned to 3 L [place] with adequate sats Remains on Cleviprex gtt with new systolic goal of 923 systolic ( per vascular surgery) Will start weaning to see if any additional antihypertensives needed Hypo K  Objective   Blood pressure (!) 128/43, pulse 73, temperature 97.8 F (36.6 C), temperature source Oral, resp. rate 15, height 5\' 9"  (1.753 m), weight 88.7 kg, SpO2 97 %.        Intake/Output Summary (Last 24 hours) at 03/15/2020 1019 Last data filed at 03/15/2020 0630 Gross per 24 hour  Intake 512.56 ml    Output 3220 ml  Net -2707.44 ml   Filed Weights   03/09/20 0201 03/14/20 0600  Weight: 84.1 kg 88.7 kg    Examination: General: Elderly man, no distress, on 3 L nasal cannula HENT: Mild pallor, no icterus, left neck hematoma appears soft, no increase, JP drain minimal sero sang, voice is stronger Lungs: Bilateral chest excursion, Clear throughout, diminished per bases Cardiovascular: S1-S2 , RRR, No RMG Abdomen: Soft nontender abdomen, BS +, ND Extremities: No rash, no edema Neuro: Awake, alert, interactive, MAE x 4, Follows commands , nonfocal Strong cough, Using IS  Labs show stable anemia, mild hypokalemia,  Continuing improved renal function  Resolved Hospital Problem list     Assessment & Plan:  Acute respiratory failure related to right upper lobe atelectasis likely from aspirated blood due to airway bleeding,?  Traumatic intubation for emergent surgery Work of breathing is acceptable, oxygen requirements decreasing  - Aggressive pulmonary Toilet -Continue incentive spirometry , continue to wean  FiO2 -Hypertonic saline nebs -Mobilize in a chair or upright - trend CXR  Hypertensive urgency - New Cleviprex SBP goal is 300 systolic - Continue t metoprolol 50 twice daily - continue amlodipine 10 mg and taper Cleviprex to off - Continue Lopressor 50 BID - Lobetalol and Lopressor prn for SBP < 762 systolic - Will add additional oral agents as needed to maintain systolic BP goal of 263  Acute CVA Hematoma post left CEA -aspirin and Plavix can be resumed per vascular -Mobilize per PT  PCCM will be available as needed   Best practice:  Diet: Diet per Primary Pain/Anxiety/Delirium protocol (  if indicated): N/A VAP protocol (if indicated): N/A DVT prophylaxis: SCDs GI prophylaxis: Protonix Glucose control: SSI Mobility: Bedrest Code Status: Full Family Communication: per primary Disposition: ICU  Labs   CBC: Recent Labs  Lab 03/08/20 1613 03/09/20 1000  03/10/20 0423 03/10/20 0423 03/11/20 0139 03/11/20 0139 03/12/20 0408 03/12/20 0408 03/12/20 9983 03/12/20 1130 03/13/20 0500 03/14/20 0318 03/15/20 0445  WBC 12.7*   < > 8.5   < > 7.1   < > 9.4  --   --  12.7* 9.5 10.2 9.7  NEUTROABS 10.6*  --  5.8  --  3.9  --   --   --   --   --   --   --   --   HGB 14.0   < > 12.4*   < > 12.4*   < > 10.6*   < > 9.5* 10.0* 8.9* 9.0* 9.3*  HCT 41.7   < > 37.6*   < > 36.5*   < > 32.2*   < > 28.0* 30.7* 27.7* 27.9* 28.4*  MCV 91.0   < > 91.9   < > 91.0   < > 91.0  --   --  94.5 95.2 96.5 93.4  PLT 187   < > 148*   < > 163   < > 173  --   --  201 191 195 208   < > = values in this interval not displayed.    Basic Metabolic Panel: Recent Labs  Lab 03/09/20 0338 03/09/20 1000 03/10/20 0423 03/10/20 0423 03/11/20 0139 03/11/20 0139 03/12/20 0408 03/12/20 0823 03/13/20 0500 03/14/20 0318 03/14/20 0328 03/15/20 0445  NA  --    < > 140   < > 140   < > 140 143 140 142  --  140  K  --    < > 3.4*   < > 3.8   < > 4.1 3.7 3.7 3.5  --  3.2*  CL  --    < > 107   < > 105  --  109  --  109 110  --  108  CO2  --    < > 24   < > 24  --  21*  --  24 25  --  25  GLUCOSE  --    < > 106*   < > 90  --  145*  --  142* 119*  --  126*  BUN  --    < > 12   < > 13  --  12  --  8 8  --  5*  CREATININE  --    < > 1.20   < > 1.19  --  1.24  --  1.13 1.02  --  0.96  CALCIUM  --    < > 9.3   < > 9.6  --  8.8*  --  8.4* 8.5*  --  8.8*  MG 2.2  --  2.0  --  2.0  --   --   --   --   --  1.9  --   PHOS  --   --  2.7  --  3.4  --   --   --   --   --   --   --    < > = values in this interval not displayed.   GFR: Estimated Creatinine Clearance: 74.4 mL/min (by C-G formula based on SCr of 0.96 mg/dL). Recent Labs  Lab  03/12/20 1130 03/13/20 0500 03/14/20 0318 03/15/20 0445  WBC 12.7* 9.5 10.2 9.7   CC APP Time 30 minutes   Magdalen Spatz, MSN, AGACNP-BC [place] for personal pager PCCM on call pager [phone]  03/15/2020 10:19 AM

## 2020-03-16 ENCOUNTER — Inpatient Hospital Stay (HOSPITAL_COMMUNITY): Payer: Medicare PPO

## 2020-03-16 DIAGNOSIS — J9601 Acute respiratory failure with hypoxia: Secondary | ICD-10-CM

## 2020-03-16 DIAGNOSIS — S1093XA Contusion of unspecified part of neck, initial encounter: Secondary | ICD-10-CM

## 2020-03-16 DIAGNOSIS — I63239 Cerebral infarction due to unspecified occlusion or stenosis of unspecified carotid arteries: Secondary | ICD-10-CM

## 2020-03-16 HISTORY — DX: Acute respiratory failure with hypoxia: J96.01

## 2020-03-16 HISTORY — DX: Cerebral infarction due to unspecified occlusion or stenosis of unspecified carotid artery: I63.239

## 2020-03-16 LAB — BASIC METABOLIC PANEL
Anion gap: 8 (ref 5–15)
BUN: 8 mg/dL (ref 8–23)
CO2: 26 mmol/L (ref 22–32)
Calcium: 9.1 mg/dL (ref 8.9–10.3)
Chloride: 108 mmol/L (ref 98–111)
Creatinine, Ser: 0.94 mg/dL (ref 0.61–1.24)
GFR calc Af Amer: >60 mL/min (ref >60)
GFR calc non Af Amer: >60 mL/min (ref >60)
Glucose, Bld: 116 mg/dL — ABNORMAL HIGH (ref 70–99)
Potassium: 3.9 mmol/L (ref 3.5–5.1)
Sodium: 142 mmol/L (ref 135–145)

## 2020-03-16 LAB — CBC
HCT: 29.7 % — ABNORMAL LOW (ref 39.0–52.0)
Hemoglobin: 9.5 g/dL — ABNORMAL LOW (ref 13.0–17.0)
MCH: 29.6 pg (ref 26.0–34.0)
MCHC: 32 g/dL (ref 30.0–36.0)
MCV: 92.5 fL (ref 80.0–100.0)
Platelets: 272 10*3/uL (ref 150–400)
RBC: 3.21 MIL/uL — ABNORMAL LOW (ref 4.22–5.81)
RDW: 12.8 % (ref 11.5–15.5)
WBC: 7.5 10*3/uL (ref 4.0–10.5)
nRBC: 0 % (ref 0.0–0.2)

## 2020-03-16 LAB — MAGNESIUM: Magnesium: 2 mg/dL (ref 1.7–2.4)

## 2020-03-16 LAB — GLUCOSE, CAPILLARY
Glucose-Capillary: 148 mg/dL — ABNORMAL HIGH (ref 70–99)
Glucose-Capillary: 179 mg/dL — ABNORMAL HIGH (ref 70–99)
Glucose-Capillary: 104 mg/dL — ABNORMAL HIGH (ref 70–99)
Glucose-Capillary: 202 mg/dL — ABNORMAL HIGH (ref 70–99)
Glucose-Capillary: 112 mg/dL — ABNORMAL HIGH (ref 70–99)

## 2020-03-16 IMAGING — DX DG CHEST 1V PORT
1 series · 1 of 1 positions shown · non-contrast
Comparison: [DATE].

CLINICAL DATA: Shortness of breath.

EXAM:
PORTABLE CHEST 1 VIEW

[chest ap]
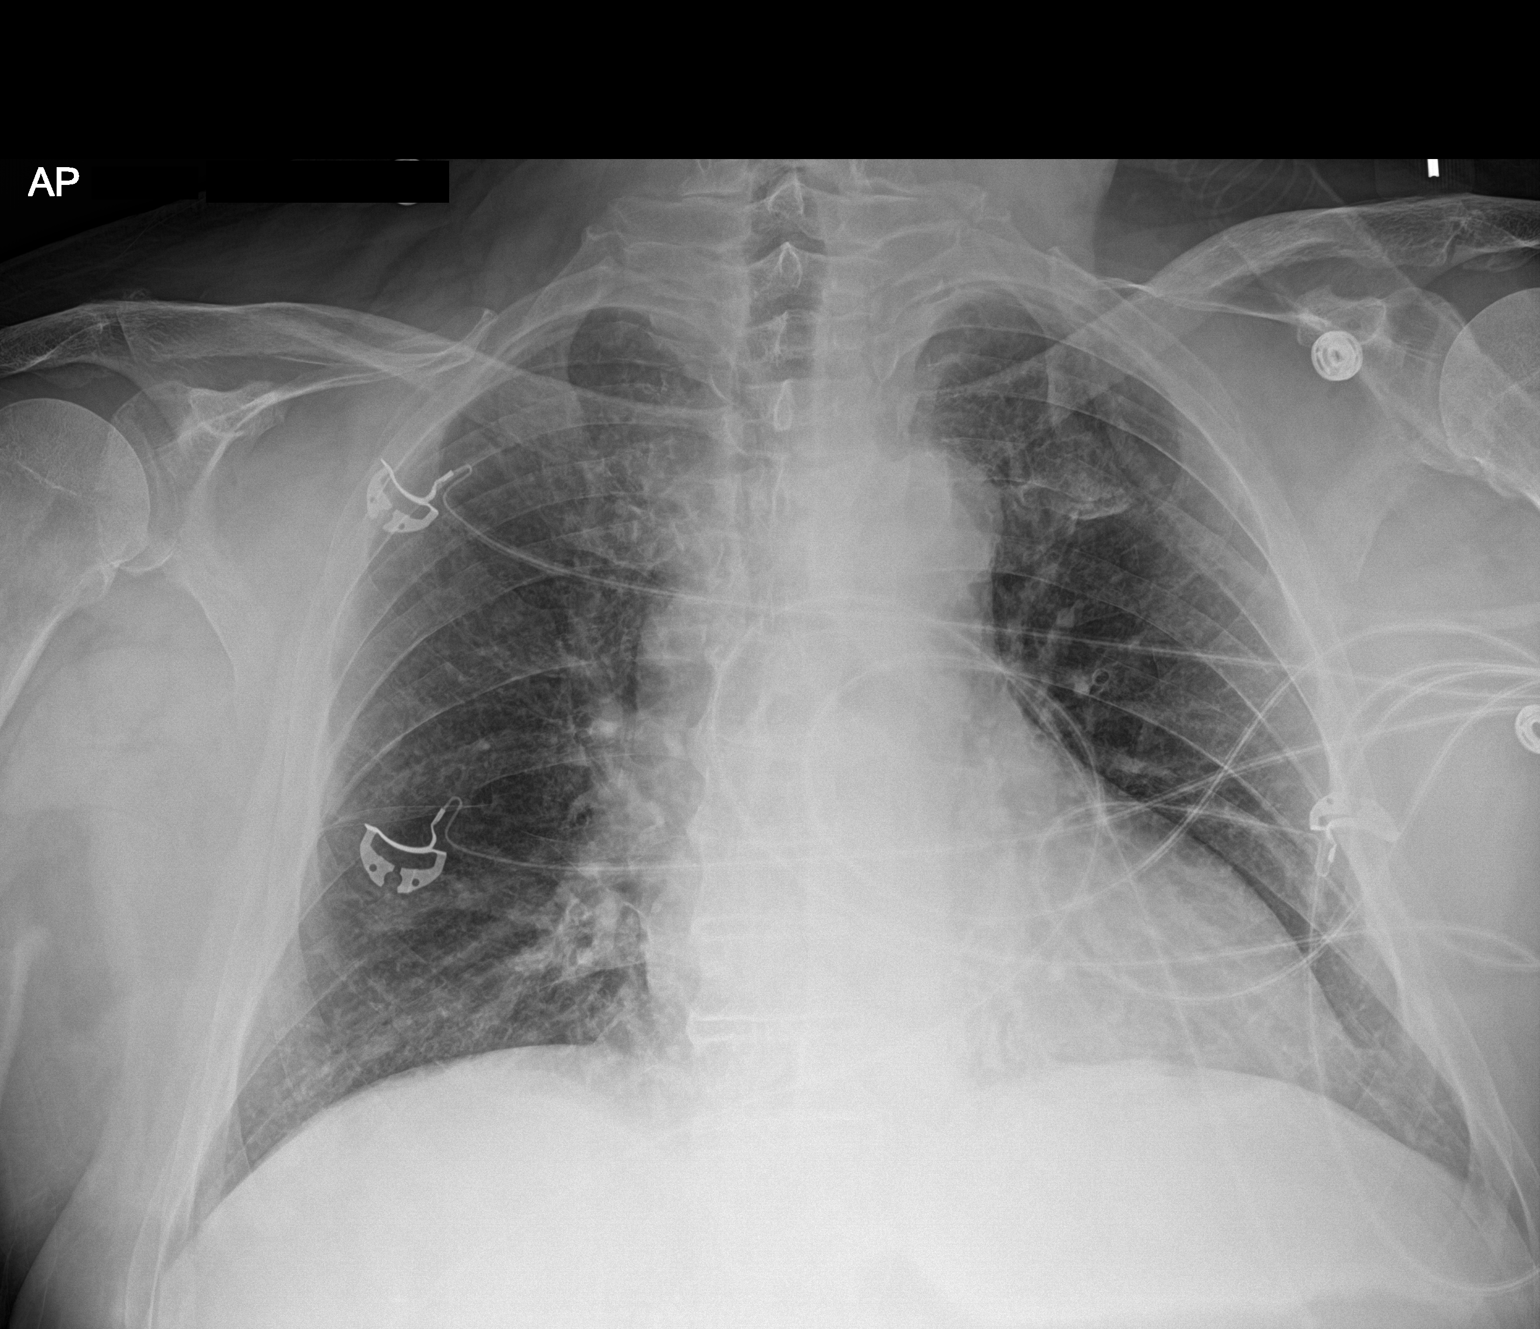

[1 of 1 positions shown; findings below may reference images not displayed]

FINDINGS: Stable cardiomediastinal silhouette. No pneumothorax or pleural
effusion is noted. Significantly improved bilateral lung opacities
are noted. Bony thorax is unremarkable.
IMPRESSION: Significantly improved bilateral lung opacities compared to prior
exam.

## 2020-03-16 MED ORDER — CARVEDILOL 12.5 MG PO TABS
12.5000 mg | ORAL_TABLET | Freq: Two times a day (BID) | ORAL | Status: DC
  Administered 2020-03-16 – 2020-03-17 (×2): 12.5 mg via ORAL
  Filled 2020-03-16 (×2): qty 1

## 2020-03-16 MED ORDER — ROSUVASTATIN CALCIUM 20 MG PO TABS: 40.0000 mg | ORAL_TABLET | Freq: Every day | ORAL | Status: DC

## 2020-03-16 MED ORDER — ROSUVASTATIN CALCIUM 20 MG PO TABS
40.0000 mg | ORAL_TABLET | Freq: Every day | ORAL | Status: DC
  Administered 2020-03-16 – 2020-03-22 (×6): 40 mg via ORAL
  Filled 2020-03-16 (×6): qty 2

## 2020-03-16 MED ORDER — LOSARTAN POTASSIUM 25 MG PO TABS
25.0000 mg | ORAL_TABLET | Freq: Every day | ORAL | Status: DC
  Administered 2020-03-16: 25 mg via ORAL
  Filled 2020-03-16: qty 1

## 2020-03-16 MED ORDER — ASPIRIN 81 MG PO CHEW: 81.0000 mg | CHEWABLE_TABLET | Freq: Every day | ORAL | Status: DC

## 2020-03-16 NOTE — Progress Notes (Addendum)
Attempted to wean Cleviprex through shift, but SBP  extremely sensitive to titrations.  Titrated between 5-16 throughout shift for SBP <140.  -Post-op carotid JP site intact.  Drain removed by Vascular this AM at bedside. Moderate bloody drainage Noted through shift. Dressing applied/ manual pressure held to hemostasis. Dressing changed PRN.   -Endarterectomy site: Manual pressure held until hemostasis achieved, swelling and hematoma subsided.   Pressure dressing applied (Betadine, Xeroform, Gauze and Medipore) and pressure bag applied on top of site after manual pressure held.   Vascular and CCM notified; both to bedside.  Vascular held manual pressure for approx 15 min.  No new orders - stated c/w current care and monitor closely. Site hematoma  bordered-resolving through shift, soft, Pulses 2+. NSR-SB.  (Reviewed w/J.Ruthann Cancer, DO at bedside)  Neurologically intact. NIHSS 0. C/O H/A 8/10 through shift managed well w/PRNs (see MAR)   Tolerating RA w/o C/O SOB.  Doing IS as ordered w/o c/o SOB, Dyspnea, CP, Dizziness.  Productive cough-minimal tan/white sputum.  Wife at bedside, updated.

## 2020-03-16 NOTE — Progress Notes (Addendum)
  Progress Note    03/16/2020 7:00 AM 4 Days Post-Op  Subjective:  No complaints; sitting up in chair eating breakfast; says he dreamed last night but not very good ones.  Denies difficulty swallowing.    Afebrile HR 60's-70's afib 49'F-026'V systolic 78% .40 FiO2 venturi mask  Gtts:  None  Vitals:   03/16/20 0545 03/16/20 0600  BP:    Pulse:  71  Resp: 12 11  Temp:    SpO2:  97%     Physical Exam: Neuro:  In tact; tongue is midline Lungs:  Non labored Incision:  Clean and dry; moderate hematoma but is soft.  There is ecchymosis around the neck.    CBC    Component Value Date/Time   WBC 7.5 03/16/2020 0323   RBC 3.21 (L) 03/16/2020 0323   HGB 9.5 (L) 03/16/2020 0323   HCT 29.7 (L) 03/16/2020 0323   PLT 272 03/16/2020 0323   MCV 92.5 03/16/2020 0323   MCH 29.6 03/16/2020 0323   MCHC 32.0 03/16/2020 0323   RDW 12.8 03/16/2020 0323   LYMPHSABS 1.9 03/11/2020 0139   MONOABS 0.8 03/11/2020 0139   EOSABS 0.4 03/11/2020 0139   BASOSABS 0.0 03/11/2020 0139    BMET    Component Value Date/Time   NA 142 03/16/2020 0323   K 3.9 03/16/2020 0323   CL 108 03/16/2020 0323   CO2 26 03/16/2020 0323   GLUCOSE 116 (H) 03/16/2020 0323   BUN 8 03/16/2020 0323   CREATININE 0.94 03/16/2020 0323   CALCIUM 9.1 03/16/2020 0323   GFRNONAA >60 03/16/2020 0323   GFRAA >60 03/16/2020 0323     Intake/Output Summary (Last 24 hours) at 03/16/2020 0700 Last data filed at 03/16/2020 0400 Gross per 24 hour  Intake 1741.35 ml  Output 2314 ml  Net -572.65 ml     Assessment/Plan:  This is a 74 y.o. male who is s/p left CEA 4 Days Post-Op  -pt is doing well this am. -pt neuro exam is in tact -still requiring Cleviprex-continue to wean per primary team. -JP with only 20cc out/24hr.  Most likely can discontinue but will defer to Dr. Carlis Abbott.   -acute surgical blood loss anemia is stable and continues to improve.     Leontine Locket, PA-C [facility] [phone]  I have seen and evaluated the patient. I agree with the PA note as documented above. POD#4 s/p L CEA for symptomatic high grade stenosis.  Post-op hypertensive emergency, ultimately required washout hematoma in OR and ongoing issues with BP.  Left neck drain removed this am - only 20 mL output past 24 hours and serosanguinous.  Neuro intact.  On metoprolol and amlodipine to try and wean cleviprex.  Still on 10 cleviprex today and appreciate CCU and Medicine assistant to get BP controlled.  Neck hematoma soft and stable since Saturday.  Marty Heck, MD [facility] [place] Office: [phone]

## 2020-03-16 NOTE — Progress Notes (Signed)
[facility]  PROGRESS NOTE  [name] [name] OHY:[id number] DOB: 1945/07/06 DOA: 03/08/2020 PCP: [name], MD Admit date - 03/08/2020   Admitting Physician A Melven Sartorius., MD  Outpatient Primary MD for the patient is [name], MD  LOS - 7 Brief Narrative   [name] [name] is a 74 y.o. year old male with medical history significant for memory loss, chronic kidney disease, type 2 diabetes mellitus, AAA, hypertension, and hyperlipidemiawho presented on 03/08/2020 with confusion, hypoglycemia, and a fall.MRI showed stroke involving the L frontal and ocipital lobes. He was noted to have L carotid artery disease and vascular surgery was c/s for CEA. S/p CEA on 9/10. Developed large post-procedural hematoma requiring evacuation of hematoma    Subjective  Today feels well.  No chest pain, no cough.  A & P   Acute left frontal/occipital lobe CVA -PT/OT recommending 24-hour assist -Currently off anticoagulation/antiplatelet in the setting of large hematoma addressed more below -We will restart aspirin 81 mg on 9/16,  discussed with vascular surgery, will closely monitor for any changes in size of known hematoma   Paroxysmal atrial fibrillation, new.  CHA2DS2-VASc: 5.  Remains rate controlled -Currently holding off on anticoagulation in the setting of hematoma addressed more below -Vascular surgery okay with low-dose aspirin, still high risk for bleed given moderate size hematoma will hold off on any anticoagulation  Symptomatic high-grade stenosis, status post left CEA (9/10), postop day #4.  Complicated by postoperative hypertensive emergency and required washout hematoma in OR, stable. -Left knee drain removed by vascular surgery on 9/15 -Continue to wean Cleviprex -Continue to monitor blood pressure on metoprolol and amlodipine.  Monitor metoprolol, noted pain  Hypertension, improving. Hypertensive urgency (resolved) SBP in the 120s on Cleviprex drip, loaded pain,  metoprolol.  Blood pressure tolerating weaning of Cleviprex started today -Continue to wean Cleviprex as guided by PCCM, goal SBP less than 140 -Continue amlodipine 10 mg, add losartan 25 mg, discontinue metoprolol in favor of Coreg 12.5 mg twice daily, appreciate PCCM guidance  Type 2 diabetes.  A1c 6.8, CBGs at goal -Sliding scale, monitor CBG, holding home Amaryl  HLD.  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  Suspect aspirated blood due to airway bleeding during postoperative hematoma/traumatic intubation for emergent surgery.  Currently O2 requirements decreasing, currently 5 L O2 with normal respiratory effort.  Repeat chest x-ray on 9/18 shows improvement in bilateral lung opacities -Continue to wean O2, goal SPO2 greater than 92 percent -Continue to encourage incentive spirometry -Appreciate PCCM recommendations, hypertonic saline nebs, mobilize in chair, monitor chest x-ray  Renal insufficiency, resolved.  Creatinine back at baseline -Continue closely monitor  Acute blood loss anemia, in setting of postoperative hematoma from left CEA.  Remained stable. -Continue to monitor hemoglobin as needed  Family Communication  : None at bedside  Code Status : Full  Disposition Plan  :  Patient is from home. Anticipated d/c date: 2 to 3 days. Barriers to d/c or necessity for inpatient status:  Requiring IV Cleviprex, currently weaning, chronic supplemental O2 at 5 L, currently needing.  Monitor hematoma size no other drainage removed with initiation of aspirin on 9/16 Consults  : PCCM, vascular surgery, neurology  Procedures  : Left CEA 9/10, hematoma evacuation 9/11  DVT Prophylaxis  : SCDs Lab Results  Component Value Date   PLT  272 03/16/2020    Diet :  Diet Order            Diet regular Room service  appropriate? Yes; Fluid consistency: Thin  Diet effective now                  Inpatient Medications Scheduled Meds: . amLODipine  10 mg Oral Daily  . carvedilol  12.5 mg Oral BID WC  . Chlorhexidine Gluconate Cloth  6 each Topical Daily  . citalopram  20 mg Oral Daily  . donepezil  10 mg Oral QHS  . folic acid  1 mg Oral Daily  . insulin aspart  0-5 Units Subcutaneous QHS  . insulin aspart  0-9 Units Subcutaneous TID WC  . losartan  25 mg Oral Daily  . memantine  10 mg Oral BID  . pantoprazole  40 mg Oral Daily  . rosuvastatin  40 mg Oral q1800  . vitamin B-12  1,000 mcg Oral Daily   Continuous Infusions: . sodium chloride 10 mL/hr at 03/16/20 1200  . sodium chloride    .  ceFAZolin (ANCEF) IV 1 g (03/16/20 1334)  . clevidipine 15 mg/hr (03/16/20 1353)  . magnesium sulfate bolus IVPB     PRN Meds:.sodium chloride, sodium chloride, acetaminophen **OR** acetaminophen (TYLENOL) oral liquid 160 mg/5 mL **OR** acetaminophen, albuterol, alum & mag hydroxide-simeth, bisacodyl, labetalol, magnesium sulfate bolus IVPB, metoprolol tartrate, morphine injection **OR** oxyCODONE-acetaminophen, ondansetron, phenol, promethazine  Antibiotics  :   Anti-infectives (From admission, onward)   Start     Dose/Rate Route Frequency Ordered Stop   03/14/20 1600  ceFAZolin (ANCEF) IVPB 1 g/50 mL premix        1 g 100 mL/hr over 30 Minutes Intravenous Every 8 hours 03/14/20 1516     03/11/20 1800  ceFAZolin (ANCEF) IVPB 2g/100 mL premix        2 g 200 mL/hr over 30 Minutes Intravenous Every 8 hours 03/11/20 1740 03/12/20 0138       Objective   Vitals:   03/16/20 1245 03/16/20 1300 03/16/20 1315 03/16/20 1330  BP: (!) 105/58 (!) 117/47 (!) 123/58 119/65  Pulse: 70 70 71 68  Resp: 18 12 12 13   Temp:      TempSrc:      SpO2: 95% 92% 95% 94%  Weight:      Height:        SpO2: 94 % O2 Flow Rate (L/min): 2 L/min FiO2 (%): 40 %  Wt Readings from Last 3 Encounters:  03/14/20 88.7 kg    12/28/19 86.5 kg  08/06/18 91.2 kg     Intake/Output Summary (Last 24 hours) at 03/16/2020 1357 Last data filed at 03/16/2020 1200 Gross per 24 hour  Intake 1963.23 ml  Output 3119 ml  Net -1155.77 ml    Physical Exam:     Awake Alert, Oriented X 3, Normal affect No new F.N deficits,  [place].AT, Incision site on left lateral neck clean, dry, intact, drain removed Normal respiratory effort on 5 L nasal cannula, CTAB RRR,No Gallops,Rubs or new Murmurs,  +ve B.Sounds, Abd Soft, No tenderness, No rebound, guarding or rigidity.    I have personally reviewed the following:   Data Reviewed:  CBC Recent Labs  Lab 03/10/20 0423 03/10/20 0423 03/11/20 0139 03/12/20 0408 03/12/20 1130 03/13/20 0500 03/14/20 0318 03/15/20 0445 03/16/20 0323  WBC 8.5   < > 7.1   < > 12.7* 9.5 10.2 9.7 7.5  HGB 12.4*   < > 12.4*   < >  10.0* 8.9* 9.0* 9.3* 9.5*  HCT 37.6*   < > 36.5*   < > 30.7* 27.7* 27.9* 28.4* 29.7*  PLT 148*   < > 163   < > 201 191 195 208 272  MCV 91.9   < > 91.0   < > 94.5 95.2 96.5 93.4 92.5  MCH 30.3   < > 30.9   < > 30.8 30.6 31.1 30.6 29.6  MCHC 33.0   < > 34.0   < > 32.6 32.1 32.3 32.7 32.0  RDW 13.1   < > 12.9   < > 13.2 13.5 13.5 12.9 12.8  LYMPHSABS 1.4  --  1.9  --   --   --   --   --   --   MONOABS 1.0  --  0.8  --   --   --   --   --   --   EOSABS 0.3  --  0.4  --   --   --   --   --   --   BASOSABS 0.0  --  0.0  --   --   --   --   --   --    < > = values in this interval not displayed.    Chemistries  Recent Labs  Lab 03/10/20 0423 03/10/20 0423 03/11/20 0139 03/11/20 0139 03/12/20 0408 03/12/20 0408 03/12/20 8676 03/13/20 0500 03/14/20 0318 03/14/20 0328 03/15/20 0445 03/16/20 0323  NA 140   < > 140   < > 140   < > 143 140 142  --  140 142  K 3.4*   < > 3.8   < > 4.1   < > 3.7 3.7 3.5  --  3.2* 3.9  CL 107   < > 105   < > 109  --   --  109 110  --  108 108  CO2 24   < > 24   < > 21*  --   --  24 25  --  25 26  GLUCOSE 106*   < > 90   < >  145*  --   --  142* 119*  --  126* 116*  BUN 12   < > 13   < > 12  --   --  8 8  --  5* 8  CREATININE 1.20   < > 1.19   < > 1.24  --   --  1.13 1.02  --  0.96 0.94  CALCIUM 9.3   < > 9.6   < > 8.8*  --   --  8.4* 8.5*  --  8.8* 9.1  MG 2.0  --  2.0  --   --   --   --   --   --  1.9  --  2.0  AST 31  --  26  --   --   --   --  15  --   --  17  --   ALT 33  --  33  --   --   --   --  12  --   --  12  --   ALKPHOS 98  --  116  --   --   --   --  80  --   --  96  --   BILITOT 1.0  --  0.9  --   --   --   --  0.5  --   --  0.5  --    < > = values in this interval not displayed.   ------------------------------------------------------------------------------------------------------------------ Recent Labs    03/14/20 0318  TRIG 144    Lab Results  Component Value Date   HGBA1C 6.8 (H) 03/09/2020   ------------------------------------------------------------------------------------------------------------------ No results for input(s): TSH, T4TOTAL, T3FREE, THYROIDAB in the last 72 hours.  Invalid input(s): FREET3 ------------------------------------------------------------------------------------------------------------------ No results for input(s): VITAMINB12, FOLATE, FERRITIN, TIBC, IRON, RETICCTPCT in the last 72 hours.  Coagulation profile Recent Labs  Lab 03/11/20 0139  INR 0.9    No results for input(s): DDIMER in the last 72 hours.  Cardiac Enzymes No results for input(s): CKMB, TROPONINI, MYOGLOBIN in the last 168 hours.  Invalid input(s): CK ------------------------------------------------------------------------------------------------------------------ No results found for: BNP  Micro Results Recent Results (from the past 240 hour(s))  SARS Coronavirus 2 by RT PCR (hospital order, performed in [facility] hospital lab) Nasopharyngeal Nasopharyngeal Swab     Status: None   Collection Time: 03/08/20  5:03 PM   Specimen: Nasopharyngeal Swab  Result Value Ref Range  Status   SARS Coronavirus 2 NEGATIVE NEGATIVE Final    Comment: (NOTE) SARS-CoV-2 target nucleic acids are NOT DETECTED.  The SARS-CoV-2 RNA is generally detectable in upper and lower respiratory specimens during the acute phase of infection. The lowest concentration of SARS-CoV-2 viral copies this assay can detect is 250 copies / mL. A negative result does not preclude SARS-CoV-2 infection and should not be used as the sole basis for treatment or other patient management decisions.  A negative result may occur with improper specimen collection / handling, submission of specimen other than nasopharyngeal swab, presence of viral mutation(s) within the areas targeted by this assay, and inadequate number of viral copies (<250 copies / mL). A negative result must be combined with clinical observations, patient history, and epidemiological information.  Fact Sheet for Patients:   StrictlyIdeas.no  Fact Sheet for Healthcare Providers: BankingDealers.co.za  This test is not yet approved or  cleared by the [place] FDA and has been authorized for detection and/or diagnosis of SARS-CoV-2 by FDA under an Emergency Use Authorization (EUA).  This EUA will remain in effect (meaning this test can be used) for the duration of the COVID-19 declaration under Section 564(b)(1) of the Act, 21 U.S.C. section 360bbb-3(b)(1), unless the authorization is terminated or revoked sooner.  Performed at [facility], [place] [address] [place], [place] [address]   Culture, Urine     Status: None   Collection Time: 03/08/20 10:21 PM   Specimen: Urine, Random  Result Value Ref Range Status   Specimen Description URINE, RANDOM  Final   Special Requests NONE  Final   Culture   Final    NO GROWTH Performed at [facility], [place] [address] [place], [place] [address]    Report Status 03/09/2020 FINAL  Final  MRSA PCR Screening     Status: None    Collection Time: 03/11/20  6:34 AM   Specimen: Nasopharyngeal  Result Value Ref Range Status   MRSA by PCR NEGATIVE NEGATIVE Final    Comment:        The GeneXpert MRSA Assay (FDA approved for NASAL specimens only), is one component of a comprehensive MRSA colonization surveillance program. It is not intended to diagnose MRSA infection nor to guide or monitor treatment for MRSA infections. Performed at [facility], [place] [address] [place], [place] [address]     Radiology Reports CT Angio Head W  or Wo Contrast  Result Date: 03/08/2020 CLINICAL DATA:  Neuro deficit EXAM: CT ANGIOGRAPHY HEAD AND NECK TECHNIQUE: Multidetector CT imaging of the head and neck was performed using the standard protocol during bolus administration of intravenous contrast. Multiplanar CT image reconstructions and MIPs were obtained to evaluate the vascular anatomy. Carotid stenosis measurements (when applicable) are obtained utilizing NASCET criteria, using the distal internal carotid diameter as the denominator. CONTRAST:  58mL OMNIPAQUE IOHEXOL 350 MG/ML SOLN COMPARISON:  03/08/2020 head CT and prior. FINDINGS: CTA NECK FINDINGS Aortic arch: Standard branching. Imaged portion shows no evidence of aneurysm or dissection. Extensive noncalcified atheromatous plaque involving the aortic arch and great vessel origins. Some of the plaque demonstrates a pedunculated appearance. Approximately 30% luminal narrowing of the innominate artery secondary to atheromatous disease. No significant stenosis of the left common carotid and left subclavian artery origins. Right carotid system: Noncalcified atheromatous disease involving the proximal right common carotid artery without significant luminal narrowing. Carotid bifurcation calcified atheromatous plaque without high-grade narrowing. Mild narrowing of the proximal right petrous segment secondary to noncalcified atheromatous plaque. Left carotid system: Patent normal caliber  common carotid artery. Carotid bifurcation atherosclerotic calcifications with approximately 80-90% narrowing of the proximal ICA. The carotid bulb is narrowed by approximately 50%. Vertebral arteries: Dominant right vertebral artery. Right V1 and V4 segment calcified atheromatous plaque with mild narrowing. Left vertebral artery atheromatous disease with no greater than mild narrowing. Skeleton: Multilevel spondylosis.  No acute osseous abnormalities. Other neck: No adenopathy.  No soft tissue mass. Upper chest: Emphysema. Dependent atelectasis. Motion artifact limits evaluation. Review of the MIP images confirms the above findings CTA HEAD FINDINGS Anterior circulation: Bilateral carotid siphon atherosclerotic calcifications. Mild narrowing of the left paraclinoid ICA. Patent, normal caliber appearance of the anterior and middle cerebral arteries. Posterior circulation: Dominant right vertebral artery. Normal caliber patent basilar, superior cerebellar and posterior cerebral arteries. Venous sinuses: As permitted by contrast timing, patent. Anatomic variants: None. 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 30% luminal narrowing of the innominate secondary to atheromatous plaque. Approximately 50% narrowing of the left carotid bulb and 80-90% narrowing of the proximal left ICA secondary to atheromatous plaque. Mild right V1 and V4 segment narrowing secondary to atheromatous disease. Mild narrowing of the proximal right petrous and left paraclinoid ICA. Electronically Signed   By: Primitivo Gauze M.D.   On: 03/08/2020 19:00   CT Head Wo Contrast  Result Date: 03/08/2020 CLINICAL DATA:  Head trauma, minor (Age >= 65y) Post fall with vomiting. EXAM: CT HEAD WITHOUT CONTRAST TECHNIQUE: Contiguous axial images were obtained from the base of the skull through the vertex without intravenous contrast. COMPARISON:  Head  CT 12/05/2019, brain MRI 12/07/2019 FINDINGS: Brain: No acute hemorrhage or evidence of acute ischemia. Stable degree of atrophy, ventricular dilatation, and periventricular chronic small vessel ischemia. Remote lacunar infarcts in the left greater than right basal ganglia. No midline shift or mass effect. No subdural or extra-axial collection. Vascular: Atherosclerosis of skullbase vasculature without hyperdense vessel or abnormal calcification. Skull: No fracture or focal lesion. Sinuses/Orbits: Minimal chronic mucosal thickening paranasal sinuses. No sinus fluid level or acute findings. The mastoid air cells are clear. Orbits are unremarkable. Other: None. IMPRESSION: 1. No acute intracranial abnormality. No skull fracture. 2. Stable atrophy, chronic small vessel ischemia, and remote basal gangliar lacunar infarcts. Electronically Signed   By: Keith Rake M.D.   On: 03/08/2020 17:00   CT  Angio Neck W and/or Wo Contrast  Result Date: 03/08/2020 CLINICAL DATA:  Neuro deficit EXAM: CT ANGIOGRAPHY HEAD AND NECK TECHNIQUE: Multidetector CT imaging of the head and neck was performed using the standard protocol during bolus administration of intravenous contrast. Multiplanar CT image reconstructions and MIPs were obtained to evaluate the vascular anatomy. Carotid stenosis measurements (when applicable) are obtained utilizing NASCET criteria, using the distal internal carotid diameter as the denominator. CONTRAST:  80mL OMNIPAQUE IOHEXOL 350 MG/ML SOLN COMPARISON:  03/08/2020 head CT and prior. FINDINGS: CTA NECK FINDINGS Aortic arch: Standard branching. Imaged portion shows no evidence of aneurysm or dissection. Extensive noncalcified atheromatous plaque involving the aortic arch and great vessel origins. Some of the plaque demonstrates a pedunculated appearance. Approximately 30% luminal narrowing of the innominate artery secondary to atheromatous disease. No significant stenosis of the left common carotid and  left subclavian artery origins. Right carotid system: Noncalcified atheromatous disease involving the proximal right common carotid artery without significant luminal narrowing. Carotid bifurcation calcified atheromatous plaque without high-grade narrowing. Mild narrowing of the proximal right petrous segment secondary to noncalcified atheromatous plaque. Left carotid system: Patent normal caliber common carotid artery. Carotid bifurcation atherosclerotic calcifications with approximately 80-90% narrowing of the proximal ICA. The carotid bulb is narrowed by approximately 50%. Vertebral arteries: Dominant right vertebral artery. Right V1 and V4 segment calcified atheromatous plaque with mild narrowing. Left vertebral artery atheromatous disease with no greater than mild narrowing. Skeleton: Multilevel spondylosis.  No acute osseous abnormalities. Other neck: No adenopathy.  No soft tissue mass. Upper chest: Emphysema. Dependent atelectasis. Motion artifact limits evaluation. Review of the MIP images confirms the above findings CTA HEAD FINDINGS Anterior circulation: Bilateral carotid siphon atherosclerotic calcifications. Mild narrowing of the left paraclinoid ICA. Patent, normal caliber appearance of the anterior and middle cerebral arteries. Posterior circulation: Dominant right vertebral artery. Normal caliber patent basilar, superior cerebellar and posterior cerebral arteries. Venous sinuses: As permitted by contrast timing, patent. Anatomic variants: None. 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 30% luminal narrowing of the innominate secondary to atheromatous plaque. Approximately 50% narrowing of the left carotid bulb and 80-90% narrowing of the proximal left ICA secondary to atheromatous plaque. Mild right V1 and V4 segment narrowing secondary to atheromatous disease. Mild narrowing of the proximal right  petrous and left paraclinoid ICA. Electronically Signed   By: Primitivo Gauze M.D.   On: 03/08/2020 19:00   CT Cervical Spine Wo Contrast  Result Date: 03/08/2020 CLINICAL DATA:  Neck trauma (Age >= 65y) Post fall with vomiting. EXAM: CT CERVICAL SPINE WITHOUT CONTRAST TECHNIQUE: Multidetector CT imaging of the cervical spine was performed without intravenous contrast. Multiplanar CT image reconstructions were also generated. COMPARISON:  None. FINDINGS: Alignment: Normal. Skull base and vertebrae: No acute fracture. Vertebral body heights are maintained. The dens and skull base are intact. Soft tissues and spinal canal: No prevertebral fluid or swelling. No visible canal hematoma. Disc levels: Disc space narrowing and endplate spurring at O7-F6. There is multilevel endplate spurring at additional levels with preservation of disc spaces. Facet hypertrophy at C4-C5 on the left and multilevel on the right. Upper chest: Breathing motion artifact. No evidence of acute abnormality. Other: Carotid calcifications. IMPRESSION: Degenerative change in the cervical spine without acute fracture or subluxation. Electronically Signed   By: Keith Rake M.D.   On: 03/08/2020 17:04   MR BRAIN WO CONTRAST  Result Date: 03/08/2020 CLINICAL DATA:  Neuro  deficit EXAM: MRI HEAD WITHOUT CONTRAST TECHNIQUE: Multiplanar, multiecho pulse sequences of the brain and surrounding structures were obtained without intravenous contrast. COMPARISON:  Concurrent CTA head and neck. 12/07/2019 MRI head and prior. FINDINGS: Brain: Moderate cerebral atrophy with ex vacuo dilatation. Scattered and confluent T2/FLAIR hyperintense foci involving the periventricular and subcortical white matter are nonspecific however commonly associated with chronic microvascular ischemic changes. Small foci of restricted diffusion involving the left frontal lobe at the vertex and left occipital lobe (2:20, 44). No intracranial hemorrhage. No midline shift,  mass lesion or extra-axial fluid collection. Sequela of remote bilateral basal ganglia, thalamic and cerebellar insults. Vascular: Please see concurrent CTA Skull and upper cervical spine: Normal marrow signal. Sinuses/Orbits: Normal orbits. Mild pansinus disease. No mastoid effusion. Other: None. IMPRESSION: Small acute infarcts involving the left frontal and occipital lobes. Moderate cerebral atrophy and chronic microvascular ischemic changes. Remote bilateral basal ganglia, thalamic and cerebellar insults. Mild pansinus disease. These results were called by telephone at the time of interpretation on 03/08/2020 at 7:13 pm to provider ADAM CURATOLO , who verbally acknowledged these results. Electronically Signed   By: Primitivo Gauze M.D.   On: 03/08/2020 19:15   MR CERVICAL SPINE WO CONTRAST  Result Date: 03/08/2020 CLINICAL DATA:  Initial evaluation for acute myelopathy. EXAM: MRI CERVICAL AND THORACIC SPINE WITHOUT CONTRAST TECHNIQUE: Multiplanar and multiecho pulse sequences of the cervical spine, to include the craniocervical junction and cervicothoracic junction, and the thoracic spine, were obtained without intravenous contrast. COMPARISON:  None available. FINDINGS: MRI CERVICAL SPINE FINDINGS Examination degraded by motion artifact. Alignment: Physiologic with preservation of the normal cervical lordosis. No listhesis. Vertebrae: Vertebral body height maintained without acute or chronic fracture. Bone marrow signal intensity within normal limits. No worrisome osseous lesions. Minimal reactive marrow edema seen about the left C6-7 facet due to facet arthritis. No other abnormal marrow edema. Cord: Normal signal and morphology. Posterior Fossa, vertebral arteries, paraspinal tissues: Visualized brain and posterior fossa within normal limits. Craniocervical junction normal. Paraspinous and prevertebral soft tissues within normal limits. Normal intravascular flow voids seen within the vertebral arteries  bilaterally. Disc levels: C2-C3: Negative interspace. Right greater than left facet hypertrophy with associated ankylosis on the right. No spinal stenosis. Foramina remain patent. C3-C4: Minimal disc bulge with uncovertebral hypertrophy. Moderate right greater than left facet and ligament flavum hypertrophy. No significant spinal stenosis. Moderate bilateral C4 foraminal narrowing. C4-C5: Mild disc bulge with uncovertebral hypertrophy. Superimposed small central/right paracentral disc protrusion mildly indents the ventral thecal sac (series 5, image 20). Moderate right with mild left facet hypertrophy. No significant spinal stenosis. Moderate left with mild to moderate right C5 foraminal stenosis. C5-C6: Degenerative intervertebral disc space narrowing with diffuse disc bulge and bilateral uncovertebral hypertrophy. Moderate left with mild right facet degeneration with associated ankylosis on the left. No significant spinal stenosis. Moderate left with mild right C6 foraminal narrowing. C6-C7: Mild annular disc bulge. Mild to moderate bilateral facet hypertrophy. No significant spinal stenosis. Foramina remain patent. C7-T1: Minimal disc bulge. Mild facet hypertrophy. No canal or foraminal stenosis. MRI THORACIC SPINE FINDINGS Examination degraded by motion artifact. Alignment: Mild scoliosis with exaggeration of the normal thoracic kyphosis. No listhesis. Vertebrae: Vertebral body height maintained without acute or chronic fracture. Bone marrow signal intensity within normal limits. Few scattered benign hemangiomata noted. No worrisome osseous lesions. No abnormal marrow edema. Cord: Normal signal and morphology. No convincing cord signal abnormality on this motion degraded exam. Conus medullaris terminates at approximately the L1 level. Paraspinal and  other soft tissues: Negative. Disc levels: No significant degenerative disc disease seen for patient age. No disc bulge or focal disc herniation. No significant  canal or foraminal stenosis. No neural impingement. IMPRESSION: 1. Normal MRI of the cervicothoracic spinal cord. No cord signal changes to suggest myelopathy or other abnormality. 2. Mild multilevel cervical spondylosis and facet arthrosis without significant spinal stenosis. Associated mild to moderate bilateral C4 through C6 foraminal narrowing as above. 3. No significant disc pathology within the thoracic spine for patient age. No thoracic spinal stenosis or neural impingement. Electronically Signed   By: Jeannine Boga M.D.   On: 03/08/2020 23:30   MR THORACIC SPINE WO CONTRAST  Result Date: 03/08/2020 CLINICAL DATA:  Initial evaluation for acute myelopathy. EXAM: MRI CERVICAL AND THORACIC SPINE WITHOUT CONTRAST TECHNIQUE: Multiplanar and multiecho pulse sequences of the cervical spine, to include the craniocervical junction and cervicothoracic junction, and the thoracic spine, were obtained without intravenous contrast. COMPARISON:  None available. FINDINGS: MRI CERVICAL SPINE FINDINGS Examination degraded by motion artifact. Alignment: Physiologic with preservation of the normal cervical lordosis. No listhesis. Vertebrae: Vertebral body height maintained without acute or chronic fracture. Bone marrow signal intensity within normal limits. No worrisome osseous lesions. Minimal reactive marrow edema seen about the left C6-7 facet due to facet arthritis. No other abnormal marrow edema. Cord: Normal signal and morphology. Posterior Fossa, vertebral arteries, paraspinal tissues: Visualized brain and posterior fossa within normal limits. Craniocervical junction normal. Paraspinous and prevertebral soft tissues within normal limits. Normal intravascular flow voids seen within the vertebral arteries bilaterally. Disc levels: C2-C3: Negative interspace. Right greater than left facet hypertrophy with associated ankylosis on the right. No spinal stenosis. Foramina remain patent. C3-C4: Minimal disc bulge with  uncovertebral hypertrophy. Moderate right greater than left facet and ligament flavum hypertrophy. No significant spinal stenosis. Moderate bilateral C4 foraminal narrowing. C4-C5: Mild disc bulge with uncovertebral hypertrophy. Superimposed small central/right paracentral disc protrusion mildly indents the ventral thecal sac (series 5, image 20). Moderate right with mild left facet hypertrophy. No significant spinal stenosis. Moderate left with mild to moderate right C5 foraminal stenosis. C5-C6: Degenerative intervertebral disc space narrowing with diffuse disc bulge and bilateral uncovertebral hypertrophy. Moderate left with mild right facet degeneration with associated ankylosis on the left. No significant spinal stenosis. Moderate left with mild right C6 foraminal narrowing. C6-C7: Mild annular disc bulge. Mild to moderate bilateral facet hypertrophy. No significant spinal stenosis. Foramina remain patent. C7-T1: Minimal disc bulge. Mild facet hypertrophy. No canal or foraminal stenosis. MRI THORACIC SPINE FINDINGS Examination degraded by motion artifact. Alignment: Mild scoliosis with exaggeration of the normal thoracic kyphosis. No listhesis. Vertebrae: Vertebral body height maintained without acute or chronic fracture. Bone marrow signal intensity within normal limits. Few scattered benign hemangiomata noted. No worrisome osseous lesions. No abnormal marrow edema. Cord: Normal signal and morphology. No convincing cord signal abnormality on this motion degraded exam. Conus medullaris terminates at approximately the L1 level. Paraspinal and other soft tissues: Negative. Disc levels: No significant degenerative disc disease seen for patient age. No disc bulge or focal disc herniation. No significant canal or foraminal stenosis. No neural impingement. IMPRESSION: 1. Normal MRI of the cervicothoracic spinal cord. No cord signal changes to suggest myelopathy or other abnormality. 2. Mild multilevel cervical  spondylosis and facet arthrosis without significant spinal stenosis. Associated mild to moderate bilateral C4 through C6 foraminal narrowing as above. 3. No significant disc pathology within the thoracic spine for patient age. No thoracic spinal stenosis or neural impingement.  Electronically Signed   By: Jeannine Boga M.D.   On: 03/08/2020 23:30   DG CHEST PORT 1 VIEW  Result Date: 03/16/2020 CLINICAL DATA:  Shortness of breath. EXAM: PORTABLE CHEST 1 VIEW COMPARISON:  March 14, 2020. FINDINGS: Stable cardiomediastinal silhouette. No pneumothorax or pleural effusion is noted. Significantly improved bilateral lung opacities are noted. Bony thorax is unremarkable. IMPRESSION: Significantly improved bilateral lung opacities compared to prior exam. Electronically Signed   By: Marijo Conception M.D.   On: 03/16/2020 08:11   DG Chest Port 1 View  Result Date: 03/14/2020 CLINICAL DATA:  Acute respiratory failure. EXAM: PORTABLE CHEST 1 VIEW COMPARISON:  03/13/2020 FINDINGS: Improvement in right upper lobe atelectasis. There remains mild airspace disease in the right upper lobe and right lower lobe. No effusion Negative for heart failure or edema.  Left lung clear. IMPRESSION: Improvement in right upper lobe atelectasis. Persistent mild airspace disease right upper lobe and right lower lobe. Electronically Signed   By: Franchot Gallo M.D.   On: 03/14/2020 08:17   DG Chest Port 1 View  Result Date: 03/13/2020 CLINICAL DATA:  Postop day 2 carotid endarterectomy. Follow-up RIGHT UPPER LOBE atelectasis. EXAM: PORTABLE CHEST 1 VIEW COMPARISON:  03/12/2020 and earlier. FINDINGS: Cardiac silhouette normal in size for AP portable technique. Improved aeration in the RIGHT UPPER LOBE since yesterday, though atelectasis persists. Stable scarring at the RIGHT lung base. No new pulmonary parenchymal abnormalities. IMPRESSION: Improved aeration in the RIGHT UPPER LOBE since yesterday, though atelectasis persists. No  new abnormalities. Electronically Signed   By: Evangeline Dakin M.D.   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 EXAM: PORTABLE CHEST 1 VIEW COMPARISON:  03/10/2020 and earlier, including CT chest 01/28/2018. FINDINGS: Cardiac silhouette mildly enlarged for the AP portable technique and the patient's suboptimal inspiration. Complete dense atelectasis of the RIGHT UPPER LOBE. Mild atelectasis at the RIGHT lung base. LEFT lung remains clear. Pulmonary vascularity normal. No visible pleural effusions. IMPRESSION: 1. Complete dense atelectasis of the RIGHT UPPER LOBE. Mild atelectasis at the RIGHT lung base. 2. Mild cardiomegaly without pulmonary edema. Electronically Signed   By: Evangeline Dakin M.D.   On: 03/12/2020 16:16   DG CHEST PORT 1 VIEW  Result Date: 03/10/2020 CLINICAL DATA:  Cough. EXAM: PORTABLE CHEST 1 VIEW COMPARISON:  Radiograph 12/05/2019 FINDINGS: Mild cardiomegaly, similar to prior. Unchanged mediastinal contours. Mild peribronchial thickening. No confluent airspace disease. No significant pleural effusion. No pneumothorax. No acute osseous abnormalities are seen. IMPRESSION: 1. Stable mild cardiomegaly. 2. Mild peribronchial thickening, can be seen with bronchitis or asthma. Electronically Signed   By: Keith Rake M.D.   On: 03/10/2020 17:31   ECHOCARDIOGRAM COMPLETE  Result Date: 03/09/2020    ECHOCARDIOGRAM REPORT   Patient Name:   [name] [name] Date of Exam: 03/09/2020 Medical Rec #:  [id number]     Height:       69.0 in Accession #:    [id number]    Weight:       185.4 lb Date of Birth:  Aug 15, 1945     BSA:          2.001 m Patient Age:    19 years      BP:           132/75 mmHg Patient Gender: M  HR:           71 bpm. Exam Location:  Inpatient Procedure: 2D Echo, Cardiac Doppler and Color Doppler Indications:    Stroke 434.91  / I163.9  History:        Patient has no prior history of Echocardiogram examinations.                 Risk Factors:Hypertension, Dyslipidemia and Diabetes. GERD. CKD.                 Abdominal Aortic Aneurysm.  Sonographer:    Jonelle Sidle Dance Referring Phys: [id number] [place]  1. Left ventricular ejection fraction, by estimation, is 55 to 60%. The left ventricle has normal function. The left ventricle has no regional wall motion abnormalities. Left ventricular diastolic parameters were normal.  2. Right ventricular systolic function is normal. The right ventricular size is normal.  3. Left atrial size was mildly dilated.  4. The mitral valve is normal in structure. Mild mitral valve regurgitation. No evidence of mitral stenosis.  5. The aortic valve is tricuspid. Aortic valve regurgitation is mild. Mild aortic valve sclerosis is present, with no evidence of aortic valve stenosis.  6. The inferior vena cava is normal in size with greater than 50% respiratory variability, suggesting right atrial pressure of 3 mmHg. FINDINGS  Left Ventricle: Left ventricular ejection fraction, by estimation, is 55 to 60%. The left ventricle has normal function. The left ventricle has no regional wall motion abnormalities. The left ventricular internal cavity size was normal in size. There is  no left ventricular hypertrophy. Left ventricular diastolic parameters were normal. Right Ventricle: The right ventricular size is normal. No increase in right ventricular wall thickness. Right ventricular systolic function is normal. Left Atrium: Left atrial size was mildly dilated. Right Atrium: Right atrial size was normal in size. Pericardium: There is no evidence of pericardial effusion. Mitral Valve: The mitral valve is normal in structure. Mild mitral valve regurgitation. No evidence of mitral valve stenosis. Tricuspid Valve: The tricuspid valve is normal in structure. Tricuspid valve regurgitation is not demonstrated. No  evidence of tricuspid stenosis. Aortic Valve: The aortic valve is tricuspid. Aortic valve regurgitation is mild. Aortic regurgitation PHT measures 390 msec. Mild aortic valve sclerosis is present, with no evidence of aortic valve stenosis. Pulmonic Valve: The pulmonic valve was normal in structure. Pulmonic valve regurgitation is not visualized. No evidence of pulmonic stenosis. Aorta: The aortic root is normal in size and structure. Venous: The inferior vena cava is normal in size with greater than 50% respiratory variability, suggesting right atrial pressure of 3 mmHg. IAS/Shunts: No atrial level shunt detected by color flow Doppler.  LEFT VENTRICLE PLAX 2D LVIDd:         5.30 cm LVIDs:         3.80 cm LV PW:         1.10 cm LV IVS:        1.10 cm LVOT diam:     2.20 cm LV SV:         90 LV SV Index:   45 LVOT Area:     3.80 cm  RIGHT VENTRICLE          IVC RV Basal diam:  1.90 cm  IVC diam: 2.00 cm TAPSE (M-mode): 1.7 cm LEFT ATRIUM             Index       RIGHT ATRIUM  Index LA diam:        4.20 cm 2.10 cm/m  RA Area:     9.05 cm LA Vol (A2C):   85.8 ml 42.89 ml/m RA Volume:   13.90 ml 6.95 ml/m LA Vol (A4C):   66.5 ml 33.24 ml/m LA Biplane Vol: 76.3 ml 38.14 ml/m  AORTIC VALVE LVOT Vmax:   111.50 cm/s LVOT Vmean:  73.400 cm/s LVOT VTI:    0.236 m AI PHT:      390 msec  AORTA Ao Root diam: 3.40 cm Ao Asc diam:  3.50 cm MITRAL VALVE MV Area (PHT): 2.95 cm    SHUNTS MV Decel Time: 257 msec    Systemic VTI:  0.24 m MV E velocity: 90.40 cm/s  Systemic Diam: 2.20 cm MV A velocity: 80.60 cm/s MV E/A ratio:  1.12 Jenkins Rouge MD Electronically signed by Jenkins Rouge MD Signature Date/Time: 03/09/2020/11:14:58 AM    Final    VAS US CAROTID  Result Date: 03/09/2020 Carotid Arterial Duplex Study Indications:       CVA and Aphasia. History of carotid stenosis. Risk Factors:      Hypertension, hyperlipidemia. Other Factors:     Abdominal Aortic Endovascular Stent Graft in 04/2013. Comparison Study:  CTA of  the neck on 03/08/2020 showed right ICA withoug                    significant stenosis. Left proximal ICA 80-90% stenosis with                    approximately 50% narrowing at the bulb.                    Carotid US on 05/31/2016 Performing Technologist: Velva Harman Sturdivant RDMS, RVT  Examination Guidelines: A complete evaluation includes B-mode imaging, spectral Doppler, color Doppler, and power Doppler as needed of all accessible portions of each vessel. Bilateral testing is considered an integral part of a complete examination. Limited examinations for reoccurring indications may be performed as noted.  Right Carotid Findings: +----------+--------+--------+--------+------------------+--------+           PSV cm/sEDV cm/sStenosisPlaque DescriptionComments +----------+--------+--------+--------+------------------+--------+ CCA Prox  81      17                                         +----------+--------+--------+--------+------------------+--------+ CCA Distal75      17                                         +----------+--------+--------+--------+------------------+--------+ ICA Prox  81      19                                         +----------+--------+--------+--------+------------------+--------+ ICA Distal81      20                                         +----------+--------+--------+--------+------------------+--------+ ECA       136     12                                         +----------+--------+--------+--------+------------------+--------+ +----------+--------+-------+----------------+-------------------+  PSV cm/sEDV cmsDescribe        Arm Pressure (mmHG) +----------+--------+-------+----------------+-------------------+ Subclavian206            Multiphasic, WNL                    +----------+--------+-------+----------------+-------------------+ +---------+--------+--+--------+--+---------+ VertebralPSV cm/s64EDV cm/s19Antegrade  +---------+--------+--+--------+--+---------+  Left Carotid Findings: +----------+--------+--------+--------+------------------+--------+           PSV cm/sEDV cm/sStenosisPlaque DescriptionComments +----------+--------+--------+--------+------------------+--------+ CCA Prox  121     21                                         +----------+--------+--------+--------+------------------+--------+ CCA Distal75      18                                         +----------+--------+--------+--------+------------------+--------+ ICA Prox  422     76      60-79%  homogeneous                +----------+--------+--------+--------+------------------+--------+ ICA Mid   91      30                                         +----------+--------+--------+--------+------------------+--------+ ICA Distal62      23                                         +----------+--------+--------+--------+------------------+--------+ ECA       243     27                                         +----------+--------+--------+--------+------------------+--------+ +----------+--------+--------+----------------+-------------------+           PSV cm/sEDV cm/sDescribe        Arm Pressure (mmHG) +----------+--------+--------+----------------+-------------------+ Subclavian170             Multiphasic, WNL                    +----------+--------+--------+----------------+-------------------+ +---------+--------+--+--------+--+---------+ VertebralPSV cm/s62EDV cm/s14Antegrade +---------+--------+--+--------+--+---------+ On 2D imaging the narrowing appears to be at the bulb/origin level with PSV suggest 80-99% stenosis. However, EDV suggest less - 60-79% stenosis.  Summary: Right Carotid: The extracranial vessels were near-normal with only minimal wall                thickening or plaque. Left Carotid: Velocities in the left ICA are consistent with a 60-79% stenosis.               PSV at the  bulb level suggest 80-99% stenosis.  *See table(s) above for measurements and observations.  Electronically signed by Monica Martinez MD on 03/09/2020 at 4:45:36 PM.    Final      Time Spent in minutes  30     Desiree Hane M.D on 03/16/2020 at 1:57 PM  To page go to [email or web address] - password [email or web address]

## 2020-03-16 NOTE — Progress Notes (Signed)
Physical Therapy Treatment Patient Details Name: [name] [name] MRN: [id number] DOB: 07/11/1945 Today's Date: 03/16/2020    History of Present Illness Pt is a 74 y/o male admitted with confusion, hypoglycemia, and a fall. 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 Pt s/p Left CEA on 9/10 with post procedure hemorrhage requiring evacuation and hypoxia. PMHx: memory loss, CKD, DM2, AAA, HTN    PT Comments    Pt very pleasant and eager to mobilize. Pt able to return to walking without RW this session with one LOB. Pt continues to have STM deficits and unable to recall room number. Pt with VSS throughout and RN aware of all mobility. Good mobility and progression for return home.  Pre gait BP 123/56 After 121/60  HR 66-72 SpO2 97-99% on RA    Follow Up Recommendations  No PT follow up;Supervision - Intermittent     Equipment Recommendations  None recommended by PT    Recommendations for Other Services       Precautions / Restrictions Precautions Precautions: Fall    Mobility  Bed Mobility Overal bed mobility: Needs Assistance Bed Mobility: Supine to Sit     Supine to sit: Supervision     General bed mobility comments: supervision for lines  Transfers Overall transfer level: Needs assistance     Sit to Stand: Supervision         General transfer comment: supervision for lines  Ambulation/Gait Ambulation/Gait assistance: Min guard Gait Distance (Feet): 800 Feet Assistive device: None Gait Pattern/deviations: Step-through pattern;Decreased stride length   Gait velocity interpretation: >2.62 ft/sec, indicative of community ambulatory General Gait Details: guarding for direction, lines and safety pt with one partial LOB with assist to recover   Stairs             Wheelchair Mobility    Modified Rankin (Stroke Patients Only)       Balance Overall balance assessment: Needs assistance Sitting-balance support:  Feet supported;No upper extremity supported Sitting balance-Leahy Scale: Good     Standing balance support: No upper extremity supported Standing balance-Leahy Scale: Good                              Cognition Arousal/Alertness: Awake/alert Behavior During Therapy: WFL for tasks assessed/performed Overall Cognitive Status: History of cognitive impairments - at baseline                       Memory: Decreased short-term memory Following Commands: Follows multi-step commands consistently       General Comments: pt with STM deficits at baseline, unable to recall room number      Exercises      General Comments        Pertinent Vitals/Pain Pain Assessment: 0-10 Pain Score: 2  Pain Location: sacrum Pain Descriptors / Indicators: Aching Pain Intervention(s): Limited activity within patient's tolerance;Repositioned    Home Living                      Prior Function            PT Goals (current goals can now be found in the care plan section) Progress towards PT goals: Progressing toward goals    Frequency    Min 3X/week      PT Plan Current plan remains appropriate    Co-evaluation  AM-PAC PT "6 Clicks" Mobility   Outcome Measure  Help needed turning from your back to your side while in a flat bed without using bedrails?: None Help needed moving from lying on your back to sitting on the side of a flat bed without using bedrails?: None Help needed moving to and from a bed to a chair (including a wheelchair)?: A Little Help needed standing up from a chair using your arms (e.g., wheelchair or bedside chair)?: A Little Help needed to walk in hospital room?: A Little Help needed climbing 3-5 steps with a railing? : A Little 6 Click Score: 20    End of Session   Activity Tolerance: Patient tolerated treatment well Patient left: in chair;with call bell/phone within reach;with nursing/sitter in room Nurse  Communication: Mobility status PT Visit Diagnosis: Unsteadiness on feet (R26.81);Other symptoms and signs involving the nervous system (R29.898)     Time: 1700-1749 PT Time Calculation (min) (ACUTE ONLY): 27 min  Charges:  $Gait Training: 8-22 mins $Therapeutic Activity: 8-22 mins                     Narada Uzzle P, PT Acute Rehabilitation Services Pager: [phone] Office: [place] 03/16/2020, 1:41 PM

## 2020-03-16 NOTE — Plan of Care (Signed)
  Problem: Education: Goal: Knowledge of secondary prevention will improve Outcome: Progressing Goal: Knowledge of patient specific risk factors addressed and post discharge goals established will improve Outcome: Progressing Goal: Individualized Educational Video(s) Outcome: Progressing   Problem: Education: Goal: Knowledge of General Education information will improve Description: Including pain rating scale, medication(s)/side effects and non-pharmacologic comfort measures Outcome: Progressing   Problem: Health Behavior/Discharge Planning: Goal: Ability to manage health-related needs will improve Outcome: Progressing   Problem: Clinical Measurements: Goal: Ability to maintain clinical measurements within normal limits will improve Outcome: Progressing Goal: Will remain free from infection Outcome: Progressing Goal: Diagnostic test results will improve Outcome: Progressing Goal: Respiratory complications will improve Outcome: Progressing Goal: Cardiovascular complication will be avoided Outcome: Progressing   Problem: Activity: Goal: Risk for activity intolerance will decrease Outcome: Progressing   Problem: Nutrition: Goal: Adequate nutrition will be maintained Outcome: Progressing   Problem: Coping: Goal: Level of anxiety will decrease Outcome: Progressing   Problem: Elimination: Goal: Will not experience complications related to bowel motility Outcome: Progressing Goal: Will not experience complications related to urinary retention Outcome: Progressing   Problem: Pain Managment: Goal: General experience of comfort will improve Outcome: Progressing   Problem: Safety: Goal: Ability to remain free from injury will improve Outcome: Progressing   Problem: Skin Integrity: Goal: Risk for impaired skin integrity will decrease Outcome: Progressing

## 2020-03-16 NOTE — Progress Notes (Addendum)
NAME:  [name] [name], MRN:  [id number], DOB:  11/29/1945, LOS: 7 ADMISSION DATE:  03/08/2020, CONSULTATION DATE:  03/16/2020  REFERRING MD:  Wyline Copas, triad, CHIEF COMPLAINT:  resp distress   Brief History   74 year old man admitted 9/7 with  left frontal and occipital CVA , underwent left CEA 9/10 , required left neck washout for hematoma.  Transferred to ICU for blood pressure management developed hypoxia postop with right upper lobe atelectasis, hence PCCM consulted   Past Medical History  Diabetes type 2 Hypertension Hyperlipidemia  Significant Hospital Events   9/10 left CEA 9/11 transferred to ICU post evacuation of left hematoma for blood pressure management 9/12 improved aeration right upper lobe  Consults:  Vascular  Procedures:    Significant Diagnostic Tests:  MRI brain 9/7 small acute infarcts left frontal and occipital lobes, moderate cerebral atrophy, sequelae of remote bilateral basal ganglia thalamic and cerebellar insults MRI cervical and thoracic spine 9/7>> mild cervical spondylosis  CT angiogram neck 9/7 >> 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 30% luminal narrowing of the innominate secondary to atheromatous plaque.  Approximately 50% narrowing of the left carotid bulb and 80-90% narrowing of the proximal left ICA secondary to atheromatous plaque.  Micro Data:    Antimicrobials:     Interim history/subjective:  9/15:Down to 10 cleviprex today. Adding losartan and taking off metoprolol in favor of coreg for better BP control without dropping HR further (already in 60's) will closely monitor.   Objective   Blood pressure 128/60, pulse 71, temperature 98.4 F (36.9 C), resp. rate 14, height 5\' 9"  (1.753 m), weight 88.7 kg, SpO2 100 %.    FiO2 (%):  [40 %] 40 %   Intake/Output Summary (Last 24 hours) at 03/16/2020 1200 Last data filed at 03/16/2020 1100 Gross per 24 hour  Intake 2174.13 ml  Output 2919 ml  Net  -744.87 ml   Filed Weights   03/09/20 0201 03/14/20 0600  Weight: 84.1 kg 88.7 kg    Examination: General: Elderly man, no distress, on 1-2 L nasal cannula HENT: Mild pallor, no icterus, dressing with small ooze, drain removed.  Lungs: Bilateral chest excursion, Clear throughout, diminished per bases Cardiovascular: S1-S2 , RRR, No RMG Abdomen: Soft nontender abdomen, BS +, ND Extremities: No rash, no edema Neuro: Awake, alert, interactive, MAE x 4, Follows commands , nonfocal Strong cough, Using IS  Labs show stable anemia, mild hypokalemia,  Continuing improved renal function  Resolved Hospital Problem list     Assessment & Plan:  Acute respiratory failure related to right upper lobe atelectasis likely from aspirated blood due to airway bleeding,?  Traumatic intubation for emergent surgery Work of breathing is acceptable, oxygen requirements decreasing  - Aggressive pulmonary Toilet -Continue incentive spirometry , continue to wean  FiO2 -Hypertonic saline nebs -Mobilize in a chair or upright - trend CXR  Hypertensive urgency - New Cleviprex SBP goal is 761 systolic - d/c metoprolol in favor of coreg - continue amlodipine 10 mg - adding losartan  -titrate cleviprex sbp <140 per vascular  Acute CVA Hematoma post left CEA -aspirin and Plavix can be resumed per vascular -Mobilize per PT    Best practice:  Diet: Diet per Primary Pain/Anxiety/Delirium protocol (if indicated): N/A VAP protocol (if indicated): N/A DVT prophylaxis: SCDs GI prophylaxis: Protonix Glucose control: SSI Mobility: Bedrest Code Status: Full Family Communication: per primary Disposition: ICU  Labs   CBC: Recent Labs  Lab 03/10/20 0423 03/10/20 0423 03/11/20 0139 03/12/20  0408 03/12/20 1130 03/13/20 0500 03/14/20 0318 03/15/20 0445 03/16/20 0323  WBC 8.5   < > 7.1   < > 12.7* 9.5 10.2 9.7 7.5  NEUTROABS 5.8  --  3.9  --   --   --   --   --   --   HGB 12.4*   < > 12.4*   < >  10.0* 8.9* 9.0* 9.3* 9.5*  HCT 37.6*   < > 36.5*   < > 30.7* 27.7* 27.9* 28.4* 29.7*  MCV 91.9   < > 91.0   < > 94.5 95.2 96.5 93.4 92.5  PLT 148*   < > 163   < > 201 191 195 208 272   < > = values in this interval not displayed.    Basic Metabolic Panel: Recent Labs  Lab 03/10/20 0423 03/10/20 0423 03/11/20 0139 03/11/20 0139 03/12/20 0408 03/12/20 0408 03/12/20 6283 03/13/20 0500 03/14/20 0318 03/14/20 0328 03/15/20 0445 03/16/20 0323  NA 140   < > 140   < > 140   < > 143 140 142  --  140 142  K 3.4*   < > 3.8   < > 4.1   < > 3.7 3.7 3.5  --  3.2* 3.9  CL 107   < > 105   < > 109  --   --  109 110  --  108 108  CO2 24   < > 24   < > 21*  --   --  24 25  --  25 26  GLUCOSE 106*   < > 90   < > 145*  --   --  142* 119*  --  126* 116*  BUN 12   < > 13   < > 12  --   --  8 8  --  5* 8  CREATININE 1.20   < > 1.19   < > 1.24  --   --  1.13 1.02  --  0.96 0.94  CALCIUM 9.3   < > 9.6   < > 8.8*  --   --  8.4* 8.5*  --  8.8* 9.1  MG 2.0  --  2.0  --   --   --   --   --   --  1.9  --  2.0  PHOS 2.7  --  3.4  --   --   --   --   --   --   --   --   --    < > = values in this interval not displayed.   GFR: Estimated Creatinine Clearance: 76 mL/min (by C-G formula based on SCr of 0.94 mg/dL). Recent Labs  Lab 03/13/20 0500 03/14/20 0318 03/15/20 0445 03/16/20 0323  WBC 9.5 10.2 9.7 7.5   Critical care time: The patient is critically ill with multiple organ systems failure and requires high complexity decision making for assessment and support, frequent evaluation and titration of therapies, application of advanced monitoring technologies and extensive interpretation of multiple databases.  Critical care time 32 mins. This represents my time independent of the NPs time taking care of the pt. This is excluding procedures.    [facility] 03/16/2020, 12:10 PM

## 2020-03-17 LAB — CBC WITH DIFFERENTIAL/PLATELET
Abs Immature Granulocytes: 0.09 10*3/uL — ABNORMAL HIGH (ref 0.00–0.07)
Basophils Absolute: 0.1 10*3/uL (ref 0.0–0.1)
Basophils Relative: 1 %
Eosinophils Absolute: 0.6 10*3/uL — ABNORMAL HIGH (ref 0.0–0.5)
Eosinophils Relative: 10 %
HCT: 28.6 % — ABNORMAL LOW (ref 39.0–52.0)
Hemoglobin: 9.3 g/dL — ABNORMAL LOW (ref 13.0–17.0)
Immature Granulocytes: 2 %
Lymphocytes Relative: 32 %
Lymphs Abs: 1.9 10*3/uL (ref 0.7–4.0)
MCH: 30 pg (ref 26.0–34.0)
MCHC: 32.5 g/dL (ref 30.0–36.0)
MCV: 92.3 fL (ref 80.0–100.0)
Monocytes Absolute: 0.6 10*3/uL (ref 0.1–1.0)
Monocytes Relative: 10 %
Neutro Abs: 2.7 10*3/uL (ref 1.7–7.7)
Neutrophils Relative %: 45 %
Platelets: 256 10*3/uL (ref 150–400)
RBC: 3.1 MIL/uL — ABNORMAL LOW (ref 4.22–5.81)
RDW: 13 % (ref 11.5–15.5)
WBC: 5.9 10*3/uL (ref 4.0–10.5)
nRBC: 0 % (ref 0.0–0.2)

## 2020-03-17 LAB — GLUCOSE, CAPILLARY
Glucose-Capillary: 114 mg/dL — ABNORMAL HIGH (ref 70–99)
Glucose-Capillary: 136 mg/dL — ABNORMAL HIGH (ref 70–99)
Glucose-Capillary: 120 mg/dL — ABNORMAL HIGH (ref 70–99)

## 2020-03-17 MED ORDER — CARVEDILOL 25 MG PO TABS
25.0000 mg | ORAL_TABLET | Freq: Two times a day (BID) | ORAL | Status: DC
  Administered 2020-03-17 – 2020-03-23 (×11): 25 mg via ORAL
  Filled 2020-03-17 (×12): qty 1

## 2020-03-17 MED ORDER — NICARDIPINE HCL IN NACL 20-0.86 MG/200ML-% IV SOLN
3.0000 mg/h | INTRAVENOUS | Status: DC
  Administered 2020-03-17: 5 mg/h via INTRAVENOUS
  Administered 2020-03-17: 12.5 mg/h via INTRAVENOUS
  Filled 2020-03-17 (×4): qty 200

## 2020-03-17 MED ORDER — NICARDIPINE HCL IN NACL 40-0.83 MG/200ML-% IV SOLN
3.0000 mg/h | INTRAVENOUS | Status: DC
  Administered 2020-03-17: 3 mg/h via INTRAVENOUS
  Filled 2020-03-17 (×2): qty 200

## 2020-03-17 MED ORDER — HYDROCHLOROTHIAZIDE 50 MG PO TABS
50.0000 mg | ORAL_TABLET | Freq: Every day | ORAL | Status: DC
  Administered 2020-03-17 – 2020-03-21 (×5): 50 mg via ORAL
  Filled 2020-03-17: qty 1
  Filled 2020-03-17: qty 2
  Filled 2020-03-17 (×2): qty 1
  Filled 2020-03-17: qty 2
  Filled 2020-03-17 (×3): qty 1
  Filled 2020-03-17: qty 2
  Filled 2020-03-17: qty 1
  Filled 2020-03-17 (×3): qty 2

## 2020-03-17 MED ORDER — LOSARTAN POTASSIUM 50 MG PO TABS
100.0000 mg | ORAL_TABLET | Freq: Every day | ORAL | Status: DC
  Administered 2020-03-17 – 2020-03-21 (×5): 100 mg via ORAL
  Filled 2020-03-17 (×7): qty 2

## 2020-03-17 MED ORDER — LABETALOL HCL 5 MG/ML IV SOLN: 10.0000 mg | INTRAVENOUS | Status: DC | PRN

## 2020-03-17 NOTE — Progress Notes (Signed)
Occupational Therapy Treatment Patient Details Name: [name] [name] MRN: [id number] DOB: 08-29-1945 Today's Date: 03/17/2020    History of present illness Pt is a 74 y/o male admitted with confusion, hypoglycemia, and a fall. 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 Pt s/p Left CEA on 9/10 with post procedure hemorrhage requiring evacuation and hypoxia. PMHx: memory loss, CKD, DM2, AAA, HTN   OT comments  Pt awakened for therapy. Assisted OOB to chair with min guard assist. Pt performed UE dressing with min assist and oral care with set up. Pt reports blurriness in R eye. Friend visiting at end of session.   Follow Up Recommendations  Outpatient OT;Supervision/Assistance - 24 hour    Equipment Recommendations  3 in 1 bedside commode    Recommendations for Other Services      Precautions / Restrictions Precautions Precautions: Fall       Mobility Bed Mobility Overal bed mobility: Needs Assistance Bed Mobility: Supine to Sit     Supine to sit: Supervision     General bed mobility comments: supervision for lines  Transfers Overall transfer level: Needs assistance Equipment used: None Transfers: Sit to/from Stand;Stand Pivot Transfers Sit to Stand: Min guard Stand pivot transfers: Min guard       General transfer comment: min guard for safety, did not require physical assist    Balance Overall balance assessment: Needs assistance   Sitting balance-Leahy Scale: Good       Standing balance-Leahy Scale: Fair                             ADL either performed or assessed with clinical judgement   ADL Overall ADL's : Needs assistance/impaired     Grooming: Oral care;Sitting;Set up           Upper Body Dressing : Minimal assistance;Sitting       Toilet Transfer: Min guard;Ambulation   Toileting- Clothing Manipulation and Hygiene: Min guard;Sit to/from stand               Vision   Additional  Comments: pt complaining of blurriness in R eye   Perception     Praxis      Cognition Arousal/Alertness: Awake/alert Behavior During Therapy: Flat affect Overall Cognitive Status: History of cognitive impairments - at baseline Area of Impairment: Memory;Problem solving                     Memory: Decreased short-term memory       Problem Solving: Slow processing          Exercises     Shoulder Instructions       General Comments      Pertinent Vitals/ Pain       Pain Assessment: No/denies pain  Home Living                                          Prior Functioning/Environment              Frequency  Min 2X/week        Progress Toward Goals  OT Goals(current goals can now be found in the care plan section)  Progress towards OT goals: Progressing toward goals  Acute Rehab OT Goals Patient Stated Goal: home ASAP OT Goal Formulation: With patient Time For Goal Achievement: 03/23/20 Potential  to Achieve Goals: Good  Plan Discharge plan remains appropriate    Co-evaluation                 AM-PAC OT "6 Clicks" Daily Activity     Outcome Measure   Help from another person eating meals?: None Help from another person taking care of personal grooming?: A Little Help from another person toileting, which includes using toliet, bedpan, or urinal?: A Little Help from another person bathing (including washing, rinsing, drying)?: A Little Help from another person to put on and taking off regular upper body clothing?: A Little Help from another person to put on and taking off regular lower body clothing?: A Little 6 Click Score: 19    End of Session    OT Visit Diagnosis: Other abnormalities of gait and mobility (R26.89);History of falling (Z91.81);Other symptoms and signs involving cognitive function   Activity Tolerance Patient tolerated treatment well   Patient Left in chair;with call bell/phone within reach;with  chair alarm set;with family/visitor present   Nurse Communication Mobility status        Time: 1100-1117 OT Time Calculation (min): 17 min  Charges: OT General Charges $OT Visit: 1 Visit OT Treatments $Self Care/Home Management : 8-22 mins  Nestor Lewandowsky, OTR/L Acute Rehabilitation Services Pager: [phone] Office: [phone]   Malka So 03/17/2020, 1:36 PM

## 2020-03-17 NOTE — Progress Notes (Signed)
Physical Therapy Treatment Patient Details Name: [name] [name] MRN: [id number] DOB: February 22, 1946 Today's Date: 03/17/2020    History of Present Illness Pt is a 74 y/o male admitted with confusion, hypoglycemia, and a fall. 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 Pt s/p Left CEA on 9/10 with post procedure hemorrhage requiring evacuation and hypoxia. PMHx: memory loss, CKD, DM2, AAA, HTN    PT Comments    Pt making good progress.  Able to ambulate without LOB today and added balance challenges.  All VSS.  Required min cues for safety, transfer techniques to reduce strain on neck , and for posture. Cont POC.     Follow Up Recommendations  No PT follow up;Supervision - Intermittent     Equipment Recommendations  None recommended by PT    Recommendations for Other Services       Precautions / Restrictions Precautions Precautions: Fall    Mobility  Bed Mobility Overal bed mobility: Needs Assistance Bed Mobility: Sit to Sidelying;Rolling Rolling: Min guard   Supine to sit: Supervision   Sit to sidelying: Min assist General bed mobility comments: Min A for legs back to bed and min guard for head to decrease strain on neck muscles  Transfers Overall transfer level: Needs assistance Equipment used: None Transfers: Sit to/from Stand Sit to Stand: Min guard Stand pivot transfers: Min guard       General transfer comment: min guard for safety, did not require physical assist; performed x 3  Ambulation/Gait Ambulation/Gait assistance: Min guard Gait Distance (Feet): 800 Feet   Gait Pattern/deviations: Step-through pattern;Decreased stride length Gait velocity: Decreased   General Gait Details: Repeated cues for posture and increased stride length; also added balance challenges (see below)   Stairs             Wheelchair Mobility    Modified Rankin (Stroke Patients Only)       Balance Overall balance assessment:  Needs assistance Sitting-balance support: Feet supported;No upper extremity supported Sitting balance-Leahy Scale: Good     Standing balance support: No upper extremity supported Standing balance-Leahy Scale: Good                 High Level Balance Comments: With gait performed small head rotations and nods, changing speeds, cues to increase stride length, and stepping over objects (tiles in hall)            Cognition Arousal/Alertness: Awake/alert Behavior During Therapy: WFL for tasks assessed/performed Overall Cognitive Status: History of cognitive impairments - at baseline Area of Impairment: Memory;Problem solving                     Memory: Decreased short-term memory       Problem Solving: Slow processing        Exercises      General Comments        Pertinent Vitals/Pain Pain Assessment: 0-10 Pain Score: 5  Pain Location: back Pain Descriptors / Indicators: Aching (reports chronic) Pain Intervention(s): Limited activity within patient's tolerance;Monitored during session;Repositioned;Relaxation    Home Living                      Prior Function            PT Goals (current goals can now be found in the care plan section) Acute Rehab PT Goals Patient Stated Goal: home ASAP PT Goal Formulation: With patient Time For Goal Achievement: 03/21/20 Potential to Achieve Goals: Good  Progress towards PT goals: Progressing toward goals    Frequency    Min 3X/week      PT Plan Current plan remains appropriate    Co-evaluation              AM-PAC PT "6 Clicks" Mobility   Outcome Measure  Help needed turning from your back to your side while in a flat bed without using bedrails?: None Help needed moving from lying on your back to sitting on the side of a flat bed without using bedrails?: None Help needed moving to and from a bed to a chair (including a wheelchair)?: None Help needed standing up from a chair using your  arms (e.g., wheelchair or bedside chair)?: None Help needed to walk in hospital room?: A Little Help needed climbing 3-5 steps with a railing? : A Little 6 Click Score: 22    End of Session Equipment Utilized During Treatment: Gait belt Activity Tolerance: Patient tolerated treatment well Patient left: with call bell/phone within reach;in bed Nurse Communication: Mobility status PT Visit Diagnosis: Unsteadiness on feet (R26.81);Other symptoms and signs involving the nervous system (R29.898)     Time: 6503-5465 PT Time Calculation (min) (ACUTE ONLY): 25 min  Charges:  $Gait Training: 23-37 mins                     Abran Richard, PT Acute Rehab Services Pager [phone] [facility] 03/17/2020, 4:20 PM

## 2020-03-17 NOTE — Progress Notes (Signed)
[facility]  Subjective  -no acute events overnight.  Remains on 11 of Cleviprex drip this morning.  Blood pressure medicines have been aggressively adjusted for p.o. titration.   Objective 126/68 69 97.9 F (36.6 C) (Oral) (!) 9 92%  Intake/Output Summary (Last 24 hours) at 03/17/2020 0924 Last data filed at 03/17/2020 0800 Gross per 24 hour  Intake 1017.86 ml  Output 1955 ml  Net -937.14 ml    Cranial nerves II through XII grossly intact and no deficits on exam otherwise. Left neck drain removed yesterday and that is clean and dry. Has a stable left neck hematoma that is very soft.  Laboratory Lab Results: Recent Labs    03/16/20 0323 03/17/20 0352  WBC 7.5 5.9  HGB 9.5* 9.3*  HCT 29.7* 28.6*  PLT 272 256   BMET Recent Labs    03/15/20 0445 03/16/20 0323  NA 140 142  K 3.2* 3.9  CL 108 108  CO2 25 26  GLUCOSE 126* 116*  BUN 5* 8  CREATININE 0.96 0.94  CALCIUM 8.8* 9.1    COAG Lab Results  Component Value Date   INR 0.9 03/11/2020   INR 0.9 03/08/2020   INR 1.02 04/29/2013   No results found for: PTT  Assessment/Planning:  POD#6 s/p L CEA for symptomatic high grade stenosis.  Post-op hypertensive emergency, ultimately required washout neck hematoma in OR and ongoing issues with BP post washout.  Left neck drain removed yesterday.  Neuro intact.  Left neck hematoma appears soft and stable.  Trying to wean cleviprex and appreciate critical care.  Now on Coreg BID, increased amlodipine, and now losartan.  Still on 11 cleviprex today and appreciate CCU and Medicine assistant to get BP controlled.    Discussed would not feel comfortable with full anticoagulation for his A. fib given neck hematoma.  Would maybe consider restarting aspirin tomorrow.  Hold Plavix which neurology agrees with.  Marty Heck 03/17/2020 9:24 AM --

## 2020-03-17 NOTE — Progress Notes (Signed)
NAME:  [name] [name], MRN:  [id number], DOB:  01-18-1946, LOS: 8 ADMISSION DATE:  03/08/2020, CONSULTATION DATE:  03/17/2020  REFERRING MD:  Wyline Copas, triad, CHIEF COMPLAINT:  resp distress   Brief History   74 year old man admitted 9/7 with  left frontal and occipital CVA , underwent left CEA 9/10 , required left neck washout for hematoma.  Transferred to ICU for blood pressure management developed hypoxia postop with right upper lobe atelectasis, hence PCCM consulted   Past Medical History  Diabetes type 2 Hypertension Hyperlipidemia  Significant Hospital Events   9/10 left CEA 9/11 transferred to ICU post evacuation of left hematoma for blood pressure management 9/12 improved aeration right upper lobe  Consults:  Vascular  Procedures:    Significant Diagnostic Tests:  MRI brain 9/7 small acute infarcts left frontal and occipital lobes, moderate cerebral atrophy, sequelae of remote bilateral basal ganglia thalamic and cerebellar insults MRI cervical and thoracic spine 9/7>> mild cervical spondylosis  CT angiogram neck 9/7 >> 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 30% luminal narrowing of the innominate secondary to atheromatous plaque.  Approximately 50% narrowing of the left carotid bulb and 80-90% narrowing of the proximal left ICA secondary to atheromatous plaque.  Micro Data:    Antimicrobials:     Interim history/subjective:  9/16: no acute changes overnight. Has had some oozing but that is improved this am. Cont to titrate cleviprex for sbp <140. Increasing losartan today. Cont coreg and amlodipine 9/15:Down to 10 cleviprex today. Adding losartan and taking off metoprolol in favor of coreg for better BP control without dropping HR further (already in 60's) will closely monitor.   Objective   Blood pressure 120/62, pulse 64, temperature 97.9 F (36.6 C), temperature source Oral, resp. rate 12, height 5\' 9"  (1.753 m), weight 88.7  kg, SpO2 98 %.    FiO2 (%):  [35 %] 35 %   Intake/Output Summary (Last 24 hours) at 03/17/2020 0802 Last data filed at 03/17/2020 0600 Gross per 24 hour  Intake 952.64 ml  Output 2460 ml  Net -1507.36 ml   Filed Weights   03/09/20 0201 03/14/20 0600  Weight: 84.1 kg 88.7 kg    Examination: General: Elderly man, no distress, on 1-2 L nasal cannula HENT: Mild pallor, no icterus, dressing with small ooze, drain removed.  Lungs: Bilateral chest excursion, Clear throughout, diminished per bases Cardiovascular: S1-S2 , RRR, No RMG Abdomen: Soft nontender abdomen, BS +, ND Extremities: No rash, no edema Neuro: Awake, alert, interactive, MAE x 4, Follows commands , nonfocal Strong cough, Using IS  Labs show stable anemia, mild hypokalemia,  Continuing improved renal function  Resolved Hospital Problem list     Assessment & Plan:  Acute respiratory failure related to right upper lobe atelectasis likely from aspirated blood due to airway bleeding,?  Traumatic intubation for emergent surgery -resolved on RA -pulmonary Toilet -Continue incentive spirometry -Mobilize in a chair or upright  Hypertensive urgency - Cleviprex SBP goal is 127 systolic - cont coreg, perhaps need to increase after monitoring with increased losartan - continue amlodipine 10 mg -increase losartan  Acute CVA Hematoma post left CEA -aspirin and Plavix can be resumed per vascular -Mobilize per PT    Best practice:  Diet: cardiac Pain/Anxiety/Delirium protocol (if indicated): N/A VAP protocol (if indicated): N/A DVT prophylaxis: SCDs GI prophylaxis: Protonix Glucose control: SSI Mobility: Bedrest Code Status: Full Family Communication: updated pt.  Disposition: ICU  Labs   CBC: Recent Labs  Lab  03/11/20 0139 03/12/20 0408 03/13/20 0500 03/14/20 0318 03/15/20 0445 03/16/20 0323 03/17/20 0352  WBC 7.1   < > 9.5 10.2 9.7 7.5 5.9  NEUTROABS 3.9  --   --   --   --   --  2.7  HGB 12.4*   < >  8.9* 9.0* 9.3* 9.5* 9.3*  HCT 36.5*   < > 27.7* 27.9* 28.4* 29.7* 28.6*  MCV 91.0   < > 95.2 96.5 93.4 92.5 92.3  PLT 163   < > 191 195 208 272 256   < > = values in this interval not displayed.    Basic Metabolic Panel: Recent Labs  Lab 03/11/20 0139 03/11/20 0139 03/12/20 0408 03/12/20 0408 03/12/20 7564 03/13/20 0500 03/14/20 0318 03/14/20 0328 03/15/20 0445 03/16/20 0323  NA 140   < > 140   < > 143 140 142  --  140 142  K 3.8   < > 4.1   < > 3.7 3.7 3.5  --  3.2* 3.9  CL 105   < > 109  --   --  109 110  --  108 108  CO2 24   < > 21*  --   --  24 25  --  25 26  GLUCOSE 90   < > 145*  --   --  142* 119*  --  126* 116*  BUN 13   < > 12  --   --  8 8  --  5* 8  CREATININE 1.19   < > 1.24  --   --  1.13 1.02  --  0.96 0.94  CALCIUM 9.6   < > 8.8*  --   --  8.4* 8.5*  --  8.8* 9.1  MG 2.0  --   --   --   --   --   --  1.9  --  2.0  PHOS 3.4  --   --   --   --   --   --   --   --   --    < > = values in this interval not displayed.   GFR: Estimated Creatinine Clearance: 76 mL/min (by C-G formula based on SCr of 0.94 mg/dL). Recent Labs  Lab 03/14/20 0318 03/15/20 0445 03/16/20 0323 03/17/20 0352  WBC 10.2 9.7 7.5 5.9   Critical care time: The patient is critically ill with multiple organ systems failure and requires high complexity decision making for assessment and support, frequent evaluation and titration of therapies, application of advanced monitoring technologies and extensive interpretation of multiple databases.  Critical care time 36 mins. This represents my time independent of the NPs time taking care of the pt. This is excluding procedures.    [facility] 03/17/2020, 8:02 AM

## 2020-03-18 LAB — BASIC METABOLIC PANEL
Anion gap: 3 — ABNORMAL LOW (ref 5–15)
BUN: 12 mg/dL (ref 8–23)
CO2: 27 mmol/L (ref 22–32)
Calcium: 8.9 mg/dL (ref 8.9–10.3)
Chloride: 106 mmol/L (ref 98–111)
Creatinine, Ser: 1.13 mg/dL (ref 0.61–1.24)
GFR calc Af Amer: >60 mL/min (ref >60)
GFR calc non Af Amer: >60 mL/min (ref >60)
Glucose, Bld: 111 mg/dL — ABNORMAL HIGH (ref 70–99)
Potassium: 3.3 mmol/L — ABNORMAL LOW (ref 3.5–5.1)
Sodium: 136 mmol/L (ref 135–145)

## 2020-03-18 LAB — CBC WITH DIFFERENTIAL/PLATELET
Abs Immature Granulocytes: 0.1 10*3/uL — ABNORMAL HIGH (ref 0.00–0.07)
Basophils Absolute: 0.1 10*3/uL (ref 0.0–0.1)
Basophils Relative: 1 %
Eosinophils Absolute: 0.5 10*3/uL (ref 0.0–0.5)
Eosinophils Relative: 8 %
HCT: 28.2 % — ABNORMAL LOW (ref 39.0–52.0)
Hemoglobin: 8.9 g/dL — ABNORMAL LOW (ref 13.0–17.0)
Immature Granulocytes: 2 %
Lymphocytes Relative: 32 %
Lymphs Abs: 1.9 10*3/uL (ref 0.7–4.0)
MCH: 29.1 pg (ref 26.0–34.0)
MCHC: 31.6 g/dL (ref 30.0–36.0)
MCV: 92.2 fL (ref 80.0–100.0)
Monocytes Absolute: 0.6 10*3/uL (ref 0.1–1.0)
Monocytes Relative: 11 %
Neutro Abs: 2.8 10*3/uL (ref 1.7–7.7)
Neutrophils Relative %: 46 %
Platelets: 256 10*3/uL (ref 150–400)
RBC: 3.06 MIL/uL — ABNORMAL LOW (ref 4.22–5.81)
RDW: 12.9 % (ref 11.5–15.5)
WBC: 5.9 10*3/uL (ref 4.0–10.5)
nRBC: 0 % (ref 0.0–0.2)

## 2020-03-18 LAB — GLUCOSE, CAPILLARY
Glucose-Capillary: 129 mg/dL — ABNORMAL HIGH (ref 70–99)
Glucose-Capillary: 119 mg/dL — ABNORMAL HIGH (ref 70–99)
Glucose-Capillary: 113 mg/dL — ABNORMAL HIGH (ref 70–99)
Glucose-Capillary: 140 mg/dL — ABNORMAL HIGH (ref 70–99)
Glucose-Capillary: 119 mg/dL — ABNORMAL HIGH (ref 70–99)

## 2020-03-18 MED ORDER — POTASSIUM CHLORIDE CRYS ER 20 MEQ PO TBCR
40.0000 meq | EXTENDED_RELEASE_TABLET | Freq: Two times a day (BID) | ORAL | Status: AC
  Administered 2020-03-18 (×2): 40 meq via ORAL
  Filled 2020-03-18 (×2): qty 2

## 2020-03-18 NOTE — Progress Notes (Addendum)
Progress Note    03/18/2020 7:28 AM 6 Days Post-Op  Subjective: He is out of bed to chair eating breakfast without complaints. Night shift attendant RN at bedside.  She was able to titrate his nifedipine off this morning at approximately 6 AM.   Vitals:   03/18/20 0600 03/18/20 0650  BP: (!) 113/53   Pulse: 63   Resp: 13   Temp:  97.8 F (36.6 C)  SpO2: 99%     Physical Exam: Cardiac: Rate and rhythm are regular Lungs: Nonlabored Incisions: Well approximated.  Left neck hematoma stable Extremities: 5 out of 5 upper extremity grip strength.  Lower extremity motor intact. Neurologic: Alert and oriented x4.  Face symmetrical.  CBC    Component Value Date/Time   WBC 5.9 03/18/2020 0521   RBC 3.06 (L) 03/18/2020 0521   HGB 8.9 (L) 03/18/2020 0521   HCT 28.2 (L) 03/18/2020 0521   PLT 256 03/18/2020 0521   MCV 92.2 03/18/2020 0521   MCH 29.1 03/18/2020 0521   MCHC 31.6 03/18/2020 0521   RDW 12.9 03/18/2020 0521   LYMPHSABS 1.9 03/18/2020 0521   MONOABS 0.6 03/18/2020 0521   EOSABS 0.5 03/18/2020 0521   BASOSABS 0.1 03/18/2020 0521    BMET    Component Value Date/Time   NA 136 03/18/2020 0521   K 3.3 (L) 03/18/2020 0521   CL 106 03/18/2020 0521   CO2 27 03/18/2020 0521   GLUCOSE 111 (H) 03/18/2020 0521   BUN 12 03/18/2020 0521   CREATININE 1.13 03/18/2020 0521   CALCIUM 8.9 03/18/2020 0521   GFRNONAA >60 03/18/2020 0521   GFRAA >60 03/18/2020 0521     Intake/Output Summary (Last 24 hours) at 03/18/2020 0728 Last data filed at 03/18/2020 2505 Gross per 24 hour  Intake 1759.8 ml  Output 2000 ml  Net -240.2 ml    HOSPITAL MEDICATIONS Scheduled Meds: . amLODipine  10 mg Oral Daily  . carvedilol  25 mg Oral BID WC  . Chlorhexidine Gluconate Cloth  6 each Topical Daily  . citalopram  20 mg Oral Daily  . donepezil  10 mg Oral QHS  . folic acid  1 mg Oral Daily  . hydrochlorothiazide  50 mg Oral Daily  . insulin aspart  0-5 Units Subcutaneous QHS  .  insulin aspart  0-9 Units Subcutaneous TID WC  . losartan  100 mg Oral Daily  . memantine  10 mg Oral BID  . pantoprazole  40 mg Oral Daily  . rosuvastatin  40 mg Oral Q2000  . vitamin B-12  1,000 mcg Oral Daily   Continuous Infusions: . sodium chloride Stopped (03/16/20 1701)  . sodium chloride    .  ceFAZolin (ANCEF) IV 1 g (03/18/20 3976)  . clevidipine Stopped (03/17/20 1818)  . magnesium sulfate bolus IVPB    . niCARDipine 5 mg/hr (03/18/20 0600)   PRN Meds:.sodium chloride, sodium chloride, acetaminophen **OR** acetaminophen (TYLENOL) oral liquid 160 mg/5 mL **OR** acetaminophen, albuterol, alum & mag hydroxide-simeth, bisacodyl, labetalol, magnesium sulfate bolus IVPB, morphine injection **OR** oxyCODONE-acetaminophen, ondansetron, phenol, promethazine  Assessment: 74 year old male postoperative day 7 left carotid endarterectomy with return to the OR for left neck hematoma washout.  He developed acute postoperative hypertension and is required intravenous antihypertensive medication as well as oral meds for control.  Developed postoperative atrial fibrillation, rate controlled.  Vital signs are stable this morning and he is afebrile.  Acute blood loss anemia with relatively stable hemoglobin.    Plan: -Continue to monitor off  IV antihypertensives.  If remains stable may be able to move to monitored floor.  Restart aspirin therapy pending Dr. Ainsley Spinner assessment today. -DVT prophylaxis: SCDs   Risa Grill, PA-C [facility] [phone] 03/18/2020  7:28 AM   I have seen and evaluated the patient. I agree with the PA note as documented above. Weaned off cleviprex and all BP drips this am.  Appreciate critical care aggressive management of his BP.  Should be ok for floor today and out of ICU.  Moderate neck hematoma has been stable and soft since re-bleed over weekend when BP was 533+ systolic.  Maybe aspirin tomorrow if BP remains well controlled today.  Neuro  intact.    Marty Heck, MD [facility] [place] [address] Office: [phone]

## 2020-03-18 NOTE — Progress Notes (Addendum)
NAME:  [name] [name], MRN:  [id number], DOB:  11-11-1945, LOS: 9 ADMISSION DATE:  03/08/2020, CONSULTATION DATE:  03/18/2020  REFERRING MD:  Wyline Copas, [facility], CHIEF COMPLAINT:  resp distress   Brief History   74 year old man admitted 9/7 with  left frontal and occipital CVA , underwent left CEA 9/10 , required left neck washout for hematoma.  Transferred to ICU for blood pressure management developed hypoxia postop with right upper lobe atelectasis, hence PCCM consulted   Past Medical History  Diabetes type 2 Hypertension Hyperlipidemia  Significant Hospital Events   9/10 left CEA 9/11 transferred to ICU post evacuation of left hematoma for blood pressure management 9/12 improved aeration right upper lobe  Consults:  Vascular  Procedures:    Significant Diagnostic Tests:  MRI brain 9/7 small acute infarcts left frontal and occipital lobes, moderate cerebral atrophy, sequelae of remote bilateral basal ganglia thalamic and cerebellar insults MRI cervical and thoracic spine 9/7>> mild cervical spondylosis  CT angiogram neck 9/7 >> 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 30% luminal narrowing of the innominate secondary to atheromatous plaque.  Approximately 50% narrowing of the left carotid bulb and 80-90% narrowing of the proximal left ICA secondary to atheromatous plaque.  Micro Data:    Antimicrobials:  Ancef  9/13>>   Interim history/subjective:  Cleviprex off, BP maintained within goal range Hypokalemia 3.3  WBC 5.9 HGB 8.9  Objective   Blood pressure (!) 107/51, pulse 63, temperature 98.5 F (36.9 C), temperature source Oral, resp. rate 12, height 5\' 9"  (1.753 m), weight 86.6 kg, SpO2 95 %.        Intake/Output Summary (Last 24 hours) at 03/18/2020 1357 Last data filed at 03/18/2020 1100 Gross per 24 hour  Intake 1465.32 ml  Output 2050 ml  Net -584.68 ml   Filed Weights   03/09/20 0201 03/14/20 0600 03/18/20 0600  Weight:  84.1 kg 88.7 kg 86.6 kg    Examination: General: Elderly male, Alert and Oriented x 3, OOB to chair, In NAD HENT: NCAT, incision open to air, no drainage noted, scant redness Lungs: Bilateral chest excursion, Clear throughout, diminished per bases Cardiovascular: S1-S2 , RRR, No RMG Abdomen: Soft nontender abdomen, BS +, ND, Body mass index is 28.19 kg/m. Extremities: No rash, no edema, no obvious deformities noted Neuro: Awake, alert, interactive, MAE x 4, Follows commands , nonfocal Strong cough, Using IS  Labs show stable anemia, mild hypokalemia,  Continuing improved renal function  Resolved Hospital Problem list     Assessment & Plan:  Acute respiratory failure related to right upper lobe atelectasis likely from aspirated blood due to airway bleeding,?  Traumatic intubation for emergent surgery -resolved on RA -pulmonary Toilet -Continue incentive spirometry -Mobilize in a chair or upright - Ongoing aggressive pulmonary toilet  Hypertensive urgency - - Cleviprex off - BP within goal range - cont coreg, perhaps need to increase after monitoring with increased losartan - continue amlodipine 10 mg -increase losartan  Acute CVA Hematoma post left CEA -aspirin and Plavix can be resumed per vascular -Mobilize per PT  Will transfer to [facility] , and move to 4N Neuro Progressive starting 03/18/2020  Best practice:  Diet: cardiac Pain/Anxiety/Delirium protocol (if indicated): N/A VAP protocol (if indicated): N/A DVT prophylaxis: SCDs GI prophylaxis: Protonix Glucose control: SSI Mobility: Bedrest Code Status: Full Family Communication: updated pt.  Disposition: ICU>> can transfer to Neuro progressive floor  Labs   CBC: Recent Labs  Lab 03/14/20 0318 03/15/20 0445 03/16/20 7628  03/17/20 0352 03/18/20 0521  WBC 10.2 9.7 7.5 5.9 5.9  NEUTROABS  --   --   --  2.7 2.8  HGB 9.0* 9.3* 9.5* 9.3* 8.9*  HCT 27.9* 28.4* 29.7* 28.6* 28.2*  MCV 96.5 93.4 92.5 92.3 92.2    PLT 195 208 272 256 003    Basic Metabolic Panel: Recent Labs  Lab 03/13/20 0500 03/14/20 0318 03/14/20 0328 03/15/20 0445 03/16/20 0323 03/18/20 0521  NA 140 142  --  140 142 136  K 3.7 3.5  --  3.2* 3.9 3.3*  CL 109 110  --  108 108 106  CO2 24 25  --  25 26 27   GLUCOSE 142* 119*  --  126* 116* 111*  BUN 8 8  --  5* 8 12  CREATININE 1.13 1.02  --  0.96 0.94 1.13  CALCIUM 8.4* 8.5*  --  8.8* 9.1 8.9  MG  --   --  1.9  --  2.0  --    GFR: Estimated Creatinine Clearance: 62.5 mL/min (by C-G formula based on SCr of 1.13 mg/dL). Recent Labs  Lab 03/15/20 0445 03/16/20 0323 03/17/20 0352 03/18/20 0521  WBC 9.7 7.5 5.9 5.9   Critical care time: The patient is critically ill with multiple organ systems failure and requires high complexity decision making for assessment and support, frequent evaluation and titration of therapies, application of advanced monitoring technologies and extensive interpretation of multiple databases.  Critical care time 36 mins. This represents my time independent of the NPs time taking care of the pt. This is excluding procedures.    Magdalen Spatz, MSN, AGACNP-BC [place] for personal pager PCCM on call pager [phone] 03/18/2020, 1:57 PM   PCCM attending:  This is a 74 year old gentleman, left frontal and occipital CVA underwent a left CEA on 03/11/2020.  Had a left neck washout for hematoma postop.  Transferred to the ICU for blood pressure management.  As well as new developed right upper lobe atelectasis and hypoxemia.  Patient doing well with no complaints this morning.  Sitting comfortably in chair.  BP (!) 107/51   Pulse 63   Temp 98.5 F (36.9 C) (Oral)   Resp 12   Ht 5\' 9"  (1.753 m)   Wt 86.6 kg   SpO2 95%   BMI 28.19 kg/m   General: Elderly male resting in bed. Heart: Regular rhythm S1-S2  lungs: Clear to auscultation bilaterally no wheeze  Labs:  Reviewed  Assessment/plan: Acute hypoxemic respiratory failure with right upper lobe atelectasis, possibly aspirated into airway.  This is now resolved. Hypertensive urgency off Cleviprex, continuing of Coreg plus losartan Acute CVA status post left CEA and postop hematoma, continue aspirin and Plavix per vascular surgery. -Patient stable for transfer to hospitalist service from the intensive care unit.  Garner Nash, DO [facility] 03/18/2020 3:02 PM

## 2020-03-18 NOTE — Progress Notes (Signed)
Occupational Therapy Treatment Patient Details Name: [name] [name] MRN: [id number] DOB: March 14, 1946 Today's Date: 03/18/2020    History of present illness Pt is a 74 y/o male admitted with confusion, hypoglycemia, and a fall. 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 Pt s/p Left CEA on 9/10 with post procedure hemorrhage requiring evacuation and hypoxia. PMHx: memory loss, CKD, DM2, AAA, HTN   OT comments  Pt fatigued after being up in chair all day. Ambulated to bathroom for toileting and standing grooming and returned to bed with supervision to min guard assist. Pt recalling many events from yesterday's visit. Continues to report blurriness and inability to read.   Follow Up Recommendations  Outpatient OT;Supervision/Assistance - 24 hour    Equipment Recommendations  None recommended by OT    Recommendations for Other Services      Precautions / Restrictions Precautions Precautions: Fall       Mobility Bed Mobility Overal bed mobility: Needs Assistance Bed Mobility: Sit to Supine       Sit to supine: Supervision   General bed mobility comments: supervision for lines, no physical assist  Transfers Overall transfer level: Needs assistance Equipment used: None Transfers: Sit to/from Stand Sit to Stand: Supervision         General transfer comment: for safety and lines, slow to rise    Balance Overall balance assessment: Needs assistance   Sitting balance-Leahy Scale: Good       Standing balance-Leahy Scale: Good                             ADL either performed or assessed with clinical judgement   ADL Overall ADL's : Needs assistance/impaired     Grooming: Wash/dry hands;Standing;Supervision/safety Grooming Details (indicate cue type and reason): cueing to wash hands after toileting         Upper Body Dressing : Minimal assistance;Sitting Upper Body Dressing Details (indicate cue type and reason):  to doff front opening gown     Toilet Transfer: Min guard;Ambulation   Toileting- Clothing Manipulation and Hygiene: Sit to/from stand;Supervision/safety       Functional mobility during ADLs: Min guard       Vision   Additional Comments: continues to c/o L eye blurriness, inability to read   Perception     Praxis      Cognition Arousal/Alertness: Awake/alert Behavior During Therapy: WFL for tasks assessed/performed Overall Cognitive Status: History of cognitive impairments - at baseline                                 General Comments: impaired memory at baseline, but recalling events of yesterday         Exercises     Shoulder Instructions       General Comments      Pertinent Vitals/ Pain       Pain Assessment: No/denies pain  Home Living                                          Prior Functioning/Environment              Frequency  Min 2X/week        Progress Toward Goals  OT Goals(current goals can now be found in the care  plan section)  Progress towards OT goals: Progressing toward goals  Acute Rehab OT Goals Patient Stated Goal: home ASAP OT Goal Formulation: With patient Time For Goal Achievement: 03/23/20 Potential to Achieve Goals: Good  Plan Discharge plan remains appropriate    Co-evaluation                 AM-PAC OT "6 Clicks" Daily Activity     Outcome Measure   Help from another person eating meals?: None Help from another person taking care of personal grooming?: A Little Help from another person toileting, which includes using toliet, bedpan, or urinal?: A Little Help from another person bathing (including washing, rinsing, drying)?: A Little Help from another person to put on and taking off regular upper body clothing?: A Little Help from another person to put on and taking off regular lower body clothing?: A Little 6 Click Score: 19    End of Session    OT Visit Diagnosis:  History of falling (Z91.81);Other symptoms and signs involving cognitive function   Activity Tolerance Patient tolerated treatment well   Patient Left in bed;with call bell/phone within reach   Nurse Communication          Time: 5883-2549 OT Time Calculation (min): 17 min  Charges: OT General Charges $OT Visit: 1 Visit OT Treatments $Self Care/Home Management : 8-22 mins  Nestor Lewandowsky, OTR/L Acute Rehabilitation Services Pager: [phone] Office: [phone]   [name] [name] 03/18/2020, 3:11 PM

## 2020-03-19 LAB — BASIC METABOLIC PANEL
Anion gap: 9 (ref 5–15)
BUN: 15 mg/dL (ref 8–23)
CO2: 24 mmol/L (ref 22–32)
Calcium: 9.3 mg/dL (ref 8.9–10.3)
Chloride: 105 mmol/L (ref 98–111)
Creatinine, Ser: 1.22 mg/dL (ref 0.61–1.24)
GFR calc Af Amer: >60 mL/min (ref >60)
GFR calc non Af Amer: 58 mL/min — ABNORMAL LOW (ref >60)
Glucose, Bld: 105 mg/dL — ABNORMAL HIGH (ref 70–99)
Potassium: 3.9 mmol/L (ref 3.5–5.1)
Sodium: 138 mmol/L (ref 135–145)

## 2020-03-19 LAB — CBC WITH DIFFERENTIAL/PLATELET
Abs Immature Granulocytes: 0.13 10*3/uL — ABNORMAL HIGH (ref 0.00–0.07)
Basophils Absolute: 0.1 10*3/uL (ref 0.0–0.1)
Basophils Relative: 1 %
Eosinophils Absolute: 0.5 10*3/uL (ref 0.0–0.5)
Eosinophils Relative: 7 %
HCT: 30.2 % — ABNORMAL LOW (ref 39.0–52.0)
Hemoglobin: 9.7 g/dL — ABNORMAL LOW (ref 13.0–17.0)
Immature Granulocytes: 2 %
Lymphocytes Relative: 33 %
Lymphs Abs: 2.2 10*3/uL (ref 0.7–4.0)
MCH: 29.4 pg (ref 26.0–34.0)
MCHC: 32.1 g/dL (ref 30.0–36.0)
MCV: 91.5 fL (ref 80.0–100.0)
Monocytes Absolute: 0.8 10*3/uL (ref 0.1–1.0)
Monocytes Relative: 11 %
Neutro Abs: 3.2 10*3/uL (ref 1.7–7.7)
Neutrophils Relative %: 46 %
Platelets: 239 10*3/uL (ref 150–400)
RBC: 3.3 MIL/uL — ABNORMAL LOW (ref 4.22–5.81)
RDW: 12.8 % (ref 11.5–15.5)
WBC: 6.9 10*3/uL (ref 4.0–10.5)
nRBC: 0 % (ref 0.0–0.2)

## 2020-03-19 LAB — GLUCOSE, CAPILLARY
Glucose-Capillary: 127 mg/dL — ABNORMAL HIGH (ref 70–99)
Glucose-Capillary: 133 mg/dL — ABNORMAL HIGH (ref 70–99)
Glucose-Capillary: 139 mg/dL — ABNORMAL HIGH (ref 70–99)
Glucose-Capillary: 189 mg/dL — ABNORMAL HIGH (ref 70–99)

## 2020-03-19 LAB — MAGNESIUM: Magnesium: 1.8 mg/dL (ref 1.7–2.4)

## 2020-03-19 MED ORDER — ASPIRIN EC 81 MG PO TBEC
81.0000 mg | DELAYED_RELEASE_TABLET | Freq: Every day | ORAL | Status: DC
  Administered 2020-03-19 – 2020-03-23 (×5): 81 mg via ORAL
  Filled 2020-03-19 (×5): qty 1

## 2020-03-19 NOTE — Progress Notes (Signed)
PROGRESS NOTE    [name] [name]  OEV:[id number] DOB: Nov 10, 1945 DOA: 03/08/2020 PCP: [name], MD   Brief Narrative:  This 74 yrs old M admitted with CVA and underwent Left carotid endarterectomy on 9/10 with postop complication of large hematoma secondary to uncontrolled blood pressure s/p washout. He was transferred to ICU , started on cleviprex which was weaned down on 9/16; and now blood pressure well controlled on losartan, Norvasc, Coreg, HCTZ.  He was briefly hypoxic now on room air saturating normal.  PCCM pick up 9/18.  Vascular surgery following.    Assessment & Plan:   Principal Problem:   Acute ischemic stroke [facility]) Active Problems:   Diabetes mellitus type II, non insulin dependent (HCC)   Memory loss   Renal insufficiency   Hypertension   Stroke [facility])   Carotid stenosis, symptomatic, with infarction (HCC)   Hematoma of neck   Acute respiratory failure with hypoxia (HCC)   Acute CVA : MRI brain 9/7 small acute infarcts left frontal and occipital lobes, moderate cerebral atrophy, sequelae of remote bilateral basal ganglia thalamic and cerebellar insults. He underwent left carotid endarterectomy, developed hematoma postoperatively. S/p hematoma evacuation, tolerated well. Hematoma post left CEA. -Vascular surgery is following, recommended to resume aspirin and hold Plavix for now -Mobilize per PT  Hypertensive urgency -improved - Cleviprex off. - BP within goal range - cont coreg - continue amlodipine 10 mg -increase losartan.   Left neck hematoma :  Hematoma appears stable, still has significant swelling. Vascular surgery is following recommended to resume aspirin and hold Plavix for now.   DVT prophylaxis: SCDs Code Status: Full Family Communication: No one at bed side. Disposition Plan:  Status is: Inpatient  Remains inpatient appropriate because:Inpatient level of care appropriate due to severity of illness   Dispo: The patient is from:  Home              Anticipated d/c is to: Home              Anticipated d/c date is: 3 days              Patient currently is not medically stable to d/c.   Consultants:   Vascular surgery.  Procedures: Left carotid endarterectomy, evacuation of hematoma. Antimicrobials: Anti-infectives (From admission, onward)   Start     Dose/Rate Route Frequency Ordered Stop   03/14/20 1600  ceFAZolin (ANCEF) IVPB 1 g/50 mL premix        1 g 100 mL/hr over 30 Minutes Intravenous Every 8 hours 03/14/20 1516     03/11/20 1800  ceFAZolin (ANCEF) IVPB 2g/100 mL premix        2 g 200 mL/hr over 30 Minutes Intravenous Every 8 hours 03/11/20 1740 03/12/20 0138     Subjective: Patient was seen and examined at bedside.  Overnight events noted.  Patient reports feeling better. He still has significant swelling on the left neck.  He denies any dizziness, reports pain is better controlled.  Objective: Vitals:   03/19/20 0813 03/19/20 0816 03/19/20 0841 03/19/20 1115  BP:   (!) 115/56 111/71  Pulse:   67 63  Resp:   11 11  Temp:   97.8 F (36.6 C) 98.6 F (37 C)  TempSrc:    Oral  SpO2: 94% 98% 98% 95%  Weight:      Height:        Intake/Output Summary (Last 24 hours) at 03/19/2020 1537 Last data filed at 03/19/2020 1116 Gross per  24 hour  Intake 553.19 ml  Output 2200 ml  Net -1646.81 ml   Filed Weights   03/14/20 0600 03/18/20 0600 03/19/20 0746  Weight: 88.7 kg 86.6 kg 84.7 kg    Examination:  General exam: Appears calm and comfortable, Left neck swelling   Respiratory system: Clear to auscultation. Respiratory effort normal. Cardiovascular system: S1 & S2 heard, RRR. No JVD, murmurs, rubs, gallops or clicks. No pedal edema. Gastrointestinal system: Abdomen is nondistended, soft and nontender. No organomegaly or masses felt. Normal bowel sounds heard. Central nervous system: Alert and oriented. No focal neurological deficits. Extremities: No edema, no cyanosis, no clubbing. Skin: No  rashes, lesions or ulcers Psychiatry: Judgement and insight appear normal. Mood & affect appropriate.     Data Reviewed: I have personally reviewed following labs and imaging studies  CBC: Recent Labs  Lab 03/15/20 0445 03/16/20 0323 03/17/20 0352 03/18/20 0521 03/19/20 0147  WBC 9.7 7.5 5.9 5.9 6.9  NEUTROABS  --   --  2.7 2.8 3.2  HGB 9.3* 9.5* 9.3* 8.9* 9.7*  HCT 28.4* 29.7* 28.6* 28.2* 30.2*  MCV 93.4 92.5 92.3 92.2 91.5  PLT 208 272 256 256 373   Basic Metabolic Panel: Recent Labs  Lab 03/14/20 0318 03/14/20 0328 03/15/20 0445 03/16/20 0323 03/18/20 0521 03/19/20 0147  NA 142  --  140 142 136 138  K 3.5  --  3.2* 3.9 3.3* 3.9  CL 110  --  108 108 106 105  CO2 25  --  25 26 27 24   GLUCOSE 119*  --  126* 116* 111* 105*  BUN 8  --  5* 8 12 15   CREATININE 1.02  --  0.96 0.94 1.13 1.22  CALCIUM 8.5*  --  8.8* 9.1 8.9 9.3  MG  --  1.9  --  2.0  --  1.8   GFR: Estimated Creatinine Clearance: 53.1 mL/min (by C-G formula based on SCr of 1.22 mg/dL). Liver Function Tests: Recent Labs  Lab 03/13/20 0500 03/15/20 0445  AST 15 17  ALT 12 12  ALKPHOS 80 96  BILITOT 0.5 0.5  PROT 5.1* 5.7*  ALBUMIN 2.8* 2.8*   No results for input(s): LIPASE, AMYLASE in the last 168 hours. No results for input(s): AMMONIA in the last 168 hours. Coagulation Profile: No results for input(s): INR, PROTIME in the last 168 hours. Cardiac Enzymes: No results for input(s): CKTOTAL, CKMB, CKMBINDEX, TROPONINI in the last 168 hours. BNP (last 3 results) No results for input(s): PROBNP in the last 8760 hours. HbA1C: No results for input(s): HGBA1C in the last 72 hours. CBG: Recent Labs  Lab 03/18/20 1129 03/18/20 1541 03/18/20 2208 03/19/20 0723 03/19/20 1113  GLUCAP 113* 140* 119* 127* 133*   Lipid Profile: No results for input(s): CHOL, HDL, LDLCALC, TRIG, CHOLHDL, LDLDIRECT in the last 72 hours. Thyroid Function Tests: No results for input(s): TSH, T4TOTAL, FREET4, T3FREE,  THYROIDAB in the last 72 hours. Anemia Panel: No results for input(s): VITAMINB12, FOLATE, FERRITIN, TIBC, IRON, RETICCTPCT in the last 72 hours. Sepsis Labs: No results for input(s): PROCALCITON, LATICACIDVEN in the last 168 hours.  Recent Results (from the past 240 hour(s))  MRSA PCR Screening     Status: None   Collection Time: 03/11/20  6:34 AM   Specimen: Nasopharyngeal  Result Value Ref Range Status   MRSA by PCR NEGATIVE NEGATIVE Final    Comment:        The GeneXpert MRSA Assay (FDA approved for NASAL specimens only),  is one component of a comprehensive MRSA colonization surveillance program. It is not intended to diagnose MRSA infection nor to guide or monitor treatment for MRSA infections. Performed at [facility], [place] [address] [place], [place] [address]     Radiology Studies: No results found.  Scheduled Meds: . amLODipine  10 mg Oral Daily  . aspirin EC  81 mg Oral Daily  . carvedilol  25 mg Oral BID WC  . Chlorhexidine Gluconate Cloth  6 each Topical Daily  . citalopram  20 mg Oral Daily  . donepezil  10 mg Oral QHS  . folic acid  1 mg Oral Daily  . hydrochlorothiazide  50 mg Oral Daily  . insulin aspart  0-5 Units Subcutaneous QHS  . insulin aspart  0-9 Units Subcutaneous TID WC  . losartan  100 mg Oral Daily  . memantine  10 mg Oral BID  . pantoprazole  40 mg Oral Daily  . rosuvastatin  40 mg Oral Q2000  . vitamin B-12  1,000 mcg Oral Daily   Continuous Infusions: . sodium chloride 500 mL (03/19/20 0527)  . sodium chloride    .  ceFAZolin (ANCEF) IV 1 g (03/19/20 0556)  . magnesium sulfate bolus IVPB       LOS: 10 days    Time spent:  35 mins.    Shawna Clamp, MD [facility]   If 7PM-7AM, please contact night-coverage

## 2020-03-19 NOTE — Progress Notes (Addendum)
  Progress Note    03/19/2020 8:59 AM 7 Days Post-Op  Subjective:  No complaints.  Sitting up in chair comfortably.  Denies trouble swallowing.  Afebrile HR 50's-70's  446'K-863'O systolic 17% RA  Vitals:   03/19/20 0813 03/19/20 0816  BP:    Pulse:    Resp:    Temp:    SpO2: 94% 98%     Physical Exam: Neuro:  In tact Lungs:  Non labored Incision:  Clean with moderate stable left neck hematoma  CBC    Component Value Date/Time   WBC 6.9 03/19/2020 0147   RBC 3.30 (L) 03/19/2020 0147   HGB 9.7 (L) 03/19/2020 0147   HCT 30.2 (L) 03/19/2020 0147   PLT 239 03/19/2020 0147   MCV 91.5 03/19/2020 0147   MCH 29.4 03/19/2020 0147   MCHC 32.1 03/19/2020 0147   RDW 12.8 03/19/2020 0147   LYMPHSABS 2.2 03/19/2020 0147   MONOABS 0.8 03/19/2020 0147   EOSABS 0.5 03/19/2020 0147   BASOSABS 0.1 03/19/2020 0147    BMET    Component Value Date/Time   NA 138 03/19/2020 0147   K 3.9 03/19/2020 0147   CL 105 03/19/2020 0147   CO2 24 03/19/2020 0147   GLUCOSE 105 (H) 03/19/2020 0147   BUN 15 03/19/2020 0147   CREATININE 1.22 03/19/2020 0147   CALCIUM 9.3 03/19/2020 0147   GFRNONAA 58 (L) 03/19/2020 0147   GFRAA >60 03/19/2020 0147     Intake/Output Summary (Last 24 hours) at 03/19/2020 0859 Last data filed at 03/19/2020 0720 Gross per 24 hour  Intake 1033.19 ml  Output 2225 ml  Net -1191.81 ml     Assessment/Plan:  This is a 74 y.o. male who is s/p left CEA 7 Days Post-Op  -pt is doing well this am. -pt neuro exam is in tact -pt transferred from unit yesterday with much better blood pressure control.  Hematoma left neck stable and hgb improved from yesterday.  Will restart baby aspirin today.  Would continue to hold on other [facility].    Leontine Locket, PA-C [facility] [phone]   I have interviewed and examined patient with PA and agree with assessment and plan above.  Restart aspirin hold Plavix anticoagulation for now given postoperative  hematoma.  Kali Deadwyler C. Donzetta Matters, MD [facility] [place] Office: [phone] Pager: [phone]

## 2020-03-20 LAB — BASIC METABOLIC PANEL
Anion gap: 9 (ref 5–15)
BUN: 19 mg/dL (ref 8–23)
CO2: 24 mmol/L (ref 22–32)
Calcium: 9.5 mg/dL (ref 8.9–10.3)
Chloride: 105 mmol/L (ref 98–111)
Creatinine, Ser: 1.23 mg/dL (ref 0.61–1.24)
GFR calc Af Amer: >60 mL/min (ref >60)
GFR calc non Af Amer: 57 mL/min — ABNORMAL LOW (ref >60)
Glucose, Bld: 118 mg/dL — ABNORMAL HIGH (ref 70–99)
Potassium: 4 mmol/L (ref 3.5–5.1)
Sodium: 138 mmol/L (ref 135–145)

## 2020-03-20 LAB — CBC
HCT: 32.4 % — ABNORMAL LOW (ref 39.0–52.0)
Hemoglobin: 10.6 g/dL — ABNORMAL LOW (ref 13.0–17.0)
MCH: 30.4 pg (ref 26.0–34.0)
MCHC: 32.7 g/dL (ref 30.0–36.0)
MCV: 92.8 fL (ref 80.0–100.0)
Platelets: 254 10*3/uL (ref 150–400)
RBC: 3.49 MIL/uL — ABNORMAL LOW (ref 4.22–5.81)
RDW: 13.1 % (ref 11.5–15.5)
WBC: 8.1 10*3/uL (ref 4.0–10.5)
nRBC: 0 % (ref 0.0–0.2)

## 2020-03-20 LAB — GLUCOSE, CAPILLARY
Glucose-Capillary: 144 mg/dL — ABNORMAL HIGH (ref 70–99)
Glucose-Capillary: 160 mg/dL — ABNORMAL HIGH (ref 70–99)
Glucose-Capillary: 102 mg/dL — ABNORMAL HIGH (ref 70–99)
Glucose-Capillary: 151 mg/dL — ABNORMAL HIGH (ref 70–99)

## 2020-03-20 NOTE — Progress Notes (Addendum)
PROGRESS NOTE    [name] [name]  ZMO:[id number] DOB: 22-Jun-1946 DOA: 03/08/2020 PCP: [name], MD   Brief Narrative:  This 74 yrs old M admitted with CVA and underwent Left carotid endarterectomy on 9/10 with postop complication of large hematoma secondary to uncontrolled blood pressure s/p washout. He was transferred to ICU , started on cleviprex which was weaned down on 9/16; and now blood pressure well controlled on losartan, Norvasc, Coreg, HCTZ.  He was briefly hypoxic now on room air saturating normal.  PCCM pick up 9/18.  Vascular surgery following, hematoma is stable, H and H stable, Aspirin resumed.   Assessment & Plan:   Principal Problem:   Acute ischemic stroke [facility]) Active Problems:   Diabetes mellitus type II, non insulin dependent (HCC)   Memory loss   Renal insufficiency   Hypertension   Stroke [facility])   Carotid stenosis, symptomatic, with infarction (HCC)   Hematoma of neck   Acute respiratory failure with hypoxia (HCC)   Acute CVA : MRI brain 9/7 small acute infarcts left frontal and occipital lobes, moderate cerebral atrophy, sequelae of remote bilateral basal ganglia thalamic and cerebellar insults. He underwent left carotid endarterectomy 9/10, developed hematoma postoperatively. S/p hematoma evacuation, tolerated well. Hematoma post left CEA. Hematoma stable, H/H stable. -Vascular surgery is following, recommended to resume aspirin and hold Plavix for now -Mobilize per PT  Hypertensive urgency -improved - Cleviprex off. - BP within goal range. - cont coreg - continue amlodipine 10 mg -increase losartan.   Left neck hematoma :  Hematoma appears stable, still has significant swelling. Vascular surgery is following recommended to resume aspirin and hold Plavix for now.   DVT prophylaxis: SCDs Code Status: Full Family Communication:   Spoke with  Wife [name]. Disposition Plan:  Status is: Inpatient  Remains inpatient appropriate  because:Inpatient level of care appropriate due to severity of illness   Dispo: The patient is from: Home              Anticipated d/c is to: Home              Anticipated d/c date is: 1-2 days.              Patient currently is not medically stable to d/c.   Consultants:   Vascular surgery.  Procedures: Left carotid endarterectomy, evacuation of hematoma. Antimicrobials: Anti-infectives (From admission, onward)   Start     Dose/Rate Route Frequency Ordered Stop   03/14/20 1600  ceFAZolin (ANCEF) IVPB 1 g/50 mL premix        1 g 100 mL/hr over 30 Minutes Intravenous Every 8 hours 03/14/20 1516     03/11/20 1800  ceFAZolin (ANCEF) IVPB 2g/100 mL premix        2 g 200 mL/hr over 30 Minutes Intravenous Every 8 hours 03/11/20 1740 03/12/20 0138     Subjective: Patient was seen and examined at bedside.  Overnight events noted.  He still has significant swelling on the left neck.   He denies any dizziness, reports pain is better controlled. There is slight oozing noted from hematoma.   Objective: Vitals:   03/20/20 0500 03/20/20 0824 03/20/20 0825 03/20/20 1113  BP: 124/63 (!) 116/55  (!) 97/55  Pulse:  66  62  Resp: 13 (!) 9  12  Temp: 97.8 F (36.6 C) 97.9 F (36.6 C)  98.3 F (36.8 C)  TempSrc: Oral Oral  Oral  SpO2: 100%  94% 95%  Weight:  Height:        Intake/Output Summary (Last 24 hours) at 03/20/2020 1354 Last data filed at 03/20/2020 0840 Gross per 24 hour  Intake 200 ml  Output 1150 ml  Net -950 ml   Filed Weights   03/14/20 0600 03/18/20 0600 03/19/20 0746  Weight: 88.7 kg 86.6 kg 84.7 kg    Examination:  General exam: Appears calm and comfortable, Left neck swelling. Slight oozing noted.  Respiratory system: Clear to auscultation. Respiratory effort normal. Cardiovascular system: S1 & S2 heard, RRR. No JVD, murmurs, rubs, gallops or clicks. No pedal edema. Gastrointestinal system: Abdomen is nondistended, soft and nontender. No organomegaly or  masses felt. Normal bowel sounds heard. Central nervous system: Alert and oriented. No focal neurological deficits. Extremities: No edema, no cyanosis, no clubbing. Skin: No rashes, lesions or ulcers Psychiatry: Judgement and insight appear normal. Mood & affect appropriate.     Data Reviewed: I have personally reviewed following labs and imaging studies  CBC: Recent Labs  Lab 03/16/20 0323 03/17/20 0352 03/18/20 0521 03/19/20 0147 03/20/20 0603  WBC 7.5 5.9 5.9 6.9 8.1  NEUTROABS  --  2.7 2.8 3.2  --   HGB 9.5* 9.3* 8.9* 9.7* 10.6*  HCT 29.7* 28.6* 28.2* 30.2* 32.4*  MCV 92.5 92.3 92.2 91.5 92.8  PLT 272 256 256 239 098   Basic Metabolic Panel: Recent Labs  Lab 03/14/20 0318 03/14/20 0328 03/15/20 0445 03/16/20 0323 03/18/20 0521 03/19/20 0147 03/20/20 0603  NA   < >  --  140 142 136 138 138  K   < >  --  3.2* 3.9 3.3* 3.9 4.0  CL   < >  --  108 108 106 105 105  CO2   < >  --  25 26 27 24 24   GLUCOSE   < >  --  126* 116* 111* 105* 118*  BUN   < >  --  5* 8 12 15 19   CREATININE   < >  --  0.96 0.94 1.13 1.22 1.23  CALCIUM   < >  --  8.8* 9.1 8.9 9.3 9.5  MG  --  1.9  --  2.0  --  1.8  --    < > = values in this interval not displayed.   GFR: Estimated Creatinine Clearance: 52.7 mL/min (by C-G formula based on SCr of 1.23 mg/dL). Liver Function Tests: Recent Labs  Lab 03/15/20 0445  AST 17  ALT 12  ALKPHOS 96  BILITOT 0.5  PROT 5.7*  ALBUMIN 2.8*   No results for input(s): LIPASE, AMYLASE in the last 168 hours. No results for input(s): AMMONIA in the last 168 hours. Coagulation Profile: No results for input(s): INR, PROTIME in the last 168 hours. Cardiac Enzymes: No results for input(s): CKTOTAL, CKMB, CKMBINDEX, TROPONINI in the last 168 hours. BNP (last 3 results) No results for input(s): PROBNP in the last 8760 hours. HbA1C: No results for input(s): HGBA1C in the last 72 hours. CBG: Recent Labs  Lab 03/19/20 1113 03/19/20 1731 03/19/20 2027  03/20/20 0714 03/20/20 1116  GLUCAP 133* 139* 189* 144* 160*   Lipid Profile: No results for input(s): CHOL, HDL, LDLCALC, TRIG, CHOLHDL, LDLDIRECT in the last 72 hours. Thyroid Function Tests: No results for input(s): TSH, T4TOTAL, FREET4, T3FREE, THYROIDAB in the last 72 hours. Anemia Panel: No results for input(s): VITAMINB12, FOLATE, FERRITIN, TIBC, IRON, RETICCTPCT in the last 72 hours. Sepsis Labs: No results for input(s): PROCALCITON, LATICACIDVEN in the last 168  hours.  Recent Results (from the past 240 hour(s))  MRSA PCR Screening     Status: None   Collection Time: 03/11/20  6:34 AM   Specimen: Nasopharyngeal  Result Value Ref Range Status   MRSA by PCR NEGATIVE NEGATIVE Final    Comment:        The GeneXpert MRSA Assay (FDA approved for NASAL specimens only), is one component of a comprehensive MRSA colonization surveillance program. It is not intended to diagnose MRSA infection nor to guide or monitor treatment for MRSA infections. Performed at [facility], [place] [address], [place], [place] [address]     Radiology Studies: No results found.  Scheduled Meds: . amLODipine  10 mg Oral Daily  . aspirin EC  81 mg Oral Daily  . carvedilol  25 mg Oral BID WC  . Chlorhexidine Gluconate Cloth  6 each Topical Daily  . citalopram  20 mg Oral Daily  . donepezil  10 mg Oral QHS  . folic acid  1 mg Oral Daily  . hydrochlorothiazide  50 mg Oral Daily  . insulin aspart  0-5 Units Subcutaneous QHS  . insulin aspart  0-9 Units Subcutaneous TID WC  . losartan  100 mg Oral Daily  . memantine  10 mg Oral BID  . pantoprazole  40 mg Oral Daily  . rosuvastatin  40 mg Oral Q2000  . vitamin B-12  1,000 mcg Oral Daily   Continuous Infusions: . sodium chloride 500 mL (03/20/20 0720)  . sodium chloride    .  ceFAZolin (ANCEF) IV 1 g (03/20/20 0724)  . magnesium sulfate bolus IVPB       LOS: 11 days    Time spent:  25 mins.    Shawna Clamp, MD [facility]   If 7PM-7AM, please contact night-coverage

## 2020-03-20 NOTE — Progress Notes (Addendum)
°  Progress Note    03/20/2020 7:23 AM 8 Days Post-Op  Subjective:  No complaints;  Says his left eye has been fuzzy since he woke up from surgery.  No difficulty swallowing.  Afebrile HR 50's-60's 329'V-916'O systolic 060% RA  Vitals:   03/20/20 0009 03/20/20 0500  BP: (!) 122/59 124/63  Pulse:    Resp: 11 13  Temp:  97.8 F (36.6 C)  SpO2:  100%    Physical Exam: General:  No distress Lungs:  Non labored Incisions:  Clean with hematoma that is stable Extremities:  Moving all extremities equally   CBC    Component Value Date/Time   WBC 8.1 03/20/2020 0603   RBC 3.49 (L) 03/20/2020 0603   HGB 10.6 (L) 03/20/2020 0603   HCT 32.4 (L) 03/20/2020 0603   PLT 254 03/20/2020 0603   MCV 92.8 03/20/2020 0603   MCH 30.4 03/20/2020 0603   MCHC 32.7 03/20/2020 0603   RDW 13.1 03/20/2020 0603   LYMPHSABS 2.2 03/19/2020 0147   MONOABS 0.8 03/19/2020 0147   EOSABS 0.5 03/19/2020 0147   BASOSABS 0.1 03/19/2020 0147    BMET    Component Value Date/Time   NA 138 03/19/2020 0147   K 3.9 03/19/2020 0147   CL 105 03/19/2020 0147   CO2 24 03/19/2020 0147   GLUCOSE 105 (H) 03/19/2020 0147   BUN 15 03/19/2020 0147   CREATININE 1.22 03/19/2020 0147   CALCIUM 9.3 03/19/2020 0147   GFRNONAA 58 (L) 03/19/2020 0147   GFRAA >60 03/19/2020 0147    INR    Component Value Date/Time   INR 0.9 03/11/2020 0139     Intake/Output Summary (Last 24 hours) at 03/20/2020 0723 Last data filed at 03/20/2020 0300 Gross per 24 hour  Intake --  Output 1550 ml  Net -1550 ml     Assessment:  74 y.o. male is s/p:  Left CEA  8 Days Post-Op  Plan: -pt doing well this morning; BP good control -baby asa started yesterday-hematoma stable and hgb continues to improve.     Leontine Locket, PA-C [facility] [phone] 03/20/2020 7:23 AM  I have independently interviewed and examined patient and agree with PA assessment and plan above. Neck hematoma and h/h stable.  Aspirin has been initiated.   Pravin Perezperez C. Donzetta Matters, MD [facility] [place] Office: [phone] Pager: [phone]

## 2020-03-21 LAB — HEMOGLOBIN AND HEMATOCRIT, BLOOD
HCT: 29.7 % — ABNORMAL LOW (ref 39.0–52.0)
Hemoglobin: 9.8 g/dL — ABNORMAL LOW (ref 13.0–17.0)

## 2020-03-21 LAB — GLUCOSE, CAPILLARY
Glucose-Capillary: 118 mg/dL — ABNORMAL HIGH (ref 70–99)
Glucose-Capillary: 137 mg/dL — ABNORMAL HIGH (ref 70–99)
Glucose-Capillary: 195 mg/dL — ABNORMAL HIGH (ref 70–99)
Glucose-Capillary: 187 mg/dL — ABNORMAL HIGH (ref 70–99)

## 2020-03-21 MED ORDER — INFLUENZA VAC A&B SA ADJ QUAD 0.5 ML IM PRSY
0.5000 mL | PREFILLED_SYRINGE | INTRAMUSCULAR | Status: AC
  Administered 2020-03-22: 0.5 mL via INTRAMUSCULAR
  Filled 2020-03-21: qty 0.5

## 2020-03-21 NOTE — Progress Notes (Addendum)
  Progress Note    03/21/2020 6:59 AM 9 Days Post-Op  Subjective:  Sleeping - wakes easily.   Afebrile HR 50's-60's NSR 678'L-381'O systolic 17% RA  Vitals:   03/21/20 0045 03/21/20 0447  BP: (!) 102/49 (!) 109/58  Pulse: (!) 57 64  Resp: 12 20  Temp: 97.7 F (36.5 C) 98 F (36.7 C)  SpO2: 97% 97%    Physical Exam: General:  Sleeping comfortably Lungs:  Non labored Incisions:  Hematoma stable   CBC    Component Value Date/Time   WBC 8.1 03/20/2020 0603   RBC 3.49 (L) 03/20/2020 0603   HGB 9.8 (L) 03/21/2020 0148   HCT 29.7 (L) 03/21/2020 0148   PLT 254 03/20/2020 0603   MCV 92.8 03/20/2020 0603   MCH 30.4 03/20/2020 0603   MCHC 32.7 03/20/2020 0603   RDW 13.1 03/20/2020 0603   LYMPHSABS 2.2 03/19/2020 0147   MONOABS 0.8 03/19/2020 0147   EOSABS 0.5 03/19/2020 0147   BASOSABS 0.1 03/19/2020 0147    BMET    Component Value Date/Time   NA 138 03/20/2020 0603   K 4.0 03/20/2020 0603   CL 105 03/20/2020 0603   CO2 24 03/20/2020 0603   GLUCOSE 118 (H) 03/20/2020 0603   BUN 19 03/20/2020 0603   CREATININE 1.23 03/20/2020 0603   CALCIUM 9.5 03/20/2020 0603   GFRNONAA 57 (L) 03/20/2020 0603   GFRAA >60 03/20/2020 0603    INR    Component Value Date/Time   INR 0.9 03/11/2020 0139     Intake/Output Summary (Last 24 hours) at 03/21/2020 0659 Last data filed at 03/21/2020 0449 Gross per 24 hour  Intake 400 ml  Output 1950 ml  Net -1550 ml     Assessment:  74 y.o. male is s/p:  Left CEA  9 Days Post-Op  Plan: -baby asa restarted a couple of days ago.  hgb drifted down slightly.  Continue to hold Plavix for now.  Will check CBC in am.   -DVT prophylaxis:  SCD's -continue to mobilize and OOB   [name] Locket, PA-C [facility] [phone] 03/21/2020 6:59 AM   I have independently interviewed and examined patient and agree with PA assessment and plan above.   [name] [name] C. Donzetta Matters, MD [facility]  [place] Office: [phone] Pager: [phone]

## 2020-03-21 NOTE — Progress Notes (Signed)
Occupational Therapy Treatment Patient Details Name: [name] [name] MRN: [id number] DOB: 1945/07/12 Today's Date: 03/21/2020    History of present illness Pt is a 74 y/o male admitted with confusion, hypoglycemia, and a fall. 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 Pt s/p Left CEA on 9/10 with post procedure hemorrhage requiring evacuation and hypoxia. PMHx: memory loss, CKD, DM2, AAA, HTN   OT comments  Patient's vision tested this date for field cut and tracking.  He continues to complain of R eye blurred greater than L.  OT discussed follow up with eye doctor post acute for eye exam and prescription check.  Patient verbalized understanding.  Continue to follow in the acute setting.  No changes to discharge plan.    Follow Up Recommendations  Outpatient OT;Supervision/Assistance - 24 hour    Equipment Recommendations  None recommended by OT    Recommendations for Other Services      Precautions / Restrictions Precautions Precautions: Fall Restrictions Weight Bearing Restrictions: No       Mobility Bed Mobility                  Transfers Overall transfer level: Modified independent   Transfers: Sit to/from Stand Sit to Stand: Supervision         General transfer comment: Nurse removed IV, patient able to walk to BR, use toilet and wash hands sink side with SBA.       Vision Baseline Vision/History: Wears glasses Wears Glasses: At all times Patient Visual Report: Blurring of vision Additional Comments: Patient tested for visual field and tracking this date.  No field cut seen and eyes are tracking together.  Continues to c/o R blurry vision > than L eye with glasses on.    General Comments      Pertinent Vitals/ Pain                                                                  Frequency  Min 2X/week        Progress Toward Goals  OT Goals(current goals can now be found in  the care plan section)  Progress towards OT goals: Progressing toward goals  Acute Rehab OT Goals Potential to Achieve Goals: Good  Plan      Co-evaluation                 AM-PAC OT "6 Clicks" Daily Activity     Outcome Measure   Help from another person eating meals?: None Help from another person taking care of personal grooming?: A Little Help from another person toileting, which includes using toliet, bedpan, or urinal?: A Little Help from another person bathing (including washing, rinsing, drying)?: A Little Help from another person to put on and taking off regular upper body clothing?: A Little Help from another person to put on and taking off regular lower body clothing?: A Little 6 Click Score: 19    End of Session    OT Visit Diagnosis: History of falling (Z91.81);Other symptoms and signs involving cognitive function   Activity Tolerance Patient tolerated treatment well   Patient Left in bed;with call bell/phone within reach   Nurse Communication Other (comment) (nurse in room)        Time:  2703-5009 OT Time Calculation (min): 15 min  Charges: OT Treatments $Self Care/Home Management : 8-22 mins  03/21/2020  Rich, OTR/L  Acute Rehabilitation Services  Office:  [place] 03/21/2020, 4:12 PM

## 2020-03-21 NOTE — Progress Notes (Signed)
Mobility Specialist: Progress Note   03/21/20 1701  Mobility  Activity Ambulated in hall  Level of Assistance Independent  Assistive Device None  Distance Ambulated (ft) 1000 ft  Mobility Response Tolerated well  Mobility performed by Mobility specialist  Bed Position Semi-fowlers  $Mobility charge 1 Mobility   Pre-Mobility: 67 HR, 117/60 BP, 99% SpO2 During Mobility: 74 HR Post-Mobility: 76 HR, 122/64 BP, 100% SpO2  Pt had no c/o during ambulation.   [facility] Delayne Sanzo Mobility Specialist

## 2020-03-21 NOTE — Discharge Instructions (Signed)
   [facility] [place]  Discharge Instructions   Carotid Surgery  Please refer to the following instructions for your post-procedure care. Your surgeon or physician assistant will discuss any changes with you.  Activity  You are encouraged to walk as much as you can. You can slowly return to normal activities but must avoid strenuous activity and heavy lifting until your doctor tell you it's okay. Avoid activities such as vacuuming or swinging a golf club. You can drive after one week if you are comfortable and you are no longer taking prescription pain medications. It is normal to feel tired for serval weeks after your surgery. It is also normal to have difficulty with sleep habits, eating, and bowel movements after surgery. These will go away with time.  Bathing/Showering  Shower daily after you go home. Do not soak in a bathtub, hot tub, or swim until the incision heals completely.  Incision Care  Shower every day. Clean your incision with mild soap and water. Pat the area dry with a clean towel. You do not need a bandage unless otherwise instructed. Do not apply any ointments or creams to your incision. You may have skin glue on your incision. Do not peel it off. It will come off on its own in about one week. Your incision may feel thickened and raised for several weeks after your surgery. This is normal and the skin will soften over time.   For Men Only: It's okay to shave around the incision but do not shave the incision itself for 2 weeks. It is common to have numbness under your chin that could last for several months.  Diet  Resume your normal diet. There are no special food restrictions following this procedure. A low fat/low cholesterol diet is recommended for all patients with vascular disease. In order to heal from your surgery, it is CRITICAL to get adequate nutrition. Your body requires vitamins, minerals, and protein. Vegetables are the best source of  vitamins and minerals. Vegetables also provide the perfect balance of protein. Processed food has little nutritional value, so try to avoid this.  Medications  Resume taking all of your medications unless your doctor or physician assistant tells you not to. If your incision is causing pain, you may take over-the- counter pain relievers such as acetaminophen (Tylenol). If you were prescribed a stronger pain medication, please be aware these medications can cause nausea and constipation. Prevent nausea by taking the medication with a snack or meal. Avoid constipation by drinking plenty of fluids and eating foods with a high amount of fiber, such as fruits, vegetables, and grains.   Do not take Tylenol if you are taking prescription pain medications.  Follow Up  Our office will schedule a follow up appointment 2-3 weeks following discharge.  Please call us immediately for any of the following conditions  . Increased pain, redness, drainage (pus) from your incision site. . Fever of 101 degrees or higher. . If you should develop stroke (slurred speech, difficulty swallowing, weakness on one side of your body, loss of vision) you should call 911 and go to the nearest emergency room. .  Reduce your risk of vascular disease:  . Stop smoking. If you would like help call [facility] at 1-800-QUIT-NOW (1-800-784-8669) or [place] at 336-586-4000. . Manage your cholesterol . Maintain a desired weight . Control your diabetes . Keep your blood pressure down .  If you have any questions, please call the office at [phone]. 

## 2020-03-21 NOTE — Progress Notes (Signed)
Physical Therapy Treatment Patient Details Name: [name] [name] MRN: [id number] DOB: [birth year] Today's Date: 03/21/2020    History of Present Illness Pt is a 74 y/o male admitted with confusion, hypoglycemia, and a fall. 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 Pt s/p Left CEA on 9/10 with post procedure hemorrhage requiring evacuation and hypoxia. PMHx: memory loss, CKD, DM2, AAA, HTN    PT Comments    Pt very pleasant with continued edema left neck and pt concerned over lack of progression in edema. Pt with continued ability to be able to walk without assist with noted balance deficits with challenge. Recommended pt have cane with him at all times for gait particularly outside of the house and when walking dogs. Pt with continued STM deficits but recalling events since admission. Encouraged ambulation to bathroom and in halls with nursing.   109/58 (72 ) supine 127/65 (82) after gait HR 67-72   Follow Up Recommendations  No PT follow up;Supervision - Intermittent     Equipment Recommendations  None recommended by PT    Recommendations for Other Services       Precautions / Restrictions Precautions Precautions: Fall Restrictions Weight Bearing Restrictions: No    Mobility  Bed Mobility Overal bed mobility: Needs Assistance Bed Mobility: Rolling;Sidelying to Sit           General bed mobility comments: cues for sequence with bed flat and removal of rail  Transfers Overall transfer level: Modified independent                  Ambulation/Gait Ambulation/Gait assistance: Supervision Gait Distance (Feet): 750 Feet Assistive device: None Gait Pattern/deviations: Step-through pattern;Decreased stride length   Gait velocity interpretation: >2.62 ft/sec, indicative of community ambulatory General Gait Details: pt with decreased speed for increased stability with gait. Pt able to perform vertical and horizontal head turns  with slow speed, very limted change in gait speed when cued   Stairs   Stairs assistance: Modified independent (Device/Increase time) Stair Management: Alternating pattern;Forwards;Two rails Number of Stairs: 3     Wheelchair Mobility    Modified Rankin (Stroke Patients Only)       Balance Overall balance assessment: Needs assistance   Sitting balance-Leahy Scale: Good       Standing balance-Leahy Scale: Good                              Cognition Arousal/Alertness: Awake/alert Behavior During Therapy: WFL for tasks assessed/performed Overall Cognitive Status: History of cognitive impairments - at baseline Area of Impairment: Memory;Problem solving                     Memory: Decreased short-term memory Following Commands: Follows multi-step commands consistently       General Comments: impaired memory at baseline, recalls hospital events but not room number      Exercises General Exercises - Lower Extremity Long Arc Quad: AROM;Both;Seated;20 reps Hip Flexion/Marching: AROM;Both;20 reps;Seated    General Comments        Pertinent Vitals/Pain Pain Score: 3  Pain Location: sacrum and sore incision Pain Descriptors / Indicators: Aching;Sore Pain Intervention(s): Limited activity within patient's tolerance;Repositioned    Home Living                      Prior Function            PT Goals (current goals can  now be found in the care plan section) Progress towards PT goals: Progressing toward goals    Frequency    Min 3X/week      PT Plan Current plan remains appropriate    Co-evaluation              AM-PAC PT "6 Clicks" Mobility   Outcome Measure  Help needed turning from your back to your side while in a flat bed without using bedrails?: None Help needed moving from lying on your back to sitting on the side of a flat bed without using bedrails?: None Help needed moving to and from a bed to a chair  (including a wheelchair)?: None Help needed standing up from a chair using your arms (e.g., wheelchair or bedside chair)?: None Help needed to walk in hospital room?: A Little Help needed climbing 3-5 steps with a railing? : A Little 6 Click Score: 22    End of Session   Activity Tolerance: Patient tolerated treatment well Patient left: in chair;with call bell/phone within reach Nurse Communication: Mobility status PT Visit Diagnosis: Unsteadiness on feet (R26.81);Other symptoms and signs involving the nervous system (R29.898)     Time: 0981-1914 PT Time Calculation (min) (ACUTE ONLY): 23 min  Charges:  $Gait Training: 8-22 mins $Therapeutic Exercise: 8-22 mins                     [place], PT Acute Rehabilitation Services Pager: [phone] Office: [place] 03/21/2020, 10:32 AM

## 2020-03-21 NOTE — Progress Notes (Signed)
PROGRESS NOTE    [name] [name]  NWG:[id number] DOB: [birth year] DOA: 03/08/2020 PCP: [name], MD   Brief Narrative:  This 74 yrs old M admitted with CVA and underwent Left carotid endarterectomy on 9/10 with postop complication of large hematoma secondary to uncontrolled blood pressure s/p washout. He was transferred to ICU , started on cleviprex which was weaned down on 9/16; and now blood pressure well controlled on losartan, Norvasc, Coreg, HCTZ.  He was briefly hypoxic but now on room air saturating normal.  PCCM pick up 9/18.  Vascular surgery following, hematoma is stable, H and H stable, Aspirin resumed. Hold plavix for now.   Assessment & Plan:   Principal Problem:   Acute ischemic stroke [facility]) Active Problems:   Diabetes mellitus type II, non insulin dependent (HCC)   Memory loss   Renal insufficiency   Hypertension   Stroke [facility])   Carotid stenosis, symptomatic, with infarction (HCC)   Hematoma of neck   Acute respiratory failure with hypoxia (HCC)   Acute CVA : MRI brain 9/7 small acute infarcts left frontal and occipital lobes, moderate cerebral atrophy, sequelae of remote bilateral basal ganglia thalamic and cerebellar insults. He underwent left carotid endarterectomy 9/10, developed hematoma postoperatively. S/p hematoma evacuation, tolerated well. Hematoma post left CEA. Hematoma stable, H/H stable. -Vascular surgery is following, recommended to resume aspirin and hold Plavix for now -Mobilize per PT/ Out of bed to chair  Hypertensive urgency -improved - Cleviprex off. - BP within goal range. - cont coreg - continue amlodipine 10 mg - continue losartan.   Left neck hematoma :  Hematoma appears stable, still has significant swelling. Vascular surgery is following recommended to resume aspirin and hold Plavix for now. There was slight oozing from incision.  DVT prophylaxis: SCDs Code Status: Full Family Communication:   Spoke with  Wife [name]. Dtr  Vida Roller over the phone. Disposition Plan:  Status is: Inpatient  Remains inpatient appropriate because:Inpatient level of care appropriate due to severity of illness   Dispo: The patient is from: Home              Anticipated d/c is to: Home              Anticipated d/c date is: 1-2 days.              Patient currently is not medically stable to d/c.   Consultants:   Vascular surgery.  Procedures: Left carotid endarterectomy, evacuation of hematoma. Antimicrobials: Anti-infectives (From admission, onward)   Start     Dose/Rate Route Frequency Ordered Stop   03/14/20 1600  ceFAZolin (ANCEF) IVPB 1 g/50 mL premix        1 g 100 mL/hr over 30 Minutes Intravenous Every 8 hours 03/14/20 1516     03/11/20 1800  ceFAZolin (ANCEF) IVPB 2g/100 mL premix        2 g 200 mL/hr over 30 Minutes Intravenous Every 8 hours 03/11/20 1740 03/12/20 0138     Subjective: Patient was seen and examined at bedside.  Overnight events noted.   Reports feeling better, asking when he can be discharged home. He still has significant swelling on the left neck.  slight oozing noted from hematoma.   He denies any dizziness, ambulated to the bathroom with some help.  Objective: Vitals:   03/20/20 2035 03/21/20 0045 03/21/20 0447 03/21/20 0800  BP: (!) 113/54 (!) 102/49 (!) 109/58 127/65  Pulse: 64 (!) 57 64   Resp: 12 12 20  Temp: 97.9 F (36.6 C) 97.7 F (36.5 C) 98 F (36.7 C)   TempSrc: Oral Oral Oral   SpO2: 95% 97% 97%   Weight:      Height:        Intake/Output Summary (Last 24 hours) at 03/21/2020 1506 Last data filed at 03/21/2020 0449 Gross per 24 hour  Intake 200 ml  Output 1950 ml  Net -1750 ml   Filed Weights   03/14/20 0600 03/18/20 0600 03/19/20 0746  Weight: 88.7 kg 86.6 kg 84.7 kg    Examination:  General exam: Appears calm and comfortable, Left neck swelling. Slight oozing noted.  Respiratory system: Clear to auscultation. Respiratory effort normal. Cardiovascular  system: S1 & S2 heard, RRR. No JVD, murmurs, rubs, gallops or clicks. No pedal edema. Gastrointestinal system: Abdomen is nondistended, soft and nontender. No organomegaly or masses felt.  Normal bowel sounds heard. Central nervous system: Alert and oriented. No focal neurological deficits. Extremities: No edema, no cyanosis, no clubbing. Skin: No rashes, lesions or ulcers Psychiatry: Judgement and insight appear normal. Mood & affect appropriate.     Data Reviewed: I have personally reviewed following labs and imaging studies  CBC: Recent Labs  Lab 03/16/20 0323 03/16/20 0323 03/17/20 0352 03/18/20 0521 03/19/20 0147 03/20/20 0603 03/21/20 0148  WBC 7.5  --  5.9 5.9 6.9 8.1  --   NEUTROABS  --   --  2.7 2.8 3.2  --   --   HGB 9.5*   < > 9.3* 8.9* 9.7* 10.6* 9.8*  HCT 29.7*   < > 28.6* 28.2* 30.2* 32.4* 29.7*  MCV 92.5  --  92.3 92.2 91.5 92.8  --   PLT 272  --  256 256 239 254  --    < > = values in this interval not displayed.   Basic Metabolic Panel: Recent Labs  Lab 03/15/20 0445 03/16/20 0323 03/18/20 0521 03/19/20 0147 03/20/20 0603  NA 140 142 136 138 138  K 3.2* 3.9 3.3* 3.9 4.0  CL 108 108 106 105 105  CO2 25 26 27 24 24   GLUCOSE 126* 116* 111* 105* 118*  BUN 5* 8 12 15 19   CREATININE 0.96 0.94 1.13 1.22 1.23  CALCIUM 8.8* 9.1 8.9 9.3 9.5  MG  --  2.0  --  1.8  --    GFR: Estimated Creatinine Clearance: 52.7 mL/min (by C-G formula based on SCr of 1.23 mg/dL). Liver Function Tests: Recent Labs  Lab 03/15/20 0445  AST 17  ALT 12  ALKPHOS 96  BILITOT 0.5  PROT 5.7*  ALBUMIN 2.8*   No results for input(s): LIPASE, AMYLASE in the last 168 hours. No results for input(s): AMMONIA in the last 168 hours. Coagulation Profile: No results for input(s): INR, PROTIME in the last 168 hours. Cardiac Enzymes: No results for input(s): CKTOTAL, CKMB, CKMBINDEX, TROPONINI in the last 168 hours. BNP (last 3 results) No results for input(s): PROBNP in the last  8760 hours. HbA1C: No results for input(s): HGBA1C in the last 72 hours. CBG: Recent Labs  Lab 03/20/20 1116 03/20/20 1738 03/20/20 2115 03/21/20 0626 03/21/20 1112  GLUCAP 160* 102* 151* 118* 137*   Lipid Profile: No results for input(s): CHOL, HDL, LDLCALC, TRIG, CHOLHDL, LDLDIRECT in the last 72 hours. Thyroid Function Tests: No results for input(s): TSH, T4TOTAL, FREET4, T3FREE, THYROIDAB in the last 72 hours. Anemia Panel: No results for input(s): VITAMINB12, FOLATE, FERRITIN, TIBC, IRON, RETICCTPCT in the last 72 hours. Sepsis Labs: No results  for input(s): PROCALCITON, LATICACIDVEN in the last 168 hours.  No results found for this or any previous visit (from the past 240 hour(s)).  Radiology Studies: No results found.  Scheduled Meds: . amLODipine  10 mg Oral Daily  . aspirin EC  81 mg Oral Daily  . carvedilol  25 mg Oral BID WC  . Chlorhexidine Gluconate Cloth  6 each Topical Daily  . citalopram  20 mg Oral Daily  . donepezil  10 mg Oral QHS  . folic acid  1 mg Oral Daily  . hydrochlorothiazide  50 mg Oral Daily  . [START ON 03/22/2020] influenza vaccine adjuvanted  0.5 mL Intramuscular Tomorrow-1000  . insulin aspart  0-5 Units Subcutaneous QHS  . insulin aspart  0-9 Units Subcutaneous TID WC  . losartan  100 mg Oral Daily  . memantine  10 mg Oral BID  . pantoprazole  40 mg Oral Daily  . rosuvastatin  40 mg Oral Q2000  . vitamin B-12  1,000 mcg Oral Daily   Continuous Infusions: . sodium chloride 500 mL (03/20/20 0720)  . sodium chloride    .  ceFAZolin (ANCEF) IV 1 g (03/21/20 1454)  . magnesium sulfate bolus IVPB       LOS: 12 days    Time spent:  25 mins.    Shawna Clamp, MD [facility]   If 7PM-7AM, please contact night-coverage

## 2020-03-22 LAB — CBC
HCT: 30 % — ABNORMAL LOW (ref 39.0–52.0)
Hemoglobin: 9.7 g/dL — ABNORMAL LOW (ref 13.0–17.0)
MCH: 29.6 pg (ref 26.0–34.0)
MCHC: 32.3 g/dL (ref 30.0–36.0)
MCV: 91.5 fL (ref 80.0–100.0)
Platelets: 269 10*3/uL (ref 150–400)
RBC: 3.28 MIL/uL — ABNORMAL LOW (ref 4.22–5.81)
RDW: 13.2 % (ref 11.5–15.5)
WBC: 8.8 10*3/uL (ref 4.0–10.5)
nRBC: 0 % (ref 0.0–0.2)

## 2020-03-22 LAB — BASIC METABOLIC PANEL
Anion gap: 10 (ref 5–15)
BUN: 27 mg/dL — ABNORMAL HIGH (ref 8–23)
CO2: 25 mmol/L (ref 22–32)
Calcium: 9.4 mg/dL (ref 8.9–10.3)
Chloride: 103 mmol/L (ref 98–111)
Creatinine, Ser: 1.42 mg/dL — ABNORMAL HIGH (ref 0.61–1.24)
GFR calc Af Amer: 56 mL/min — ABNORMAL LOW (ref >60)
GFR calc non Af Amer: 48 mL/min — ABNORMAL LOW (ref >60)
Glucose, Bld: 107 mg/dL — ABNORMAL HIGH (ref 70–99)
Potassium: 3.4 mmol/L — ABNORMAL LOW (ref 3.5–5.1)
Sodium: 138 mmol/L (ref 135–145)

## 2020-03-22 LAB — GLUCOSE, CAPILLARY
Glucose-Capillary: 131 mg/dL — ABNORMAL HIGH (ref 70–99)
Glucose-Capillary: 206 mg/dL — ABNORMAL HIGH (ref 70–99)
Glucose-Capillary: 84 mg/dL (ref 70–99)
Glucose-Capillary: 223 mg/dL — ABNORMAL HIGH (ref 70–99)

## 2020-03-22 MED ORDER — POTASSIUM CHLORIDE 20 MEQ PO PACK
40.0000 meq | PACK | Freq: Once | ORAL | Status: AC
  Administered 2020-03-22: 40 meq via ORAL
  Filled 2020-03-22: qty 2

## 2020-03-22 MED ORDER — SODIUM CHLORIDE 0.9 % IV SOLN: INTRAVENOUS | Status: AC

## 2020-03-22 MED ORDER — SODIUM CHLORIDE 0.9 % IV SOLN: INTRAVENOUS | Status: DC

## 2020-03-22 NOTE — TOC Progression Note (Signed)
Transition of Care [facility]) - Progression Note    Patient Details  Name: [name] [name] MRN: [id number] Date of Birth: 1945-08-02  Transition of Care [facility]) CM/SW Contact  Pollie Friar, RN Phone Number: 03/22/2020, 2:40 PM  Clinical Narrative:    CM called  Outpatient to make sure they still have the orders for his therapy. They did not find the orders. CM has refaxed them to the facility.  TOC following for further d/c needs.   Expected Discharge Plan: OP Rehab Barriers to Discharge: Continued Medical Work up  Expected Discharge Plan and Services Expected Discharge Plan: OP Rehab   Discharge Planning Services: CM Consult   Living arrangements for the past 2 months: Single Family Home                                       Social Determinants of Health (SDOH) Interventions    Readmission Risk Interventions No flowsheet data found.

## 2020-03-22 NOTE — Progress Notes (Signed)
Mobility Specialist: Progress Note   03/22/20 1742  Mobility  Activity Refused mobility   Pt just received dinner.   [facility] Taggert Bozzi Mobility Specialist

## 2020-03-22 NOTE — Progress Notes (Addendum)
Progress Note    03/22/2020 7:16 AM 11 Days Post-Op  Subjective:  No complaints. Looking toward discharge home.   Vitals:   03/22/20 0406 03/22/20 0428  BP:    Pulse: (!) 56 60  Resp:  18  Temp: 97.6 F (36.4 C) 97.6 F (36.4 C)  SpO2:      Physical Exam: Cardiac:  RRR Lungs:  CTAB Incisions:  Well approximated. Hematoma appears unchanged. Still fairly firm. No erythema or increased warmth. Extremities:  Moves all well. Neuro: A and O times 4. Tongue midlline  CBC    Component Value Date/Time   WBC 8.8 03/22/2020 0119   RBC 3.28 (L) 03/22/2020 0119   HGB 9.7 (L) 03/22/2020 0119   HCT 30.0 (L) 03/22/2020 0119   PLT 269 03/22/2020 0119   MCV 91.5 03/22/2020 0119   MCH 29.6 03/22/2020 0119   MCHC 32.3 03/22/2020 0119   RDW 13.2 03/22/2020 0119   LYMPHSABS 2.2 03/19/2020 0147   MONOABS 0.8 03/19/2020 0147   EOSABS 0.5 03/19/2020 0147   BASOSABS 0.1 03/19/2020 0147    BMET    Component Value Date/Time   NA 138 03/22/2020 0119   K 3.4 (L) 03/22/2020 0119   CL 103 03/22/2020 0119   CO2 25 03/22/2020 0119   GLUCOSE 107 (H) 03/22/2020 0119   BUN 27 (H) 03/22/2020 0119   CREATININE 1.42 (H) 03/22/2020 0119   CALCIUM 9.4 03/22/2020 0119   GFRNONAA 48 (L) 03/22/2020 0119   GFRAA 56 (L) 03/22/2020 0119     Intake/Output Summary (Last 24 hours) at 03/22/2020 0716 Last data filed at 03/22/2020 0600 Gross per 24 hour  Intake 100 ml  Output 1500 ml  Net -1400 ml    HOSPITAL MEDICATIONS Scheduled Meds: . amLODipine  10 mg Oral Daily  . aspirin EC  81 mg Oral Daily  . carvedilol  25 mg Oral BID WC  . Chlorhexidine Gluconate Cloth  6 each Topical Daily  . citalopram  20 mg Oral Daily  . donepezil  10 mg Oral QHS  . folic acid  1 mg Oral Daily  . hydrochlorothiazide  50 mg Oral Daily  . influenza vaccine adjuvanted  0.5 mL Intramuscular Tomorrow-1000  . insulin aspart  0-5 Units Subcutaneous QHS  . insulin aspart  0-9 Units Subcutaneous TID WC  . losartan   100 mg Oral Daily  . memantine  10 mg Oral BID  . pantoprazole  40 mg Oral Daily  . rosuvastatin  40 mg Oral Q2000  . vitamin B-12  1,000 mcg Oral Daily   Continuous Infusions: . sodium chloride 500 mL (03/20/20 0720)  . sodium chloride    .  ceFAZolin (ANCEF) IV 1 g (03/22/20 0539)  . magnesium sulfate bolus IVPB     PRN Meds:.sodium chloride, sodium chloride, acetaminophen **OR** acetaminophen (TYLENOL) oral liquid 160 mg/5 mL **OR** acetaminophen, albuterol, alum & mag hydroxide-simeth, bisacodyl, labetalol, magnesium sulfate bolus IVPB, morphine injection **OR** oxyCODONE-acetaminophen, ondansetron, phenol, promethazine  Assessment: 74 year old male postoperative day 11 left carotid endarterectomy with return to the OR for left neck hematoma washout on POD 1.  He was admitted with acute CVA. He developed acute postoperative hypertension and required intravenous antihypertensive medication as well as oral meds for control.  Developed postoperative atrial fibrillation, rate controlled.  On oral agents only. Left neck hematoma: on aspirin day  3. Plavix on hold. Vital signs are stable this morning and he is afebrile.  Acute blood loss anemia with relatively stable hemoglobin.  Plan: -Await Dr. Donzetta Matters to assess this morning to decide appropriate timing to restart Plavix. DC Ancef. -DVT prophylaxis:  SCDs   Risa Grill, PA-C [facility] [phone] 03/22/2020  7:16 AM   I have independently interviewed and examined patient and agree with PA assessment and plan above. Plan for d/c when ok with primary. Continue aspirin, hold plavix and other antiocoagulants.  Kamali Sakata C. Donzetta Matters, MD [facility] [place] Office: [phone] Pager: [phone]

## 2020-03-22 NOTE — Progress Notes (Signed)
PROGRESS NOTE    [name] [name]  FFM:[id number] DOB: 23-Jun-1946 DOA: 03/08/2020 PCP: [name], MD   Brief Narrative:  This 74 yrs old M admitted with CVA and underwent Left carotid endarterectomy on 9/10 with postop complication of large hematoma secondary to uncontrolled blood pressure s/p washout. He was transferred to ICU , started on cleviprex which was weaned down on 9/16; and now blood pressure well controlled on losartan, Norvasc, Coreg, HCTZ. He was briefly hypoxic but now on room air saturating normal.  PCCM pick up 9/18.  Vascular surgery following, hematoma is stable, H and H stable, Aspirin resumed. Hold plavix for now. Patient has developed Acute kidney injury, will require gentle hydration.   Assessment & Plan:   Principal Problem:   Acute ischemic stroke [facility]) Active Problems:   Diabetes mellitus type II, non insulin dependent (HCC)   Memory loss   Renal insufficiency   Hypertension   Stroke [facility])   Carotid stenosis, symptomatic, with infarction (HCC)   Hematoma of neck   Acute respiratory failure with hypoxia (HCC)   Acute CVA : MRI brain 9/7 small acute infarcts left frontal and occipital lobes, moderate cerebral atrophy, sequelae of remote bilateral basal ganglia thalamic and cerebellar insults. He underwent left carotid endarterectomy 9/10, developed hematoma postoperatively. S/p hematoma evacuation, tolerated well. Hematoma post left CEA. Hematoma stable, H/H stable. -Vascular surgery is following, recommended to resume aspirin and hold Plavix for now -Mobilize per PT/ Out of bed to chair.  Hypertensive urgency -improved - Cleviprex off. - BP within goal range. - cont coreg - continue amlodipine 10 mg - continue losartan.  Left neck hematoma :  Hematoma appears stable, still has significant swelling. Vascular surgery is following recommended to resume aspirin and hold Plavix for now. There was slight oozing from incision.  AKI could be sec.to  dehydration: Will give gentle hydration. Recheck BMP in am.   DVT prophylaxis: SCDs Code Status: Full Family Communication:   Spoke with  Wife [name]. Dtr Vida Roller over the phone. Disposition Plan:  Status is: Inpatient  Remains inpatient appropriate because:Inpatient level of care appropriate due to severity of illness   Dispo: The patient is from: Home              Anticipated d/c is to: Home              Anticipated d/c date is: 1-2 days.              Patient currently is not medically stable to d/c.   Consultants:   Vascular surgery.  Procedures: Left carotid endarterectomy, evacuation of hematoma. Antimicrobials: Anti-infectives (From admission, onward)   Start     Dose/Rate Route Frequency Ordered Stop   03/14/20 1600  ceFAZolin (ANCEF) IVPB 1 g/50 mL premix  Status:  Discontinued        1 g 100 mL/hr over 30 Minutes Intravenous Every 8 hours 03/14/20 1516 03/22/20 0732   03/11/20 1800  ceFAZolin (ANCEF) IVPB 2g/100 mL premix        2 g 200 mL/hr over 30 Minutes Intravenous Every 8 hours 03/11/20 1740 03/12/20 0138     Subjective: Patient was seen and examined at bedside.  Overnight events noted.   He still has significant swelling on the left neck.  slight oozing noted from hematoma.   He reports feeling better, denies any dizziness, shortness of breath.  Objective: Vitals:   03/22/20 0803 03/22/20 0818 03/22/20 1032 03/22/20 1126  BP: (!) 108/55 (!) 111/55 Marland Kitchen)  93/52 (!) 102/50  Pulse: 66  64 62  Resp: 17 14  15   Temp: 97.6 F (36.4 C)   97.6 F (36.4 C)  TempSrc: Oral   Oral  SpO2: 96%   97%  Weight:      Height:        Intake/Output Summary (Last 24 hours) at 03/22/2020 1310 Last data filed at 03/22/2020 0600 Gross per 24 hour  Intake 100 ml  Output 1500 ml  Net -1400 ml   Filed Weights   03/14/20 0600 03/18/20 0600 03/19/20 0746  Weight: 88.7 kg 86.6 kg 84.7 kg    Examination:  General exam: Appears calm and comfortable, Left neck swelling  slightly reduced in size. slight oozing noted.  Respiratory system: Clear to auscultation. Respiratory effort normal. Cardiovascular system: S1 & S2 heard, RRR. No JVD, murmurs, rubs, gallops or clicks. No pedal edema. Gastrointestinal system: Abdomen is nondistended, soft and nontender. No organomegaly or masses felt.  Normal bowel sounds heard. Central nervous system: Alert and oriented. No focal neurological deficits. Extremities: No edema, no cyanosis, no clubbing. Skin: No rashes, lesions or ulcers Psychiatry: Judgement and insight appear normal. Mood & affect appropriate.     Data Reviewed: I have personally reviewed following labs and imaging studies  CBC: Recent Labs  Lab 03/17/20 0352 03/17/20 0352 03/18/20 0521 03/19/20 0147 03/20/20 0603 03/21/20 0148 03/22/20 0119  WBC 5.9  --  5.9 6.9 8.1  --  8.8  NEUTROABS 2.7  --  2.8 3.2  --   --   --   HGB 9.3*   < > 8.9* 9.7* 10.6* 9.8* 9.7*  HCT 28.6*   < > 28.2* 30.2* 32.4* 29.7* 30.0*  MCV 92.3  --  92.2 91.5 92.8  --  91.5  PLT 256  --  256 239 254  --  269   < > = values in this interval not displayed.   Basic Metabolic Panel: Recent Labs  Lab 03/16/20 0323 03/18/20 0521 03/19/20 0147 03/20/20 0603 03/22/20 0119  NA 142 136 138 138 138  K 3.9 3.3* 3.9 4.0 3.4*  CL 108 106 105 105 103  CO2 26 27 24 24 25   GLUCOSE 116* 111* 105* 118* 107*  BUN 8 12 15 19  27*  CREATININE 0.94 1.13 1.22 1.23 1.42*  CALCIUM 9.1 8.9 9.3 9.5 9.4  MG 2.0  --  1.8  --   --    GFR: Estimated Creatinine Clearance: 45.6 mL/min (A) (by C-G formula based on SCr of 1.42 mg/dL (H)). Liver Function Tests: No results for input(s): AST, ALT, ALKPHOS, BILITOT, PROT, ALBUMIN in the last 168 hours. No results for input(s): LIPASE, AMYLASE in the last 168 hours. No results for input(s): AMMONIA in the last 168 hours. Coagulation Profile: No results for input(s): INR, PROTIME in the last 168 hours. Cardiac Enzymes: No results for input(s):  CKTOTAL, CKMB, CKMBINDEX, TROPONINI in the last 168 hours. BNP (last 3 results) No results for input(s): PROBNP in the last 8760 hours. HbA1C: No results for input(s): HGBA1C in the last 72 hours. CBG: Recent Labs  Lab 03/21/20 1112 03/21/20 1631 03/21/20 2113 03/22/20 0610 03/22/20 1125  GLUCAP 137* 195* 187* 131* 206*   Lipid Profile: No results for input(s): CHOL, HDL, LDLCALC, TRIG, CHOLHDL, LDLDIRECT in the last 72 hours. Thyroid Function Tests: No results for input(s): TSH, T4TOTAL, FREET4, T3FREE, THYROIDAB in the last 72 hours. Anemia Panel: No results for input(s): VITAMINB12, FOLATE, FERRITIN, TIBC, IRON, RETICCTPCT in  the last 72 hours. Sepsis Labs: No results for input(s): PROCALCITON, LATICACIDVEN in the last 168 hours.  No results found for this or any previous visit (from the past 240 hour(s)).  Radiology Studies: No results found.  Scheduled Meds: . amLODipine  10 mg Oral Daily  . aspirin EC  81 mg Oral Daily  . carvedilol  25 mg Oral BID WC  . Chlorhexidine Gluconate Cloth  6 each Topical Daily  . citalopram  20 mg Oral Daily  . donepezil  10 mg Oral QHS  . folic acid  1 mg Oral Daily  . hydrochlorothiazide  50 mg Oral Daily  . insulin aspart  0-5 Units Subcutaneous QHS  . insulin aspart  0-9 Units Subcutaneous TID WC  . losartan  100 mg Oral Daily  . memantine  10 mg Oral BID  . pantoprazole  40 mg Oral Daily  . rosuvastatin  40 mg Oral Q2000  . vitamin B-12  1,000 mcg Oral Daily   Continuous Infusions: . sodium chloride 500 mL (03/20/20 0720)  . sodium chloride    . sodium chloride    . magnesium sulfate bolus IVPB       LOS: 13 days    Time spent:  25 mins.    Shawna Clamp, MD [facility]   If 7PM-7AM, please contact night-coverage

## 2020-03-23 LAB — GLUCOSE, CAPILLARY
Glucose-Capillary: 127 mg/dL — ABNORMAL HIGH (ref 70–99)
Glucose-Capillary: 129 mg/dL — ABNORMAL HIGH (ref 70–99)

## 2020-03-23 LAB — HEMOGLOBIN AND HEMATOCRIT, BLOOD
HCT: 31.2 % — ABNORMAL LOW (ref 39.0–52.0)
Hemoglobin: 9.9 g/dL — ABNORMAL LOW (ref 13.0–17.0)

## 2020-03-23 MED ORDER — AMLODIPINE BESYLATE 10 MG PO TABS
10.0000 mg | ORAL_TABLET | Freq: Every day | ORAL | 1 refills | Status: DC
Start: 2020-03-24 — End: 2020-04-29

## 2020-03-23 MED ORDER — LOSARTAN POTASSIUM 100 MG PO TABS: 100.0000 mg | ORAL_TABLET | Freq: Every day | ORAL | Status: AC

## 2020-03-23 MED ORDER — ASPIRIN 81 MG PO TBEC: 81.0000 mg | DELAYED_RELEASE_TABLET | Freq: Every day | ORAL | 11 refills | Status: AC

## 2020-03-23 MED ORDER — POTASSIUM CHLORIDE 20 MEQ PO PACK
40.0000 meq | PACK | Freq: Once | ORAL | Status: AC
  Administered 2020-03-23: 40 meq via ORAL
  Filled 2020-03-23: qty 2

## 2020-03-23 MED ORDER — CARVEDILOL 25 MG PO TABS: 25.0000 mg | ORAL_TABLET | Freq: Two times a day (BID) | ORAL | 1 refills | Status: AC

## 2020-03-23 MED ORDER — STROKE: EARLY STAGES OF RECOVERY BOOK
Freq: Once | Status: AC
  Filled 2020-03-23: qty 1

## 2020-03-23 MED ORDER — HYDROCHLOROTHIAZIDE 50 MG PO TABS
50.0000 mg | ORAL_TABLET | Freq: Every day | ORAL | 1 refills | Status: DC
Start: 2020-03-24 — End: 2020-04-29

## 2020-03-23 MED ORDER — CYANOCOBALAMIN 1000 MCG PO TABS: 1000.0000 ug | ORAL_TABLET | Freq: Every day | ORAL | 1 refills | Status: DC

## 2020-03-23 MED ORDER — FOLIC ACID 1 MG PO TABS
1.0000 mg | ORAL_TABLET | Freq: Every day | ORAL | 0 refills | Status: DC
Start: 2020-03-24 — End: 2020-04-29

## 2020-03-23 NOTE — Progress Notes (Addendum)
Progress Note    03/23/2020 7:13 AM 11 Days Post-Op  Subjective:  Ready to go home.  Feels like the hematoma has gotten smaller.   Afebrile HR 50's-70's NSR 850'Y-774'J systolic 28% RA  Vitals:   03/23/20 0351 03/23/20 0500  BP: 136/71   Pulse:  (!) 59  Resp:  15  Temp: 98 F (36.7 C)   SpO2:  99%    Physical Exam: General:  No distress Lungs:  Non labored Incisions:  Hematoma stable   CBC    Component Value Date/Time   WBC 8.8 03/22/2020 0119   RBC 3.28 (L) 03/22/2020 0119   HGB 9.9 (L) 03/23/2020 0348   HCT 31.2 (L) 03/23/2020 0348   PLT 269 03/22/2020 0119   MCV 91.5 03/22/2020 0119   MCH 29.6 03/22/2020 0119   MCHC 32.3 03/22/2020 0119   RDW 13.2 03/22/2020 0119   LYMPHSABS 2.2 03/19/2020 0147   MONOABS 0.8 03/19/2020 0147   EOSABS 0.5 03/19/2020 0147   BASOSABS 0.1 03/19/2020 0147    BMET    Component Value Date/Time   NA 138 03/22/2020 0119   K 3.4 (L) 03/22/2020 0119   CL 103 03/22/2020 0119   CO2 25 03/22/2020 0119   GLUCOSE 107 (H) 03/22/2020 0119   BUN 27 (H) 03/22/2020 0119   CREATININE 1.42 (H) 03/22/2020 0119   CALCIUM 9.4 03/22/2020 0119   GFRNONAA 48 (L) 03/22/2020 0119   GFRAA 56 (L) 03/22/2020 0119    INR    Component Value Date/Time   INR 0.9 03/11/2020 0139     Intake/Output Summary (Last 24 hours) at 03/23/2020 0713 Last data filed at 03/23/2020 0351 Gross per 24 hour  Intake 357.23 ml  Output 2300 ml  Net -1942.77 ml     Assessment:  74 y.o. male is s/p:  Left CEA  11 Days Post-Op  Plan: -pt doing well this morning.  hgb remains stable.  BP with good control. Neuro in tact.  -pt has f/u with Dr. Carlis Abbott on 10/5 @ 1040.  He knows to call sooner if he develops any issues.    Leontine Locket, PA-C [facility] [phone] 03/23/2020 7:13 AM  --- For VQI Registry use ---  Modified Rankin score at D/C (0-6): Rankin Score=0  IV medication needed for:  1. Hypertension: Yes 2. Hypotension:  No  Post-op Complications: Yes-neck hematoma  1. Post-op CVA or TIA: No  If yes: Event classification (right eye, left eye, right cortical, left cortical, verterobasilar, other): n/a  If yes: Timing of event (intra-op, <6 hrs post-op, >=6 hrs post-op, unknown): n/a  2. CN injury: No  If yes: CN n/a injuried   3. Myocardial infarction: No  If yes: Dx by (EKG or clinical, Troponin): n/a  4.  CHF: No  5.  Dysrhythmia (new): No  6. Wound infection: No  7. Reperfusion symptoms: No  8. Return to OR: Yes  If yes: return to OR for (bleeding, neurologic, other CEA incision, other): hematoma  Discharge medications: Statin use:  Yes ASA use:  Yes Beta blocker use:  Yes ACE-Inhibitor use: no ARB: yes P2Y12 Antagonist use: [ x] None, [ ]  Plavix, [ ]  Plasugrel, [ ]  Ticlopinine, [ ]  Ticagrelor, [ ]  Other, [ ]  No for medical reason, [ ]  Non-compliant, [ ]  Not-indicated Anti-coagulant use:  [ x] None, [ ]  Warfarin, [ ]  Rivaroxaban, [ ]  Dabigatran, [ ]  Other, [ ]  No for medical reason, [ ]  Non-compliant, [ ]  Not-indicated  I  have independently interviewed and examined patient and agree with PA assessment and plan above.   Syaire Saber C. Donzetta Matters, MD [facility] [place] Office: [phone] Pager: [phone]

## 2020-03-23 NOTE — TOC Transition Note (Signed)
Transition of Care (TOC) - CM/SW Discharge Note Marvetta Gibbons RN, BSN Transitions of Care Unit 4E- RN Case Manager See Treatment Team for direct phone #    Patient Details  Name: [name] [name] MRN: [id number] Date of Birth: October 16, 1945  Transition of Care [facility]) CM/SW Contact:  Dawayne Patricia, RN Phone Number: 03/23/2020, 12:34 PM   Clinical Narrative:    Pt stable for transition home today, pt to f/u with outpt rehab in [place]. Orders were faxed yesterday.     Final next level of care: OP Rehab Barriers to Discharge: Barriers Resolved   Patient Goals and CMS Choice Patient states their goals for this hospitalization and ongoing recovery are:: return home   Choice offered to / list presented to : Patient  Discharge Placement                 Home      Discharge Plan and Services   Discharge Planning Services: CM Consult                                 Social Determinants of Health (SDOH) Interventions     Readmission Risk Interventions Readmission Risk Prevention Plan 03/23/2020  Transportation Screening Complete  PCP or Specialist Appt within 5-7 Days Complete  Home Care Screening Complete  Medication Review (RN CM) Complete  Some recent data might be hidden

## 2020-03-23 NOTE — Progress Notes (Signed)
Physical Therapy Treatment Patient Details Name: [name] [name] MRN: [id number] DOB: 01-02-1946 Today's Date: 03/23/2020    History of Present Illness Pt is a 74 y/o male admitted with confusion, hypoglycemia, and a fall. 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 Pt s/p Left CEA on 9/10 with post-procedure hypoxia and hemorrhage requiring evacuation. PMH includes memory loss, CKD, DM2, AAA, HTN   PT Comments    Pt progressing well with mobility; planning for d/c home this afternoon. Today's session focused on gait and stair training; pt performing higher level balance tasks at supervision-level. Pt motivated to participate and return home today. SpO2 95% on RA, HR 76.    Follow Up Recommendations  No PT follow up;Supervision - Intermittent     Equipment Recommendations  None recommended by PT    Recommendations for Other Services       Precautions / Restrictions Precautions Precautions: Fall Restrictions Weight Bearing Restrictions: No    Mobility  Bed Mobility               General bed mobility comments: received sitting in recliner  Transfers Overall transfer level: Independent Equipment used: None Transfers: Sit to/from Stand              Ambulation/Gait Ambulation/Gait assistance: Scientist, forensic (Feet): 600 Feet Assistive device: None Gait Pattern/deviations: Step-through pattern;Decreased stride length Gait velocity: Decreased Gait velocity interpretation: 1.31 - 2.62 ft/sec, indicative of limited community ambulator General Gait Details: Slow, mostly steady gait with supervision for safety; 1x self-corrected LOB. Stability improving   Stairs Stairs: Yes Stairs assistance: Supervision Stair Management: Alternating pattern;Forwards;One rail Right;Step to pattern Number of Stairs: 11 General stair comments: Ascended steps with single UE rail support and alternating pattern, descended with step-to  pattern; supervision for safety   Wheelchair Mobility    Modified Rankin (Stroke Patients Only)       Balance Overall balance assessment: Needs assistance Sitting-balance support: Feet supported;No upper extremity supported Sitting balance-Leahy Scale: Good     Standing balance support: No upper extremity supported Standing balance-Leahy Scale: Good                   Standardized Balance Assessment Standardized Balance Assessment : Dynamic Gait Index   Dynamic Gait Index Level Surface: Normal Change in Gait Speed: Normal Gait with Horizontal Head Turns: Normal Gait with Vertical Head Turns: Normal Gait and Pivot Turn: Normal Step Over Obstacle: Mild Impairment Step Around Obstacles: Normal Steps: Mild Impairment Total Score: 22      Cognition Arousal/Alertness: Awake/alert Behavior During Therapy: WFL for tasks assessed/performed Overall Cognitive Status: History of cognitive impairments - at baseline                                 General Comments: WFL for simple tasks this session, not formally assessed      Exercises      General Comments        Pertinent Vitals/Pain Pain Assessment: No/denies pain    Home Living                      Prior Function            PT Goals (current goals can now be found in the care plan section) Acute Rehab PT Goals Patient Stated Goal: home today PT Goal Formulation: With patient Time For Goal Achievement: 04/06/20 Potential to Achieve  Goals: Good Progress towards PT goals: Progressing toward goals    Frequency    Min 3X/week      PT Plan Current plan remains appropriate    Co-evaluation              AM-PAC PT "6 Clicks" Mobility   Outcome Measure  Help needed turning from your back to your side while in a flat bed without using bedrails?: None Help needed moving from lying on your back to sitting on the side of a flat bed without using bedrails?: None Help  needed moving to and from a bed to a chair (including a wheelchair)?: None Help needed standing up from a chair using your arms (e.g., wheelchair or bedside chair)?: None Help needed to walk in hospital room?: None Help needed climbing 3-5 steps with a railing? : None 6 Click Score: 24    End of Session Equipment Utilized During Treatment: Gait belt Activity Tolerance: Patient tolerated treatment well Patient left: in chair;with call bell/phone within reach Nurse Communication: Mobility status PT Visit Diagnosis: Unsteadiness on feet (R26.81);Other symptoms and signs involving the nervous system (R29.898)     Time: 0131-4388 PT Time Calculation (min) (ACUTE ONLY): 18 min  Charges:  $Gait Training: 8-22 mins                     Mabeline Caras, PT, DPT Acute Rehabilitation Services  Pager [phone] Office [place] 03/23/2020, 1:08 PM

## 2020-03-23 NOTE — Care Management Important Message (Signed)
Important Message  Patient Details  Name: [name] [name] MRN: [id number] Date of Birth: May 08, 1946   Medicare Important Message Given:  Yes     Shelda Altes 03/23/2020, 10:14 AM

## 2020-03-23 NOTE — Discharge Summary (Signed)
Physician Discharge Summary  [name] [name] LPF:[id number] DOB: [birth year] DOA: 03/08/2020  PCP: [name], MD  Admit date: 03/08/2020.  Discharge date: 03/23/2020.  Admitted From:  Home.  Disposition:  Home.  Recommendations for Outpatient Follow-up:  1. Follow up with PCP in 1-2 weeks. 2. Please obtain BMP/CBC in one week. 3. Advised to follow-up with vascular surgery Dr. Carlis Abbott on 04/05/20 @10 : 73. 4. Advised to continue aspirin monotherapy, Plavix has been discontinued. 5. Advised to avoid all other anticoagulants. 6. Advised to contact vascular surgery if hematoma worsens or swelling increases. 7. Advised to follow-up with neurology As scheduled.  Home Health: None.  Equipment/Devices: None.  Discharge Condition: Stable. CODE STATUS:Full code Diet recommendation: Heart Healthy.  Brief Summary / Hospital course: This 74 yrs old Male was admitted with CVA . He was found to have significant stenosis on carotid duplex. He underwent Left carotid endarterectomy on 9/10 with postop complication of large hematoma secondary to uncontrolled blood pressure. Hematoma s/p washout. He was transferred to ICU after washout , started on Cleviprex for BP control which was weaned down on 9/16; and now blood pressure well controlled on losartan, Norvasc, Coreg, HCTZ.  He was briefly hypoxic but now on room air saturating normal.   He was transferred out of ICU on 9/18.  Vascular surgery following, hematoma remained stable, H and H remained stable, Aspirin resumed. Hematoma has slightly reduced in size, patient was continued on aspirin, vascular surgery cleared the patient to be discharged.  Advised aspirin monotherapy.  No Plavix and no other anticoagulant until following up with vascular surgery.  Patient has appointment with Dr. Carlis Abbott on October 5 at 10:40.  Patient was given contact number to call if has worsening swelling or increase in size of hematoma.  Patient is being discharged home.  Patient is  going to be staying with daughter for some time.  He was managed for below problems.  Discharge Diagnoses:  Principal Problem:   Acute ischemic stroke [facility]) Active Problems:   Diabetes mellitus type II, non insulin dependent ([place])   Memory loss   Renal insufficiency   Hypertension   Stroke [facility])   Carotid stenosis, symptomatic, with infarction ([place])   Hematoma of neck   Acute respiratory failure with hypoxia ([facility])  Acute CVA : MRI brain 9/7 small acute infarcts in left frontal and occipital lobes, moderate cerebral atrophy, sequelae of remote bilateral basal ganglia thalamic and cerebellar insults. He underwent left carotid endarterectomy 9/10, developed hematoma postoperatively. S/p hematoma evacuation, tolerated well. Hematoma post left CEA. Hematoma stable, H/H stable. -Vascular surgery is following, recommended to resume aspirin and hold Plavix for now -Mobilize per PT/ Out of bed to chair.  Hypertensive urgency -improved - Cleviprex off. - BP within goal range. - cont coreg - continue amlodipine 10 mg - continue losartan, HCTZ.  Left neck hematoma :  Hematoma appears stable, still has significant swelling. Vascular surgery is following recommended to resume aspirin and hold Plavix for now. There was slight oozing from incision.  AKI could be sec.to dehydration: Gentle hydration. Recheck BMP in am.   Discharge Instructions  Discharge Instructions    Ambulatory referral to Neurology   Complete by: As directed    Follow up with [facility]. Leta Baptist at [facility] in 4 weeks. Pt is [name] patient. Thanks.   Ambulatory referral to Occupational Therapy   Complete by: As directed    Ambulatory referral to Speech Therapy   Complete by: As directed    Call MD  for:  difficulty breathing, headache or visual disturbances   Complete by: As directed    Call MD for:  persistant dizziness or light-headedness   Complete by: As directed    Call MD for:  persistant nausea and  vomiting   Complete by: As directed    Diet - low sodium heart healthy   Complete by: As directed    Diet Carb Modified   Complete by: As directed    Discharge instructions   Complete by: As directed    Advised to follow-up with primary care physician in 1 week. Advised to follow-up with vascular surgery Dr. Carlis Abbott on10/5 @1040 . Advised to continue aspirin, Plavix has been discontinued. Advised to avoid all other anticoagulant except aspirin. Advised to contact vascular surgery if hematoma worsens or swelling increases.   Discharge wound care:   Complete by: As directed    Follow up Vascular surgery on 10/05@ 10: 40 am.   Increase activity slowly   Complete by: As directed      Allergies as of 03/23/2020   No Known Allergies     Medication List    STOP taking these medications   clopidogrel 75 MG tablet Commonly known as: PLAVIX   metoprolol succinate 100 MG 24 hr tablet Commonly known as: TOPROL-XL   metoprolol succinate 50 MG 24 hr tablet Commonly known as: TOPROL-XL   oxymetazoline 0.05 % nasal spray Commonly known as: AFRIN     TAKE these medications   acetaminophen 500 MG tablet Commonly known as: TYLENOL Take 500 mg by mouth every 6 (six) hours as needed for mild pain or headache.   allopurinol 100 MG tablet Commonly known as: ZYLOPRIM Take 200 mg by mouth daily.   amLODipine 10 MG tablet Commonly known as: NORVASC Take 1 tablet (10 mg total) by mouth daily. Start taking on: March 24, 2020   aspirin 81 MG EC tablet Take 1 tablet (81 mg total) by mouth daily. Swallow whole. Start taking on: March 24, 2020   carvedilol 25 MG tablet Commonly known as: COREG Take 1 tablet (25 mg total) by mouth 2 (two) times daily with a meal.   citalopram 20 MG tablet Commonly known as: CELEXA Take 20 mg by mouth daily. What changed: Another medication with the same name was removed. Continue taking this medication, and follow the directions you see here.    colestipol 1 g tablet Commonly known as: COLESTID Take 1 g by mouth 2 (two) times daily between meals as needed (for diarrhea).   [employer] Spectravite Men 50+ Tabs Take 1 tablet by mouth daily with breakfast.   cyanocobalamin 1000 MCG tablet Take 1 tablet (1,000 mcg total) by mouth daily. Start taking on: March 24, 2020   donepezil 10 MG tablet Commonly known as: ARICEPT Take 10 mg by mouth every evening.   folic acid 1 MG tablet Commonly known as: FOLVITE Take 1 tablet (1 mg total) by mouth daily. Start taking on: March 24, 2020   furosemide 20 MG tablet Commonly known as: LASIX Take 20 mg by mouth daily as needed for edema. What changed: Another medication with the same name was removed. Continue taking this medication, and follow the directions you see here.   glimepiride 1 MG tablet Commonly known as: AMARYL Take 1 mg by mouth daily with breakfast.   hydrochlorothiazide 50 MG tablet Commonly known as: HYDRODIURIL Take 1 tablet (50 mg total) by mouth daily. Start taking on: March 24, 2020   HYDROcodone-homatropine 5-1.5 MG/5ML syrup  Commonly known as: HYCODAN Take 5 mLs by mouth every 6 (six) hours as needed for cough.   losartan 100 MG tablet Commonly known as: COZAAR Take 1 tablet (100 mg total) by mouth daily. Start taking on: March 24, 2020   memantine 10 MG tablet Commonly known as: NAMENDA Take 10 mg by mouth 2 (two) times daily.   omeprazole 40 MG capsule Commonly known as: PRILOSEC Take 40 mg by mouth daily before breakfast. What changed: Another medication with the same name was removed. Continue taking this medication, and follow the directions you see here.   rosuvastatin 20 MG tablet Commonly known as: CRESTOR Take 20 mg by mouth daily.   tamsulosin 0.4 MG Caps capsule Commonly known as: FLOMAX Take 0.4 mg by mouth daily.            Discharge Care Instructions  (From admission, onward)         Start     Ordered    03/23/20 0000  Discharge wound care:       Comments: Follow up Vascular surgery on 10/05@ 10: 40 am.   03/23/20 1141          Follow-up Information    Penumalli, Earlean Polka, MD. Schedule an appointment as soon as possible for a visit in 4 week(s).   Specialties: Neurology, Radiology Contact information: [address] [place] [address] [facility] Follow up.   Why: The outpatient therapy will contact you for the first appointment Contact information: [phone]       Marty Heck, MD In 2 weeks.   Specialty: Vascular Surgery Why: Office will call you to arrange your appt (sent) Contact information: [place] [address] [phone]        Bertram Millard A, MD Follow up in 1 week(s).   Specialty: Family Medicine Contact information: [place] [address] [phone]              No Known Allergies  Consultations:  Vascular surgery, PCCM.   Procedures/Studies: CT Angio Head W or Wo Contrast  Result Date: 03/08/2020 CLINICAL DATA:  Neuro deficit EXAM: CT ANGIOGRAPHY HEAD AND NECK TECHNIQUE: Multidetector CT imaging of the head and neck was performed using the standard protocol during bolus administration of intravenous contrast. Multiplanar CT image reconstructions and MIPs were obtained to evaluate the vascular anatomy. Carotid stenosis measurements (when applicable) are obtained utilizing NASCET criteria, using the distal internal carotid diameter as the denominator. CONTRAST:  7mL OMNIPAQUE IOHEXOL 350 MG/ML SOLN COMPARISON:  03/08/2020 head CT and prior. FINDINGS: CTA NECK FINDINGS Aortic arch: Standard branching. Imaged portion shows no evidence of aneurysm or dissection. Extensive noncalcified atheromatous plaque involving the aortic arch and great vessel origins. Some of the plaque demonstrates a pedunculated appearance. Approximately 30% luminal narrowing of the  innominate artery secondary to atheromatous disease. No significant stenosis of the left common carotid and left subclavian artery origins. Right carotid system: Noncalcified atheromatous disease involving the proximal right common carotid artery without significant luminal narrowing. Carotid bifurcation calcified atheromatous plaque without high-grade narrowing. Mild narrowing of the proximal right petrous segment secondary to noncalcified atheromatous plaque. Left carotid system: Patent normal caliber common carotid artery. Carotid bifurcation atherosclerotic calcifications with approximately 80-90% narrowing of the proximal ICA. The carotid bulb is narrowed by approximately 50%. Vertebral arteries: Dominant right vertebral artery. Right V1 and V4 segment calcified atheromatous plaque with mild narrowing. Left  vertebral artery atheromatous disease with no greater than mild narrowing. Skeleton: Multilevel spondylosis.  No acute osseous abnormalities. Other neck: No adenopathy.  No soft tissue mass. Upper chest: Emphysema. Dependent atelectasis. Motion artifact limits evaluation. Review of the MIP images confirms the above findings CTA HEAD FINDINGS Anterior circulation: Bilateral carotid siphon atherosclerotic calcifications. Mild narrowing of the left paraclinoid ICA. Patent, normal caliber appearance of the anterior and middle cerebral arteries. Posterior circulation: Dominant right vertebral artery. Normal caliber patent basilar, superior cerebellar and posterior cerebral arteries. Venous sinuses: As permitted by contrast timing, patent. Anatomic variants: None. 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 30% luminal narrowing of the innominate secondary to atheromatous plaque. Approximately 50% narrowing of the left carotid bulb and 80-90% narrowing of the proximal left ICA secondary to atheromatous plaque. Mild  right V1 and V4 segment narrowing secondary to atheromatous disease. Mild narrowing of the proximal right petrous and left paraclinoid ICA. Electronically Signed   By: Primitivo Gauze M.D.   On: 03/08/2020 19:00   CT Head Wo Contrast  Result Date: 03/08/2020 CLINICAL DATA:  Head trauma, minor (Age >= 65y) Post fall with vomiting. EXAM: CT HEAD WITHOUT CONTRAST TECHNIQUE: Contiguous axial images were obtained from the base of the skull through the vertex without intravenous contrast. COMPARISON:  Head CT 12/05/2019, brain MRI 12/07/2019 FINDINGS: Brain: No acute hemorrhage or evidence of acute ischemia. Stable degree of atrophy, ventricular dilatation, and periventricular chronic small vessel ischemia. Remote lacunar infarcts in the left greater than right basal ganglia. No midline shift or mass effect. No subdural or extra-axial collection. Vascular: Atherosclerosis of skullbase vasculature without hyperdense vessel or abnormal calcification. Skull: No fracture or focal lesion. Sinuses/Orbits: Minimal chronic mucosal thickening paranasal sinuses. No sinus fluid level or acute findings. The mastoid air cells are clear. Orbits are unremarkable. Other: None. IMPRESSION: 1. No acute intracranial abnormality. No skull fracture. 2. Stable atrophy, chronic small vessel ischemia, and remote basal gangliar lacunar infarcts. Electronically Signed   By: Keith Rake M.D.   On: 03/08/2020 17:00   CT Angio Neck W and/or Wo Contrast  Result Date: 03/08/2020 CLINICAL DATA:  Neuro deficit EXAM: CT ANGIOGRAPHY HEAD AND NECK TECHNIQUE: Multidetector CT imaging of the head and neck was performed using the standard protocol during bolus administration of intravenous contrast. Multiplanar CT image reconstructions and MIPs were obtained to evaluate the vascular anatomy. Carotid stenosis measurements (when applicable) are obtained utilizing NASCET criteria, using the distal internal carotid diameter as the denominator.  CONTRAST:  57mL OMNIPAQUE IOHEXOL 350 MG/ML SOLN COMPARISON:  03/08/2020 head CT and prior. FINDINGS: CTA NECK FINDINGS Aortic arch: Standard branching. Imaged portion shows no evidence of aneurysm or dissection. Extensive noncalcified atheromatous plaque involving the aortic arch and great vessel origins. Some of the plaque demonstrates a pedunculated appearance. Approximately 30% luminal narrowing of the innominate artery secondary to atheromatous disease. No significant stenosis of the left common carotid and left subclavian artery origins. Right carotid system: Noncalcified atheromatous disease involving the proximal right common carotid artery without significant luminal narrowing. Carotid bifurcation calcified atheromatous plaque without high-grade narrowing. Mild narrowing of the proximal right petrous segment secondary to noncalcified atheromatous plaque. Left carotid system: Patent normal caliber common carotid artery. Carotid bifurcation atherosclerotic calcifications with approximately 80-90% narrowing of the proximal ICA. The carotid bulb is narrowed by approximately 50%. Vertebral arteries: Dominant right vertebral artery. Right V1 and V4 segment calcified atheromatous plaque with  mild narrowing. Left vertebral artery atheromatous disease with no greater than mild narrowing. Skeleton: Multilevel spondylosis.  No acute osseous abnormalities. Other neck: No adenopathy.  No soft tissue mass. Upper chest: Emphysema. Dependent atelectasis. Motion artifact limits evaluation. Review of the MIP images confirms the above findings CTA HEAD FINDINGS Anterior circulation: Bilateral carotid siphon atherosclerotic calcifications. Mild narrowing of the left paraclinoid ICA. Patent, normal caliber appearance of the anterior and middle cerebral arteries. Posterior circulation: Dominant right vertebral artery. Normal caliber patent basilar, superior cerebellar and posterior cerebral arteries. Venous sinuses: As permitted  by contrast timing, patent. Anatomic variants: None. 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 30% luminal narrowing of the innominate secondary to atheromatous plaque. Approximately 50% narrowing of the left carotid bulb and 80-90% narrowing of the proximal left ICA secondary to atheromatous plaque. Mild right V1 and V4 segment narrowing secondary to atheromatous disease. Mild narrowing of the proximal right petrous and left paraclinoid ICA. Electronically Signed   By: Primitivo Gauze M.D.   On: 03/08/2020 19:00   CT Cervical Spine Wo Contrast  Result Date: 03/08/2020 CLINICAL DATA:  Neck trauma (Age >= 65y) Post fall with vomiting. EXAM: CT CERVICAL SPINE WITHOUT CONTRAST TECHNIQUE: Multidetector CT imaging of the cervical spine was performed without intravenous contrast. Multiplanar CT image reconstructions were also generated. COMPARISON:  None. FINDINGS: Alignment: Normal. Skull base and vertebrae: No acute fracture. Vertebral body heights are maintained. The dens and skull base are intact. Soft tissues and spinal canal: No prevertebral fluid or swelling. No visible canal hematoma. Disc levels: Disc space narrowing and endplate spurring at D6-Q2. There is multilevel endplate spurring at additional levels with preservation of disc spaces. Facet hypertrophy at C4-C5 on the left and multilevel on the right. Upper chest: Breathing motion artifact. No evidence of acute abnormality. Other: Carotid calcifications. IMPRESSION: Degenerative change in the cervical spine without acute fracture or subluxation. Electronically Signed   By: Keith Rake M.D.   On: 03/08/2020 17:04   MR BRAIN WO CONTRAST  Result Date: 03/08/2020 CLINICAL DATA:  Neuro deficit EXAM: MRI HEAD WITHOUT CONTRAST TECHNIQUE: Multiplanar, multiecho pulse sequences of the brain and surrounding structures were obtained without intravenous  contrast. COMPARISON:  Concurrent CTA head and neck. 12/07/2019 MRI head and prior. FINDINGS: Brain: Moderate cerebral atrophy with ex vacuo dilatation. Scattered and confluent T2/FLAIR hyperintense foci involving the periventricular and subcortical white matter are nonspecific however commonly associated with chronic microvascular ischemic changes. Small foci of restricted diffusion involving the left frontal lobe at the vertex and left occipital lobe (2:20, 44). No intracranial hemorrhage. No midline shift, mass lesion or extra-axial fluid collection. Sequela of remote bilateral basal ganglia, thalamic and cerebellar insults. Vascular: Please see concurrent CTA Skull and upper cervical spine: Normal marrow signal. Sinuses/Orbits: Normal orbits. Mild pansinus disease. No mastoid effusion. Other: None. IMPRESSION: Small acute infarcts involving the left frontal and occipital lobes. Moderate cerebral atrophy and chronic microvascular ischemic changes. Remote bilateral basal ganglia, thalamic and cerebellar insults. Mild pansinus disease. These results were called by telephone at the time of interpretation on 03/08/2020 at 7:13 pm to provider ADAM CURATOLO , who verbally acknowledged these results. Electronically Signed   By: Primitivo Gauze M.D.   On: 03/08/2020 19:15   MR CERVICAL SPINE WO CONTRAST  Result Date: 03/08/2020 CLINICAL DATA:  Initial evaluation for acute myelopathy. EXAM: MRI CERVICAL AND THORACIC SPINE WITHOUT CONTRAST TECHNIQUE: Multiplanar and multiecho pulse  sequences of the cervical spine, to include the craniocervical junction and cervicothoracic junction, and the thoracic spine, were obtained without intravenous contrast. COMPARISON:  None available. FINDINGS: MRI CERVICAL SPINE FINDINGS Examination degraded by motion artifact. Alignment: Physiologic with preservation of the normal cervical lordosis. No listhesis. Vertebrae: Vertebral body height maintained without acute or chronic fracture.  Bone marrow signal intensity within normal limits. No worrisome osseous lesions. Minimal reactive marrow edema seen about the left C6-7 facet due to facet arthritis. No other abnormal marrow edema. Cord: Normal signal and morphology. Posterior Fossa, vertebral arteries, paraspinal tissues: Visualized brain and posterior fossa within normal limits. Craniocervical junction normal. Paraspinous and prevertebral soft tissues within normal limits. Normal intravascular flow voids seen within the vertebral arteries bilaterally. Disc levels: C2-C3: Negative interspace. Right greater than left facet hypertrophy with associated ankylosis on the right. No spinal stenosis. Foramina remain patent. C3-C4: Minimal disc bulge with uncovertebral hypertrophy. Moderate right greater than left facet and ligament flavum hypertrophy. No significant spinal stenosis. Moderate bilateral C4 foraminal narrowing. C4-C5: Mild disc bulge with uncovertebral hypertrophy. Superimposed small central/right paracentral disc protrusion mildly indents the ventral thecal sac (series 5, image 20). Moderate right with mild left facet hypertrophy. No significant spinal stenosis. Moderate left with mild to moderate right C5 foraminal stenosis. C5-C6: Degenerative intervertebral disc space narrowing with diffuse disc bulge and bilateral uncovertebral hypertrophy. Moderate left with mild right facet degeneration with associated ankylosis on the left. No significant spinal stenosis. Moderate left with mild right C6 foraminal narrowing. C6-C7: Mild annular disc bulge. Mild to moderate bilateral facet hypertrophy. No significant spinal stenosis. Foramina remain patent. C7-T1: Minimal disc bulge. Mild facet hypertrophy. No canal or foraminal stenosis. MRI THORACIC SPINE FINDINGS Examination degraded by motion artifact. Alignment: Mild scoliosis with exaggeration of the normal thoracic kyphosis. No listhesis. Vertebrae: Vertebral body height maintained without acute  or chronic fracture. Bone marrow signal intensity within normal limits. Few scattered benign hemangiomata noted. No worrisome osseous lesions. No abnormal marrow edema. Cord: Normal signal and morphology. No convincing cord signal abnormality on this motion degraded exam. Conus medullaris terminates at approximately the L1 level. Paraspinal and other soft tissues: Negative. Disc levels: No significant degenerative disc disease seen for patient age. No disc bulge or focal disc herniation. No significant canal or foraminal stenosis. No neural impingement. IMPRESSION: 1. Normal MRI of the cervicothoracic spinal cord. No cord signal changes to suggest myelopathy or other abnormality. 2. Mild multilevel cervical spondylosis and facet arthrosis without significant spinal stenosis. Associated mild to moderate bilateral C4 through C6 foraminal narrowing as above. 3. No significant disc pathology within the thoracic spine for patient age. No thoracic spinal stenosis or neural impingement. Electronically Signed   By: Jeannine Boga M.D.   On: 03/08/2020 23:30   MR THORACIC SPINE WO CONTRAST  Result Date: 03/08/2020 CLINICAL DATA:  Initial evaluation for acute myelopathy. EXAM: MRI CERVICAL AND THORACIC SPINE WITHOUT CONTRAST TECHNIQUE: Multiplanar and multiecho pulse sequences of the cervical spine, to include the craniocervical junction and cervicothoracic junction, and the thoracic spine, were obtained without intravenous contrast. COMPARISON:  None available. FINDINGS: MRI CERVICAL SPINE FINDINGS Examination degraded by motion artifact. Alignment: Physiologic with preservation of the normal cervical lordosis. No listhesis. Vertebrae: Vertebral body height maintained without acute or chronic fracture. Bone marrow signal intensity within normal limits. No worrisome osseous lesions. Minimal reactive marrow edema seen about the left C6-7 facet due to facet arthritis. No other abnormal marrow edema. Cord: Normal signal  and morphology. Posterior  Fossa, vertebral arteries, paraspinal tissues: Visualized brain and posterior fossa within normal limits. Craniocervical junction normal. Paraspinous and prevertebral soft tissues within normal limits. Normal intravascular flow voids seen within the vertebral arteries bilaterally. Disc levels: C2-C3: Negative interspace. Right greater than left facet hypertrophy with associated ankylosis on the right. No spinal stenosis. Foramina remain patent. C3-C4: Minimal disc bulge with uncovertebral hypertrophy. Moderate right greater than left facet and ligament flavum hypertrophy. No significant spinal stenosis. Moderate bilateral C4 foraminal narrowing. C4-C5: Mild disc bulge with uncovertebral hypertrophy. Superimposed small central/right paracentral disc protrusion mildly indents the ventral thecal sac (series 5, image 20). Moderate right with mild left facet hypertrophy. No significant spinal stenosis. Moderate left with mild to moderate right C5 foraminal stenosis. C5-C6: Degenerative intervertebral disc space narrowing with diffuse disc bulge and bilateral uncovertebral hypertrophy. Moderate left with mild right facet degeneration with associated ankylosis on the left. No significant spinal stenosis. Moderate left with mild right C6 foraminal narrowing. C6-C7: Mild annular disc bulge. Mild to moderate bilateral facet hypertrophy. No significant spinal stenosis. Foramina remain patent. C7-T1: Minimal disc bulge. Mild facet hypertrophy. No canal or foraminal stenosis. MRI THORACIC SPINE FINDINGS Examination degraded by motion artifact. Alignment: Mild scoliosis with exaggeration of the normal thoracic kyphosis. No listhesis. Vertebrae: Vertebral body height maintained without acute or chronic fracture. Bone marrow signal intensity within normal limits. Few scattered benign hemangiomata noted. No worrisome osseous lesions. No abnormal marrow edema. Cord: Normal signal and morphology. No  convincing cord signal abnormality on this motion degraded exam. Conus medullaris terminates at approximately the L1 level. Paraspinal and other soft tissues: Negative. Disc levels: No significant degenerative disc disease seen for patient age. No disc bulge or focal disc herniation. No significant canal or foraminal stenosis. No neural impingement. IMPRESSION: 1. Normal MRI of the cervicothoracic spinal cord. No cord signal changes to suggest myelopathy or other abnormality. 2. Mild multilevel cervical spondylosis and facet arthrosis without significant spinal stenosis. Associated mild to moderate bilateral C4 through C6 foraminal narrowing as above. 3. No significant disc pathology within the thoracic spine for patient age. No thoracic spinal stenosis or neural impingement. Electronically Signed   By: Jeannine Boga M.D.   On: 03/08/2020 23:30   DG CHEST PORT 1 VIEW  Result Date: 03/16/2020 CLINICAL DATA:  Shortness of breath. EXAM: PORTABLE CHEST 1 VIEW COMPARISON:  March 14, 2020. FINDINGS: Stable cardiomediastinal silhouette. No pneumothorax or pleural effusion is noted. Significantly improved bilateral lung opacities are noted. Bony thorax is unremarkable. IMPRESSION: Significantly improved bilateral lung opacities compared to prior exam. Electronically Signed   By: Marijo Conception M.D.   On: 03/16/2020 08:11   DG Chest Port 1 View  Result Date: 03/14/2020 CLINICAL DATA:  Acute respiratory failure. EXAM: PORTABLE CHEST 1 VIEW COMPARISON:  03/13/2020 FINDINGS: Improvement in right upper lobe atelectasis. There remains mild airspace disease in the right upper lobe and right lower lobe. No effusion Negative for heart failure or edema.  Left lung clear. IMPRESSION: Improvement in right upper lobe atelectasis. Persistent mild airspace disease right upper lobe and right lower lobe. Electronically Signed   By: Franchot Gallo M.D.   On: 03/14/2020 08:17   DG Chest Port 1 View  Result Date:  03/13/2020 CLINICAL DATA:  Postop day 2 carotid endarterectomy. Follow-up RIGHT UPPER LOBE atelectasis. EXAM: PORTABLE CHEST 1 VIEW COMPARISON:  03/12/2020 and earlier. FINDINGS: Cardiac silhouette normal in size for AP portable technique. Improved aeration in the RIGHT UPPER LOBE since yesterday, though atelectasis  persists. Stable scarring at the RIGHT lung base. No new pulmonary parenchymal abnormalities. IMPRESSION: Improved aeration in the RIGHT UPPER LOBE since yesterday, though atelectasis persists. No new abnormalities. Electronically Signed   By: Evangeline Dakin M.D.   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 EXAM: PORTABLE CHEST 1 VIEW COMPARISON:  03/10/2020 and earlier, including CT chest 01/28/2018. FINDINGS: Cardiac silhouette mildly enlarged for the AP portable technique and the patient's suboptimal inspiration. Complete dense atelectasis of the RIGHT UPPER LOBE. Mild atelectasis at the RIGHT lung base. LEFT lung remains clear. Pulmonary vascularity normal. No visible pleural effusions. IMPRESSION: 1. Complete dense atelectasis of the RIGHT UPPER LOBE. Mild atelectasis at the RIGHT lung base. 2. Mild cardiomegaly without pulmonary edema. Electronically Signed   By: Evangeline Dakin M.D.   On: 03/12/2020 16:16   DG CHEST PORT 1 VIEW  Result Date: 03/10/2020 CLINICAL DATA:  Cough. EXAM: PORTABLE CHEST 1 VIEW COMPARISON:  Radiograph 12/05/2019 FINDINGS: Mild cardiomegaly, similar to prior. Unchanged mediastinal contours. Mild peribronchial thickening. No confluent airspace disease. No significant pleural effusion. No pneumothorax. No acute osseous abnormalities are seen. IMPRESSION: 1. Stable mild cardiomegaly. 2. Mild peribronchial thickening, can be seen with bronchitis or asthma. Electronically Signed   By: Keith Rake M.D.   On: 03/10/2020  17:31   ECHOCARDIOGRAM COMPLETE  Result Date: 03/09/2020    ECHOCARDIOGRAM REPORT   Patient Name:   MAAHIR HORST Date of Exam: 03/09/2020 Medical Rec #:  [id number]     Height:       69.0 in Accession #:    [id number]    Weight:       185.4 lb Date of Birth:  16-Apr-1946     BSA:          2.001 m Patient Age:    54 years      BP:           132/75 mmHg Patient Gender: M             HR:           71 bpm. Exam Location:  Inpatient Procedure: 2D Echo, Cardiac Doppler and Color Doppler Indications:    Stroke 434.91 / I163.9  History:        Patient has no prior history of Echocardiogram examinations.                 Risk Factors:Hypertension, Dyslipidemia and Diabetes. GERD. CKD.                 Abdominal Aortic Aneurysm.  Sonographer:    Jonelle Sidle Dance Referring Phys: [id number] Weir  1. Left ventricular ejection fraction, by estimation, is 55 to 60%. The left ventricle has normal function. The left ventricle has no regional wall motion abnormalities. Left ventricular diastolic parameters were normal.  2. Right ventricular systolic function is normal. The right ventricular size is normal.  3. Left atrial size was mildly dilated.  4. The mitral valve is normal in structure. Mild mitral valve regurgitation. No evidence of mitral stenosis.  5. The aortic valve is tricuspid. Aortic valve regurgitation is mild. Mild aortic valve sclerosis is present, with no evidence of aortic valve stenosis.  6. The inferior vena cava is normal in size with greater than 50% respiratory variability, suggesting right atrial pressure of 3 mmHg. FINDINGS  Left  Ventricle: Left ventricular ejection fraction, by estimation, is 55 to 60%. The left ventricle has normal function. The left ventricle has no regional wall motion abnormalities. The left ventricular internal cavity size was normal in size. There is  no left ventricular hypertrophy. Left ventricular diastolic parameters were normal. Right Ventricle: The right ventricular  size is normal. No increase in right ventricular wall thickness. Right ventricular systolic function is normal. Left Atrium: Left atrial size was mildly dilated. Right Atrium: Right atrial size was normal in size. Pericardium: There is no evidence of pericardial effusion. Mitral Valve: The mitral valve is normal in structure. Mild mitral valve regurgitation. No evidence of mitral valve stenosis. Tricuspid Valve: The tricuspid valve is normal in structure. Tricuspid valve regurgitation is not demonstrated. No evidence of tricuspid stenosis. Aortic Valve: The aortic valve is tricuspid. Aortic valve regurgitation is mild. Aortic regurgitation PHT measures 390 msec. Mild aortic valve sclerosis is present, with no evidence of aortic valve stenosis. Pulmonic Valve: The pulmonic valve was normal in structure. Pulmonic valve regurgitation is not visualized. No evidence of pulmonic stenosis. Aorta: The aortic root is normal in size and structure. Venous: The inferior vena cava is normal in size with greater than 50% respiratory variability, suggesting right atrial pressure of 3 mmHg. IAS/Shunts: No atrial level shunt detected by color flow Doppler.  LEFT VENTRICLE PLAX 2D LVIDd:         5.30 cm LVIDs:         3.80 cm LV PW:         1.10 cm LV IVS:        1.10 cm LVOT diam:     2.20 cm LV SV:         90 LV SV Index:   45 LVOT Area:     3.80 cm  RIGHT VENTRICLE          IVC RV Basal diam:  1.90 cm  IVC diam: 2.00 cm TAPSE (M-mode): 1.7 cm LEFT ATRIUM             Index       RIGHT ATRIUM          Index LA diam:        4.20 cm 2.10 cm/m  RA Area:     9.05 cm LA Vol (A2C):   85.8 ml 42.89 ml/m RA Volume:   13.90 ml 6.95 ml/m LA Vol (A4C):   66.5 ml 33.24 ml/m LA Biplane Vol: 76.3 ml 38.14 ml/m  AORTIC VALVE LVOT Vmax:   111.50 cm/s LVOT Vmean:  73.400 cm/s LVOT VTI:    0.236 m AI PHT:      390 msec  AORTA Ao Root diam: 3.40 cm Ao Asc diam:  3.50 cm MITRAL VALVE MV Area (PHT): 2.95 cm    SHUNTS MV Decel Time: 257 msec     Systemic VTI:  0.24 m MV E velocity: 90.40 cm/s  Systemic Diam: 2.20 cm MV A velocity: 80.60 cm/s MV E/A ratio:  1.12 Jenkins Rouge MD Electronically signed by Jenkins Rouge MD Signature Date/Time: 03/09/2020/11:14:58 AM    Final    VAS US CAROTID  Result Date: 03/09/2020 Carotid Arterial Duplex Study Indications:       CVA and Aphasia. History of carotid stenosis. Risk Factors:      Hypertension, hyperlipidemia. Other Factors:     Abdominal Aortic Endovascular Stent Graft in 04/2013. Comparison Study:  CTA of the neck on 03/08/2020 showed right ICA withoug  significant stenosis. Left proximal ICA 80-90% stenosis with                    approximately 50% narrowing at the bulb.                    Carotid US on 05/31/2016 Performing Technologist: Velva Harman Sturdivant RDMS, RVT  Examination Guidelines: A complete evaluation includes B-mode imaging, spectral Doppler, color Doppler, and power Doppler as needed of all accessible portions of each vessel. Bilateral testing is considered an integral part of a complete examination. Limited examinations for reoccurring indications may be performed as noted.  Right Carotid Findings: +----------+--------+--------+--------+------------------+--------+           PSV cm/sEDV cm/sStenosisPlaque DescriptionComments +----------+--------+--------+--------+------------------+--------+ CCA Prox  81      17                                         +----------+--------+--------+--------+------------------+--------+ CCA Distal75      17                                         +----------+--------+--------+--------+------------------+--------+ ICA Prox  81      19                                         +----------+--------+--------+--------+------------------+--------+ ICA Distal81      20                                         +----------+--------+--------+--------+------------------+--------+ ECA       136     12                                          +----------+--------+--------+--------+------------------+--------+ +----------+--------+-------+----------------+-------------------+           PSV cm/sEDV cmsDescribe        Arm Pressure (mmHG) +----------+--------+-------+----------------+-------------------+ Subclavian206            Multiphasic, WNL                    +----------+--------+-------+----------------+-------------------+ +---------+--------+--+--------+--+---------+ VertebralPSV cm/s64EDV cm/s19Antegrade +---------+--------+--+--------+--+---------+  Left Carotid Findings: +----------+--------+--------+--------+------------------+--------+           PSV cm/sEDV cm/sStenosisPlaque DescriptionComments +----------+--------+--------+--------+------------------+--------+ CCA Prox  121     21                                         +----------+--------+--------+--------+------------------+--------+ CCA Distal75      18                                         +----------+--------+--------+--------+------------------+--------+ ICA Prox  422     76      60-79%  homogeneous                +----------+--------+--------+--------+------------------+--------+ ICA  Mid   91      30                                         +----------+--------+--------+--------+------------------+--------+ ICA Distal62      23                                         +----------+--------+--------+--------+------------------+--------+ ECA       243     27                                         +----------+--------+--------+--------+------------------+--------+ +----------+--------+--------+----------------+-------------------+           PSV cm/sEDV cm/sDescribe        Arm Pressure (mmHG) +----------+--------+--------+----------------+-------------------+ Subclavian170             Multiphasic, WNL                    +----------+--------+--------+----------------+-------------------+  +---------+--------+--+--------+--+---------+ VertebralPSV cm/s62EDV cm/s14Antegrade +---------+--------+--+--------+--+---------+ On 2D imaging the narrowing appears to be at the bulb/origin level with PSV suggest 80-99% stenosis. However, EDV suggest less - 60-79% stenosis.  Summary: Right Carotid: The extracranial vessels were near-normal with only minimal wall                thickening or plaque. Left Carotid: Velocities in the left ICA are consistent with a 60-79% stenosis.               PSV at the bulb level suggest 80-99% stenosis.  *See table(s) above for measurements and observations.  Electronically signed by Monica Martinez MD on 03/09/2020 at 4:45:36 PM.    Final     Left Carotid Endartectomy.   Subjective: Patient was seen and examined at bedside.  Overnight events noted.  Patient reports feeling much better and wants to be discharged home.  Left neck hematoma appears reduced in size,  there is slight oozing noted.  Patient denies any dizziness and headache.  Discharge Exam: Vitals:   03/23/20 0500 03/23/20 0716  BP:  117/66  Pulse: (!) 59 60  Resp: 15 17  Temp:  97.7 F (36.5 C)  SpO2: 99% 98%   Vitals:   03/22/20 2343 03/23/20 0351 03/23/20 0500 03/23/20 0716  BP: (!) 102/40 136/71  117/66  Pulse: 62  (!) 59 60  Resp: 16  15 17   Temp: 98 F (36.7 C) 98 F (36.7 C)  97.7 F (36.5 C)  TempSrc: Oral Oral  Oral  SpO2: 96%  99% 98%  Weight:      Height:        General: Pt is alert, awake, not in acute distress, Left Neck hematoma, slightly reduced in size, oozing noted. Cardiovascular: RRR, S1/S2 +, no rubs, no gallops Respiratory: CTA bilaterally, no wheezing, no rhonchi Abdominal: Soft, NT, ND, bowel sounds + Extremities: no edema, no cyanosis    The results of significant diagnostics from this hospitalization (including imaging, microbiology, ancillary and laboratory) are listed below for reference.     Microbiology: No results found for this or any  previous visit (from the past 240 hour(s)).   Labs: BNP (last 3 results) No results for input(s): BNP in the last 8760  hours. Basic Metabolic Panel: Recent Labs  Lab 03/18/20 0521 03/19/20 0147 03/20/20 0603 03/22/20 0119  NA 136 138 138 138  K 3.3* 3.9 4.0 3.4*  CL 106 105 105 103  CO2 27 24 24 25   GLUCOSE 111* 105* 118* 107*  BUN 12 15 19  27*  CREATININE 1.13 1.22 1.23 1.42*  CALCIUM 8.9 9.3 9.5 9.4  MG  --  1.8  --   --    Liver Function Tests: No results for input(s): AST, ALT, ALKPHOS, BILITOT, PROT, ALBUMIN in the last 168 hours. No results for input(s): LIPASE, AMYLASE in the last 168 hours. No results for input(s): AMMONIA in the last 168 hours. CBC: Recent Labs  Lab 03/17/20 0352 03/17/20 0352 03/18/20 0521 03/18/20 0521 03/19/20 0147 03/20/20 0603 03/21/20 0148 03/22/20 0119 03/23/20 0348  WBC 5.9  --  5.9  --  6.9 8.1  --  8.8  --   NEUTROABS 2.7  --  2.8  --  3.2  --   --   --   --   HGB 9.3*   < > 8.9*   < > 9.7* 10.6* 9.8* 9.7* 9.9*  HCT 28.6*   < > 28.2*   < > 30.2* 32.4* 29.7* 30.0* 31.2*  MCV 92.3  --  92.2  --  91.5 92.8  --  91.5  --   PLT 256  --  256  --  239 254  --  269  --    < > = values in this interval not displayed.   Cardiac Enzymes: No results for input(s): CKTOTAL, CKMB, CKMBINDEX, TROPONINI in the last 168 hours. BNP: Invalid input(s): POCBNP CBG: Recent Labs  Lab 03/22/20 1125 03/22/20 1607 03/22/20 2129 03/23/20 0626 03/23/20 1106  GLUCAP 206* 84 223* 127* 129*   D-Dimer No results for input(s): DDIMER in the last 72 hours. Hgb A1c No results for input(s): HGBA1C in the last 72 hours. Lipid Profile No results for input(s): CHOL, HDL, LDLCALC, TRIG, CHOLHDL, LDLDIRECT in the last 72 hours. Thyroid function studies No results for input(s): TSH, T4TOTAL, T3FREE, THYROIDAB in the last 72 hours.  Invalid input(s): FREET3 Anemia work up No results for input(s): VITAMINB12, FOLATE, FERRITIN, TIBC, IRON, RETICCTPCT in  the last 72 hours. Urinalysis    Component Value Date/Time   COLORURINE YELLOW 03/08/2020 2226   APPEARANCEUR CLEAR 03/08/2020 2226   LABSPEC 1.034 (H) 03/08/2020 2226   PHURINE 5.0 03/08/2020 2226   GLUCOSEU NEGATIVE 03/08/2020 2226   HGBUR NEGATIVE 03/08/2020 2226   BILIRUBINUR NEGATIVE 03/08/2020 2226   KETONESUR NEGATIVE 03/08/2020 2226   PROTEINUR 30 (A) 03/08/2020 2226   UROBILINOGEN 0.2 04/24/2013 1102   NITRITE NEGATIVE 03/08/2020 2226   LEUKOCYTESUR NEGATIVE 03/08/2020 2226   Sepsis Labs Invalid input(s): PROCALCITONIN,  WBC,  LACTICIDVEN Microbiology No results found for this or any previous visit (from the past 240 hour(s)).   Time coordinating discharge: Over 30 minutes  SIGNED:   Shawna Clamp, MD  [facility] 03/23/2020, 11:42 AM Pager   If 7PM-7AM, please contact night-coverage [email or web address]

## 2020-03-29 DIAGNOSIS — R5381 Other malaise: Secondary | ICD-10-CM | POA: Diagnosis not present

## 2020-03-29 DIAGNOSIS — M6281 Muscle weakness (generalized): Secondary | ICD-10-CM | POA: Diagnosis not present

## 2020-03-29 DIAGNOSIS — Z8673 Personal history of transient ischemic attack (TIA), and cerebral infarction without residual deficits: Secondary | ICD-10-CM | POA: Diagnosis not present

## 2020-03-30 DIAGNOSIS — I10 Essential (primary) hypertension: Secondary | ICD-10-CM | POA: Diagnosis not present

## 2020-03-30 DIAGNOSIS — Z8673 Personal history of transient ischemic attack (TIA), and cerebral infarction without residual deficits: Secondary | ICD-10-CM | POA: Diagnosis not present

## 2020-03-30 DIAGNOSIS — E1165 Type 2 diabetes mellitus with hyperglycemia: Secondary | ICD-10-CM | POA: Diagnosis not present

## 2020-03-30 DIAGNOSIS — Z6827 Body mass index (BMI) 27.0-27.9, adult: Secondary | ICD-10-CM | POA: Diagnosis not present

## 2020-03-30 DIAGNOSIS — Z9889 Other specified postprocedural states: Secondary | ICD-10-CM | POA: Diagnosis not present

## 2020-04-05 ENCOUNTER — Ambulatory Visit (INDEPENDENT_AMBULATORY_CARE_PROVIDER_SITE_OTHER): Payer: Self-pay | Admitting: Vascular Surgery

## 2020-04-05 ENCOUNTER — Other Ambulatory Visit: Payer: Self-pay

## 2020-04-05 VITALS — BP 99/62 | HR 65 | Temp 97.6°F | Resp 18 | Ht 69.0 in | Wt 181.0 lb

## 2020-04-05 DIAGNOSIS — I63239 Cerebral infarction due to unspecified occlusion or stenosis of unspecified carotid arteries: Secondary | ICD-10-CM

## 2020-04-05 DIAGNOSIS — S1093XD Contusion of unspecified part of neck, subsequent encounter: Secondary | ICD-10-CM

## 2020-04-05 NOTE — Progress Notes (Signed)
Patient name: [name] [name] MRN: [id number] DOB: Mar 10, 1946 Sex: male  REASON FOR VISIT: Postop check after left carotid endarterectomy  HPI: [name] [name] is a 74 y.o. 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 Ultimately postop course was complicated by neck hematoma on postop day 1 and he had to go to the OR for evacuation of his neck hematoma on 03/12/2020 and drain placement.  He then had ongoing hypertensive emergency with re-accumulation of hematoma even with drain in place.  When he returned to the floor his blood pressures was 294 mm Hg systolic.  Ultimately had a prolonged stay in ICU on a Cleviprex drip while trying to titrate oral medications to get his blood pressure better controlled.  On follow-up today  family states his blood pressures actually has been on the low side and his PCP has stopped his HCTZ as well as one additional drug.  He feels the neck hematoma got a little bit better.  No new neurologic events and feels neurologically intact.  Also had new onset afib with RVR in the hospital and anticoagulation deferred given neck hematoma.    Past Medical History:  Diagnosis Date  . AAA (abdominal aortic aneurysm) ([place])   . Arthritis    Gout  . Back pain   . CKD (chronic kidney disease)   . Diabetes mellitus without complication ([place])    type 2  . Encephalopathy acute   . GERD (gastroesophageal reflux disease)   . Gout   . Gout   . Hyperlipidemia   . Hypertension   . Lung nodule, solitary 2008   RLL, 8 mm in 2008, 14 mm in 2014  . Memory change   . Polyarthralgia   . Reflux   . Stroke [facility])    hx of multiple    Past Surgical History:  Procedure Laterality Date  . ABDOMINAL AORTIC ENDOVASCULAR STENT GRAFT N/A 04/29/2013   Procedure: ABDOMINAL AORTIC ENDOVASCULAR STENT GRAFT -GORE;  Surgeon: Mal Misty, MD;  Location: [place];  Service: Vascular;  Laterality: N/A;  .  COLONOSCOPY  2010  . ENDARTERECTOMY Left 03/11/2020   Procedure: ENDARTERECTOMY CAROTID LEFT;  Surgeon: Marty Heck, MD;  Location: [place];  Service: Vascular;  Laterality: Left;  . ENDARTERECTOMY Left 03/12/2020   Procedure: EVACUATION HEMATOMA  AND WASHOUT OUT LEFT NECK WITH DRAIN PLACEMENT;  Surgeon: Marty Heck, MD;  Location: [place];  Service: Vascular;  Laterality: Left;  Marland Kitchen [place]  . PATCH ANGIOPLASTY Left 03/11/2020   Procedure: PATCH ANGIOPLASTY WITH Rueben Bash BIOLOGIC PATCH;  Surgeon: Marty Heck, MD;  Location: [place];  Service: Vascular;  Laterality: Left;  . TIBIA FRACTURE SURGERY Left   . VASECTOMY    . WRIST FRACTURE SURGERY Left 1970    Family History  Problem Relation Age of Onset  . Diabetes Father   . Stroke Father   . Heart attack Father   . Alcohol abuse Father   . Heart attack Brother        X's 2  . Diabetes Brother   . Hypertension Brother   . Alcohol abuse Brother   . Coronary artery disease Mother   . Diabetes Mother   . Heart attack Mother   . Hypertension Brother   . Alcohol abuse Brother   . Diabetes Sister   . Dementia Maternal Aunt   . Alcohol abuse Maternal Uncle   . Alcohol  abuse Paternal Uncle   . Alcohol abuse Maternal Grandfather   . Alcohol abuse Paternal Grandfather     SOCIAL HISTORY: Social History   Tobacco Use  . Smoking status: Former Smoker    Packs/day: 1.00    Years: 20.00    Pack years: 20.00    Types: Cigarettes    Quit date: 07/03/1983    Years since quitting: 36.7  . Smokeless tobacco: Never Used  Substance Use Topics  . Alcohol use: Yes    Alcohol/week: 12.0 standard drinks    Types: 12 Cans of beer per week    Comment: 08/06/18 18 pk/week    No Known Allergies  Current Outpatient Medications  Medication Sig Dispense Refill  . acetaminophen (TYLENOL) 500 MG tablet Take 500 mg by mouth every 6 (six) hours as needed for mild pain or headache.     . allopurinol  (ZYLOPRIM) 100 MG tablet Take 200 mg by mouth daily.     Marland Kitchen aspirin EC 81 MG EC tablet Take 1 tablet (81 mg total) by mouth daily. Swallow whole. 30 tablet 11  . carvedilol (COREG) 25 MG tablet Take 1 tablet (25 mg total) by mouth 2 (two) times daily with a meal. 60 tablet 1  . citalopram (CELEXA) 20 MG tablet Take 20 mg by mouth daily.    . colestipol (COLESTID) 1 g tablet Take 1 g by mouth 2 (two) times daily between meals as needed (for diarrhea).     . donepezil (ARICEPT) 10 MG tablet Take 10 mg by mouth every evening.     . folic acid (FOLVITE) 1 MG tablet Take 1 tablet (1 mg total) by mouth daily. 30 tablet 0  . furosemide (LASIX) 20 MG tablet Take 20 mg by mouth daily as needed for edema.    Marland Kitchen glimepiride (AMARYL) 1 MG tablet Take 1 mg by mouth daily with breakfast.    . losartan (COZAAR) 100 MG tablet Take 1 tablet (100 mg total) by mouth daily. 30 tablet 01  . memantine (NAMENDA) 10 MG tablet Take 10 mg by mouth 2 (two) times daily.     . Multiple Vitamins-Minerals ([employer] SPECTRAVITE MEN 50+) TABS Take 1 tablet by mouth daily with breakfast.    . omeprazole (PRILOSEC) 40 MG capsule Take 40 mg by mouth daily before breakfast.    . rosuvastatin (CRESTOR) 20 MG tablet Take 20 mg by mouth daily.     . vitamin B-12 1000 MCG tablet Take 1 tablet (1,000 mcg total) by mouth daily. 30 tablet 1  . amLODipine (NORVASC) 10 MG tablet Take 1 tablet (10 mg total) by mouth daily. (Patient not taking: Reported on 04/05/2020) 30 tablet 1  . hydrochlorothiazide (HYDRODIURIL) 50 MG tablet Take 1 tablet (50 mg total) by mouth daily. (Patient not taking: Reported on 04/05/2020) 30 tablet 1  . HYDROcodone-homatropine (HYCODAN) 5-1.5 MG/5ML syrup Take 5 mLs by mouth every 6 (six) hours as needed for cough.    . tamsulosin (FLOMAX) 0.4 MG CAPS capsule Take 0.4 mg by mouth daily. (Patient not taking: Reported on 04/05/2020)     No current facility-administered medications for this visit.    REVIEW OF SYSTEMS:  [X]   denotes positive finding, [ ]  denotes negative finding Cardiac  Comments:  Chest pain or chest pressure:    Shortness of breath upon exertion:    Short of breath when lying flat:    Irregular heart rhythm:        Vascular    Pain in  calf, thigh, or hip brought on by ambulation:    Pain in feet at night that wakes you up from your sleep:     Blood clot in your veins:    Leg swelling:         Pulmonary    Oxygen at home:    Productive cough:     Wheezing:         Neurologic    Sudden weakness in arms or legs:     Sudden numbness in arms or legs:     Sudden onset of difficulty speaking or slurred speech:    Temporary loss of vision in one eye:     Problems with dizziness:         Gastrointestinal    Blood in stool:     Vomited blood:         Genitourinary    Burning when urinating:     Blood in urine:        Psychiatric    Major depression:         Hematologic    Bleeding problems:    Problems with blood clotting too easily:        Skin    Rashes or ulcers:        Constitutional    Fever or chills:      PHYSICAL EXAM: Vitals:   04/05/20 1030 04/05/20 1035  BP: (!) 90/56 99/62  Pulse: 65 65  Resp: 18   Temp: 97.6 F (36.4 C)   TempSrc: Temporal   SpO2: 98%   Weight: 181 lb (82.1 kg)   Height: 5\' 9"  (1.753 m)     GENERAL: The patient is a well-nourished male, in no acute distress. The vital signs are documented above. CARDIAC: There is a regular rate and rhythm.  VASCULAR:  Left neck hematoma moderate but softer with no active drainage or signs of infection PULMONARY: There is good air exchange bilaterally without wheezing or rales. MUSCULOSKELETAL: There are no major deformities or cyanosis. NEUROLOGIC: No focal weakness or paresthesias are detected.  Cranial nerve II through XII grossly intact SKIN: There are no ulcers or rashes noted. PSYCHIATRIC: The patient has a normal affect.  DATA:   None  Assessment/Plan:  74 year old male underwent  left carotid endarterectomy on 03/11/2020 for symptomatic high-grade stenosis.  Postop course complicated by hypertensive emergency and ultimately a left neck hematoma requiring washout on 03/12/2020 with drain placement.  Fortunately remains neurologically intact.  I think his neck hematoma is a bit softer since discharge.  Discussed that he stay on aspirin from my standpoint but would continue to delay starting Plavix given the neck hematoma.  In addition I would not start him on anticoagulation for A. fib at this time as previously discussed in the hospital since I think risks likely exceed benefits at this time.  We can re-evaluate this decision in future as his neck hematoma improves.  I will see him again in 1 month for another wound check and he knows to call our office if any concerns develop in the interim.   Marty Heck, MD [facility] Office: [phone]

## 2020-04-07 ENCOUNTER — Telehealth: Payer: Self-pay

## 2020-04-07 DIAGNOSIS — I69398 Other sequelae of cerebral infarction: Secondary | ICD-10-CM | POA: Diagnosis not present

## 2020-04-07 DIAGNOSIS — Z6827 Body mass index (BMI) 27.0-27.9, adult: Secondary | ICD-10-CM | POA: Diagnosis not present

## 2020-04-07 DIAGNOSIS — H6002 Abscess of left external ear: Secondary | ICD-10-CM | POA: Diagnosis not present

## 2020-04-07 DIAGNOSIS — H539 Unspecified visual disturbance: Secondary | ICD-10-CM | POA: Diagnosis not present

## 2020-04-07 NOTE — Telephone Encounter (Signed)
Patients wife called to ask how the head was held during the CEA surgery. Explained about supporting the head so it was not floating and then turning the neck to expose the surgical area. Wife reports that patient currently has a staph infection by his ear and around his head. They saw PCP for diagnosis and have medication for it. She denied further questions or concerns. Instructed to call back if needed. She verbalized understanding.

## 2020-04-13 DIAGNOSIS — G4733 Obstructive sleep apnea (adult) (pediatric): Secondary | ICD-10-CM | POA: Diagnosis not present

## 2020-04-14 DIAGNOSIS — E119 Type 2 diabetes mellitus without complications: Secondary | ICD-10-CM | POA: Diagnosis not present

## 2020-04-14 DIAGNOSIS — H5203 Hypermetropia, bilateral: Secondary | ICD-10-CM | POA: Diagnosis not present

## 2020-04-14 DIAGNOSIS — H34232 Retinal artery branch occlusion, left eye: Secondary | ICD-10-CM | POA: Diagnosis not present

## 2020-04-14 DIAGNOSIS — H35033 Hypertensive retinopathy, bilateral: Secondary | ICD-10-CM | POA: Diagnosis not present

## 2020-04-14 DIAGNOSIS — I10 Essential (primary) hypertension: Secondary | ICD-10-CM | POA: Diagnosis not present

## 2020-04-18 DIAGNOSIS — E119 Type 2 diabetes mellitus without complications: Secondary | ICD-10-CM | POA: Diagnosis not present

## 2020-04-18 DIAGNOSIS — H35033 Hypertensive retinopathy, bilateral: Secondary | ICD-10-CM | POA: Diagnosis not present

## 2020-04-18 DIAGNOSIS — H34232 Retinal artery branch occlusion, left eye: Secondary | ICD-10-CM | POA: Diagnosis not present

## 2020-04-20 ENCOUNTER — Other Ambulatory Visit: Payer: Self-pay

## 2020-04-20 ENCOUNTER — Encounter: Payer: Self-pay | Admitting: Diagnostic Neuroimaging

## 2020-04-20 ENCOUNTER — Ambulatory Visit: Payer: Medicare PPO | Admitting: Diagnostic Neuroimaging

## 2020-04-20 VITALS — BP 109/65 | HR 60 | Ht 69.0 in | Wt 182.2 lb

## 2020-04-20 DIAGNOSIS — I480 Paroxysmal atrial fibrillation: Secondary | ICD-10-CM | POA: Diagnosis not present

## 2020-04-20 DIAGNOSIS — E119 Type 2 diabetes mellitus without complications: Secondary | ICD-10-CM

## 2020-04-20 DIAGNOSIS — I6310 Cerebral infarction due to embolism of unspecified precerebral artery: Secondary | ICD-10-CM | POA: Diagnosis not present

## 2020-04-20 DIAGNOSIS — R413 Other amnesia: Secondary | ICD-10-CM

## 2020-04-20 DIAGNOSIS — I63239 Cerebral infarction due to unspecified occlusion or stenosis of unspecified carotid arteries: Secondary | ICD-10-CM

## 2020-04-20 DIAGNOSIS — I10 Essential (primary) hypertension: Secondary | ICD-10-CM | POA: Diagnosis not present

## 2020-04-20 NOTE — Patient Instructions (Addendum)
STROKE (left frontal, left occipital) - continue aspirin 81mg , diabetes control, BP control, statin; follow up sleep apnea treatment - continue monitoring neck hematoma per vascular surgery  Atrial fibrillation - may need to consider anticoagulation for atrial fibrillation in future; mention of in hospital atrial fibrillation (see note 03/15/20, Dr. Wyline Copas); follow up with cardiology  MEMORY LOSS (vascular, neurodegenerative, etoh) - may consider to stop memantine, donepezil (minimal benefit) - safety / supervision issues reviewed - caregiver resources provided

## 2020-04-20 NOTE — Progress Notes (Signed)
[facility]  PATIENT: [name] [name] DOB: 1945-10-17  REFERRING CLINICIAN: Humphrey [name] HISTORY FROM: patient and daughter  REASON FOR VISIT: follow up    HISTORICAL  CHIEF COMPLAINT:  Chief Complaint  Patient presents with  . Cerebrovascular Accident    rm [place], dgtr- [name] [name] "still very fatigued"    HISTORY OF PRESENT ILLNESS:   UPDATE (04/20/20, [facility]): Since last visit, admitted for fall and aphasia. Found to have left frontal and left occipital punctate acute infarcts. Had paroxymal afib and left ICA stenosis. S/p left CEA, complicated by neck hematoma. Since then, continues with fatigue.   PRIOR HPI (12/28/19): 74 year old male here for evaluation of memory loss, gait difficulty.  For past 1 year patient has had short-term memory loss, confusion, stiffness and gait, shuffling steps.  He is also been diagnosed with severe obstructive sleep apnea but cannot tolerate CPAP.  Patient still able to maintain his ADLs.  Symptoms mainly noted by patient's wife.  CT head and lab testing have been unremarkable.  Patient referred here for further evaluation.   REVIEW OF SYSTEMS: Full 14 system review of systems performed and negative with exception of: as per HPI .   ALLERGIES: No Known Allergies  HOME MEDICATIONS: Outpatient Medications Prior to Visit  Medication Sig Dispense Refill  . acetaminophen (TYLENOL) 500 MG tablet Take 500 mg by mouth every 6 (six) hours as needed for mild pain or headache.     . allopurinol (ZYLOPRIM) 100 MG tablet Take 200 mg by mouth daily.     Marland Kitchen aspirin EC 81 MG EC tablet Take 1 tablet (81 mg total) by mouth daily. Swallow whole. 30 tablet 11  . carvedilol (COREG) 25 MG tablet Take 1 tablet (25 mg total) by mouth 2 (two) times daily with a meal. 60 tablet 1  . citalopram (CELEXA) 20 MG tablet Take 20 mg by mouth daily.    . colestipol (COLESTID) 1 g tablet Take 1 g by mouth 2 (two) times daily between meals as needed (for diarrhea).      . donepezil (ARICEPT) 10 MG tablet Take 10 mg by mouth every evening.     . folic acid (FOLVITE) 1 MG tablet Take 1 tablet (1 mg total) by mouth daily. 30 tablet 0  . furosemide (LASIX) 20 MG tablet Take 20 mg by mouth daily as needed for edema.    Marland Kitchen glimepiride (AMARYL) 1 MG tablet Take 1 mg by mouth daily with breakfast.    . losartan (COZAAR) 100 MG tablet Take 1 tablet (100 mg total) by mouth daily. 30 tablet 01  . memantine (NAMENDA) 10 MG tablet Take 10 mg by mouth 2 (two) times daily.     . Multiple Vitamins-Minerals ([employer] SPECTRAVITE MEN 50+) TABS Take 1 tablet by mouth daily with breakfast.    . omeprazole (PRILOSEC) 40 MG capsule Take 40 mg by mouth daily before breakfast.    . rosuvastatin (CRESTOR) 20 MG tablet Take 20 mg by mouth daily.     . vitamin B-12 1000 MCG tablet Take 1 tablet (1,000 mcg total) by mouth daily. 30 tablet 1  . HYDROcodone-homatropine (HYCODAN) 5-1.5 MG/5ML syrup Take 5 mLs by mouth every 6 (six) hours as needed for cough.    Marland Kitchen amLODipine (NORVASC) 10 MG tablet Take 1 tablet (10 mg total) by mouth daily. (Patient not taking: Reported on 04/05/2020) 30 tablet 1  . hydrochlorothiazide (HYDRODIURIL) 50 MG tablet Take 1 tablet (50 mg total) by mouth daily. (Patient not  taking: Reported on 04/05/2020) 30 tablet 1  . tamsulosin (FLOMAX) 0.4 MG CAPS capsule Take 0.4 mg by mouth daily. (Patient not taking: Reported on 04/05/2020)     No facility-administered medications prior to visit.    PAST MEDICAL HISTORY: Past Medical History:  Diagnosis Date  . AAA (abdominal aortic aneurysm) ([place])   . Arthritis    Gout  . Back pain   . CKD (chronic kidney disease)   . Diabetes mellitus without complication ([place])    type 2  . Encephalopathy acute   . GERD (gastroesophageal reflux disease)   . Gout   . Gout   . Hyperlipidemia   . Hypertension   . Lung nodule, solitary 2008   RLL, 8 mm in 2008, 14 mm in 2014  . Memory change   . Polyarthralgia   . Reflux   .  Stroke ([place])    hx of multiple    PAST SURGICAL HISTORY: Past Surgical History:  Procedure Laterality Date  . ABDOMINAL AORTIC ENDOVASCULAR STENT GRAFT N/A 04/29/2013   Procedure: ABDOMINAL AORTIC ENDOVASCULAR STENT GRAFT -GORE;  Surgeon: Mal Misty, MD;  Location: [facility];  Service: Vascular;  Laterality: N/A;  . COLONOSCOPY  2010  . ENDARTERECTOMY Left 03/11/2020   Procedure: ENDARTERECTOMY CAROTID LEFT;  Surgeon: Marty Heck, MD;  Location: [facility];  Service: Vascular;  Laterality: Left;  . ENDARTERECTOMY Left 03/12/2020   Procedure: EVACUATION HEMATOMA  AND WASHOUT OUT LEFT NECK WITH DRAIN PLACEMENT;  Surgeon: Marty Heck, MD;  Location: [facility];  Service: Vascular;  Laterality: Left;  Marland Kitchen [place]  . PATCH ANGIOPLASTY Left 03/11/2020   Procedure: PATCH ANGIOPLASTY WITH Rueben Bash BIOLOGIC PATCH;  Surgeon: Marty Heck, MD;  Location: [facility];  Service: Vascular;  Laterality: Left;  . TIBIA FRACTURE SURGERY Left   . VASECTOMY    . WRIST FRACTURE SURGERY Left 1970    FAMILY HISTORY: Family History  Problem Relation Age of Onset  . Diabetes Father   . Stroke Father   . Heart attack Father   . Alcohol abuse Father   . Heart attack Brother        X's 2  . Diabetes Brother   . Hypertension Brother   . Alcohol abuse Brother   . Coronary artery disease Mother   . Diabetes Mother   . Heart attack Mother   . Hypertension Brother   . Alcohol abuse Brother   . Diabetes Sister   . Dementia Maternal Aunt   . Alcohol abuse Maternal Uncle   . Alcohol abuse Paternal Uncle   . Alcohol abuse Maternal Grandfather   . Alcohol abuse Paternal Grandfather     SOCIAL HISTORY: Social History   Socioeconomic History  . Marital status: Married    Spouse name: [name] [name]  . Number of children: 2  . Years of education: Not on file  . Highest education level: Bachelor's degree (e.g., BA, AB, BS)  Occupational History    Comment: retired  Tobacco Use    . Smoking status: Former Smoker    Packs/day: 1.00    Years: 20.00    Pack years: 20.00    Types: Cigarettes    Quit date: 07/03/1983    Years since quitting: 36.8  . Smokeless tobacco: Never Used  Substance and Sexual Activity  . Alcohol use: Not Currently    Alcohol/week: 12.0 standard drinks    Types: 12 Cans of beer per week    Comment: 04/20/20  none  08/06/18 18 pk/week  . Drug use: No  . Sexual activity: Not on file  Other Topics Concern  . Not on file  Social History Narrative   12/23/19 Lives with wife   Caffeine- coffee, 1 cup daily   Social Determinants of Health   Financial Resource Strain:   . Difficulty of Paying Living Expenses: Not on file  Food Insecurity:   . Worried About Charity fundraiser in the Last Year: Not on file  . Ran Out of Food in the Last Year: Not on file  Transportation Needs:   . Lack of Transportation (Medical): Not on file  . Lack of Transportation (Non-Medical): Not on file  Physical Activity:   . Days of Exercise per Week: Not on file  . Minutes of Exercise per Session: Not on file  Stress:   . Feeling of Stress : Not on file  Social Connections:   . Frequency of Communication with Friends and Family: Not on file  . Frequency of Social Gatherings with Friends and Family: Not on file  . Attends Religious Services: Not on file  . Active Member of Clubs or Organizations: Not on file  . Attends Archivist Meetings: Not on file  . Marital Status: Not on file  Intimate Partner Violence:   . Fear of Current or Ex-Partner: Not on file  . Emotionally Abused: Not on file  . Physically Abused: Not on file  . Sexually Abused: Not on file     PHYSICAL EXAM  GENERAL EXAM/CONSTITUTIONAL: Vitals:  Vitals:   04/20/20 1348  BP: 109/65  Pulse: 60  Weight: 182 lb 3.2 oz (82.6 kg)  Height: 5\' 9"  (1.753 m)   Body mass index is 26.91 kg/m. Wt Readings from Last 3 Encounters:  04/20/20 182 lb 3.2 oz (82.6 kg)  04/05/20 181 lb  (82.1 kg)  03/19/20 186 lb 11.2 oz (84.7 kg)    Patient is in no distress; well developed, nourished and groomed; neck is supple  CARDIOVASCULAR:  Examination of carotid arteries is normal; no carotid bruits  Regular rate and rhythm, no murmurs  Examination of peripheral vascular system by observation and palpation is normal  EYES:  Ophthalmoscopic exam of optic discs and posterior segments is normal; no papilledema or hemorrhages No exam data present  MUSCULOSKELETAL:  Gait, strength, tone, movements noted in Neurologic exam below  NEUROLOGIC: MENTAL STATUS:  MMSE - [place] Exam 08/06/2018  Orientation to time 5  Orientation to Place 5  Registration 3  Attention/ Calculation 1  Recall 3  Language- name 2 objects 2  Language- repeat 1  Language- follow 3 step command 3  Language- read & follow direction 1  Write a sentence 1  Copy design 1  Total score 26    awake, alert, oriented to person, place and time  recent and remote memory intact  normal attention and concentration  language fluent, comprehension intact, naming intact  fund of knowledge appropriate  CRANIAL NERVE:   2nd - no papilledema on fundoscopic exam  2nd, 3rd, 4th, 6th - pupils equal and reactive to light, visual fields full to confrontation, extraocular muscles intact, no nystagmus  5th - facial sensation symmetric  7th - facial strength symmetric  8th - hearing intact  9th - palate elevates symmetrically, uvula midline  11th - shoulder shrug symmetric  12th - tongue protrusion midline  MOTOR:   NO TREMOR  STIFFNESS AND SLOWNESS IN LUE (PRIOR INJURY / TRAUMA)  normal bulk and tone, full strength in the BUE, BLE  SENSORY:   normal and symmetric to light touch, temperature, vibration  COORDINATION:   finger-nose-finger, fine finger movements normal  REFLEXES:   deep tendon reflexes present and symmetric  GAIT/STATION:   narrow based gait; DECR ARM SWING;  FEET POINTED OUTWARD; MILDLY SLOW AND CAREFUL     DIAGNOSTIC DATA (LABS, IMAGING, TESTING) - I reviewed patient records, labs, notes, testing and imaging myself where available.  Lab Results  Component Value Date   WBC 8.8 03/22/2020   HGB 9.9 (L) 03/23/2020   HCT 31.2 (L) 03/23/2020   MCV 91.5 03/22/2020   PLT 269 03/22/2020      Component Value Date/Time   NA 138 03/22/2020 0119   K 3.4 (L) 03/22/2020 0119   CL 103 03/22/2020 0119   CO2 25 03/22/2020 0119   GLUCOSE 107 (H) 03/22/2020 0119   BUN 27 (H) 03/22/2020 0119   CREATININE 1.42 (H) 03/22/2020 0119   CALCIUM 9.4 03/22/2020 0119   PROT 5.7 (L) 03/15/2020 0445   ALBUMIN 2.8 (L) 03/15/2020 0445   AST 17 03/15/2020 0445   ALT 12 03/15/2020 0445   ALKPHOS 96 03/15/2020 0445   BILITOT 0.5 03/15/2020 0445   GFRNONAA 48 (L) 03/22/2020 0119   GFRAA 56 (L) 03/22/2020 0119   Lab Results  Component Value Date   CHOL 166 03/09/2020   HDL 50 03/09/2020   LDLCALC 95 03/09/2020   TRIG 144 03/14/2020   CHOLHDL 3.3 03/09/2020   Lab Results  Component Value Date   HGBA1C 6.8 (H) 03/09/2020   Lab Results  Component Value Date   VITAMINB12 203 03/09/2020   Lab Results  Component Value Date   TSH 0.820 03/09/2020   03/08/20 CT Head WO Contrast 1. No acute intracranial abnormality. No skull fracture. 2. Stable atrophy, chronic small vessel ischemia, and remote basal gangliar lacunar infarcts.  03/08/20 MRI Brain WO Contrast Small acute infarcts involving the left frontal and occipital lobes.Moderate cerebral atrophy and chronic microvascular ischemicchanges. Remote bilateral basal ganglia, thalamic and cerebellar Insults. Mild pansinus disease.  03/08/20 CT Angio Head and Neck W WO Contrast 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 30% luminal narrowing of the innominate secondary to atheromatous plaque. Approximately 50% narrowing of the left carotid bulb and 80-90% narrowing of the  proximal left ICA secondary to atheromatous plaque. Mild right V1 and V4 segment narrowing secondary to atheromatous disease. Mild narrowing of the proximal right petrous and left paraclinoid ICA.  03/08/20 MR Cervical +Thoracic Spine WO Contrast 1. Normal MRI of the cervicothoracic spinal cord. No cord signalchanges to suggest myelopathy or other abnormality. 2. Mild multilevel cervical spondylosis and facet arthrosis without significant spinal stenosis. Associated mild to moderate bilateral C4 through C6 foraminal narrowing as above. 3. No significant disc pathology within the thoracic spine for patient age. No thoracic spinal stenosis or neural impingement.  03/09/20 Echocardiogram Complete  1. Left ventricular ejection fraction, by estimation, is 55 to 60%. The left ventricle has normal function. The left ventricle has no regional wall motion abnormalities. Left ventricular diastolic parameters were  normal.  2. Right ventricular systolic function is normal. The right ventricular size is normal.  3. Left atrial size was mildly dilated.  4. The mitral valve is normal in structure. Mild mitral valve regurgitation. No evidence of mitral stenosis.  5. The aortic valve is tricuspid. Aortic valve regurgitation is mild. Mild aortic valve sclerosis is present, with no  evidence of aortic valve stenosis.  6. The inferior vena cava is normal in size with greater than 50%  respiratory variability, suggesting right atrial pressure of 3 mmHg.   03/09/20 Bilateral Carotid US  Right Carotid: The extracranial vessels were near-normal with only minimal wall thickening or plaque.  Left Carotid: Velocities in the left ICA are consistent with a 60-79% stenosis. PSV at the bulb level suggest 80-99% stenosis.   Stroke labs: Hemoglobin A1C: 6.8 Lipid panel: Total cholesterol 166, Triglycerides 105, HDL 50, LDL 95    ASSESSMENT AND PLAN  74 y.o. year old male here with short-term memory loss, decreased  attention, gait and balance difficulty since 2018. Now with new strokes in Sept 2021. Also s/o left CEA, with complication of neck hematoma.   Dx:  1. Cerebrovascular accident (CVA) due to embolism of precerebral artery ([place])   2. Memory loss   3. Paroxysmal atrial fibrillation (HCC)   4. Primary hypertension   5. Diabetes mellitus type II, non insulin dependent (H[facility])   6. Carotid stenosis, symptomatic, with infarction ([place])      PLAN:  STROKE (left frontal, left occipital) - continue aspirin 81mg , DM control, BP control, statin; follow up sleep apnea treatment - continue monitoring neck hematoma per vascular surgery  Atrial fibrillation - may need to consider anticoagulation for atrial fibrillation in future; mention of in hospital atrial fibrillation (see note 03/15/20, Dr. Wyline Copas); follow up with cardiology  MEMORY LOSS (vascular, neurodegenerative, etoh) - may consider to stop memantine, donepezil (minimal benefit) - safety / supervision issues reviewed - caregiver resources provided  Return for return to PCP.    Penni Bombard, MD 49/49/4473, 9:58 PM Certified in Neurology, Neurophysiology and Neuroimaging  [facility] [address], [place], [place] [address] [phone]

## 2020-04-21 DIAGNOSIS — Z6828 Body mass index (BMI) 28.0-28.9, adult: Secondary | ICD-10-CM | POA: Diagnosis not present

## 2020-04-21 DIAGNOSIS — N182 Chronic kidney disease, stage 2 (mild): Secondary | ICD-10-CM | POA: Diagnosis not present

## 2020-04-21 DIAGNOSIS — I10 Essential (primary) hypertension: Secondary | ICD-10-CM | POA: Diagnosis not present

## 2020-04-21 DIAGNOSIS — E1165 Type 2 diabetes mellitus with hyperglycemia: Secondary | ICD-10-CM | POA: Diagnosis not present

## 2020-04-21 DIAGNOSIS — R519 Headache, unspecified: Secondary | ICD-10-CM | POA: Diagnosis not present

## 2020-04-21 DIAGNOSIS — I480 Paroxysmal atrial fibrillation: Secondary | ICD-10-CM | POA: Diagnosis not present

## 2020-04-21 DIAGNOSIS — D649 Anemia, unspecified: Secondary | ICD-10-CM | POA: Diagnosis not present

## 2020-04-28 DIAGNOSIS — Z6828 Body mass index (BMI) 28.0-28.9, adult: Secondary | ICD-10-CM | POA: Diagnosis not present

## 2020-04-28 DIAGNOSIS — I10 Essential (primary) hypertension: Secondary | ICD-10-CM | POA: Diagnosis not present

## 2020-04-28 DIAGNOSIS — Z9889 Other specified postprocedural states: Secondary | ICD-10-CM | POA: Diagnosis not present

## 2020-04-29 ENCOUNTER — Encounter: Payer: Self-pay | Admitting: *Deleted

## 2020-04-29 ENCOUNTER — Encounter: Payer: Self-pay | Admitting: Cardiology

## 2020-05-09 DIAGNOSIS — F39 Unspecified mood [affective] disorder: Secondary | ICD-10-CM | POA: Insufficient documentation

## 2020-05-09 DIAGNOSIS — K219 Gastro-esophageal reflux disease without esophagitis: Secondary | ICD-10-CM | POA: Insufficient documentation

## 2020-05-09 DIAGNOSIS — N189 Chronic kidney disease, unspecified: Secondary | ICD-10-CM | POA: Insufficient documentation

## 2020-05-09 DIAGNOSIS — G9340 Encephalopathy, unspecified: Secondary | ICD-10-CM | POA: Insufficient documentation

## 2020-05-09 DIAGNOSIS — M109 Gout, unspecified: Secondary | ICD-10-CM | POA: Insufficient documentation

## 2020-05-09 DIAGNOSIS — N529 Male erectile dysfunction, unspecified: Secondary | ICD-10-CM | POA: Insufficient documentation

## 2020-05-09 DIAGNOSIS — M549 Dorsalgia, unspecified: Secondary | ICD-10-CM | POA: Insufficient documentation

## 2020-05-09 DIAGNOSIS — M2550 Pain in unspecified joint: Secondary | ICD-10-CM | POA: Insufficient documentation

## 2020-05-09 DIAGNOSIS — R413 Other amnesia: Secondary | ICD-10-CM | POA: Insufficient documentation

## 2020-05-09 DIAGNOSIS — M1990 Unspecified osteoarthritis, unspecified site: Secondary | ICD-10-CM | POA: Insufficient documentation

## 2020-05-10 ENCOUNTER — Other Ambulatory Visit: Payer: Self-pay

## 2020-05-10 ENCOUNTER — Encounter: Payer: Self-pay | Admitting: Vascular Surgery

## 2020-05-10 ENCOUNTER — Ambulatory Visit (INDEPENDENT_AMBULATORY_CARE_PROVIDER_SITE_OTHER): Payer: Self-pay | Admitting: Vascular Surgery

## 2020-05-10 VITALS — BP 150/82 | HR 59 | Temp 98.0°F | Resp 18 | Ht 69.0 in | Wt 193.0 lb

## 2020-05-10 DIAGNOSIS — I63239 Cerebral infarction due to unspecified occlusion or stenosis of unspecified carotid arteries: Secondary | ICD-10-CM

## 2020-05-10 NOTE — Progress Notes (Signed)
Patient name: [name] [name] MRN: [id number] DOB: 26-Feb-1946 Sex: male  REASON FOR VISIT: Ongoing postop check after left carotid endarterectomy  HPI: [name] [name] is a 74 y.o. male presents for 1 month follow-up and continued postop check after left carotid endarterectomy on 03/11/2020 for a symptomatic high-grade stenosis greater than 80%. Ultimately postop course was complicated by neck hematoma on postop day 1 and he had to go to the OR for evacuation of his neck hematoma on 03/12/2020 and drain placement.  He then had ongoing hypertensive emergency with re-accumulation of hematoma even with drain in place.  When he returned to the floor his blood pressures was 701 mm Hg systolic.  Ultimately had a prolonged stay in ICU on a Cleviprex drip while trying to titrate oral medications to get his blood pressure better controlled.    We have been following his neck hematoma as an outpatient. He remains on aspirin.  Also had new onset afib with RVR in the hospital and anticoagulation deferred given neck hematoma.    Feels the neck hematoma is almost completely resolved. Significant improvement since last visit.  Past Medical History:  Diagnosis Date  . AAA (abdominal aortic aneurysm) without rupture ([place]) 04/07/2013  . Abdominal aneurysm without mention of rupture 03/18/2012  . Acute ischemic stroke ([place]) 03/08/2020  . Acute respiratory failure with hypoxia ([place]) 03/16/2020  . Arthritis    Gout  . Back pain   . Carotid stenosis, symptomatic, with infarction ([place]) 03/16/2020  . CKD (chronic kidney disease)   . Diabetes mellitus type II, non insulin dependent ([place]) 03/08/2020  . ED (erectile dysfunction)   . Encephalopathy acute   . Episodic mood disorder ([place])   . 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 GERD (gastroesophageal reflux disease)   . Gout   . Hyperlipidemia   . Hypertension   . Hypertensive chronic kidney disease 06/12/2016  . Lung nodule, solitary 2008   RLL, 8 mm in 2008, 14 mm in  2014  . Memory change   . Polyarthralgia   . Reflux   . Renal insufficiency 03/08/2020  . Stroke ([place])    hx of multiple  . Syncope 06/12/2016    Past Surgical History:  Procedure Laterality Date  . ABDOMINAL AORTIC ENDOVASCULAR STENT GRAFT N/A 04/29/2013   Procedure: ABDOMINAL AORTIC ENDOVASCULAR STENT GRAFT -GORE;  Surgeon: Mal Misty, MD;  Location: [facility];  Service: Vascular;  Laterality: N/A;  . COLONOSCOPY  2010  . ENDARTERECTOMY Left 03/11/2020   Procedure: ENDARTERECTOMY CAROTID LEFT;  Surgeon: Marty Heck, MD;  Location: [facility];  Service: Vascular;  Laterality: Left;  . ENDARTERECTOMY Left 03/12/2020   Procedure: EVACUATION HEMATOMA  AND WASHOUT OUT LEFT NECK WITH DRAIN PLACEMENT;  Surgeon: Marty Heck, MD;  Location: [facility];  Service: Vascular;  Laterality: Left;  Marland Kitchen [place]  . PATCH ANGIOPLASTY Left 03/11/2020   Procedure: PATCH ANGIOPLASTY WITH Rueben Bash BIOLOGIC PATCH;  Surgeon: Marty Heck, MD;  Location: [facility];  Service: Vascular;  Laterality: Left;  . POLYPECTOMY     [place]  . TIBIA FRACTURE SURGERY Left   . VASECTOMY    . WRIST FRACTURE SURGERY Left 1970    Family History  Problem Relation Age of Onset  . Diabetes Father   . Stroke Father   . Heart attack Father   . Alcohol abuse Father   . Heart attack Brother        X's 2  . Diabetes Brother   .  Hypertension Brother   . Alcohol abuse Brother   . Coronary artery disease Mother   . Diabetes Mother   . Heart attack Mother   . Hypertension Brother   . Alcohol abuse Brother   . Diabetes Sister   . Dementia Maternal Aunt   . Alcohol abuse Maternal Uncle   . Alcohol abuse Paternal Uncle   . Alcohol abuse Maternal Grandfather   . Alcohol abuse Paternal Grandfather     SOCIAL HISTORY: Social History   Tobacco Use  . Smoking status: Former Smoker    Packs/day: 1.00    Years: 20.00    Pack years: 20.00    Types: Cigarettes    Quit date: 07/03/1983     Years since quitting: 36.8  . Smokeless tobacco: Never Used  Substance Use Topics  . Alcohol use: Not Currently    Comment: Rare, special occasion    No Known Allergies  Current Outpatient Medications  Medication Sig Dispense Refill  . acetaminophen (TYLENOL) 500 MG tablet Take 500 mg by mouth every 6 (six) hours as needed for mild pain or headache.     . allopurinol (ZYLOPRIM) 100 MG tablet Take 200 mg by mouth daily.     Marland Kitchen amLODipine (NORVASC) 5 MG tablet Take 5 mg by mouth daily.    Marland Kitchen aspirin EC 81 MG EC tablet Take 1 tablet (81 mg total) by mouth daily. Swallow whole. 30 tablet 11  . azelastine (ASTELIN) 0.1 % nasal spray Place 2 sprays into both nostrils 2 (two) times daily. Use in each nostril as directed    . carvedilol (COREG) 25 MG tablet Take 1 tablet (25 mg total) by mouth 2 (two) times daily with a meal. 60 tablet 1  . citalopram (CELEXA) 10 MG tablet Take 10 mg by mouth daily.     . colestipol (COLESTID) 1 g tablet Take 1 g by mouth 2 (two) times daily between meals as needed (for diarrhea).     . donepezil (ARICEPT) 10 MG tablet Take 10 mg by mouth every evening.     . furosemide (LASIX) 20 MG tablet Take 20 mg by mouth daily as needed for edema.    Marland Kitchen glimepiride (AMARYL) 1 MG tablet Take 1 mg by mouth daily with breakfast.    . losartan (COZAAR) 100 MG tablet Take 1 tablet (100 mg total) by mouth daily. 30 tablet 01  . memantine (NAMENDA) 10 MG tablet Take 10 mg by mouth 2 (two) times daily.     . Multiple Vitamins-Minerals ([employer]S SPECTRAVITE MEN 50+) TABS Take 1 tablet by mouth daily with breakfast.    . omeprazole (PRILOSEC) 40 MG capsule Take 40 mg by mouth daily before breakfast.    . rosuvastatin (CRESTOR) 20 MG tablet Take 20 mg by mouth daily.     . tamsulosin (FLOMAX) 0.4 MG CAPS capsule Take 0.4 mg by mouth daily.      No current facility-administered medications for this visit.    REVIEW OF SYSTEMS:  [X]  denotes positive finding, [ ]  denotes negative  finding Cardiac  Comments:  Chest pain or chest pressure:    Shortness of breath upon exertion:    Short of breath when lying flat:    Irregular heart rhythm:        Vascular    Pain in calf, thigh, or hip brought on by ambulation:    Pain in feet at night that wakes you up from your sleep:     Blood clot in your  veins:    Leg swelling:         Pulmonary    Oxygen at home:    Productive cough:     Wheezing:         Neurologic    Sudden weakness in arms or legs:     Sudden numbness in arms or legs:     Sudden onset of difficulty speaking or slurred speech:    Temporary loss of vision in one eye:     Problems with dizziness:         Gastrointestinal    Blood in stool:     Vomited blood:         Genitourinary    Burning when urinating:     Blood in urine:        Psychiatric    Major depression:         Hematologic    Bleeding problems:    Problems with blood clotting too easily:        Skin    Rashes or ulcers:        Constitutional    Fever or chills:      PHYSICAL EXAM: Vitals:   05/10/20 0910 05/10/20 0915  BP: (!) 159/83 (!) 150/82  Pulse: (!) 59 (!) 59  Resp: 18   Temp: 98 F (36.7 C)   TempSrc: Temporal   SpO2: 98%   Weight: 193 lb (87.5 kg)   Height: 5\' 9"  (1.753 m)     GENERAL: The patient is a well-nourished male, in no acute distress. The vital signs are documented above. VASCULAR:  Left neck hematoma resolved, incision completely healed except for some granulation tissue at inferior margin. MUSCULOSKELETAL: There are no major deformities or cyanosis. NEUROLOGIC: No focal weakness or paresthesias are detected.  Cranial nerve II through XII grossly intact  DATA:   None  Assessment/Plan:  74 year old male underwent left carotid endarterectomy on 03/11/2020 for symptomatic high-grade stenosis.  Postop course complicated by hypertensive emergency and ultimately a left neck hematoma requiring washout on 03/12/2020 with drain placement. He had  reaccumulation of neck hematoma even after washout that we have been monitoring as an outpatient. This is now completely resolved. He did have some granulation tissue at the inferior margin of the neck incision that we treated with silver nitrate today.  He has follow-up with his cardiologist Dr. Bettina Gavia soon regarding his atrial fibrillation. I think it would be okay to start him on anticoagulation if Dr. Bettina Gavia feels this is appropriate. I will have him follow-up with me in 9 months with carotid duplex for surveillance and will also get EVAR duplex given he has previously undergone EVAR with Dr. Kellie Simmering in 2014.    Marty Heck, MD [facility] Office: [phone]

## 2020-05-11 NOTE — Progress Notes (Deleted)
Cardiology Office Note:    Date:  05/11/2020   ID:  Sotirios Navarro, DOB 1946/04/22, MRN [id number]  PCP:  [name], MD  Cardiologist:  Shirlee More, MD   Referring MD: [name], MD  ASSESSMENT:    No diagnosis found. PLAN:    In order of problems listed above:  1. ***  Next appointment   Medication Adjustments/Labs and Tests Ordered: Current medicines are reviewed at length with the patient today.  Concerns regarding medicines are outlined above.  No orders of the defined types were placed in this encounter.  No orders of the defined types were placed in this encounter.    No chief complaint on file. ***  History of Present Illness:    Pauline Blaker is a 74 y.o. male who is being seen today for the evaluation of atrial fibrillation and advice regarding anticoagulation at the request of [name], MD.  Chart review neurology note 04/20/2020 indicates a history of paroxysmal atrial fibrillation status post left CEA and stroke.  Other history includes abdominal aortic aneurysm CKD and hypertension.  EKG 03/13/2020 shows atrial fibrillation with a controlled ventricular rate.  Chest x-ray 03/16/2020 showed improvement in bilateral lung opacities.  CT angio 03/08/2020 shows extensive atherosclerosis of aorta and branch vessels 50% narrowing the left carotid bulb at 80 to 90% narrowing the proximal left ICA and mild stenosis in the right ICA.  Following carotid endarterectomy he had postoperative respiratory failure with aspiration and atelectasis.  He also had hypertensive urgency treated with parenteral antihypertensive agents and managed by the hospitalist service at [facility] 03/09/2020 showed ejection fraction 55 to 60% normal left ventricular filling pressure mild left atrial enlargement mild aortic regurgitation.  Recent labs primary care 04/21/2020 showed mild anemia hemoglobin 11.3 platelets normal 232,000  CMP shows a potassium 4.4 creatinine 1.3 GFR greater than 60 cc his A1c is at target 6.4 Lipid profile 06/08/2019 cholesterol 159 LDL 78 triglycerides 163 HDL 48.   EKG 12/05/2019 shows sinus rhythm and is normal. Past Medical History:  Diagnosis Date  . AAA (abdominal aortic aneurysm) without rupture ([place]) 04/07/2013  . Abdominal aneurysm without mention of rupture 03/18/2012  . Acute ischemic stroke ([place]) 03/08/2020  . Acute respiratory failure with hypoxia ([place]) 03/16/2020  . Arthritis    Gout  . Back pain   . Carotid stenosis, symptomatic, with infarction ([place]) 03/16/2020  . CKD (chronic kidney disease)   . Diabetes mellitus type II, non insulin dependent ([place]) 03/08/2020  . ED (erectile dysfunction)   . Encephalopathy acute   . Episodic mood disorder ([place])   . 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 GERD (gastroesophageal reflux disease)   . Gout   . Hyperlipidemia   . Hypertension   . Hypertensive chronic kidney disease 06/12/2016  . Lung nodule, solitary 2008   RLL, 8 mm in 2008, 14 mm in 2014  . Memory change   . Polyarthralgia   . Reflux   . Renal insufficiency 03/08/2020  . Stroke ([place])    hx of multiple  . Syncope 06/12/2016    Past Surgical History:  Procedure Laterality Date  . ABDOMINAL AORTIC ENDOVASCULAR STENT GRAFT N/A 04/29/2013   Procedure: ABDOMINAL AORTIC ENDOVASCULAR STENT GRAFT -GORE;  Surgeon: Mal Misty, MD;  Location: [place];  Service: Vascular;  Laterality: N/A;  . COLONOSCOPY  2010  . ENDARTERECTOMY Left 03/11/2020   Procedure: ENDARTERECTOMY CAROTID LEFT;  Surgeon: Marty Heck,  MD;  Location: [place];  Service: Vascular;  Laterality: Left;  . ENDARTERECTOMY Left 03/12/2020   Procedure: EVACUATION HEMATOMA  AND WASHOUT OUT LEFT NECK WITH DRAIN PLACEMENT;  Surgeon: Marty Heck, MD;  Location: [place];  Service: Vascular;  Laterality: Left;  Marland Kitchen [place]  . PATCH ANGIOPLASTY Left 03/11/2020   Procedure: PATCH  ANGIOPLASTY WITH Rueben Bash BIOLOGIC PATCH;  Surgeon: Marty Heck, MD;  Location: [place];  Service: Vascular;  Laterality: Left;  . POLYPECTOMY     Colon  . TIBIA FRACTURE SURGERY Left   . VASECTOMY    . WRIST FRACTURE SURGERY Left 1970    Current Medications: No outpatient medications have been marked as taking for the 05/12/20 encounter (Appointment) with Richardo Priest, MD.     Allergies:   Patient has no known allergies.   Social History   Socioeconomic History  . Marital status: Married    Spouse name: [name]  . Number of children: 2  . Years of education: Not on file  . Highest education level: Bachelor's degree (e.g., BA, AB, BS)  Occupational History    Comment: retired  Tobacco Use  . Smoking status: Former Smoker    Packs/day: 1.00    Years: 20.00    Pack years: 20.00    Types: Cigarettes    Quit date: 07/03/1983    Years since quitting: 36.8  . Smokeless tobacco: Never Used  Substance and Sexual Activity  . Alcohol use: Not Currently    Comment: Rare, special occasion  . Drug use: No  . Sexual activity: Not on file  Other Topics Concern  . Not on file  Social History Narrative   12/23/19 Lives with wife   Caffeine- coffee, 1 cup daily   Social Determinants of Health   Financial Resource Strain:   . Difficulty of Paying Living Expenses: Not on file  Food Insecurity:   . Worried About Charity fundraiser in the Last Year: Not on file  . Ran Out of Food in the Last Year: Not on file  Transportation Needs:   . Lack of Transportation (Medical): Not on file  . Lack of Transportation (Non-Medical): Not on file  Physical Activity:   . Days of Exercise per Week: Not on file  . Minutes of Exercise per Session: Not on file  Stress:   . Feeling of Stress : Not on file  Social Connections:   . Frequency of Communication with Friends and Family: Not on file  . Frequency of Social Gatherings with Friends and Family: Not on file  . Attends Religious  Services: Not on file  . Active Member of Clubs or Organizations: Not on file  . Attends Archivist Meetings: Not on file  . Marital Status: Not on file     Family History: The patient's ***family history includes Alcohol abuse in his brother, brother, father, maternal grandfather, maternal uncle, paternal grandfather, and paternal uncle; Coronary artery disease in his mother; Dementia in his maternal aunt; Diabetes in his brother, father, mother, and sister; Heart attack in his brother, father, and mother; Hypertension in his brother and brother; Stroke in his father.  ROS:   ROS Please see the history of present illness.    *** All other systems reviewed and are negative.  EKGs/Labs/Other Studies Reviewed:    The following studies were reviewed today: ***  EKG:  EKG is *** ordered today.  The ekg ordered today is personally reviewed and  demonstrates ***  Recent Labs: 03/09/2020: TSH 0.820 03/15/2020: ALT 12 03/19/2020: Magnesium 1.8 03/22/2020: BUN 27; Creatinine, Ser 1.42; Platelets 269; Potassium 3.4; Sodium 138 03/23/2020: Hemoglobin 9.9  Recent Lipid Panel    Component Value Date/Time   CHOL 166 03/09/2020 0338   TRIG 144 03/14/2020 0318   HDL 50 03/09/2020 0338   CHOLHDL 3.3 03/09/2020 0338   VLDL 21 03/09/2020 0338   LDLCALC 95 03/09/2020 0338    Physical Exam:    VS:  There were no vitals taken for this visit.    Wt Readings from Last 3 Encounters:  05/10/20 193 lb (87.5 kg)  04/21/20 186 lb (84.4 kg)  04/20/20 182 lb 3.2 oz (82.6 kg)     GEN: *** Well nourished, well developed in no acute distress HEENT: Normal NECK: No JVD; No carotid bruits LYMPHATICS: No lymphadenopathy CARDIAC: ***RRR, no murmurs, rubs, gallops RESPIRATORY:  Clear to auscultation without rales, wheezing or rhonchi  ABDOMEN: Soft, non-tender, non-distended MUSCULOSKELETAL:  No edema; No deformity  SKIN: Warm and dry NEUROLOGIC:  Alert and oriented x 3 PSYCHIATRIC:  Normal  affect     Signed, Shirlee More, MD  05/11/2020 1:06 PM    [facility]

## 2020-05-12 ENCOUNTER — Encounter: Payer: Self-pay | Admitting: Cardiology

## 2020-05-12 ENCOUNTER — Other Ambulatory Visit: Payer: Self-pay

## 2020-05-12 ENCOUNTER — Ambulatory Visit: Payer: Medicare PPO | Admitting: Cardiology

## 2020-05-12 ENCOUNTER — Ambulatory Visit (INDEPENDENT_AMBULATORY_CARE_PROVIDER_SITE_OTHER): Payer: Medicare PPO

## 2020-05-12 VITALS — BP 130/65 | HR 70 | Ht 69.0 in | Wt 194.6 lb

## 2020-05-12 DIAGNOSIS — I480 Paroxysmal atrial fibrillation: Secondary | ICD-10-CM | POA: Diagnosis not present

## 2020-05-12 DIAGNOSIS — I6310 Cerebral infarction due to embolism of unspecified precerebral artery: Secondary | ICD-10-CM | POA: Diagnosis not present

## 2020-05-12 DIAGNOSIS — E782 Mixed hyperlipidemia: Secondary | ICD-10-CM | POA: Diagnosis not present

## 2020-05-12 DIAGNOSIS — I639 Cerebral infarction, unspecified: Secondary | ICD-10-CM

## 2020-05-12 DIAGNOSIS — I10 Essential (primary) hypertension: Secondary | ICD-10-CM

## 2020-05-12 NOTE — Patient Instructions (Addendum)
Medication Instructions:  Your physician recommends that you continue on your current medications as directed. Please refer to the Current Medication list given to you today.  *If you need a refill on your cardiac medications before your next appointment, please call your pharmacy*   Lab Work: None If you have labs (blood work) drawn today and your tests are completely normal, you will receive your results only by: Marland Kitchen MyChart Message (if you have MyChart) OR . A paper copy in the mail If you have any lab test that is abnormal or we need to change your treatment, we will call you to review the results.   Testing/Procedures: A zio monitor was ordered today. It will remain on for 7 days. You will then return monitor and event diary in provided box. It takes 1-2 weeks for report to be downloaded and returned to [place]. We will call you with the results. If monitor falls off or has orange flashing light, please call Zio for further instructions.      Follow-Up: At [facility], you and your health needs are our priority.  As part of our continuing mission to provide you with exceptional heart care, we have created designated Provider Care Teams.  These Care Teams include your primary Cardiologist (physician) and Advanced Practice Providers (APPs -  Physician Assistants and Nurse Practitioners) who all work together to provide you with the care you need, when you need it.  We recommend signing up for the patient portal called "MyChart".  Sign up information is provided on this After Visit Summary.  MyChart is used to connect with patients for Virtual Visits (Telemedicine).  Patients are able to view lab/test results, encounter notes, upcoming appointments, etc.  Non-urgent messages can be sent to your provider as well.   To learn more about what you can do with MyChart, go to [email or web address].    Your next appointment:   6 week(s)  The format for your next appointment:   In  Person  Provider:   Shirlee More, MD   Other Instructions [email or web address]        FDA-cleared, clinical grade mobile EKG monitor: Jodelle Red is the most clinically-validated mobile EKG used by the world's leading cardiac care medical professionals With Basic service, know instantly if your heart rhythm is normal or if atrial fibrillation is detected, and email the last single EKG recording to yourself or your doctor Premium service, available for purchase through the Kardia app for $9.99 per month or $99 per year, includes unlimited history and storage of your EKG recordings, a monthly EKG summary report to share with your doctor, along with the ability to track your blood pressure, activity and weight Includes one KardiaMobile phone clip FREE SHIPPING: Standard delivery 1-3 business days. Orders placed by 11:00am PST will ship that afternoon. Otherwise, will ship next business day. All orders ship via [employer] from [place], [place]

## 2020-05-12 NOTE — Progress Notes (Signed)
Cardiology Office Note:    Date:  05/12/2020   ID:  [name] [name], DOB [birth year], MRN [id number]  PCP:  [name] Flow, MD  Cardiologist:  [name] More, MD    Referring MD: [name] Flow, MD    ASSESSMENT:    1. PAF (paroxysmal atrial fibrillation) ([place])   2. Cerebrovascular accident (CVA) due to embolism of precerebral artery ([place])   3. Acute ischemic stroke ([place])   4. Mixed hyperlipidemia   5. Primary hypertension    PLAN:    In order of problems listed above:  1. He had documented atrial fibrillation has had stroke and MRI suggest that embolism is a mechanism.  I think long-term he should be anticoagulated and will touch base with the surgeon regarding timing.  Apply a 7-day ZIO monitor to assess A. fib burden and encouraged him to purchase the iPhone adapter and screen his rhythm at home.  I do not think he requires an antiarrhythmic drug I would continue his beta-blocker.  I contacted Dr. Carlis Abbott he prefers if we defer anticoagulation for 6 weeks and then take both aspirin and Eliquis however if the patient has evidence of atrial fibrillation recurrence we will move up the initiation of his anticoagulant. 2. He has made a good recovery after carotid endarterectomy continue medical therapy including antihypertensives and lipid-lowering 3. Stable continue his current lipid-lowering therapy 4. Stable continue current antihypertensives   Next appointment: 6 weeks   Medication Adjustments/Labs and Tests Ordered: Current medicines are reviewed at length with the patient today.  Concerns regarding medicines are outlined above.  Orders Placed This Encounter  Procedures  . LONG TERM MONITOR (3-14 DAYS)  . EKG 12-Lead   No orders of the defined types were placed in this encounter.   Chief Complaint  Patient presents with  . Atrial Fibrillation    History of Present Illness:   [name] [name] is a 74 y.o. male who is being seen today in consultation for the evaluation of atrial  fibrillation and advice regarding anticoagulation at the request of [name] Flow, MD.   Chart review neurology note 04/20/2020 indicates a history of paroxysmal atrial fibrillation status post left CEA and stroke.  Other history includes abdominal aortic aneurysm CKD and hypertension.  The note contains a statement to consider anticoagulation in the future when the patient was seen still recovering from carotid endarterectomy and neck hematoma.  A note from his vascular surgeon 04/05/2020 says that he would not recommend initiation of anticoagulation at that time as he felt that the risk exceeded benefit with the neck hematoma and said he would reevaluate in the patient seen back in his office in 1 month.  S   EKG 03/13/2020 shows atrial fibrillation with a controlled ventricular rate.   Chest x-ray 03/16/2020 showed improvement in bilateral lung opacities.   CT angio 03/08/2020 shows extensive atherosclerosis of aorta and branch vessels 50% narrowing the left carotid bulb at 80 to 90% narrowing the proximal left ICA and mild stenosis in the right ICA.   Following carotid endarterectomy he had postoperative respiratory failure with aspiration and atelectasis.  He also had hypertensive urgency treated with parenteral antihypertensive agents and managed by the hospitalist service at [facility] 03/09/2020 showed ejection fraction 55 to 60% normal left ventricular filling pressure mild left atrial enlargement mild aortic regurgitation.   Recent labs primary care 04/21/2020 showed mild anemia hemoglobin 11.3 platelets normal 232,000 CMP shows a potassium 4.4 creatinine  1.3 GFR greater than 60 cc his A1c is at target 6.4 Lipid profile 06/08/2019 cholesterol 159 LDL 78 triglycerides 163 HDL 48.   EKG 12/05/2019 shows sinus rhythm and is normal.  He has never had atrial fibrillation in the past and has had no awareness when it occurred.  His wife is a retired  Therapist, sports they are very worried about the hematoma he had after surgery their perspective was that he could not be anticoagulated I reviewed the vascular surgery note from this week saying it is an option if I thought it was necessary.  I discussed with him the benefits and risk of ongoing to do is send a secure chat to Dr. Carlis Abbott and go over the issue as I think he would benefit from transition from aspirin to Eliquis.  I have asked him to wear a 7-day ZIO monitor to see if he is having asymptomatic atrial fibrillation encouraged him to buy the iPhone adapter to screen his heart rhythm at home and capture symptomatic events.  He has no history of congenital rheumatic heart disease no chest pain shortness of breath palpitation or syncope and is me just about a full recovery from his stroke he just finds himself fatigued and visual loss. Past Medical History:  Diagnosis Date  . AAA (abdominal aortic aneurysm) without rupture ([place]) 04/07/2013  . Abdominal aneurysm without mention of rupture 03/18/2012  . Acute ischemic stroke ([place]) 03/08/2020  . Acute respiratory failure with hypoxia ([place]) 03/16/2020  . Arthritis    Gout  . Back pain   . Carotid stenosis, symptomatic, with infarction ([place]) 03/16/2020  . CKD (chronic kidney disease)   . Diabetes mellitus type II, non insulin dependent ([place]) 03/08/2020  . ED (erectile dysfunction)   . Encephalopathy acute   . Episodic mood disorder ([place])   . 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 GERD (gastroesophageal reflux disease)   . Gout   . Hyperlipidemia   . Hypertension   . Hypertensive chronic kidney disease 06/12/2016  . Lung nodule, solitary 2008   RLL, 8 mm in 2008, 14 mm in 2014  . Memory change   . Polyarthralgia   . Reflux   . Renal insufficiency 03/08/2020  . Stroke ([place])    hx of multiple  . Syncope 06/12/2016    Past Surgical History:  Procedure Laterality Date  . ABDOMINAL AORTIC ENDOVASCULAR STENT GRAFT N/A 04/29/2013   Procedure: ABDOMINAL  AORTIC ENDOVASCULAR STENT GRAFT -GORE;  Surgeon: Mal Misty, MD;  Location: [place];  Service: Vascular;  Laterality: N/A;  . COLONOSCOPY  2010  . ENDARTERECTOMY Left 03/11/2020   Procedure: ENDARTERECTOMY CAROTID LEFT;  Surgeon: Marty Heck, MD;  Location: [place];  Service: Vascular;  Laterality: Left;  . ENDARTERECTOMY Left 03/12/2020   Procedure: EVACUATION HEMATOMA  AND WASHOUT OUT LEFT NECK WITH DRAIN PLACEMENT;  Surgeon: Marty Heck, MD;  Location: [place];  Service: Vascular;  Laterality: Left;  Marland Kitchen [place]  . PATCH ANGIOPLASTY Left 03/11/2020   Procedure: PATCH ANGIOPLASTY WITH Rueben Bash BIOLOGIC PATCH;  Surgeon: Marty Heck, MD;  Location: [place];  Service: Vascular;  Laterality: Left;  . POLYPECTOMY     Colon  . TIBIA FRACTURE SURGERY Left   . VASECTOMY    . WRIST FRACTURE SURGERY Left 1970    Current Medications: Current Meds  Medication Sig  . allopurinol (ZYLOPRIM) 100 MG tablet Take 200 mg by mouth 2 (two) times daily.   Marland Kitchen aspirin  EC 81 MG EC tablet Take 1 tablet (81 mg total) by mouth daily. Swallow whole.  . carvedilol (COREG) 25 MG tablet Take 1 tablet (25 mg total) by mouth 2 (two) times daily with a meal. (Patient taking differently: Take 25 mg by mouth. Take 0.5 tablet ( 12.5 mg ) twice a day)  . citalopram (CELEXA) 20 MG tablet Take 20 mg by mouth daily.   . colestipol (COLESTID) 1 g tablet Take 1 g by mouth 2 (two) times daily between meals as needed (for diarrhea).   . Ferrous Sulfate (IRON) 325 (65 Fe) MG TABS Take by mouth. Take 0.5 tablet once daily  . furosemide (LASIX) 20 MG tablet Take 20 mg by mouth daily.   Marland Kitchen glimepiride (AMARYL) 1 MG tablet Take 1 mg by mouth daily with breakfast.  . losartan (COZAAR) 100 MG tablet Take 1 tablet (100 mg total) by mouth daily.  . Multiple Vitamins-Minerals ([employer] SPECTRAVITE MEN 50+) TABS Take 1 tablet by mouth daily with breakfast.  . omeprazole (PRILOSEC) 40 MG capsule Take 40 mg  by mouth daily before breakfast.  . rosuvastatin (CRESTOR) 20 MG tablet Take 20 mg by mouth daily.   . tamsulosin (FLOMAX) 0.4 MG CAPS capsule Take 0.4 mg by mouth daily.   . vitamin B-12 (CYANOCOBALAMIN) 1000 MCG tablet Take 1,000 mcg by mouth daily.  . [DISCONTINUED] acetaminophen (TYLENOL) 500 MG tablet Take 500 mg by mouth every 6 (six) hours as needed for mild pain or headache.      Allergies:   Patient has no known allergies.   Social History   Socioeconomic History  . Marital status: Married    Spouse name: [name]  . Number of children: 2  . Years of education: Not on file  . Highest education level: Bachelor's degree (e.g., BA, AB, BS)  Occupational History    Comment: retired  Tobacco Use  . Smoking status: Former Smoker    Packs/day: 1.00    Years: 20.00    Pack years: 20.00    Types: Cigarettes    Quit date: 07/03/1983    Years since quitting: 36.8  . Smokeless tobacco: Never Used  Substance and Sexual Activity  . Alcohol use: Not Currently    Comment: Rare, special occasion  . Drug use: No  . Sexual activity: Not on file  Other Topics Concern  . Not on file  Social History Narrative   12/23/19 Lives with wife   Caffeine- coffee, 1 cup daily   Social Determinants of Health   Financial Resource Strain:   . Difficulty of Paying Living Expenses: Not on file  Food Insecurity:   . Worried About Charity fundraiser in the Last Year: Not on file  . Ran Out of Food in the Last Year: Not on file  Transportation Needs:   . Lack of Transportation (Medical): Not on file  . Lack of Transportation (Non-Medical): Not on file  Physical Activity:   . Days of Exercise per Week: Not on file  . Minutes of Exercise per Session: Not on file  Stress:   . Feeling of Stress : Not on file  Social Connections:   . Frequency of Communication with Friends and Family: Not on file  . Frequency of Social Gatherings with Friends and Family: Not on file  . Attends Religious Services:  Not on file  . Active Member of Clubs or Organizations: Not on file  . Attends Archivist Meetings: Not on file  . Marital  Status: Not on file     Family History: The patient's family history includes Alcohol abuse in his brother, brother, father, maternal grandfather, maternal uncle, paternal grandfather, and paternal uncle; Coronary artery disease in his mother; Dementia in his maternal aunt; Diabetes in his brother, father, mother, and sister; Heart attack in his brother, father, and mother; Hypertension in his brother and brother; Stroke in his father. ROS:   Please see the history of present illness.    All other systems reviewed and are negative.  EKGs/Labs/Other Studies Reviewed:    The following studies were reviewed today:  EKG:  EKG ordered today and personally reviewed.  The ekg ordered today demonstrates sinus rhythm normal  Recent Labs: 03/09/2020: TSH 0.820 03/15/2020: ALT 12 03/19/2020: Magnesium 1.8 03/22/2020: BUN 27; Creatinine, Ser 1.42; Platelets 269; Potassium 3.4; Sodium 138 03/23/2020: Hemoglobin 9.9  Recent Lipid Panel    Component Value Date/Time   CHOL 166 03/09/2020 0338   TRIG 144 03/14/2020 0318   HDL 50 03/09/2020 0338   CHOLHDL 3.3 03/09/2020 0338   VLDL 21 03/09/2020 0338   LDLCALC 95 03/09/2020 0338    Physical Exam:    VS:  BP 130/65   Pulse 70   Ht 5\' 9"  (1.753 m)   Wt 194 lb 9.6 oz (88.3 kg)   SpO2 97%   BMI 28.74 kg/m     Wt Readings from Last 3 Encounters:  05/12/20 194 lb 9.6 oz (88.3 kg)  05/10/20 193 lb (87.5 kg)  04/21/20 186 lb (84.4 kg)     GEN:  Well nourished, well developed in no acute distress HEENT: Normal NECK: No JVD; No carotid bruits LYMPHATICS: No lymphadenopathy CARDIAC: Small residual hematoma left carotid RRR, no murmurs, rubs, gallops RESPIRATORY:  Clear to auscultation without rales, wheezing or rhonchi  ABDOMEN: Soft, non-tender, non-distended MUSCULOSKELETAL:  No edema; No deformity  SKIN: Warm  and dry NEUROLOGIC:  Alert and oriented x 3 PSYCHIATRIC:  Normal affect    Signed, [name] More, MD  05/12/2020 11:46 AM    [place]

## 2020-05-27 DIAGNOSIS — I480 Paroxysmal atrial fibrillation: Secondary | ICD-10-CM | POA: Diagnosis not present

## 2020-05-31 ENCOUNTER — Telehealth: Payer: Self-pay

## 2020-05-31 ENCOUNTER — Telehealth: Payer: Self-pay | Admitting: Diagnostic Neuroimaging

## 2020-05-31 NOTE — Telephone Encounter (Signed)
Pt.'s wife [name] called to inform us that pt. passed away 10-03-22. She also wanted Dr. Leta Baptist to know thanks for everything. He was a very nice doctor.

## 2020-05-31 NOTE — Telephone Encounter (Signed)
-----   Message from Richardo Priest, MD sent at 05/31/2020  4:21 PM EST ----- Good result no episodes of atrial fibrillation

## 2020-05-31 NOTE — Telephone Encounter (Signed)
Spoke with patients wife regarding results and recommendation. She lets me know that [name] [name] passed away on 10-02-22 morning in his sleep. She requests that I let Dr. Bettina Gavia know.   I will route this message to Dr. Bettina Gavia at this time.

## 2020-06-01 DEATH — deceased

## 2020-06-23 ENCOUNTER — Ambulatory Visit: Payer: Medicare PPO | Admitting: Cardiology
